# Patient Record
Sex: Male | Born: 1945 | Race: Black or African American | Hispanic: No | Smoking: Current some day smoker
Health system: Southern US, Community
[De-identification: ages and names within clinical notes are randomized; demographics above are authoritative.]

## PROBLEM LIST (undated history)

## (undated) DIAGNOSIS — D649 Anemia, unspecified: Secondary | ICD-10-CM

## (undated) DIAGNOSIS — IMO0002 Reserved for concepts with insufficient information to code with codable children: Secondary | ICD-10-CM

## (undated) DIAGNOSIS — Z87891 Personal history of nicotine dependence: Secondary | ICD-10-CM

## (undated) DIAGNOSIS — I10 Essential (primary) hypertension: Secondary | ICD-10-CM

## (undated) DIAGNOSIS — C3410 Malignant neoplasm of upper lobe, unspecified bronchus or lung: Secondary | ICD-10-CM

## (undated) DIAGNOSIS — J850 Gangrene and necrosis of lung: Secondary | ICD-10-CM

## (undated) DIAGNOSIS — K635 Polyp of colon: Secondary | ICD-10-CM

## (undated) DIAGNOSIS — G5603 Carpal tunnel syndrome, bilateral upper limbs: Secondary | ICD-10-CM

## (undated) DIAGNOSIS — F1011 Alcohol abuse, in remission: Secondary | ICD-10-CM

## (undated) DIAGNOSIS — R112 Nausea with vomiting, unspecified: Secondary | ICD-10-CM

## (undated) DIAGNOSIS — B449 Aspergillosis, unspecified: Secondary | ICD-10-CM

## (undated) DIAGNOSIS — J449 Chronic obstructive pulmonary disease, unspecified: Secondary | ICD-10-CM

## (undated) DIAGNOSIS — Z9889 Other specified postprocedural states: Secondary | ICD-10-CM

## (undated) DIAGNOSIS — K859 Acute pancreatitis without necrosis or infection, unspecified: Secondary | ICD-10-CM

## (undated) DIAGNOSIS — C61 Malignant neoplasm of prostate: Secondary | ICD-10-CM

## (undated) DIAGNOSIS — K219 Gastro-esophageal reflux disease without esophagitis: Secondary | ICD-10-CM

## (undated) DIAGNOSIS — I739 Peripheral vascular disease, unspecified: Secondary | ICD-10-CM

## (undated) HISTORY — DX: Chronic obstructive pulmonary disease, unspecified: J44.9

## (undated) HISTORY — DX: Peripheral vascular disease, unspecified: I73.9

## (undated) HISTORY — PX: LUNG SURGERY: SHX703

## (undated) HISTORY — PX: FOOT SURGERY: SHX648

## (undated) HISTORY — PX: OTHER SURGICAL HISTORY: SHX169

## (undated) HISTORY — PX: ANKLE ARTHROPLASTY: SUR68

## (undated) HISTORY — DX: Personal history of nicotine dependence: Z87.891

## (undated) HISTORY — PX: ANTERIOR CERVICAL DECOMP/DISCECTOMY FUSION: SHX1161

## (undated) HISTORY — DX: Polyp of colon: K63.5

## (undated) HISTORY — DX: Alcohol abuse, in remission: F10.11

## (undated) HISTORY — DX: Acute pancreatitis without necrosis or infection, unspecified: K85.90

## (undated) HISTORY — PX: VASCULAR SURGERY: SHX849

## (undated) HISTORY — PX: CARDIAC CATHETERIZATION: SHX172

## (undated) HISTORY — DX: Reserved for concepts with insufficient information to code with codable children: IMO0002

## (undated) HISTORY — DX: Essential (primary) hypertension: I10

---

## 1997-05-13 ENCOUNTER — Inpatient Hospital Stay (HOSPITAL_COMMUNITY): Admission: RE | Admit: 1997-05-13 | Discharge: 1997-05-14 | Payer: Self-pay | Admitting: Neurosurgery

## 1997-05-31 ENCOUNTER — Encounter: Admission: RE | Admit: 1997-05-31 | Discharge: 1997-05-31 | Payer: Self-pay | Admitting: Hematology and Oncology

## 1997-11-26 ENCOUNTER — Inpatient Hospital Stay (HOSPITAL_COMMUNITY): Admission: EM | Admit: 1997-11-26 | Discharge: 1997-11-28 | Payer: Self-pay | Admitting: Emergency Medicine

## 1997-11-26 ENCOUNTER — Encounter: Payer: Self-pay | Admitting: Surgery

## 1997-11-26 ENCOUNTER — Encounter: Payer: Self-pay | Admitting: *Deleted

## 1997-11-30 ENCOUNTER — Encounter: Payer: Self-pay | Admitting: Surgery

## 1997-11-30 ENCOUNTER — Ambulatory Visit (HOSPITAL_COMMUNITY): Admission: RE | Admit: 1997-11-30 | Discharge: 1997-11-30 | Payer: Self-pay | Admitting: Surgery

## 1998-03-31 ENCOUNTER — Encounter: Admission: RE | Admit: 1998-03-31 | Discharge: 1998-05-01 | Payer: Self-pay | Admitting: Neurosurgery

## 1998-06-03 ENCOUNTER — Encounter: Payer: Self-pay | Admitting: Neurosurgery

## 1998-06-03 ENCOUNTER — Ambulatory Visit (HOSPITAL_COMMUNITY): Admission: RE | Admit: 1998-06-03 | Discharge: 1998-06-03 | Payer: Self-pay | Admitting: Neurosurgery

## 1998-09-04 ENCOUNTER — Encounter: Payer: Self-pay | Admitting: Surgery

## 1998-09-04 ENCOUNTER — Ambulatory Visit (HOSPITAL_COMMUNITY): Admission: RE | Admit: 1998-09-04 | Discharge: 1998-09-04 | Payer: Self-pay | Admitting: Surgery

## 1998-09-11 ENCOUNTER — Encounter: Payer: Self-pay | Admitting: Surgery

## 1998-09-11 ENCOUNTER — Ambulatory Visit (HOSPITAL_COMMUNITY): Admission: RE | Admit: 1998-09-11 | Discharge: 1998-09-11 | Payer: Self-pay | Admitting: Surgery

## 1998-11-15 ENCOUNTER — Encounter: Admission: RE | Admit: 1998-11-15 | Discharge: 1999-02-13 | Payer: Self-pay | Admitting: Neurosurgery

## 1999-07-31 ENCOUNTER — Encounter: Admission: RE | Admit: 1999-07-31 | Discharge: 1999-07-31 | Payer: Self-pay | Admitting: Neurosurgery

## 1999-07-31 ENCOUNTER — Encounter: Payer: Self-pay | Admitting: Neurosurgery

## 1999-10-19 ENCOUNTER — Encounter: Admission: RE | Admit: 1999-10-19 | Discharge: 1999-10-19 | Payer: Self-pay | Admitting: Gastroenterology

## 1999-10-19 ENCOUNTER — Encounter: Payer: Self-pay | Admitting: Gastroenterology

## 1999-11-27 ENCOUNTER — Encounter: Payer: Self-pay | Admitting: Cardiology

## 1999-11-27 ENCOUNTER — Encounter: Admission: RE | Admit: 1999-11-27 | Discharge: 1999-11-27 | Payer: Self-pay | Admitting: Cardiology

## 2000-02-19 ENCOUNTER — Encounter: Admission: RE | Admit: 2000-02-19 | Discharge: 2000-02-19 | Payer: Self-pay | Admitting: Neurosurgery

## 2000-02-19 ENCOUNTER — Encounter: Payer: Self-pay | Admitting: Neurosurgery

## 2000-02-28 ENCOUNTER — Ambulatory Visit (HOSPITAL_COMMUNITY): Admission: RE | Admit: 2000-02-28 | Discharge: 2000-02-28 | Payer: Self-pay | Admitting: Neurosurgery

## 2000-02-28 ENCOUNTER — Encounter: Payer: Self-pay | Admitting: Neurosurgery

## 2000-11-28 ENCOUNTER — Encounter: Admission: RE | Admit: 2000-11-28 | Discharge: 2000-11-28 | Payer: Self-pay | Admitting: Cardiology

## 2000-11-28 ENCOUNTER — Encounter: Payer: Self-pay | Admitting: Cardiology

## 2000-12-29 ENCOUNTER — Emergency Department (HOSPITAL_COMMUNITY): Admission: EM | Admit: 2000-12-29 | Discharge: 2000-12-29 | Payer: Self-pay | Admitting: Emergency Medicine

## 2001-02-23 ENCOUNTER — Ambulatory Visit (HOSPITAL_COMMUNITY): Admission: RE | Admit: 2001-02-23 | Discharge: 2001-02-23 | Payer: Self-pay | Admitting: Cardiology

## 2001-02-23 ENCOUNTER — Encounter: Payer: Self-pay | Admitting: Cardiology

## 2001-12-08 ENCOUNTER — Encounter (HOSPITAL_BASED_OUTPATIENT_CLINIC_OR_DEPARTMENT_OTHER): Payer: Self-pay | Admitting: General Surgery

## 2001-12-10 ENCOUNTER — Encounter (INDEPENDENT_AMBULATORY_CARE_PROVIDER_SITE_OTHER): Payer: Self-pay | Admitting: Specialist

## 2001-12-10 ENCOUNTER — Ambulatory Visit (HOSPITAL_COMMUNITY): Admission: RE | Admit: 2001-12-10 | Discharge: 2001-12-10 | Payer: Self-pay | Admitting: General Surgery

## 2001-12-20 HISTORY — PX: EPIGASTRIC HERNIA REPAIR: SUR1177

## 2002-03-07 ENCOUNTER — Encounter: Payer: Self-pay | Admitting: Neurosurgery

## 2002-03-07 ENCOUNTER — Ambulatory Visit (HOSPITAL_COMMUNITY): Admission: RE | Admit: 2002-03-07 | Discharge: 2002-03-07 | Payer: Self-pay | Admitting: Neurosurgery

## 2002-03-23 ENCOUNTER — Encounter: Payer: Self-pay | Admitting: Neurosurgery

## 2002-03-23 ENCOUNTER — Encounter: Admission: RE | Admit: 2002-03-23 | Discharge: 2002-03-23 | Payer: Self-pay | Admitting: Neurosurgery

## 2002-04-06 ENCOUNTER — Encounter: Payer: Self-pay | Admitting: Neurosurgery

## 2002-04-06 ENCOUNTER — Encounter: Admission: RE | Admit: 2002-04-06 | Discharge: 2002-04-06 | Payer: Self-pay | Admitting: Neurosurgery

## 2002-04-23 ENCOUNTER — Encounter: Payer: Self-pay | Admitting: Cardiovascular Disease

## 2002-04-23 ENCOUNTER — Ambulatory Visit (HOSPITAL_COMMUNITY): Admission: RE | Admit: 2002-04-23 | Discharge: 2002-04-23 | Payer: Self-pay | Admitting: Cardiovascular Disease

## 2002-06-09 ENCOUNTER — Encounter: Payer: Self-pay | Admitting: Emergency Medicine

## 2002-06-09 ENCOUNTER — Emergency Department (HOSPITAL_COMMUNITY): Admission: EM | Admit: 2002-06-09 | Discharge: 2002-06-10 | Payer: Self-pay | Admitting: Emergency Medicine

## 2002-11-29 ENCOUNTER — Emergency Department (HOSPITAL_COMMUNITY): Admission: EM | Admit: 2002-11-29 | Discharge: 2002-11-29 | Payer: Self-pay | Admitting: Emergency Medicine

## 2002-12-20 ENCOUNTER — Encounter: Admission: RE | Admit: 2002-12-20 | Discharge: 2002-12-20 | Payer: Self-pay | Admitting: Gastroenterology

## 2002-12-20 ENCOUNTER — Encounter: Admission: RE | Admit: 2002-12-20 | Discharge: 2002-12-20 | Payer: Self-pay | Admitting: Oncology

## 2002-12-24 ENCOUNTER — Encounter: Admission: RE | Admit: 2002-12-24 | Discharge: 2002-12-24 | Payer: Self-pay | Admitting: Oncology

## 2002-12-24 ENCOUNTER — Encounter: Admission: RE | Admit: 2002-12-24 | Discharge: 2002-12-24 | Payer: Self-pay | Admitting: Gastroenterology

## 2003-04-26 ENCOUNTER — Ambulatory Visit (HOSPITAL_COMMUNITY): Admission: RE | Admit: 2003-04-26 | Discharge: 2003-04-26 | Payer: Self-pay | Admitting: Cardiology

## 2003-05-10 ENCOUNTER — Encounter: Admission: RE | Admit: 2003-05-10 | Discharge: 2003-05-31 | Payer: Self-pay | Admitting: Cardiology

## 2003-05-19 ENCOUNTER — Emergency Department (HOSPITAL_COMMUNITY): Admission: EM | Admit: 2003-05-19 | Discharge: 2003-05-19 | Payer: Self-pay | Admitting: Emergency Medicine

## 2003-06-24 ENCOUNTER — Ambulatory Visit (HOSPITAL_COMMUNITY): Admission: RE | Admit: 2003-06-24 | Discharge: 2003-06-25 | Payer: Self-pay | Admitting: Surgery

## 2003-09-09 ENCOUNTER — Emergency Department (HOSPITAL_COMMUNITY): Admission: EM | Admit: 2003-09-09 | Discharge: 2003-09-10 | Payer: Self-pay | Admitting: Emergency Medicine

## 2003-12-19 ENCOUNTER — Encounter: Admission: RE | Admit: 2003-12-19 | Discharge: 2003-12-19 | Payer: Self-pay | Admitting: Surgery

## 2004-02-23 ENCOUNTER — Encounter: Admission: RE | Admit: 2004-02-23 | Discharge: 2004-02-23 | Payer: Self-pay | Admitting: Cardiology

## 2004-04-17 ENCOUNTER — Encounter: Admission: RE | Admit: 2004-04-17 | Discharge: 2004-04-17 | Payer: Self-pay | Admitting: Cardiology

## 2004-10-12 ENCOUNTER — Encounter: Admission: RE | Admit: 2004-10-12 | Discharge: 2004-10-12 | Payer: Self-pay | Admitting: Cardiology

## 2005-01-28 DIAGNOSIS — C3410 Malignant neoplasm of upper lobe, unspecified bronchus or lung: Secondary | ICD-10-CM

## 2005-01-28 HISTORY — DX: Malignant neoplasm of upper lobe, unspecified bronchus or lung: C34.10

## 2005-02-21 ENCOUNTER — Ambulatory Visit (HOSPITAL_BASED_OUTPATIENT_CLINIC_OR_DEPARTMENT_OTHER): Admission: RE | Admit: 2005-02-21 | Discharge: 2005-02-21 | Payer: Self-pay | Admitting: Cardiology

## 2005-02-24 ENCOUNTER — Ambulatory Visit: Payer: Self-pay | Admitting: Internal Medicine

## 2006-03-02 ENCOUNTER — Inpatient Hospital Stay (HOSPITAL_COMMUNITY): Admission: EM | Admit: 2006-03-02 | Discharge: 2006-03-04 | Payer: Self-pay | Admitting: Emergency Medicine

## 2006-04-30 ENCOUNTER — Encounter: Admission: RE | Admit: 2006-04-30 | Discharge: 2006-05-22 | Payer: Self-pay | Admitting: Orthopedic Surgery

## 2006-05-06 ENCOUNTER — Ambulatory Visit (HOSPITAL_BASED_OUTPATIENT_CLINIC_OR_DEPARTMENT_OTHER): Admission: RE | Admit: 2006-05-06 | Discharge: 2006-05-06 | Payer: Self-pay | Admitting: Orthopedic Surgery

## 2006-05-23 ENCOUNTER — Encounter: Admission: RE | Admit: 2006-05-23 | Discharge: 2006-07-16 | Payer: Self-pay | Admitting: Orthopedic Surgery

## 2006-07-31 ENCOUNTER — Encounter: Admission: RE | Admit: 2006-07-31 | Discharge: 2006-07-31 | Payer: Self-pay | Admitting: Cardiology

## 2006-08-20 ENCOUNTER — Ambulatory Visit: Payer: Self-pay | Admitting: Internal Medicine

## 2006-08-21 ENCOUNTER — Encounter: Admission: RE | Admit: 2006-08-21 | Discharge: 2006-08-21 | Payer: Self-pay | Admitting: Cardiology

## 2006-08-26 LAB — CBC WITH DIFFERENTIAL/PLATELET
Basophils Absolute: 0 10*3/uL (ref 0.0–0.1)
Eosinophils Absolute: 0.3 10*3/uL (ref 0.0–0.5)
HGB: 13.7 g/dL (ref 13.0–17.1)
LYMPH%: 42 % (ref 14.0–48.0)
MCH: 31.6 pg (ref 28.0–33.4)
MCV: 90.4 fL (ref 81.6–98.0)
MONO%: 11.7 % (ref 0.0–13.0)
NEUT#: 1.4 10*3/uL — ABNORMAL LOW (ref 1.5–6.5)
Platelets: 168 10*3/uL (ref 145–400)
RBC: 4.33 10*6/uL (ref 4.20–5.71)

## 2006-08-26 LAB — COMPREHENSIVE METABOLIC PANEL
Alkaline Phosphatase: 131 U/L — ABNORMAL HIGH (ref 39–117)
BUN: 13 mg/dL (ref 6–23)
Glucose, Bld: 69 mg/dL — ABNORMAL LOW (ref 70–99)
Total Bilirubin: 0.5 mg/dL (ref 0.3–1.2)

## 2006-08-26 LAB — LACTATE DEHYDROGENASE: LDH: 159 U/L (ref 94–250)

## 2006-08-29 ENCOUNTER — Ambulatory Visit (HOSPITAL_COMMUNITY): Admission: RE | Admit: 2006-08-29 | Discharge: 2006-08-29 | Payer: Self-pay | Admitting: Internal Medicine

## 2006-09-17 LAB — CBC WITH DIFFERENTIAL/PLATELET
Basophils Absolute: 0.1 10*3/uL (ref 0.0–0.1)
HCT: 34.7 % — ABNORMAL LOW (ref 38.7–49.9)
HGB: 12.4 g/dL — ABNORMAL LOW (ref 13.0–17.1)
MCH: 33.1 pg (ref 28.0–33.4)
MONO#: 1 10*3/uL — ABNORMAL HIGH (ref 0.1–0.9)
NEUT%: 37.9 % — ABNORMAL LOW (ref 40.0–75.0)
lymph#: 1.8 10*3/uL (ref 0.9–3.3)

## 2006-09-19 ENCOUNTER — Ambulatory Visit (HOSPITAL_COMMUNITY): Admission: RE | Admit: 2006-09-19 | Discharge: 2006-09-19 | Payer: Self-pay | Admitting: Internal Medicine

## 2006-10-07 ENCOUNTER — Ambulatory Visit: Payer: Self-pay | Admitting: Thoracic Surgery

## 2006-10-21 ENCOUNTER — Ambulatory Visit: Payer: Self-pay | Admitting: Internal Medicine

## 2006-10-24 ENCOUNTER — Encounter: Payer: Self-pay | Admitting: Thoracic Surgery

## 2006-10-24 ENCOUNTER — Ambulatory Visit: Payer: Self-pay | Admitting: Thoracic Surgery

## 2006-10-24 ENCOUNTER — Inpatient Hospital Stay (HOSPITAL_COMMUNITY): Admission: RE | Admit: 2006-10-24 | Discharge: 2006-10-29 | Payer: Self-pay | Admitting: Thoracic Surgery

## 2006-11-05 ENCOUNTER — Encounter: Admission: RE | Admit: 2006-11-05 | Discharge: 2006-11-05 | Payer: Self-pay | Admitting: Thoracic Surgery

## 2006-11-05 ENCOUNTER — Ambulatory Visit: Payer: Self-pay | Admitting: Thoracic Surgery

## 2006-11-25 LAB — COMPREHENSIVE METABOLIC PANEL
Albumin: 4.1 g/dL (ref 3.5–5.2)
Alkaline Phosphatase: 118 U/L — ABNORMAL HIGH (ref 39–117)
BUN: 16 mg/dL (ref 6–23)
Glucose, Bld: 93 mg/dL (ref 70–99)
Total Bilirubin: 0.4 mg/dL (ref 0.3–1.2)

## 2006-11-25 LAB — CBC WITH DIFFERENTIAL/PLATELET
Basophils Absolute: 0 10*3/uL (ref 0.0–0.1)
Eosinophils Absolute: 0.6 10*3/uL — ABNORMAL HIGH (ref 0.0–0.5)
HGB: 11.7 g/dL — ABNORMAL LOW (ref 13.0–17.1)
LYMPH%: 32.7 % (ref 14.0–48.0)
MCV: 88.6 fL (ref 81.6–98.0)
MONO%: 10.4 % (ref 0.0–13.0)
NEUT#: 2.6 10*3/uL (ref 1.5–6.5)
Platelets: 191 10*3/uL (ref 145–400)

## 2006-11-26 ENCOUNTER — Encounter: Admission: RE | Admit: 2006-11-26 | Discharge: 2006-11-26 | Payer: Self-pay | Admitting: Thoracic Surgery

## 2006-11-26 ENCOUNTER — Ambulatory Visit: Payer: Self-pay | Admitting: Thoracic Surgery

## 2007-01-20 ENCOUNTER — Ambulatory Visit: Payer: Self-pay | Admitting: Thoracic Surgery

## 2007-01-20 ENCOUNTER — Encounter: Admission: RE | Admit: 2007-01-20 | Discharge: 2007-01-20 | Payer: Self-pay | Admitting: Thoracic Surgery

## 2007-04-21 ENCOUNTER — Ambulatory Visit: Payer: Self-pay | Admitting: Thoracic Surgery

## 2007-04-21 ENCOUNTER — Encounter: Admission: RE | Admit: 2007-04-21 | Discharge: 2007-04-21 | Payer: Self-pay | Admitting: Thoracic Surgery

## 2007-07-22 ENCOUNTER — Encounter: Admission: RE | Admit: 2007-07-22 | Discharge: 2007-07-22 | Payer: Self-pay | Admitting: Thoracic Surgery

## 2007-10-07 ENCOUNTER — Ambulatory Visit: Payer: Self-pay | Admitting: Thoracic Surgery

## 2007-10-07 ENCOUNTER — Encounter: Admission: RE | Admit: 2007-10-07 | Discharge: 2007-10-07 | Payer: Self-pay | Admitting: Thoracic Surgery

## 2007-12-11 ENCOUNTER — Encounter: Admission: RE | Admit: 2007-12-11 | Discharge: 2007-12-11 | Payer: Self-pay | Admitting: Cardiology

## 2008-04-20 ENCOUNTER — Ambulatory Visit: Payer: Self-pay | Admitting: Thoracic Surgery

## 2008-04-20 ENCOUNTER — Encounter: Admission: RE | Admit: 2008-04-20 | Discharge: 2008-04-20 | Payer: Self-pay | Admitting: Thoracic Surgery

## 2008-11-16 ENCOUNTER — Encounter: Admission: RE | Admit: 2008-11-16 | Discharge: 2008-11-16 | Payer: Self-pay | Admitting: Thoracic Surgery

## 2008-11-16 ENCOUNTER — Ambulatory Visit: Payer: Self-pay | Admitting: Thoracic Surgery

## 2009-04-19 ENCOUNTER — Encounter: Admission: RE | Admit: 2009-04-19 | Discharge: 2009-04-19 | Payer: Self-pay | Admitting: Cardiology

## 2009-05-02 ENCOUNTER — Encounter (INDEPENDENT_AMBULATORY_CARE_PROVIDER_SITE_OTHER): Payer: Self-pay | Admitting: Cardiology

## 2009-05-02 ENCOUNTER — Ambulatory Visit: Payer: Self-pay | Admitting: Vascular Surgery

## 2009-05-02 ENCOUNTER — Ambulatory Visit (HOSPITAL_COMMUNITY): Admission: RE | Admit: 2009-05-02 | Discharge: 2009-05-02 | Payer: Self-pay | Admitting: Cardiology

## 2009-05-16 ENCOUNTER — Ambulatory Visit: Payer: Self-pay | Admitting: Thoracic Surgery

## 2009-05-16 ENCOUNTER — Encounter: Admission: RE | Admit: 2009-05-16 | Discharge: 2009-05-16 | Payer: Self-pay | Admitting: Thoracic Surgery

## 2009-11-17 ENCOUNTER — Encounter: Admission: RE | Admit: 2009-11-17 | Discharge: 2009-11-17 | Payer: Self-pay | Admitting: Thoracic Surgery

## 2009-11-17 ENCOUNTER — Ambulatory Visit: Payer: Self-pay | Admitting: Thoracic Surgery

## 2009-12-15 ENCOUNTER — Ambulatory Visit: Payer: Self-pay | Admitting: Vascular Surgery

## 2010-01-04 ENCOUNTER — Ambulatory Visit (HOSPITAL_COMMUNITY)
Admission: RE | Admit: 2010-01-04 | Discharge: 2010-01-04 | Payer: Self-pay | Source: Home / Self Care | Attending: Vascular Surgery | Admitting: Vascular Surgery

## 2010-01-12 ENCOUNTER — Ambulatory Visit: Payer: Self-pay | Admitting: Vascular Surgery

## 2010-02-17 ENCOUNTER — Encounter: Payer: Self-pay | Admitting: Oncology

## 2010-02-19 ENCOUNTER — Encounter: Payer: Self-pay | Admitting: Thoracic Surgery

## 2010-03-02 ENCOUNTER — Ambulatory Visit: Admit: 2010-03-02 | Payer: Self-pay | Admitting: Vascular Surgery

## 2010-03-02 ENCOUNTER — Ambulatory Visit: Payer: Self-pay | Admitting: Vascular Surgery

## 2010-04-03 ENCOUNTER — Emergency Department (HOSPITAL_COMMUNITY): Payer: Medicare Other

## 2010-04-03 ENCOUNTER — Emergency Department (HOSPITAL_COMMUNITY)
Admission: EM | Admit: 2010-04-03 | Discharge: 2010-04-03 | Discharge status: Against Medical Advice | Payer: Medicare Other | Attending: Emergency Medicine | Admitting: Emergency Medicine

## 2010-04-03 DIAGNOSIS — R079 Chest pain, unspecified: Secondary | ICD-10-CM | POA: Insufficient documentation

## 2010-04-03 LAB — CBC
HCT: 36.9 % — ABNORMAL LOW (ref 39.0–52.0)
Hemoglobin: 12.9 g/dL — ABNORMAL LOW (ref 13.0–17.0)
MCH: 31.2 pg (ref 26.0–34.0)
MCHC: 35 g/dL (ref 30.0–36.0)
MCV: 89.3 fL (ref 78.0–100.0)
Platelets: 82 K/uL — ABNORMAL LOW (ref 150–400)
RBC: 4.13 MIL/uL — ABNORMAL LOW (ref 4.22–5.81)
RDW: 15.2 % (ref 11.5–15.5)
WBC: 4.9 K/uL (ref 4.0–10.5)

## 2010-04-03 LAB — POCT CARDIAC MARKERS
CKMB, poc: <1 ng/mL — ABNORMAL LOW (ref 1.0–8.0)
Myoglobin, poc: 191 ng/mL (ref 12–200)
Troponin i, poc: <0.05 ng/mL (ref 0.00–0.09)

## 2010-04-03 LAB — DIFFERENTIAL
Basophils Absolute: 0 10*3/uL (ref 0.0–0.1)
Basophils Relative: 1 % (ref 0–1)
Eosinophils Absolute: 0.5 K/uL (ref 0.0–0.7)
Eosinophils Relative: 10 % — ABNORMAL HIGH (ref 0–5)
Lymphocytes Relative: 27 % (ref 12–46)
Lymphs Abs: 1.3 K/uL (ref 0.7–4.0)
Monocytes Absolute: 0.6 10*3/uL (ref 0.1–1.0)
Monocytes Relative: 13 % — ABNORMAL HIGH (ref 3–12)
Neutro Abs: 2.4 K/uL (ref 1.7–7.7)
Neutrophils Relative %: 49 % (ref 43–77)

## 2010-04-03 LAB — BASIC METABOLIC PANEL
CO2: 32 mEq/L (ref 19–32)
Calcium: 9.7 mg/dL (ref 8.4–10.5)
Chloride: 94 mEq/L — ABNORMAL LOW (ref 96–112)
Creatinine, Ser: 1.56 mg/dL — ABNORMAL HIGH (ref 0.4–1.5)
GFR calc Af Amer: 55 mL/min — ABNORMAL LOW (ref >60)
Glucose, Bld: 122 mg/dL — ABNORMAL HIGH (ref 70–99)
Sodium: 137 mEq/L (ref 135–145)

## 2010-04-03 LAB — BASIC METABOLIC PANEL WITH GFR
BUN: 12 mg/dL (ref 6–23)
GFR calc non Af Amer: 45 mL/min — ABNORMAL LOW (ref >60)
Potassium: 3.7 meq/L (ref 3.5–5.1)

## 2010-04-09 LAB — POCT I-STAT, CHEM 8
Calcium, Ion: 1.19 mmol/L (ref 1.12–1.32)
Chloride: 105 mEq/L (ref 96–112)
Creatinine, Ser: 1.8 mg/dL — ABNORMAL HIGH (ref 0.4–1.5)
Glucose, Bld: 98 mg/dL (ref 70–99)
HCT: 36 % — ABNORMAL LOW (ref 39.0–52.0)
Potassium: 4.4 mEq/L (ref 3.5–5.1)

## 2010-04-13 ENCOUNTER — Ambulatory Visit (INDEPENDENT_AMBULATORY_CARE_PROVIDER_SITE_OTHER): Payer: Medicare Other | Admitting: Vascular Surgery

## 2010-04-13 DIAGNOSIS — I70219 Atherosclerosis of native arteries of extremities with intermittent claudication, unspecified extremity: Secondary | ICD-10-CM

## 2010-04-16 ENCOUNTER — Other Ambulatory Visit: Payer: Self-pay | Admitting: Thoracic Surgery

## 2010-04-16 DIAGNOSIS — C3490 Malignant neoplasm of unspecified part of unspecified bronchus or lung: Secondary | ICD-10-CM

## 2010-04-16 NOTE — Assessment & Plan Note (Signed)
OFFICE VISIT  [name] [name], [name] [name] DOB:  1945-04-07                                       04/13/2010 ZOXWR#:[id number]  This gentleman is an established patient.  HISTORY OF PRESENT ILLNESS:  A 65 year old gentleman that I have previously taken to the angio suite and studied his right leg.  He unfortunately has vessels in his below the knee segment that are too small to be used for a successful bypass.  He has occluded right superficial femoral artery and he was going to consider the possibility of a possible right common femoral artery endarterectomy and extended profundoplasty to try to improve the flow to his collaterals. Unfortunately he is not a candidate for direct revascularization given the small target vessel.  At this point he feels his symptomatology is severe enough that he is not able to complete his activities of daily living.  He has very limited distance that he can walk before he gets claudication in his right greater than his left leg.  His past medical history, past surgical history, social history, family history, medications, allergies and review of systems are unchanged from previous.  PHYSICAL EXAMINATION:  Vital signs:  His blood pressure is 168/91, heart rate of 69, respirations were 12, satting 99%. General:  Well-developed, well-nourished, no apparent distress. HEENT:  Pupils were equal, round, reactive to light.  Extraocular movements were intact. Neck:  Supple without any nuchal rigidity.  No JVD.  Lungs:  Clear to auscultation bilaterally.  No rales, rhonchi or wheezing. Cardiac:  Regular rate and rhythm.  Normal S1-S2.  No murmurs, rubs, thrills or gallops. Vascular:  Easily palpable upper extremity pulses.  Carotids were palpable without any bruits.  Abdominal aorta could not appreciate a midline mass that was pulsatile.  His femorals were palpable, left greater than right but still palpable.  No popliteal or pedal  pulses were palpable on either side. Abdomen:  Soft abdomen, nontender, nondistended.  No guarding, no rebound, no hepatosplenomegaly. Musculoskeletal:  He has 5/5 strength at all extremities.  There is no frank gangrene or ulcers on either side.  No frank cyanosis. Neuro:  Cranial nerves II-XII were intact.  Sensation was intact grossly.  Motor strength was as listed above.  MEDICAL DECISION MAKING:  This is a 65 year old gentleman with bilateral peripheral arterial disease, intermittent claudication.  He is now at the point where he has lifestyle limiting claudication.  Actually he has had lifestyle limiting claudication for quite awhile now.  Unfortunately he had an elevated creatinine previously which prevented me from studying both legs.  On reviewing his iliac system I can tell that there are persistent disease throughout the iliac system and that in fact what this gentleman needs is an iliofemoral endarterectomy with possible stenting of his iliac system.  This is done as a hybrid procedure.  He will also need an extended profundoplasty as there appears to be a lesion extending down into the profunda.  As this is the main conduit feeding his leg this needs to be completed.  In regards to his left leg it needs to also be imaged and there is a good possibility there is extensive amount of disease also in his left leg mirroring the right side.  In fact, he may actually benefit from an aortobifemoral bypass to improve the inflow to both sides but upon brief discussion of this the  patient absolutely refuses any consideration of aortobifemoral.  He is at this point mainly symptomatic on his right leg though he does have occasional pain in his left leg so he would like to proceed with a more limited procedure at this point.  He has a followup appointment with Dr. Shana Chute on March 22 so we are going to try to arrange for a right iliofemoral endarterectomy, profundoplasty and possible  iliac artery stenting on April 18, pending Dr. Magda Kiel input.    Fransisco Hertz, MD Electronically Signed  BLC/MEDQ  D:  04/13/2010  T:  04/16/2010  Job:  [id number]

## 2010-05-10 ENCOUNTER — Other Ambulatory Visit (HOSPITAL_COMMUNITY): Payer: Medicare Other

## 2010-05-14 ENCOUNTER — Encounter (HOSPITAL_COMMUNITY)
Admission: RE | Admit: 2010-05-14 | Discharge: 2010-05-14 | Disposition: A | Discharge status: Home / Self Care | Payer: Medicare Other | Source: Ambulatory Visit | Attending: Vascular Surgery | Admitting: Vascular Surgery

## 2010-05-14 ENCOUNTER — Other Ambulatory Visit: Payer: Self-pay | Admitting: Vascular Surgery

## 2010-05-14 ENCOUNTER — Ambulatory Visit (HOSPITAL_COMMUNITY)
Admission: RE | Admit: 2010-05-14 | Discharge: 2010-05-14 | Disposition: A | Discharge status: Home / Self Care | Payer: Medicare Other | Source: Ambulatory Visit | Attending: Vascular Surgery | Admitting: Vascular Surgery

## 2010-05-14 DIAGNOSIS — J4489 Other specified chronic obstructive pulmonary disease: Secondary | ICD-10-CM | POA: Insufficient documentation

## 2010-05-14 DIAGNOSIS — J449 Chronic obstructive pulmonary disease, unspecified: Secondary | ICD-10-CM | POA: Insufficient documentation

## 2010-05-14 DIAGNOSIS — Z01811 Encounter for preprocedural respiratory examination: Secondary | ICD-10-CM | POA: Insufficient documentation

## 2010-05-14 DIAGNOSIS — I739 Peripheral vascular disease, unspecified: Secondary | ICD-10-CM

## 2010-05-14 DIAGNOSIS — R0602 Shortness of breath: Secondary | ICD-10-CM | POA: Insufficient documentation

## 2010-05-14 DIAGNOSIS — Z01810 Encounter for preprocedural cardiovascular examination: Secondary | ICD-10-CM | POA: Insufficient documentation

## 2010-05-14 DIAGNOSIS — Z01812 Encounter for preprocedural laboratory examination: Secondary | ICD-10-CM | POA: Insufficient documentation

## 2010-05-14 LAB — SURGICAL PCR SCREEN: Staphylococcus aureus: NEGATIVE

## 2010-05-14 LAB — URINALYSIS, ROUTINE W REFLEX MICROSCOPIC
Nitrite: NEGATIVE
Specific Gravity, Urine: 1.011 (ref 1.005–1.030)
Urobilinogen, UA: 0.2 mg/dL (ref 0.0–1.0)
pH: 6 (ref 5.0–8.0)

## 2010-05-14 LAB — COMPREHENSIVE METABOLIC PANEL
ALT: 19 U/L (ref 0–53)
AST: 52 U/L — ABNORMAL HIGH (ref 0–37)
Albumin: 3.9 g/dL (ref 3.5–5.2)
Alkaline Phosphatase: 83 U/L (ref 39–117)
Calcium: 9.5 mg/dL (ref 8.4–10.5)
GFR calc Af Amer: 56 mL/min — ABNORMAL LOW (ref >60)
Potassium: 4.1 mEq/L (ref 3.5–5.1)
Sodium: 137 mEq/L (ref 135–145)
Total Protein: 7.2 g/dL (ref 6.0–8.3)

## 2010-05-14 LAB — CBC
HCT: 33 % — ABNORMAL LOW (ref 39.0–52.0)
Hemoglobin: 11.8 g/dL — ABNORMAL LOW (ref 13.0–17.0)
MCH: 31.9 pg (ref 26.0–34.0)
MCHC: 35.8 g/dL (ref 30.0–36.0)
RDW: 15.3 % (ref 11.5–15.5)

## 2010-05-16 ENCOUNTER — Inpatient Hospital Stay (HOSPITAL_COMMUNITY): Payer: Medicare Other

## 2010-05-16 ENCOUNTER — Inpatient Hospital Stay (HOSPITAL_COMMUNITY)
Admission: RE | Admit: 2010-05-16 | Discharge: 2010-05-21 | DRG: 237 | Disposition: A | Discharge status: Home / Self Care | Payer: Medicare Other | Source: Ambulatory Visit | Attending: Vascular Surgery | Admitting: Vascular Surgery

## 2010-05-16 ENCOUNTER — Other Ambulatory Visit: Payer: Self-pay | Admitting: Vascular Surgery

## 2010-05-16 DIAGNOSIS — Y849 Medical procedure, unspecified as the cause of abnormal reaction of the patient, or of later complication, without mention of misadventure at the time of the procedure: Secondary | ICD-10-CM | POA: Diagnosis present

## 2010-05-16 DIAGNOSIS — H409 Unspecified glaucoma: Secondary | ICD-10-CM | POA: Diagnosis present

## 2010-05-16 DIAGNOSIS — Z23 Encounter for immunization: Secondary | ICD-10-CM

## 2010-05-16 DIAGNOSIS — J189 Pneumonia, unspecified organism: Secondary | ICD-10-CM | POA: Diagnosis not present

## 2010-05-16 DIAGNOSIS — I70219 Atherosclerosis of native arteries of extremities with intermittent claudication, unspecified extremity: Principal | ICD-10-CM | POA: Diagnosis present

## 2010-05-16 DIAGNOSIS — T82898A Other specified complication of vascular prosthetic devices, implants and grafts, initial encounter: Secondary | ICD-10-CM | POA: Diagnosis present

## 2010-05-16 DIAGNOSIS — Y9210 Unspecified residential institution as the place of occurrence of the external cause: Secondary | ICD-10-CM | POA: Diagnosis not present

## 2010-05-16 DIAGNOSIS — IMO0002 Reserved for concepts with insufficient information to code with codable children: Secondary | ICD-10-CM | POA: Diagnosis not present

## 2010-05-16 DIAGNOSIS — I10 Essential (primary) hypertension: Secondary | ICD-10-CM | POA: Diagnosis present

## 2010-05-16 DIAGNOSIS — Z79899 Other long term (current) drug therapy: Secondary | ICD-10-CM

## 2010-05-16 DIAGNOSIS — I2510 Atherosclerotic heart disease of native coronary artery without angina pectoris: Secondary | ICD-10-CM | POA: Diagnosis present

## 2010-05-16 DIAGNOSIS — D649 Anemia, unspecified: Secondary | ICD-10-CM | POA: Diagnosis not present

## 2010-05-16 DIAGNOSIS — Z7982 Long term (current) use of aspirin: Secondary | ICD-10-CM

## 2010-05-17 DIAGNOSIS — Z48812 Encounter for surgical aftercare following surgery on the circulatory system: Secondary | ICD-10-CM

## 2010-05-17 DIAGNOSIS — I739 Peripheral vascular disease, unspecified: Secondary | ICD-10-CM

## 2010-05-17 LAB — CBC
HCT: 21.6 % — ABNORMAL LOW (ref 39.0–52.0)
Hemoglobin: 7.5 g/dL — ABNORMAL LOW (ref 13.0–17.0)
MCH: 31.1 pg (ref 26.0–34.0)
MCV: 89.6 fL (ref 78.0–100.0)
RBC: 2.41 MIL/uL — ABNORMAL LOW (ref 4.22–5.81)

## 2010-05-17 LAB — BASIC METABOLIC PANEL
CO2: 26 mEq/L (ref 19–32)
Chloride: 109 mEq/L (ref 96–112)
Creatinine, Ser: 1.83 mg/dL — ABNORMAL HIGH (ref 0.4–1.5)
GFR calc Af Amer: 46 mL/min — ABNORMAL LOW (ref >60)
Glucose, Bld: 97 mg/dL (ref 70–99)

## 2010-05-18 ENCOUNTER — Inpatient Hospital Stay (HOSPITAL_COMMUNITY): Payer: Medicare Other

## 2010-05-18 LAB — CROSSMATCH
Antibody Screen: NEGATIVE
Unit division: 0

## 2010-05-18 LAB — URINALYSIS, ROUTINE W REFLEX MICROSCOPIC
Glucose, UA: NEGATIVE mg/dL
Specific Gravity, Urine: 1.007 (ref 1.005–1.030)
pH: 7 (ref 5.0–8.0)

## 2010-05-18 LAB — CBC
Platelets: 101 10*3/uL — ABNORMAL LOW (ref 150–400)
RBC: 3.13 MIL/uL — ABNORMAL LOW (ref 4.22–5.81)
WBC: 6.4 10*3/uL (ref 4.0–10.5)

## 2010-05-18 LAB — BASIC METABOLIC PANEL
Chloride: 110 mEq/L (ref 96–112)
GFR calc Af Amer: 54 mL/min — ABNORMAL LOW (ref >60)
Potassium: 3.6 mEq/L (ref 3.5–5.1)

## 2010-05-20 LAB — EXPECTORATED SPUTUM ASSESSMENT W GRAM STAIN, RFLX TO RESP C

## 2010-05-21 NOTE — Op Note (Signed)
NAME:  [name] [name], [name] [name]               ACCOUNT NO.:  [id number]  MEDICAL RECORD NO.:  [id number]           PATIENT TYPE:  I  LOCATION:  3310                         FACILITY:  [facility]  PHYSICIAN:  Fransisco Hertz, MD       DATE OF BIRTH:  1945/12/12  DATE OF PROCEDURE:  05/16/2010 DATE OF DISCHARGE:                              OPERATIVE REPORT   PROCEDURE: 1. Right iliofemoral endarterectomy with bovine patch angioplasty. 2. Right profundoplasty. 3. Right external iliac artery and common iliac artery angioplasty. 4. Right leg angiogram.  PREOPERATIVE DIAGNOSIS:  Severe lifestyle-limiting claudication.  POSTOPERATIVE DIAGNOSIS:  Severe lifestyle-limiting claudication.  SURGEON:  Fransisco Hertz, MD.  ASSISTANTS: 1. Pecola Leisure, PA-C 2. Newton Pigg, PA-C.  ANESTHESIA:  General.  FINDINGS: 1. 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 2. Greatly improved iliac and femoral flow on completion angiogram     with less than 30% in-stent restenosis. 3. There was a widely patent below-the-knee popliteal artery     reconstituted off of the profunda branches, which unfortunately is     only about 2-2.5 mm in diameter with intact trifurcation. 4. There is patent right peroneal and posterior tibial flow to the     foot on completion of the angiogram.  SPECIMEN:  Iliofemoral plaque, which was sent to Pathology.  ESTIMATED BLOOD LOSS:  600 mL.  FLUIDS:  2500 mL of crystalloid and 500 mL of Hespan.  INDICATIONS:  This is a 65 year old gentleman that had lifestyle limiting claudication.  It was severe enough that even short distances resulted in severe pain in bilateral calves, right greater than left.  I previously performed an angiogram on him and unfortunatelyhis distal  target vessels were so small that I think that any bypass would be unlikely  to be successful in the long-term.  The only thing I felt that may be  beneficial  to him was a possible extensive iliofemoral endarterectomy as he did evidence of disease extending into the profunda artery, which is his only conduit for feeding his lower leg.  I had suggested that I would possibly consider imaging his left leg to consider whether he needs in fact an aortobifemoral to improve inflow bilaterally, but he did not want this extensive of a procedure, so he agreed to proceed forward with a right iliofemoral endarterectomy with profundoplasty in a angioplasty on his iliac system on the right side.  He is aware of the risks of this procedure, included bleeding, infection, possible MI, possible stroke, possible death due to these two previous risks, possible development of renal failure secondary to the dye load, possible nerve damage and possible risk of embolization, possible risk of dissection.   He is aware of these risks and agreed to proceed forward with such.  DESCRIPTION OF OPERATION:  After full informed written consent was obtained from the patient, he was brought back to the operating room and placed supine upon the operating table.  Prior to induction, he had received IV antibiotics.  He was then prepped and draped in the standard  fashion for a right leg bypass procedure.  I turned my attention first to his right groin where I made a longitudinal incision from the inguinal ligament down to where I thought his femoral bifurcation was.  Using blunt  dissection and electrocautery, I dissected out the common femoral artery up to the external iliac artery underneath the inguinal ligament.  Note, there was extensive amount of scar tissue throughout, and his bifurcation was in fact much lower than I anticipated.  He has a very long common femoral artery.  I extended the incision, so I could dissect the femoral down to the profunda.  There was extensive amount of calcification throughout these vessels including the profunda arteries.  I  dissected out three branches of the profunda and then controlled them with vessel loops and also dissected out the SFA, which was controlled with a vessel loop.  Multiple circumflex side branches were dissected out and controlled with vessel loops.  There are also some large prominent collaterals that were dissected out and controlled with ties.  We carried the dissection proximally underneath the inguinal ligament and found a softer area in the distal external iliac artery.  I felt that at this point it could be clamped at this location.  The crossing vein that was present here was ligated to facilitate this process.  I gave the patient 5000 units of heparin intravenously, which was a therapeutic anticoagulation dose.  At this point, I waited 3 minutes and then we made an incision in the common femoral artery with an 11 blade.  The arteriotomy was  extended proximally and distally with Potts scissor.  We extended the proximal extent of this arteriotomy up to the external iliac artery and then distally was extended down to the femoral bifurcation.  There was a calcified plaque that was in then the majority of this patient's lumen. Using a Cytogeneticist, I dissected out the central portion of plaque and dissected it first proximally and then transected the midportion of this plaque to help the mobilization of this plaque.  We were able to dissect this plaque up to the point where the iliac artery softened.  I was able to extract most of this calcified plaque without any difficulties then carried the dissection down into the SFA and into the profunda artery.  I was able to extract a plaque out of the trifurcation of the profunda artery that removed most of this calcified lesion.  At this point, we spent the next 30 minutes dissecting out intimal flaps.  Note, there was extensive amount of debris that was densely adherent to the wall.  After removing this plaque, the wall was quite  thinned out, and I had concerns with extraction of some of these more densely adherent pieces of plaque that have damaged already attenuated wall.  So at this point, I re-interrogated from the external iliac all the way down to the femoral bifurcation, extracted some intimal flap from the superficial femoral artery.  There was a small amount of backbleeding that was quite limited from the SFA.  I also dissected down into the first order profunda branches.  I extended the arteriotomy down into this first order, opened it up completely, and extracted as much intimal flap was I felt I could safely extract.  There was still some densely adherent plaque present in one out of the three profunda branches.  Due to the small size, less than 2 mm of these branches and a very attenuated wall, I did not  feel it was safe to dissect further down into this artery.  We extracted as intimal flaps and also atherosclerotic plaque as I deemed safe,  I allowed all branches to back bleed and there was vigorous back bleeding from  all branches at this point of the case.  At this point, I irrigated out the  entire common femoral artery and distal external iliac artery one final time,  and there was no more loose flaps.  The entirety of this arteriotomy was then  patched with a bovine pericardial patch.  Due to the extreme length of this arteriotomy,  I had to cut the patch in half and sewed together two pieces.  I first sewed  the top half from the external iliac artery down to about mid common femoral  artery and then transected the distal end of this patch to facilitate joining  it with the distal end.  I then took the distal bovine pericardial patch and sewed it from the bifurcation of the first order profunda branches, sewed it along the SFA orifice and also the common femoral artery, and brought this patch angioplasty all the way onto the common femoral artery.  This was then sewn together to the previously  placed more proximal patch.  Prior to completing this anastomosis, I allowed all arteries to back bleed, and then I completed the patch angioplasty in the usual fashion and  released all clamps.  There was some bleeding from an injury to the external iliac artery that had to be repaired with a 6-0 Prolene, and I also found one area of injury near the bifurcation of the profunda branches that was repaired with a 7-0 Prolene.  Thrombin and Gelfoam were applied throughout this wound.  At this point, then I cannulated the midportion of this common femoral artery with a micropuncture needle and passed the microwire up into it.  I then performed some limited hand injections, which demonstrated the presence of the common iliac and external iliac stents in this patient, and it also demonstrated there was greater than 75% in-stent restenosis of the distal common iliac artery and also the majority of the external iliac artery.  At this point, I placed a J-wire through the micropuncture sheath and advanced it into the aorta.  The micropuncture sheath was then exchanged for a 7-French sheath.  I then obtained a 6 mm x 40 mm angioplasty balloon and used this to treat the in-stent restenosis along the entire length from the mid common iliac artery down into the patch portion of the artery.  Based on the size of the inflated balloon in the stent, the stent is in fact an 8-mm stent.  I completed a hand injection, which demonstrated still a greater the 30% in-stent restenosis, but greatly improved lumen.  I obtained a 7 mm x 40 mm angioplasty balloon and  then used it to angioplasty the same locations as previous from mid common  iliac artery all the way down into the patched artery.  The balloon was then  deflated and removed.  Completion hand injections demonstrated a less than  30% in-stent restenosis and improvement of the distal external iliac artery  stenosis.  I then completed completion right leg  angiograms.  This demonstrated widely patent profunda flow with also reconstitution of a below-the-knee popliteal via profunda branches and intact trifurcation. There was also patent right peroneal and posterior tibial blood flow on the completion angiogram, though there is not any in-line flow as expected given this  patient occluded SFA.  Also of note, the patient's below-the-knee popliteal artery is only about 2-2.5 mm in diameter.  At this point, then I placed an end-hole catheter over the J-wire and reconstituted the tip and removed the wire with the J-wire.  At this point, I repaired the hole caused by the 7-French sheath with an interrupted stitch of 6-0 Prolene.  There was no bleeding from this needle puncture and sheath site.  At this point, the patient was given a total of 30 mg of protamine, as this patient was given a total of 9000 units of heparin intravenously to maintain anticoagulation throughout this case.   I also placed thrombin and Gelfoam throughout this wound, and then some venous bleeders were controlled with titanium clips and also electrocautery. Eventually, we achieved adequate hemostasis except for some vasa vasorum bleeding, which I did not feel could be treated with electrocautery, so I left one single piece of thrombin and Gelfoam down over this area of vasa vasorum bleeding.  There was, however, no active arterial bleeding in this wound.  I also inspected the femoral nerve which was identified  adjacent to the profunda branches.  It was noted to be intact with no  obvious evidence of injury.  The wound was then reapproximated with a  double layer of 2- Vicryl in the deep layer then a double layer of 3-0 Vicryl  in the subcutaneous tissue, and then the skin was closed with running subcuticular 4-0 Monocryl, and then the skin closure reinforced with Dermabond.  COMPLICATIONS:  None.  CONDITION:  Stable.     Fransisco Hertz, MD     BLC/MEDQ  D:   05/16/2010  T:  05/17/2010  Job:  [id number]  Electronically Signed by Leonides Sake MD on 05/21/2010 10:33:59 AM

## 2010-05-22 ENCOUNTER — Ambulatory Visit: Payer: Medicare Other | Admitting: Thoracic Surgery

## 2010-05-22 ENCOUNTER — Other Ambulatory Visit: Payer: Medicare Other

## 2010-05-22 NOTE — Discharge Summary (Signed)
NAME:  [name] [name], [name] [name] NO.:  [id number]  MEDICAL RECORD NO.:  [id number]           PATIENT TYPE:  I  LOCATION:  2004                         FACILITY:  [facility]  PHYSICIAN:  Fransisco Hertz, MD       DATE OF BIRTH:  1946/01/26  DATE OF ADMISSION:  05/16/2010 DATE OF DISCHARGE:  05/21/2010                              DISCHARGE SUMMARY   ADMISSION DIAGNOSIS:  Right lower extremity claudication.  PAST MEDICAL HISTORY/DISCHARGE DIAGNOSES: 1. Right lower extremity life-limiting claudication, status post right     iliofemoral endarterectomy with bovine patch angioplasty, right     profundoplasty, right external iliac artery and common iliac artery     angioplasty, and right leg angiogram. 2. Glaucoma. 3. Peripheral arterial disease. 4. History of recurrent pancreatitis. 5. Postoperative anemia, status post transfusion, stable. 6. Postoperative pneumonia, currently on antibiotics.  BRIEF HISTORY OF PRESENT ILLNESS:  The patient is a 65 year old African American male who has been followed by Dr. Imogene Burn for life-limiting claudication. Right side is greater than left.  Dr. Imogene Burn had an arteriogram obtained and unfortunately his distal target was too small to handle a successful bypass.  Dr. Imogene Burn felt that the only thing that may be beneficial to the patient was extensive iliofemoral endarterectomy as he had evidence of disease extending into profunda which was his only conduit for feeding his lower leg.  The patient agreed to proceed.  HOSPITAL COURSE:  The patient was admitted and taken to the OR on May 16, 2010 for a right iliofemoral endarterectomy with bovine patch angioplasty, right profundoplasty, right external iliac and common iliac artery angioplasty, and right leg angiogram.  The patient tolerated the procedure well and was hemodynamically stable immediately postoperatively.  He was transferred from the OR to the Postanesthesia Care Unit in stable condition.   The patient was extubated without complication and woke up from anesthesia neurologically intact.  The patient's postoperative course was complicated by  anemia for which he was subsequently transfused with 2 units of packed RBCs.  He was also started on iron supplementation.  He tolerated this well and his hemoglobin/hematocrit responded appropriately.  The patient's Foley was discontinued and he was able to void without difficulty.  A physical therapy consult was obtained, however, the patient was ambulating well and there were no PT needs identified.  ABIs were performed on May 17, 2010 and this revealed 0.48 on the right and 0.60 on the left.  The ABIs were not surprising as the patient fundamentally does not have any in-line flow with his distal targets being very small.  The remainder of the patient's postoperative course progressed as expected with the exception of fevers that were noted on postoperative day #3.  Chest x-ray was obtained and this revealed possible early pneumonia.  The patient was started on IV Cipro.  This was subsequently changed to p.o. Levaquin for discharge home.  The patient's temperatures steadily declined.  On May 21, 2010, the patient was anxious to go home. VITAL SIGNS:  He was afebrile with stable vital signs. CARDIAC:  Regular rate and rhythm. LUNGS:  Clear to auscultation.  EXTREMITIES:  Right groin incision revealed minimal lateral erythema, minimal edema, and no drainage.  Bilateral lower extremities are warm.  The patient was afebrile, ambulating well, and without complaint.  He was felt stable for discharge home at that time on p.o. antibiotics.  LABORATORY DATA:  CBC on May 18, 2010, white count 6.4, hematocrit 9.8, hematocrit 27.7, and platelets 101.  BMP on May 18, 2010, sodium 140, potassium 3.6, BUN 13, and creatinine 1.58.  DISCHARGE INSTRUCTIONS:  The patient received specific written discharge instructions regarding diet,  activity, and wound care.  He will follow with Dr. Imogene Burn in 2 weeks.  The office will contact the patient with date and time of that appointment.  DISCHARGE INSTRUCTIONS: 1. Levaquin 750 mg 1 p.o. daily x10 days. 2. Aspirin 325 mg day. 3. Colace 0.6 mg b.i.d. 4. Cyclobenzaprine 10 mg t.i.d. p.r.n. 5. Ferrous sulfate 325 mg t.i.d. 6. Hydrocodone/acetaminophen 7.5/750 mg 1 q.6 p.r.n. pain. 7. Nitroglycerin sublingual 0.4 mg q.5 minutes p.r.n. up to the 3     doses. 8. ProAir 2 puffs inhaled q.i.d. p.r.n. 9. Travatan solution 0.004% 1 drop both eyes every night. 10.Tribenzor 20/5/12.5 mg 1 p.o. daily.     Pecola Leisure, PA   ______________________________ Fransisco Hertz, MD    AY/MEDQ  D:  05/21/2010  T:  05/21/2010  Job:  [id number]  Electronically Signed by Pecola Leisure PA on 05/22/2010 09:20:34 AM Electronically Signed by Leonides Sake MD on 05/22/2010 10:50:26 AM

## 2010-05-25 LAB — CULTURE, BLOOD (ROUTINE X 2)
Culture  Setup Time: 201204210440
Culture  Setup Time: 201204210440
Culture: NO GROWTH

## 2010-06-01 ENCOUNTER — Encounter (INDEPENDENT_AMBULATORY_CARE_PROVIDER_SITE_OTHER): Payer: Medicare Other

## 2010-06-01 ENCOUNTER — Ambulatory Visit (INDEPENDENT_AMBULATORY_CARE_PROVIDER_SITE_OTHER): Payer: Medicare Other | Admitting: Vascular Surgery

## 2010-06-01 DIAGNOSIS — I739 Peripheral vascular disease, unspecified: Secondary | ICD-10-CM

## 2010-06-01 DIAGNOSIS — I70219 Atherosclerosis of native arteries of extremities with intermittent claudication, unspecified extremity: Secondary | ICD-10-CM

## 2010-06-01 DIAGNOSIS — Z48812 Encounter for surgical aftercare following surgery on the circulatory system: Secondary | ICD-10-CM

## 2010-06-04 NOTE — Assessment & Plan Note (Signed)
OFFICE VISIT  [name] [name], [name] [name] DOB:  1945-02-23                                       06/01/2010 YNWGN#:[id number]  This is a postoperative followup.  HISTORY OF PRESENT ILLNESS:  This is a 65 year old gentleman with multiple comorbidities, including medical noncompliance, that presents for a follow-up from a right iliofemoral endarterectomy with bovine patch angioplasty, right profundoplasty, right external iliac artery and common iliac artery angioplasty.  He presents now about 2-3 weeks after his procedure.  He notes that he is able to ambulate with greatly decreased pain in the right side; however, his left side, he still has a significant symptomatology on this side.  At his incision he notes no drainage, still has a mild amount of numbness on his inner thigh.  PHYSICAL EXAMINATION:  He had a blood pressure of 127/83, heart rate of 67, respirations were 12, temperature was 98.0.  On targeted exam on his right groin, the incision is healing well, appropriately firm with no drainage.  Right foot is room temperature and not warm.  Both feet do not have palpable pulses.  On noninvasive vascular imaging, he had bilateral ABIs completed.  He had a 0.53, slightly up from previous 0.51.  On the left side, he is at 0.62, up slightly from 0.62.  All pedals are monophasic.  MEDICAL DECISION MAKING:  This is a 65 year old gentleman who is now status post a right iliofemoral endarterectomy, bovine patch angioplasty, profundoplasty, and an angioplasty of the external iliac artery and common iliac artery.  Based on the patient's previous angiograms, I am not surprised that ABIs are unchanged, as he has significant tibial disease.  I did not feel he had a distal target that was large enough to successfully complete a bypass.  Subsequently, to improve his inflow, I did the angioplasties on his iliac artery and also did the extensive endarterectomy to improve the  blood flow to his profunda, which was heavily calcified.  As I explained to him, there was an extensive amount of calcified disease within his arterial system.  So I once again emphasized to him the importance of medical management to help arrest the progression of atherosclerotic disease.  He is going to continue with his aspirin.  We are going to let him heal up for the next 3 months.  He has obtained some improved symptomatology just with increased inflow, and then in 3 months will repeat the study.  From that point, he is going to make a decision in regards whether or not he wants to make attempt at working on the left side.  I have emphasized to him that fundamentally he does not have in-line flow, and he needs to take care of his feet, as any injuries or infections to feet likely will result in amputation.  He is aware of this and will follow up with [place] in 3 months.    Fransisco Hertz, MD Electronically Signed  BLC/MEDQ  D:  06/01/2010  T:  06/04/2010  Job:  [id number]

## 2010-06-06 ENCOUNTER — Ambulatory Visit
Admission: RE | Admit: 2010-06-06 | Discharge: 2010-06-06 | Disposition: A | Discharge status: Home / Self Care | Payer: Medicare Other | Source: Ambulatory Visit | Attending: Thoracic Surgery | Admitting: Thoracic Surgery

## 2010-06-06 ENCOUNTER — Ambulatory Visit (INDEPENDENT_AMBULATORY_CARE_PROVIDER_SITE_OTHER): Payer: Medicare Other | Admitting: Thoracic Surgery

## 2010-06-06 DIAGNOSIS — C3490 Malignant neoplasm of unspecified part of unspecified bronchus or lung: Secondary | ICD-10-CM

## 2010-06-07 NOTE — Assessment & Plan Note (Signed)
OFFICE VISIT  [name] [name], [name] [name] DOB:  [birth year]                                        Jun 06, 2010 CHART #:  [id number]  The patient presents today.  He is now over 3-1/2 years since his surgery and his CT scan showed no evidence of recurrence.  His legs were stable.  He has been treated by Dr. Edilia Bo for that.  His blood pressure is 141/90, pulse 70, respirations 16.  I will see him back in 6 months with a CT scan and will probably refer him back to his medical doctor at that time.  Ines Bloomer, M.D. Electronically Signed  DPB/MEDQ  D:  06/06/2010  T:  06/07/2010  Job:  [id number]
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
## 2010-06-12 NOTE — Assessment & Plan Note (Signed)
OFFICE VISIT   Marianna, DUCRE  DOB:  [birth year]                                       01/12/2010  WUJWJ#:[id number]   This is an established patient.   HISTORY OF PRESENT ILLNESS:  This is a 65 year old gentleman who  recently underwent aortogram of his right leg.  Initially I planned to  image both legs but he had an increase of his creatinine to abnormal  level 1.8.  He was not aware of any chronic renal insufficiency prior to  this iSTAT lab.  I used CO2 and a limited amount of dye to image his  right leg as this was the greater complaint in this leg than his left  leg.  Based on the findings of that angiogram he has severe tibial  disease.  He also has an occluded superficial femoral artery with  significant common femoral artery disease.  Also he has narrowing in his  iliac system.  He has one dominant runoff vessel in his right calf,  however, this was measured on digital calipers to be less than 2 mm and  therefore not suitable for a bypass operation.  This gentleman is  continuing to smoke though he reduced his amount of cigarettes per day.  At this point he has lifestyle limiting claudication but no frank rest  pain to date and does not have any wounds on his feet.   Past medical history, past surgical history, social history, family  history, medications, allergies and review of systems are completely  unchanged from his previous visit.   PHYSICAL EXAMINATION:  He had a temperature 98.2 today, blood pressure  177/96, heart rate of 65, respirations 12.  On focused exam bilateral  lower extremities demonstrate no gangrene or ulceration.  There are no  palpable pulses bilaterally.  There are palpable femoral pulses  bilaterally without any hematoma at cannulation site.   MEDICAL DECISION MAKING:  This is a 65 year old gentleman with bilateral  lower extremity peripheral arterial disease.  Looking at his right leg  there is no distal  revascularization option.  I explained to him that I  could possibly do a right iliofemoral endarterectomy to clean out the  distal external iliac artery and common femoral artery and then likely a  profundoplasty to improve the blood flow to his profunda artery but the  superficial femoral artery is occluded and there is no chance of  reestablishing flow in this artery.  Additionally, I have explained to  him that he has no revascularization option below the knee so he needs  to take extra care of both feet to avoid any injury.  At this point he  has no evidence of any gangrene.  I have concerns whether or not he  would be able to heal any injuries or gangrene to either feet.  My  suspicion though I have not imaged the left leg due to his elevated  creatinine is that his left leg has similar disease.  At this point I  recommend that he maximize his medical management including smoking  sensation.  I asked him to follow up with his primary doctor, Dr.  Shana Chute, for consideration of pharmacologic and counseling to completely  stop smoking.  He at this point still does not want the Pletal that I  offered him.  I would try to see how well  he responds to medical  management before proceeding with any type of surgical intervention  because as I explained to him while I could improve his inflow to his  right groin with the endarterectomy and possible iliac stenting,  ultimately the blood flow from the level of the knee down will not be  significantly improved by the endarterectomy.  So I am not certain that  any surgery will ultimately be able to relieve his symptoms completely  though the surgery may improve the severity of his claudication.  So at  this point he is going to try to maximize his medical management and  then he is going to consider possibly the surgical option that I offered  him.  He is going to follow up with his primary doctor, Dr. Shana Chute, at  the beginning of next month and  then he will follow up with me in 1  month to further discuss what he has decided to do.   Once again, thank you for giving [place] the opportunity to participate in  this gentleman's care.     Fransisco Hertz, MD  Electronically Signed   BLC/MEDQ  D:  01/12/2010  T:  01/15/2010  Job:  [id number]   cc:   Osvaldo Shipper. Spruill, M.D.

## 2010-06-12 NOTE — Discharge Summary (Signed)
NAME:  [name] [name], [name] [name] NO.:  [id number]   MEDICAL RECORD NO.:  [id number]          PATIENT TYPE:  INP   LOCATION:  2024                         FACILITY:  [facility]   PHYSICIAN:  Ines Bloomer, M.D. DATE OF BIRTH:  [birth year]   DATE OF ADMISSION:  10/24/2006  DATE OF DISCHARGE:  10/29/2006                               DISCHARGE SUMMARY   FINAL DIAGNOSES:  1. Chronic obstructive pulmonary disease.  2. Bullous emphysema.  3. Right upper lobe non-small-cell lung cancer, adenocarcinoma      moderately differentiated, T1 N0 MX.   SECONDARY DIAGNOSES:  1. Hypertension.  2. Peripheral vascular disease.  3. History of chronic pancreatitis.  4. History of back problems.  5. Status post right ankle fracture.   HOSPITAL OPERATIONS/PROCEDURES:  Right video-assisted thoracoscopic  surgery with thoracotomy, right upper lobectomy with lymph node  dissection.   HISTORY AND PHYSICAL/HOSPITAL COURSE:  The patient is a 65 year old  African American male with a long history of tobacco abuse as well as  alcohol abuse.  He quit smoking when he was found to have leukopenia and  a low neutrophil count.  There may be a question of right upper lobe  lesions.  He underwent PET scan which showed positive SUV and multiple  bullae in the right upper lobe, few bullae in the left upper lobe.  His  pulmonary function tests showed an FVC of 4.32 and FEV 1.36.  He was  seen by Dr. Shana Chute from a cardiac standpoint and found to be stable.  The patient is being evaluated by Dr. Edwyna Shell.  Dr. Edwyna Shell discussed this  patient undergoing right upper lobectomy.  He discussed risks and  benefits.  The patient acknowledged understanding.  The surgery was  scheduled for October 24, 2006.  For details of patient's past medical  history and physical examination, please see the history and physical.   The patient was taken to the operating room on October 24, 2006, where  he underwent right  video-assisted thoracoscopic surgery with  thoracotomy, right upper lobectomy and lymph node dissection.  Pathology  reports from the surgery came back positive, showing adenocarcinoma  moderately differentiated.  The patient tolerated this procedure well  and was transferred to the intensive care unit in stable condition.  Postoperatively the patient extubated.  He was found to be alert and  oriented x4 on examination.  The patient was also noted to be  hemodynamically stable postoperatively.  From a pulmonary standpoint,  postoperatively chest x-ray was obtained daily.  Patient had no air  leaks day 1 and no pneumothorax on x-ray.  Suction was decreased.  By  postoperative day 2 posterior chest tubes with feed.  By postoperative  day 4 chest x-ray remained stable with no pneumothorax.  No air leaks  noted.  No drainage from chest tubes.  The remaining chest tube was  discontinued.  The following chest x-ray showed to be stable with no  pneumothorax.  The patient continued to improve during his postoperative  course.  He was able to be weaned from oxygen, saturating greater than  90% on  room air.  The patient remained in normal sinus rhythm from a  cardiac standpoint.  His incisions were __________.  The patient did  develop a low grade temperature of 100.2 postoperative day 2.  This  persisted and he was started on IV Fortaz prophylactically.  The patient  had no signs of any infection.  The patient remained afebrile during the  next postoperative period.  IV Elita Quick was discontinued prior to  discharge.  The patient did have slight acute blood loss anemia  postoperatively, hemoglobin dropping to 8.3 and hematocrit 24.3  postoperative day 1.  He was given 1 unit of packed red blood cells.  H&H were continued to be monitored and remained stable during the  remainder of his postoperative course.  Prior to discharge they were 8.5  and 25.4.  The patient was asymptomatic.  Postoperatively the  patient  was out of bed, ambulating well without difficulty.  He was tolerating  diet well and no nausea and vomiting noted.   The patient was felt to be stable for discharge home on October 29, 2006.  Laboratory on October 29, 2006, showed a white count of 7.1, hemoglobin  of 8.5, hematocrit 25.4 with a count of 87.  Sodium of 138, potassium of  4.3, chloride of 103, bicarbonate of 29, BUN of 10, creatinine was 1.24,  glucose 94.   FOLLOWUP:  Followup appointment will be arranged with Dr. Edwyna Shell within  3 weeks.  The office will contact the patient with this information.  The patient will need to obtain PA and lateral chest x-ray 30 minutes  prior to this appointment.   ACTIVITY:  Patient instructed no driving until released to do so and no  lifting over 10 pounds.  He is to ambulate 3-4 times per day as  tolerated and to continue his breathing exercises.   DIET:  Patient's diet to be low-fat, low-salt.   DISCHARGE MEDICATIONS:  1. Prevacid 30 mg daily.  2. Lyrica 50 mg b.i.d.  3. Pletal 100 mg b.i.d.  4. Cyclobenzaprine 10 mg t.i.d. p.r.n.  5. Caduet 10/10 mg daily.  6. Tekturna HCT 300/12.5 mg daily.  7. Travatan 0.004% one drop each eye at bedtime.  8. Albuterol 90 mcg MDI 2 puffs q.i.d.  9. Tylox 1 to 2 tabs q.4-6 hours p.r.n.      Theda Belfast, [place]      Ines Bloomer, M.D.  Electronically Signed    KMD/MEDQ  D:  11/18/2006  T:  11/19/2006  Job:  [id number]   cc:   Ines Bloomer, M.D.

## 2010-06-12 NOTE — Assessment & Plan Note (Signed)
OFFICE VISIT   [name] [name], [name] [name]  DOB:  Apr 10, 1945                                        April 21, 2007  CHART #:  [id number]   HISTORY OF PRESENT ILLNESS:  [name] [name] is a 65 year old black male who  is now approximately 6 months status post right upper lobectomy for non-  small carcinoma.  He returns to the office on today's date following CT  scan of the chest.  Overall, he feels as though he is continuing to make  progress although does have pain frequently in his incision region.  Additionally, he reports approximately that 3 weeks ago he had an  episode of significant cough and sputum production with shortness of  breath .  He did not seek medical attention at that time and has made  some improvement.  Of note, he does continue to smoke cigarettes  although he does report that he is smoking much less than in the past.   CT SCAN:  CT scan was reviewed with Dr. Edwyna Shell.  It shows no evidence of  recurrent disease.   PHYSICAL EXAM:  VITAL SIGNS:  Blood pressure is 168/99, pulse 80,  respirations 18, oxygen saturation is 99%.  GENERAL:  This is a well-developed black male in no acute distress.  PULMONARY:  Clear breath sounds without rhonchi, crackles, or wheeze.  CARDIAC:  Regular, rate, and rhythm.  Normal S1, S2 without murmurs,  gallops, or rubs.  Incision is well healed without evidence of  infection.  EXTREMITIES:  No edema.   ASSESSMENT:  [name] [name] continues to show improvement following his  resection for right upper lobe adenocarcinoma, moderately differentiated  with bronchoalveolar features.  I reinforced the need for him to  discontinue smoking.  He understands the need to do this and is  attempting to do so.  He also understands he needs close followup with  his primary care physician for his other  risk factors including hypertension.  From our regard, we will see him  in 3 months with a chest x-ray at that time for further surgical  followup.   [name] [name], P.A.-C.   [name] [name]  D:  04/21/2007  T:  04/21/2007  Job:  [id number]   cc:   Ines Bloomer, M.D.  Charlaine Dalton. Sherene Sires, MD, FCCP  Osvaldo Shipper. Spruill, M.D.

## 2010-06-12 NOTE — Letter (Signed)
November 05, 2006   Lajuana Matte, MD  [phone] [address]  [place], [place] [address]   Re:  RENNER, SEBALD                 DOB:  [birth year]   Dear Arbutus Ped:   I saw Mr. Vowell back for follow up today and removed his chest tube  sutures. His chest x-ray looks good. He is doing remarkably well  overall. He is still having some mild to moderate pain. I gave him a  refill for Tylox, number of 60. I also arranged for him to get an  appointment to see you. His blood pressure was 143/81, pulse 61,  respirations 18, saturations were 100. I will see him back again in  three weeks with a chest x-ray. His pathology reports stage 1A lung  cancer.   Ines Bloomer, M.D.  Electronically Signed   DPB/MEDQ  D:  11/05/2006  T:  11/06/2006  Job:  [id number]
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
## 2010-06-12 NOTE — Assessment & Plan Note (Signed)
OFFICE VISIT   Scala, Beldon  DOB:  March 06, 1945                                        November 26, 2006  CHART #:  [id number]   EXAMINATION:  His blood pressure was 142/83, pulse rate 60, respirations  18, saturations were 99%.  Lungs were clear to auscultation and  percussion.   Chest x-ray showed normal postoperative changes.   We gave him a refill for Lorcet 7.5/500.  I told him to gradually  increase his activities.  He saw Dr. Arbutus Ped and is just recommended for  long term followup.  I will plan to see him back again in 2 months with  a chest x-ray.   Ines Bloomer, M.D.  Electronically Signed   DPB/MEDQ  D:  11/26/2006  T:  11/26/2006  Job:  [id number]

## 2010-06-12 NOTE — Assessment & Plan Note (Signed)
OFFICE VISIT   [name], [name]  DOB:  11-05-1945                                        October 07, 2007  CHART #:  [id number]   The patient came today.  His blood pressure was 200/100, pulse 67,  respirations 18, and sats were 97%.  We told him to see Dr. Shana Chute  regarding his elevated blood pressure.  Chest x-ray showed no evidence  of recurrence.  We will plan to see him again in 6 months with another  CT scan.  We will follow up his stage IA non-small-cell lung cancer.  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   Ines Bloomer, M.D.  Electronically Signed   DPB/MEDQ  D:  10/07/2007  T:  10/07/2007  Job:  [id number]

## 2010-06-12 NOTE — Letter (Signed)
October 07, 2007   [address]  [place]  [address]   Re:  [name] [name], [name] [name]                 DOB:  1945/07/22   To Whom It May Concern:   The patient underwent a resection of his right upper lobe for lung  cancer .  Since then, he has been followed by [place] with no evidence of  recurrence of his cancer, and we will continue to follow him for the  next 4 years.  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  If he will sign  any release, we will be happy to give you any further information  regarding his condition.   Ines Bloomer, M.D.  Electronically Signed   DPB/MEDQ  D:  10/07/2007  T:  10/07/2007  Job:  [id number]

## 2010-06-12 NOTE — Assessment & Plan Note (Signed)
OFFICE VISIT   [name] [name], [name]  DOB:  December 24, 1945                                        November 16, 2008  CHART #:  [id number]   The patient returns for his 2-year followup after having a right upper  lobectomy for bronchoalveolar cancer.  His chest x-ray shows no evidence  of recurrence.  His lungs are clear to auscultation and percussion.  Heart regular sinus rhythm.  His blood pressure is 167/85, pulse 63,  respirations 18, sats were 98%.  Plan to see him back again in 6 months  with a CT scan.   Ines Bloomer, M.D.  Electronically Signed   DPB/MEDQ  D:  11/16/2008  T:  11/16/2008  Job:  [id number]

## 2010-06-12 NOTE — Assessment & Plan Note (Signed)
OFFICE VISIT   [name] [name], [name] [name]  DOB:  [birth year]                                        May 16, 2009  CHART #:  [id number]   HISTORY:  The patient is a 65 year old black male who is status post  right upper lobectomy done on October 24, 2006, for a stage IA non-  small cell carcinoma.  He is seen on today's date in routine followup  with a CT scan of the chest.  Currently, he reports some chronic  shortness of breath which is mild.  He does have some occasional chest  discomfort which also again is mild but overall, he feels as though he  is very stable.   DIAGNOSTIC TESTS:  CT scan of the chest was obtained on today's date.  It reveals no new findings of metastatic disease.  There are no new  significant findings.   PHYSICAL EXAMINATION:  Vital Signs:  Blood pressure is 144/91, pulse 73,  respirations 18, oxygen saturation is 100%.  General Appearance:  A well-  developed black male in no acute distress.  Pulmonary:  Clear lungs  bilaterally.  Cardiac:  Regular rate and rhythm.  Normal S1 and S2.   ASSESSMENT:  The patient continues to do well.  His CT scan reveals no  new findings.  We will see him again in 6 months for a continued  surveillance at that time with a PA and lateral chest x-ray.   Rowe Clack, P.A.-C.   Sherryll Burger  D:  05/16/2009  T:  05/17/2009  Job:  [id number]

## 2010-06-12 NOTE — Assessment & Plan Note (Signed)
OFFICE VISIT   ESPIRIDION, SUPINSKI  DOB:  Mar 27, 1945                                        January 20, 2007  CHART #:  [id number]   Mr [name] [name] came back.  His blood pressure is 150/88, pulse 59,  respiration 19, saturations 98%.  Chest x -ray shows normal  postoperative changes.  Incisions well healed.  Complains of some  numbness around his incision.  Overall, he is doing well.  We plan to  see him back again in 3 months with a CT scan.   Ines Bloomer, M.D.  Electronically Signed   DPB/MEDQ  D:  01/20/2007  T:  01/20/2007  Job:  [id number]

## 2010-06-12 NOTE — Assessment & Plan Note (Signed)
OFFICE VISIT   [name], [name]  DOB:  1945-04-03                                        April 20, 2008  CHART #:  [id number]   The patient came for followup today.  He is doing well.  His CT scan  showed no evidence of recurrence, now over 18 months since the surgery.  His blood pressure was 142/86, pulse 67, respirations 18, and sats were  99%.  I will see him back again in 6 months with a chest x-ray.   Ines Bloomer, M.D.  Electronically Signed   DPB/MEDQ  D:  04/20/2008  T:  04/21/2008  Job:  [id number]

## 2010-06-12 NOTE — Op Note (Signed)
NAME:  [name] [name], [name] [name] NO.:  [id number]   MEDICAL RECORD NO.:  [id number]          PATIENT TYPE:  INP   LOCATION:  2307                         FACILITY:  [facility]   PHYSICIAN:  Ines Bloomer, M.D. DATE OF BIRTH:  1945-04-07   DATE OF PROCEDURE:  10/24/2006  DATE OF DISCHARGE:                               OPERATIVE REPORT   PREOPERATIVE DIAGNOSIS:  1. Chronic obstructive pulmonary disease.  2. Bullous emphysema.  3. Right upper lobe non-small cell lung cancer.   POSTOPERATIVE DIAGNOSIS:  1. Chronic obstructive pulmonary disease.  2. Bullous emphysema.  3. Right upper lobe non-small cell lung cancer.   OPERATION:  1. Right video-assisted thoracic surgery.  2. Right thoracotomy.  3. Right upper lobectomy.   SURGEON:  Ines Bloomer, M.D.   ANESTHESIA:  General anesthesia   DESCRIPTION OF PROCEDURE:  After percutaneous insertion of all  monitoring lines, the patient underwent general anesthesia, was prepped  and draped in the usual sterile manner.  He was turned to the right  lateral thoracotomy position, and a dual-lumen tube was inserted, two  trocar sites were inserted, and a 30-degree scope was inserted. There  was a lot of bullous emphysema in the superior portion of the lung and  this was associated with severe emphysema.   A posterior lateral thoracotomy incision was made over the fifth  intercostal space.  The latissimus was divided, the trachea was  reflected anteriorly, and serratus anterior was reflected anteriorly.  The fifth intercostal space was entered, and a portion of the sixth rib  was taken subperiosteally at the angle.  Two __________  were placed at  right angles.  The adhesions of the upper lobe were taken down with  electrocautery, there were a marked amount of adhesions, and then  dissection was started anteriorly dissecting out the apical posterior  branch to the upper lobe, stable it, and divided with the auto suture 30  gray  Roticulator.  Then the two branches of the superior pulmonary vein  were dissected out, stapled.  The large one was stapled and divided with  the auto suture 30 Roticulator and the other one doubly ligated with  silk, clipped, and divided.  An anterior branch to the right upper lobe  was then, also, was stapled divided with the auto suture 30 gray  Roticulator.   Attention was then turned to the fissure, and the pulmonary artery was  identified in the fissure, and superior portion of the major fissure was  divided with the auto suture green stapler.  This exposed the posterior  branch  that was coming off the superior segmental artery.  This was  divided with 2-0 silk, clipped, and divided.  Then another small branch  was doubly clipped and divided.  The bronchus was then stapled with a TA-  30 and divided distally.  Several 10-R and 11-R nodes were dissected  free, and then the minor fissure was divided with the auto sutures 30,  and 60-stapler.  The right upper lobe was removed.  The inferior  pulmonary ligament was taken down.  All bleeders  were electrocoagulated.  CoSeal was applied to the staple line.  Two 28-chest tubes were brought  into the trocar sites and tied in place with #0 silk.  The chest was  closed with 5 pericostal drilling through the sixth rib and passing  around the superior portion of the fifth rib, #1 Vicryl in the muscle  layer, 2-0 Vicryl in the subcutaneous tissue, and Ethicon skin clips.  The patient was returned to the recovery room in stable condition.      Ines Bloomer, M.D.  Electronically Signed     DPB/MEDQ  D:  10/24/2006  T:  10/24/2006  Job:  [id number]   cc:   Charlaine Dalton. Sherene Sires, MD, FCCP

## 2010-06-12 NOTE — Assessment & Plan Note (Signed)
OFFICE VISIT   CORNELL, BOURBON  DOB:  06-18-1945                                       12/15/2009  [id number]#:[id number]   This is a new patient consultation requested by Dr. Donia Guiles.   HISTORY OF PRESENT ILLNESS:  This is a 65 year old male that previously  has had bilateral common iliac and external iliac angioplasty and  stenting.  The last time he was seen in this office appears to be in  October of 2006 for which he had bilateral ABIs completed.  Right side  was 0.58 and left side 0.60.  Since then the patient notes he continues  to have bilateral lower extremity claudication but now even less than 1  block of walking he gets claudication.  He denies any frank rest pain,  denies any ulcers or gangrenous changes.  He does state that this  influences his lifestyle as he has to walk greater than 3 blocks to get  to the bus station and is limited in his transportation otherwise.  He  does note that he continues to be a heavy smoker and despite multiple  attempts to stop smoking he has been successful.  When we attempted to  discuss his efforts with smoking cessation he became hostile so I  stopped the conversation at that point.   He did not remember the medications he was currently on.  He thinks he  is on possibly Crestor and aspirin but he does not remember exactly.  His allergies included ibuprofen and shellfish.   PAST MEDICAL HISTORY:  Included glaucoma, peripheral arterial disease,  history of recurrent pancreatitis.   PAST SURGICAL HISTORY:  Included the previously noted bilateral common  iliac and external iliac angioplasty and stenting.  He had some type of  anterior cervical fusion and epigastric hernia repair and eye surgery  that he is not exactly certain what was done.   FAMILY HISTORY:  Significant for diabetes and hypertension in multiple  family members.   SOCIAL HISTORY:  He smokes greater than 1 pack a day and smoked with a  greater than 60 pack year history.  He also has a previous history of  heavy alcohol use but he claims he at this point has stopped drinking.  Denies any illicit drug use.   REVIEW OF SYSTEMS:  He noted headaches, change in his eyesight due to  glaucoma, pain in his legs with walking, pain in his legs with lying  flat, home oxygen, productive cough, bronchitis, asthma, wheezing,  arthritis, joint pain, muscle pain, chest pain, chest tightness,  shortness of breath lying flat and shortness of breath with exertion,  reflux, diarrhea and frequent urination and depression.  Otherwise the  rest of his 12 point review of systems was as noted.   PHYSICAL EXAMINATION:  Vital signs:  Blood pressure 164/62, heart rate  56, respirations were 12.  General:  A well-developed, somewhat cachectic, no apparent distress.  Head:  Normocephalic, atraumatic.  Some temporalis wasting.  ENT:  The nares had no erythema.  There was no drainage.  The hearing  was grossly intact.  His oropharynx was without any erythema or exudate.  Neck:  Supple with no nuchal rigidity, no JVD or any type of cervical  lymphadenopathy.  Eyes:  He had extraocular movements intact.  Pupils were equal, round  and reactive to light.  Pulmonary:  Symmetric expansion, good air movement.  Lungs were clear to  auscultation bilaterally with no rales, rhonchi or wheezing.  Cardiac:  Regular rate and rhythm.  Normal S1-S2.  No murmurs, rubs,  thrills or gallops.  Vascular:  He had palpable radial and brachial arteries, palpable  carotids with no bruits.  I was able to palpate his aorta which was a  normal size.  His femorals were palpable.  I did not appreciate any  popliteal or pedal pulses.  GI:  Soft abdomen, nontender, nondistended.  No guarding, no rebound, no  hepatosplenomegaly, no obvious masses.  Musculoskeletal:  He had 5/5 strength in all extremities.  His lower  extremities demonstrate no ischemic changes.  There are no  ulcerations  or gangrene and there is no hair on bilateral lower extremities,  findings that are consistent with chronic peripheral arterial disease.  Upper extremities were completely within normal.  Neurological:  Cranial nerves II-XII were intact.  His sensation was  intact in all extremities.  Motor exam was as noted above.  Psychiatric:  His judgment appeared to be intact.  His mood and affect  is somewhat hostile with depressed affect and mood.  Skin:  There were no obvious rashes on the body otherwise the  extremities were as listed above.  Lymphatic:  He had no cervical, axillary or inguinal lymphadenopathy.   He had bilateral lower extremity ABIs which I reviewed.  On the right  side there was an ABI of 0.51, on the left side 0.61.  There were  monophasic waveforms in bilateral lower extremities.   MEDICAL DECISION MAKING:  This is a 65 year old gentleman with known  peripheral arterial disease.  In comparing the ABIs looking back the  last measurement we had in clinic 0.58 on the right side, currently  0.51; and on the left side it was previously 0.60 and currently 0.61.  So in terms of objective evidence he has no significant change in terms  of his peripheral arterial disease.  However, from a symptomatic  viewpoint he has gone from previously nonlifestyle limiting claudication  to now lifestyle limiting claudication.  We discussed the non-  interventional management of this including maximal medical management  including use of aspirin, possible use of a walking plan, and Pletal.  He did not feel at this point that any of these interventions would be  adequate.  I spent almost 10 minutes trying to counsel him in terms of  smoking sensation and he became quite hostile during this process of  having this discussion but of all these interventions I feel that  smoking cessation probably would be the most beneficial in this  gentleman.  At this point he is resistant to possible  counseling or any  pharmacologic intervention.  Given the lifestyle altering nature of his  complaints I think it is not unreasonable to proceed forward with an  aortogram, bilateral leg runoff and possible intervention.  It is not  clear to me if he has some degree of restenosis of the iliacs which is  quite common or he has progression of distal disease.  The aortogram  would allow [place] to interrogate this and possibly intervene.  He is aware  of the risks of this including possible embolization, possible access  complications, possible need for surgical intervention post intervention  and the possibility that no intervention is possible.  He is aware of  this and has agreed to proceed forward with such.  He will need to  get  prophylaxis the day of, for his shellfish allergy.   I appreciate being given the opportunity to participate in the care of  this patient.     Leonides Sake, MD  Electronically Signed   BC/MEDQ  D:  12/15/2009  T:  12/18/2009  Job:  [id number]   cc:   Osvaldo Shipper. Spruill, M.D.

## 2010-06-15 NOTE — Assessment & Plan Note (Signed)
OFFICE VISIT   [name] [name], [name] [name]  DOB:  1945-12-05                                        November 27, 2009  CHART #:  [id number]   The patient came today for followup.  His chest x-ray is stable.  No  evidence of recurrence of his cancer, now over 3 years since his  surgery.  We will see him again in 6 months with a CT scan.  His blood  pressure was 170/90, pulse 61, respirations 18, and sats were 93%.  His  lungs are clear to auscultation and percussion.   Ines Bloomer, M.D.  Electronically Signed   DPB/MEDQ  D:  11/27/2009  T:  11/28/2009  Job:  [id number]

## 2010-06-15 NOTE — Op Note (Signed)
   NAME:  [name] [name], [name] [name]                           ACCOUNT NO.:  [id number]   MEDICAL RECORD NO.:  [id number]                   PATIENT TYPE:  OIB   LOCATION:  NA                                   FACILITY:  [facility]   PHYSICIAN:  Leonie Man, M.D.                DATE OF BIRTH:  06/11/1945   DATE OF PROCEDURE:  12/10/2001  DATE OF DISCHARGE:                                 OPERATIVE REPORT   PREOPERATIVE DIAGNOSIS:  Incarcerated epigastric hernia.   POSTOPERATIVE DIAGNOSIS:  Incarcerated epigastric hernia.   OPERATION PERFORMED:  Repair of incarcerated epigastric hernia.   SURGEON:  Leonie Man, M.D.   ASSISTANT:  Nurse.   ANESTHESIA:  General.   INDICATIONS FOR PROCEDURE:  The patient is a 65 year old man with an  enlarging and somewhat painful epigastric hernia, who desires repair.  He  comes to the operating room after the risks and benefits of surgery have  been fully discussed and consent obtained.   DESCRIPTION OF PROCEDURE:  Following the induction of satisfactory general  anesthesia, the patient was positioned supinely.  The abdomen was prepped  and draped to be included in the sterile operative field.  We used Hibiclens  prep as the patient gives a history of allergy to Betadine and to shellfish.  A vertical incision was made over the midline hernia and deepened through  the skin and subcutaneous tissues with dissection carried down to the  capsule of the hernia, the hernia dissected free on all sides and carried  down to the fascia and the incarcerated segment which of made up of  falciform ligament was dissected free, amputated between clamps and secured  with ties of 2-0 silk.  The defect showed no evidence of tension, was closed  with a running suture of 2-0 Novofil.  The subcutaneous tissues were then  closed after sponge, instrument and sharp counts were verified with a  running 3-0 Vicryl and the skin was closed with a running 4-0 Monocryl.  The  wound was  then reinforced with Steri-Strips.  Sterile dressings were  applied.  Anesthetic was reversed and the patient removed from the operating  room to the recovery room in stable condition, having tolerated the  procedure well.                                                 Leonie Man, M.D.    PB/MEDQ  D:  12/10/2001  T:  12/10/2001  Job:  [id number]

## 2010-06-15 NOTE — Procedures (Signed)
NAME:  [name] [name], [name] [name]               ACCOUNT NO.:  [id number]   MEDICAL RECORD NO.:  [id number]          PATIENT TYPE:  OUT   LOCATION:  SLEEP CENTER                 FACILITY:  [facility]   PHYSICIAN:  Clinton D. Maple Hudson, M.D. DATE OF BIRTH:  April 21, 1945   DATE OF STUDY:                              NOCTURNAL POLYSOMNOGRAM   REFERRING PHYSICIAN:  Dr. Donia Guiles.   DATE OF STUDY:  February 21, 2005.   INDICATION FOR STUDY:  Hypersomnia with sleep apnea.   EPWORTH SLEEPINESS SCORE:  2/24.   BMI:  23.   WEIGHT:  135 pounds.   Medication list was reviewed.   SLEEP ARCHITECTURE:  Total sleep time 262 minutes with sleep efficiency 66%.  Stage I was 5%, stage II 79%, stages III and IV were absent, REM 16% of  total sleep time. Sleep latency 54 minutes, REM latency 62 minutes, awake  after sleep onset 80 minutes, arousal index 8. The technician notes that the  patient did not take any of his routine medications on this day he does  that sometimes.  The patient indicated he did not want to be here and felt  all of this was a waste of his time.  He did not bring his glasses to read  his patient information and announced that he was not going to sleep but  would watch TV all night.   RESPIRATORY DATA:  Apnea/hypopnea index (AHI, RDI) 1.8 obstructive events  per hour which is within normal limits (0/5 per hour). This included 1  central apnea, 3 obstructive apneas and 4 hypopneas. All events were  reported while supine. REM AHI 4.3 per hour. He did not qualify for C-PAP  titration because of insufficient events.   OXYGEN DATA:  Mild to moderate snoring with oxygen desaturation to a nadir  of 92%. Mean oxygen saturation through the study was 95% on room air.   CARDIAC DATA:  Normal sinus rhythm.   MOVEMENT/PARASOMNIA:  Occasional leg jerk with little effect on sleep.   IMPRESSION/RECOMMENDATIONS:  1.  Note comments above. The patient did not come feeling that the study      would be  useful for him.  2.  Occasional sleep disordered breathing events, AHI 1.8 per hour with      moderate snoring and oxygen      desaturation to a nadir of 92%. Events were primarily associated with      sleep on his back and might be prevented by encouraging sleep on sides.      Clinton D. Maple Hudson, M.D.  Diplomate, Biomedical engineer of Sleep Medicine  Electronically Signed     CDY/MEDQ  D:  02/24/2005 13:10:19  T:  02/25/2005 02:53:24  Job:  [id number]
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
## 2010-06-15 NOTE — Op Note (Signed)
NAME:  [name] [name], [name] [name]               ACCOUNT NO.:  [id number]   MEDICAL RECORD NO.:  [id number]          PATIENT TYPE:  AMB   LOCATION:  DSC                          FACILITY:  [facility]   PHYSICIAN:  Monterrio A. Thurston Hole, M.D. DATE OF BIRTH:  10/06/1945   DATE OF PROCEDURE:  05/06/2006  DATE OF DISCHARGE:                               OPERATIVE REPORT   PREOPERATIVE DIAGNOSIS:  Right ankle wound dehiscence status post open  reduction and internal fixation ankle fracture with exposure of retained  hardware.   POSTOPERATIVE DIAGNOSIS:  Right ankle wound dehiscence status post open  reduction and internal fixation ankle fracture with exposure of retained  hardware.   PROCEDURE:  1. Right ankle wound irrigation and debridement.  2. Removal of retained hardware, right medial malleolus.   SURGEON:  Salvatore Marvel, M.D.   ASSISTANT:  Gifford Shave, P.A.   ANESTHESIA:  General.   OPERATIVE TIME:  30 minutes.   COMPLICATIONS:  None.   INDICATIONS FOR PROCEDURE:  [name] is 65 year old gentleman who had  undergone ORIF of his right ankle fracture 2 months ago.  He has had  skin breakdown over the medial malleolus and has skin breakdown over the  medial malleolus and now has an exposed screw from the medial malleolus  fracture.  The fracture has healed satisfactorily and he is now to  undergo wound debridement, primary closure after hardware removal.   DESCRIPTION:  [name] is brought in the operating room on May 06, 2006, placed on the operating room in the supine position.  After  adequate level of general anesthesia was obtained, his right ankle was  prepped with Hibiclens and draped using sterile technique.  He received  vancomycin 1 gram IV preoperatively for prophylaxis.  The wound  dehiscent area was then incised longitudinally and the wound edges were  excised approximately 1 cm proximal and distal to the wound dehiscence.  The two medial malleolar screws were removed  with a screwdriver without  complications.  The surrounding granulation tissue was thoroughly  debrided and then thoroughly irrigated.  After this was done, there was  found to be healthy bleeding tissues around the skin edges and, thus,  after the skin edges were resected back to healthy appearing areas, and  the hardware was removed and the granulation tissue was debrided and  removed and the wound was closed with multiple 3-0 mattress retention  sutures.  After this was done, the wound was injected with 0.25%  Marcaine.  Sterile dressings were applied.  The patient was awakened and  taken to the recovery room in stable condition.   FOLLOWUP CARE:  Mr. Downs will be followed as an outpatient on Vicodin  and Keflex.  He is to come back in the office in a week for a wound  check and followup.      Roshad A. Thurston Hole, M.D.  Electronically Signed     RAW/MEDQ  D:  05/06/2006  T:  05/06/2006  Job:  [id number]
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
## 2010-09-05 NOTE — Progress Notes (Signed)
[facility]  Established Intermittent Claudication  History of Present Illness  [name] [name] is a 65 y.o. male who presents with chief complaint: B leg intermittent claudication.  The patient previously underwent:  PROCEDURE (05/16/10):   1. Right iliofemoral endarterectomy with bovine patch angioplasty.   2. Right profundoplasty.   3. Right external iliac artery and common iliac artery angioplasty.   4. Right leg angiogram.   The patient's symptoms have not progressed.  Initially the patient's symptoms improved post-operatively.  The patient's symptoms are: intermittent claudication The patient's treatment regimen currently included: maximal medical management.  The patient continues to smoke.  Past Medical History, Past Surgical History, Social History, Family History, Medications, Allergies, and Review of Systems are unchanged from previous evaluation on 06/01/10.  Physical Examination  Filed Vitals:   09/07/10 1032  BP: 122/82  Pulse: 64    General: A&O x 3, WDWN, cachectic  Pulmonary: Sym exp, good air movt, CTAB, no rales, rhonchi, & wheezing  Cardiac: RRR, Nl S1, S2, no Murmurs, rubs or gallops  Vascular: Vessel Right Left  Radial Palpable Palpable  Ulnary Palpable Palpable  Brachial Palpable Palpable  Carotid Palpable, without bruit Palpable, without bruit  Aorta Non-palpable N/A  Femoral Palpable Palpable  Popliteal Non-palpable Non-palpable  PT Non-palpable Non-palpable  DP Non-palpable Non-palpable   Musculoskeletal: M/S 5/5 throughout , Extremities without ischemic changes , RLE incision well healed  Neurologic: Pain and light touch intact in extremities , Motor exam as listed above  Non-Invasive Vascular Imaging ABI (Date: 09/07/10)  RLE: 0.59 (0.53)  LLE: 0.70 (0.62)  Aortoiliac Duplex (Date: 09/07/10)  Ao: 45-47 c/s  R iliac: CIA 43-72 c/s, EIA 153-307 c/s  Medical Decision Making  [name] [name] is a 65 y.o. male  who presents with: B  leg intermittent claudication without evidence of critical limb ischemia.  Based on the patient's vascular studies and examination, I have offered the patient: medical management.    He had no sx response to inflow improvement so there is no reason to offer the same on the left side.  Previously, I did not think he had an acceptable distal target for bypass.  As his sx are stable and he continues to smoke, I don't see any advantage to intervening at this time.  If the EIA disease continues to escalate, intervention with a stent maybe necessary.  He will follow up in 3 months with repeat studies.  I discussed in depth with the patient the nature of atherosclerosis, and emphasized the importance of maximal medical management including strict control of blood pressure, blood glucose, and lipid levels, obtaining regular exercise, and cessation of smoking.  The patient is aware that without maximal medical management the underlying atherosclerotic disease process will progress, limiting the benefit of any interventions.  Thank you for allowing [place] to participate in this patient's care.  Leonides Sake, MD [facility] Office: [phone] Pager: [phone]

## 2010-09-06 ENCOUNTER — Encounter: Payer: Self-pay | Admitting: Vascular Surgery

## 2010-09-07 ENCOUNTER — Ambulatory Visit (INDEPENDENT_AMBULATORY_CARE_PROVIDER_SITE_OTHER): Payer: Medicare Other | Admitting: Vascular Surgery

## 2010-09-07 ENCOUNTER — Encounter (INDEPENDENT_AMBULATORY_CARE_PROVIDER_SITE_OTHER): Payer: Medicare Other

## 2010-09-07 ENCOUNTER — Encounter: Payer: Self-pay | Admitting: Vascular Surgery

## 2010-09-07 VITALS — BP 122/82 | HR 64 | Ht 67.0 in | Wt 130.0 lb

## 2010-09-07 DIAGNOSIS — I771 Stricture of artery: Secondary | ICD-10-CM

## 2010-09-07 DIAGNOSIS — Z48812 Encounter for surgical aftercare following surgery on the circulatory system: Secondary | ICD-10-CM

## 2010-09-07 DIAGNOSIS — I70209 Unspecified atherosclerosis of native arteries of extremities, unspecified extremity: Secondary | ICD-10-CM

## 2010-09-07 DIAGNOSIS — I70219 Atherosclerosis of native arteries of extremities with intermittent claudication, unspecified extremity: Secondary | ICD-10-CM

## 2010-09-07 DIAGNOSIS — I739 Peripheral vascular disease, unspecified: Secondary | ICD-10-CM

## 2010-09-21 NOTE — Procedures (Unsigned)
AORTA-ILIAC DUPLEX EVALUATION  INDICATION:  Follow up right ileofemoral angioplasty.  HISTORY: Diabetes:  No Cardiac:  No Hypertension:  Yes Smoking:  Yes Previous Surgery:  Right iliofemoral, common iliac and external iliac artery angioplasties.              SINGLE LEVEL ARTERIAL EXAM                             RIGHT                  LEFT Brachial:                  120                    123 Anterior tibial:           71                     86 Posterior tibial:          Not detected           78 Peroneal:                  73 Ankle/brachial index:      0.59                   0.70 Previous ABI/date: (06/01/2010)                     0.53 0.62  AORTA-ILIAC DUPLEX EXAM Aorta - Proximal     45 Cm/s Aorta - Mid          Not visualized Aorta - Distal       47 cm/s  RIGHT                                   LEFT 43 cm/s           CIA-PROXIMAL 72 cm/s           CIA-DISTAL Not visualized    HYPOGASTRIC 307 cm/s          EIA-PROXIMAL 190 cm/s          EIA-MID 153 cm/s          EIA-DISTAL  IMPRESSION: 1. Patent right common iliac and external iliac artery angioplasty     sites with velocities as described above. 2. The bilateral ankle-brachial indices and Doppler waveforms suggest     moderately decreased perfusion at the bilateral lower extremities.     Stable bilateral ankle-brachial indices noted when compared to the     previous exam.       ___________________________________________ Fransisco Hertz, MD  CH/MEDQ  D:  09/07/2010  T:  09/07/2010  Job:  [id number]

## 2010-11-08 LAB — CBC
HCT: 24 — ABNORMAL LOW
HCT: 27 — ABNORMAL LOW
HCT: 38.4 — ABNORMAL LOW
MCHC: 33.7
MCV: 93.9
MCV: 93.9
MCV: 94.9
MCV: 95.6
Platelets: 124 — ABNORMAL LOW
Platelets: 131 — ABNORMAL LOW
Platelets: 187
Platelets: 202
RBC: 2.55 — ABNORMAL LOW
RDW: 13.4
RDW: 14
WBC: 11.7 — ABNORMAL HIGH
WBC: 4.6
WBC: 7.1
WBC: 8.3

## 2010-11-08 LAB — TYPE AND SCREEN
ABO/RH(D): O POS
Antibody Screen: NEGATIVE

## 2010-11-08 LAB — BASIC METABOLIC PANEL
BUN: 10
BUN: 12
CO2: 29
CO2: 26
Calcium: 8.8
Calcium: 8.8
Chloride: 102
Chloride: 104
Creatinine, Ser: 1.24
Creatinine, Ser: 1.32
GFR calc Af Amer: >60
GFR calc Af Amer: >60
GFR calc non Af Amer: 60 — ABNORMAL LOW
GFR calc non Af Amer: 56 — ABNORMAL LOW
GFR calc non Af Amer: >60
Glucose, Bld: 94
Glucose, Bld: 113 — ABNORMAL HIGH
Potassium: 3.6
Potassium: 3.7
Sodium: 138

## 2010-11-08 LAB — COMPREHENSIVE METABOLIC PANEL
AST: 28
AST: 67 — ABNORMAL HIGH
Albumin: 2.7 — ABNORMAL LOW
Albumin: 3.6
BUN: 14
Calcium: 9.7
Chloride: 104
Chloride: 108
Creatinine, Ser: 1.01
Creatinine, Ser: 1.18
GFR calc Af Amer: >60
GFR calc Af Amer: >60
Total Bilirubin: 0.8
Total Bilirubin: 0.9
Total Protein: 7.7

## 2010-11-08 LAB — BLOOD GAS, ARTERIAL
Acid-Base Excess: 0.1
Drawn by: 274481
Patient temperature: 98.6
TCO2: 24.3
pCO2 arterial: 32 — ABNORMAL LOW
pH, Arterial: 7.475 — ABNORMAL HIGH

## 2010-11-08 LAB — POCT I-STAT 7, (LYTES, BLD GAS, ICA,H+H)
Acid-base deficit: 6 — ABNORMAL HIGH
Bicarbonate: 27.5 — ABNORMAL HIGH
Calcium, Ion: 1.26
O2 Saturation: 99
Sodium: 138
pO2, Arterial: 253 — ABNORMAL HIGH

## 2010-11-08 LAB — PROTIME-INR: INR: 0.9

## 2010-11-08 LAB — CULTURE, BLOOD (ROUTINE X 2): Culture: NO GROWTH

## 2010-11-08 LAB — URINALYSIS, ROUTINE W REFLEX MICROSCOPIC
Glucose, UA: NEGATIVE
Ketones, ur: NEGATIVE
Protein, ur: NEGATIVE
Urobilinogen, UA: 1

## 2010-11-08 LAB — POCT I-STAT 3, ART BLOOD GAS (G3+)
Bicarbonate: 23.5
O2 Saturation: 91
Patient temperature: 100.3
TCO2: 25
pO2, Arterial: 61 — ABNORMAL LOW

## 2010-11-08 LAB — APTT: aPTT: 35

## 2010-11-13 ENCOUNTER — Other Ambulatory Visit: Payer: Self-pay | Admitting: Thoracic Surgery

## 2010-11-13 DIAGNOSIS — C3490 Malignant neoplasm of unspecified part of unspecified bronchus or lung: Secondary | ICD-10-CM

## 2010-12-05 ENCOUNTER — Ambulatory Visit
Admission: RE | Admit: 2010-12-05 | Discharge: 2010-12-05 | Disposition: A | Discharge status: Home / Self Care | Payer: Medicare Other | Source: Ambulatory Visit | Attending: Thoracic Surgery | Admitting: Thoracic Surgery

## 2010-12-05 ENCOUNTER — Encounter: Payer: Self-pay | Admitting: *Deleted

## 2010-12-05 ENCOUNTER — Encounter: Payer: Self-pay | Admitting: Thoracic Surgery

## 2010-12-05 ENCOUNTER — Ambulatory Visit (INDEPENDENT_AMBULATORY_CARE_PROVIDER_SITE_OTHER): Payer: Medicare Other | Admitting: Thoracic Surgery

## 2010-12-05 VITALS — BP 137/85 | HR 82 | Resp 16 | Ht 67.0 in | Wt 127.0 lb

## 2010-12-05 DIAGNOSIS — J45909 Unspecified asthma, uncomplicated: Secondary | ICD-10-CM | POA: Insufficient documentation

## 2010-12-05 DIAGNOSIS — F1011 Alcohol abuse, in remission: Secondary | ICD-10-CM | POA: Insufficient documentation

## 2010-12-05 DIAGNOSIS — I739 Peripheral vascular disease, unspecified: Secondary | ICD-10-CM | POA: Insufficient documentation

## 2010-12-05 DIAGNOSIS — IMO0002 Reserved for concepts with insufficient information to code with codable children: Secondary | ICD-10-CM | POA: Insufficient documentation

## 2010-12-05 DIAGNOSIS — C3490 Malignant neoplasm of unspecified part of unspecified bronchus or lung: Secondary | ICD-10-CM

## 2010-12-05 DIAGNOSIS — H409 Unspecified glaucoma: Secondary | ICD-10-CM | POA: Insufficient documentation

## 2010-12-05 DIAGNOSIS — C3410 Malignant neoplasm of upper lobe, unspecified bronchus or lung: Secondary | ICD-10-CM

## 2010-12-05 DIAGNOSIS — I10 Essential (primary) hypertension: Secondary | ICD-10-CM | POA: Insufficient documentation

## 2010-12-05 NOTE — Progress Notes (Signed)
HPI the patient returns for followup CT scan. He is now 4 years since he had a right upper lobectomy for stage Ib non-small cell lung cancer. CT scan shows no evidence of recurrence. Since his been over 4 years we will refer him back to his medical Dr. for long-term followup.   Current Outpatient Prescriptions  Medication Sig Dispense Refill  . ALBUTEROL IN Inhale into the lungs. 2 puffs inhaled every 4 hrs as needed       . aspirin 325 MG EC tablet Take 325 mg by mouth daily.        Marland Kitchen HYDROcodone-acetaminophen (VICODIN ES) 7.5-750 MG per tablet       . HYDROcodone-ibuprofen (VICOPROFEN) 7.5-200 MG per tablet Take 1 tablet by mouth every 6 (six) hours.        . nitroGLYCERIN (NITROSTAT) 0.4 MG SL tablet Place 0.4 mg under the tongue every 5 (five) minutes as needed.        Marland Kitchen PROAIR HFA 108 (90 BASE) MCG/ACT inhaler       . TRAVATAN Z 0.004 % SOLN ophthalmic solution       . travoprost, benzalkonium, (TRAVATAN) 0.004 % ophthalmic solution 1 drop at bedtime.        Marland Kitchen aliskiren (TEKTURNA) 300 MG tablet Take 300 mg by mouth daily.        Marland Kitchen amLODipine (NORVASC) 10 MG tablet       . amLODipine-olmesartan (AZOR) 10-40 MG per tablet Take 1 tablet by mouth daily.        Marland Kitchen COLCRYS 0.6 MG tablet       . cyclobenzaprine (FLEXERIL) 10 MG tablet Take 10 mg by mouth 3 (three) times daily as needed.        . ferrous sulfate 325 (65 FE) MG tablet Take 325 mg by mouth 3 (three) times daily after meals.        . hydrochlorothiazide (,MICROZIDE/HYDRODIURIL,) 12.5 MG capsule       . lisinopril (PRINIVIL,ZESTRIL) 40 MG tablet          Review of Systems: Unchanged   Physical Exam  Cardiovascular: Normal rate, regular rhythm and normal heart sounds.   Pulmonary/Chest: Effort normal and breath sounds normal. No respiratory distress.     Diagnostic Tests: CT scan shows no evidence of recurrence   Impression: Status post right upper lobectomy for stage Ibnon-small cell lung cancer   Plan: Followup by  medical Dr.

## 2011-01-29 HISTORY — PX: EYE SURGERY: SHX253

## 2011-03-11 ENCOUNTER — Other Ambulatory Visit (INDEPENDENT_AMBULATORY_CARE_PROVIDER_SITE_OTHER): Payer: 59 | Admitting: *Deleted

## 2011-03-11 ENCOUNTER — Encounter (INDEPENDENT_AMBULATORY_CARE_PROVIDER_SITE_OTHER): Payer: 59 | Admitting: *Deleted

## 2011-03-11 DIAGNOSIS — Z48812 Encounter for surgical aftercare following surgery on the circulatory system: Secondary | ICD-10-CM

## 2011-03-11 DIAGNOSIS — I739 Peripheral vascular disease, unspecified: Secondary | ICD-10-CM

## 2011-03-13 ENCOUNTER — Ambulatory Visit (HOSPITAL_BASED_OUTPATIENT_CLINIC_OR_DEPARTMENT_OTHER)
Admission: RE | Admit: 2011-03-13 | Discharge: 2011-03-13 | Disposition: A | Discharge status: Home / Self Care | Payer: PRIVATE HEALTH INSURANCE | Source: Ambulatory Visit | Attending: Ophthalmology | Admitting: Ophthalmology

## 2011-03-13 ENCOUNTER — Encounter (HOSPITAL_BASED_OUTPATIENT_CLINIC_OR_DEPARTMENT_OTHER): Payer: Self-pay

## 2011-03-13 ENCOUNTER — Other Ambulatory Visit: Payer: Self-pay | Admitting: Ophthalmology

## 2011-03-13 ENCOUNTER — Encounter: Payer: Self-pay | Admitting: Ophthalmology

## 2011-03-13 ENCOUNTER — Encounter (HOSPITAL_BASED_OUTPATIENT_CLINIC_OR_DEPARTMENT_OTHER)
Admission: RE | Disposition: A | Discharge status: Home / Self Care | Payer: Self-pay | Source: Ambulatory Visit | Attending: Ophthalmology

## 2011-03-13 DIAGNOSIS — H0019 Chalazion unspecified eye, unspecified eyelid: Secondary | ICD-10-CM | POA: Insufficient documentation

## 2011-03-13 HISTORY — PX: CHALAZION EXCISION: SHX213

## 2011-03-13 SURGERY — MINOR EXCISION OF CHALAZION
Anesthesia: LOCAL | Site: Eye | Laterality: Right | Wound class: Clean Contaminated

## 2011-03-13 MED ORDER — BACITRACIN-POLYMYXIN B 500-10000 UNIT/GM OP OINT
TOPICAL_OINTMENT | OPHTHALMIC | Status: DC | PRN
  Administered 2011-03-13: 1 via OPHTHALMIC

## 2011-03-13 MED ORDER — LIDOCAINE HCL 1 % IJ SOLN
INTRAMUSCULAR | Status: DC | PRN
  Administered 2011-03-13: 2 mL

## 2011-03-13 SURGICAL SUPPLY — 18 items
APPLICATOR COTTON TIP 6IN STRL (MISCELLANEOUS) ×2 IMPLANT
BLADE SURG 11 STRL SS (BLADE) ×2 IMPLANT
CAUTERY EYE LOW TEMP 1300F FIN (OPHTHALMIC RELATED) IMPLANT
CLOTH BEACON ORANGE TIMEOUT ST (SAFETY) ×2 IMPLANT
GAUZE SPONGE 4X4 12PLY STRL LF (GAUZE/BANDAGES/DRESSINGS) ×8 IMPLANT
GLOVE BIO SURGEON STRL SZ 6.5 (GLOVE) ×2 IMPLANT
GLOVE ECLIPSE 7.0 STRL STRAW (GLOVE) ×2 IMPLANT
MARKER SKIN DUAL TIP RULER LAB (MISCELLANEOUS) ×2 IMPLANT
NDL SAFETY ECLIPSE 18X1.5 (NEEDLE) ×1 IMPLANT
NEEDLE HYPO 18GX1.5 SHARP (NEEDLE) ×1
NEEDLE HYPO 25X5/8 SAFETYGLIDE (NEEDLE) ×2 IMPLANT
PAD ALCOHOL SWAB (MISCELLANEOUS) ×2 IMPLANT
PAD EYE OVAL STERILE LF (GAUZE/BANDAGES/DRESSINGS) ×8 IMPLANT
SUT SILK 6 0 P 1 (SUTURE) IMPLANT
SWABSTICK POVIDONE IODINE SNGL (MISCELLANEOUS) IMPLANT
SYR CONTROL 10ML LL (SYRINGE) ×2 IMPLANT
TOWEL OR 17X24 6PK STRL BLUE (TOWEL DISPOSABLE) ×2 IMPLANT
UNDERPAD 30X30 INCONTINENT (UNDERPADS AND DIAPERS) ×2 IMPLANT

## 2011-03-13 NOTE — Brief Op Note (Signed)
03/13/2011  7:38 AM  PATIENT:  Retia Passe  66 y.o. male  PRE-OPERATIVE DIAGNOSIS:  chalazion rigt eye lower lid  POST-OPERATIVE DIAGNOSIS:  * No post-op diagnosis entered *  PROCEDURE:  Procedure(s) (LRB): MINOR EXCISION OF CHALAZION (Right)  SURGEON:  Surgeon(s) and Role:    Vita Erm., MD - Primary  PHYSICIAN ASSISTANT:   ASSISTANTS: none   ANESTHESIA:   local  EBL:     BLOOD ADMINISTERED:none  DRAINS: none   LOCAL MEDICATIONS USED:  LIDOCAINE   SPECIMEN:  No Specimen  DISPOSITION OF SPECIMEN:  N/A  COUNTS:  YES  TOURNIQUET:  * No tourniquets in log *  DICTATION: .Note written in EPIC  PLAN OF CARE: Discharge to home after PACU  PATIENT DISPOSITION:  Short Stay   Delay start of Pharmacological VTE agent (>24hrs) due to surgical blood loss or risk of bleeding: not applicable

## 2011-03-13 NOTE — Brief Op Note (Signed)
03/13/2011  12:38 PM  PATIENT:  [name] [name]  66 y.o. male  PRE-OPERATIVE DIAGNOSIS:  chalazion rigt eye upper lid  POST-OPERATIVE DIAGNOSIS:  *same  PROCEDURE:  Procedure(s) (LRB): MINOR EXCISION OF CHALAZION (Right)  SURGEON:  Surgeon(s) and Role:    Vita Erm., MD - Primary  PHYSICIAN ASSISTANT:   ASSISTANTS: none   ANESTHESIA:   local  EBL:   none  BLOOD ADMINISTERED:none  DRAINS: none   LOCAL MEDICATIONS USED:  LIDOCAINE 4cc  SPECIMEN:  No Specimen  DISPOSITION OF SPECIMEN:  N/A  COUNTS:  YES  TOURNIQUET:  * No tourniquets in log *  DICTATION: .Note written in EPIC  PLAN OF CARE: Discharge to home after PACU  PATIENT DISPOSITION:  Short Stay   Delay start of Pharmacological VTE agent (>24hrs) due to surgical blood loss or risk of bleeding: not applicable

## 2011-03-13 NOTE — H&P (Signed)
  66 yo  Male with chalazion upper lid right eye.  Lesion pruritic and seems to be increasing in size.  Admitted for chalazion excision.

## 2011-03-13 NOTE — Op Note (Signed)
[facility]                                 Minor Surgery Room Physician's Record                                  [ x ] Minor Procedure    [  ]Yag Laser           03/13/2011   Brief History/ Finding   66 yo male for excision mass upper lid right eye. Diagnosis;  Chalazion upper lid right eye  Local Anesthetic : Local  Procedure:  Chalazion Excision upper lid right eye    Specimen Removed: (Type & Number)   Disposition:  [ ]  Pathology, Routine                        [ x ] Other     [  ] Disposed of  Condition of Patient Post Procedure: [x  ]  Stable  [  ] Other     Discharge Instructions:  [ x ]  Diet , regular   [  ] Other          Activity: [ x ] No restrictions  [  ] Other          Medication: tylenol          Follow-up: 1 week          Other: none  Time:         No name on file.

## 2011-03-13 NOTE — Brief Op Note (Signed)
03/13/2011  7:27 AM  PATIENT:  [name] [name]  66 y.o. male  PRE-OPERATIVE DIAGNOSIS:  chalazion rigt eye upper  lid  POST-OPERATIVE DIAGNOSIS:   Same *  PROCEDURE:  Procedure(s) (LRB): MINOR EXCISION OF CHALAZION (Right)  SURGEON:  Surgeon(s) and Role:    Vita Erm., MD - Primary  PHYSICIAN ASSISTANT:   ASSISTANTS: none   ANESTHESIA:   local  EBL:     BLOOD ADMINISTERED:none  DRAINS: none   LOCAL MEDICATIONS USED:  LIDOCAINE   SPECIMEN:  No Specimen  DISPOSITION OF SPECIMEN:  N/A  COUNTS:  YES and NO Yes  TOURNIQUET:  * No tourniquets in log *  DICTATION: .Note written in EPIC  PLAN OF CARE: Discharge to home after PACU  PATIENT DISPOSITION:  Short Stay   Delay start of Pharmacological VTE agent (>24hrs) due to surgical blood loss or risk of bleeding: not applicable

## 2011-03-13 NOTE — H&P (Signed)
  66 yo male has chalazion upper lid right eye.  Lesion pruritic and seems to be increasing  In size.  Admitted for chalazion excision.

## 2011-03-13 NOTE — H&P (Signed)
  Remove patch tomorrow.  Then resume drops to left eye 4 x day.  Call office for appointment 1 week. Remove patch tomorrow.  Then resume drops to left eye 4 x day.  Call office for appointment 1 week. Remove patch tomorrow.  Then resume drops to left eye 4 x day.  Call office for appointment 1 week. Remove patch tomorrow.  Then resume drops to left eye 4 x day.  Call office for appointment 1 week. @10RELATIVEDAYS @ Routine excision of Chalazion @10RELATIVEDAYS @ Routine excision of Chalazion

## 2011-03-14 ENCOUNTER — Encounter (HOSPITAL_BASED_OUTPATIENT_CLINIC_OR_DEPARTMENT_OTHER): Payer: Self-pay | Admitting: Ophthalmology

## 2011-03-18 ENCOUNTER — Encounter: Payer: Self-pay | Admitting: Vascular Surgery

## 2011-03-18 NOTE — Procedures (Unsigned)
AORTA-ILIAC DUPLEX EVALUATION  INDICATION:  Follow up right iliofemoral angioplasty.  HISTORY: Diabetes:  No Cardiac:  No Hypertension:  Yes Smoking:  Yes Previous Surgery:  Right iliofemoral, common iliac and external iliac artery angioplasties              SINGLE LEVEL ARTERIAL EXAM                             RIGHT                  LEFT Brachial:                  131                    127 Anterior tibial:           80                     101 Posterior tibial:          83                     89 Peroneal: Ankle/brachial index:      0.63                   0.77 Previous ABI/date:         09/07/2010, 0.59       09/07/2010, 0.70  AORTA-ILIAC DUPLEX EXAM Aorta - Proximal     47 cm/s Aorta - Mid          45 cm/s Aorta - Distal       38 cm/s  RIGHT                                   LEFT 32 cm/s           CIA-PROXIMAL          37 cm/s 42 cm/s           CIA-DISTAL            67 cm/s not visualized    HYPOGASTRIC           not visualized 409 cm/s          EIA-PROXIMAL          90 cm/s 207 cm/s          EIA-MID               73 cm/s 85 cm/s           EIA-DISTAL            161 cm/s  IMPRESSION: 1. Patent right common iliac and external iliac artery angioplasty     sites with velocities as described above. 2. The bilateral ankle-brachial indices and Doppler waveforms suggest     moderately decreased perfusion at the bilateral lower extremities. 3. Stable bilateral ankle-brachial indices noted when compared to the     previous exam.  ___________________________________________ Fransisco Hertz, MD  EM/MEDQ  D:  03/12/2011  T:  03/12/2011  Job:  [id number]

## 2011-09-12 ENCOUNTER — Encounter: Payer: Self-pay | Admitting: Neurosurgery

## 2011-09-13 ENCOUNTER — Ambulatory Visit: Payer: 59 | Admitting: Neurosurgery

## 2011-09-13 ENCOUNTER — Ambulatory Visit (INDEPENDENT_AMBULATORY_CARE_PROVIDER_SITE_OTHER): Payer: 59 | Admitting: *Deleted

## 2011-09-13 ENCOUNTER — Encounter (INDEPENDENT_AMBULATORY_CARE_PROVIDER_SITE_OTHER): Payer: 59 | Admitting: *Deleted

## 2011-09-13 DIAGNOSIS — Z48812 Encounter for surgical aftercare following surgery on the circulatory system: Secondary | ICD-10-CM

## 2011-09-13 DIAGNOSIS — I739 Peripheral vascular disease, unspecified: Secondary | ICD-10-CM

## 2011-09-17 ENCOUNTER — Encounter: Payer: Self-pay | Admitting: Neurosurgery

## 2011-09-18 ENCOUNTER — Ambulatory Visit (INDEPENDENT_AMBULATORY_CARE_PROVIDER_SITE_OTHER): Payer: 59 | Admitting: Neurosurgery

## 2011-09-18 ENCOUNTER — Encounter: Payer: Self-pay | Admitting: Neurosurgery

## 2011-09-18 VITALS — BP 136/74 | HR 75 | Resp 18 | Ht 67.0 in | Wt 125.0 lb

## 2011-09-18 DIAGNOSIS — I779 Disorder of arteries and arterioles, unspecified: Secondary | ICD-10-CM

## 2011-09-18 DIAGNOSIS — I7409 Other arterial embolism and thrombosis of abdominal aorta: Secondary | ICD-10-CM | POA: Insufficient documentation

## 2011-09-18 DIAGNOSIS — Z48812 Encounter for surgical aftercare following surgery on the circulatory system: Secondary | ICD-10-CM

## 2011-09-18 NOTE — Addendum Note (Signed)
Addended by: Sharee Pimple on: 09/18/2011 11:32 AM   Modules accepted: Orders

## 2011-09-18 NOTE — Progress Notes (Signed)
[facility] Office Note  CC: [facility] surveillance Referring Physician: Imogene Burn  History of Present Illness: 66 year old male patient of Dr. Imogene Burn seen for followup of bilateral iliac stenting and subsequent angioplasty last April 2012 by Dr. Imogene Burn. The patient reports "constant" lower extremity pain bilaterally that is equal, the patient also states he cannot sleep due to rest pain in the lower extremities. The patient denies any new medical diagnoses or recent surgery, however there is operative note in Epic from Dr. Mitzi Davenport regarding an orbital procedure.  Past Medical History  Diagnosis Date  . Peripheral vascular disease     with claudication  . Pancreatitis     chronic  . Hypertension   . Glaucoma   . COPD (chronic obstructive pulmonary disease)   . Ulcer     peptic ulcer disease  . H/O alcohol abuse   . History of tobacco abuse     ROS: [x]  Positive   [ ]  Denies    General: [ ]  Weight loss, [ ]  Fever, [ ]  chills Neurologic: [ ]  Dizziness, [ ]  Blackouts, [ ]  Seizure [ ]  Stroke, [ ]  "Mini stroke", [ ]  Slurred speech, [ ]  Temporary blindness; [ ]  weakness in arms or legs, [ ]  Hoarseness Cardiac: [ ]  Chest pain/pressure, [ ]  Shortness of breath at rest [ ]  Shortness of breath with exertion, [ ]  Atrial fibrillation or irregular heartbeat Vascular: [ ]  Pain in legs with walking, [ ]  Pain in legs at rest, [ ]  Pain in legs at night,  [ ]  Non-healing ulcer, [ ]  Blood clot in vein/DVT,   Pulmonary: [ ]  Home oxygen, [ ]  Productive cough, [ ]  Coughing up blood, [ ]  Asthma,  [ ]  Wheezing Musculoskeletal:  [ ]  Arthritis, [ ]  Low back pain, [ ]  Joint pain Hematologic: [ ]  Easy Bruising, [ ]  Anemia; [ ]  Hepatitis Gastrointestinal: [ ]  Blood in stool, [ ]  Gastroesophageal Reflux/heartburn, [ ]  Trouble swallowing Urinary: [ ]  chronic Kidney disease, [ ]  on HD - [ ]  MWF or [ ]  TTHS, [ ]  Burning with urination, [ ]  Difficulty urinating Skin: [ ]  Rashes, [ ]   Wounds Psychological: [ ]  Anxiety, [ ]  Depression   Social History History  Substance Use Topics  . Smoking status: Current Everyday Smoker -- 1.0 packs/day for 60 years    Types: Cigarettes  . Smokeless tobacco: Not on file  . Alcohol Use: No     previous history of heavy alcohol use    Family History History reviewed. No pertinent family history.  Allergies  Allergen Reactions  . Ibuprofen   . Omnipaque (Iohexol)      Code: HIVES, Desc: FACIAL HIVES S/P IV CONTRAST.Marland KitchenMarland KitchenOK W/ 25MG  BENADRYL//A.C., Onset Date: 16109604   . Shellfish Allergy     Current Outpatient Prescriptions  Medication Sig Dispense Refill  . ALBUTEROL IN Inhale into the lungs. 2 puffs inhaled every 4 hrs as needed       . aliskiren (TEKTURNA) 300 MG tablet Take 300 mg by mouth daily.        Marland Kitchen amLODipine (NORVASC) 10 MG tablet       . amLODipine-olmesartan (AZOR) 10-40 MG per tablet Take 1 tablet by mouth daily.        Marland Kitchen aspirin 325 MG EC tablet Take 325 mg by mouth daily.        Marland Kitchen COLCRYS 0.6 MG tablet       . cyclobenzaprine (FLEXERIL) 10 MG tablet Take  10 mg by mouth 3 (three) times daily as needed.        . ferrous sulfate 325 (65 FE) MG tablet Take 325 mg by mouth 3 (three) times daily after meals.        . hydrochlorothiazide (,MICROZIDE/HYDRODIURIL,) 12.5 MG capsule       . HYDROcodone-acetaminophen (VICODIN ES) 7.5-750 MG per tablet       . HYDROcodone-ibuprofen (VICOPROFEN) 7.5-200 MG per tablet Take 1 tablet by mouth every 6 (six) hours.        Marland Kitchen lisinopril (PRINIVIL,ZESTRIL) 40 MG tablet       . nitroGLYCERIN (NITROSTAT) 0.4 MG SL tablet Place 0.4 mg under the tongue every 5 (five) minutes as needed.        Marland Kitchen PROAIR HFA 108 (90 BASE) MCG/ACT inhaler         Physical Examination  Filed Vitals:   09/18/11 0910  BP: 136/74  Pulse: 75  Resp: 18    Body mass index is 19.58 kg/(m^2).  General:  WDWN in NAD Gait: Normal HEENT: WNL Eyes: Pupils equal Pulmonary: normal non-labored breathing  , without Rales, rhonchi,  wheezing Cardiac: RRR, without  Murmurs, rubs or gallops; No carotid bruits Abdomen: soft, NT, no masses Skin: no rashes, ulcers noted Vascular Exam/Pulses: Lower extremity pulses are not palpable bilaterally  Extremities without ischemic changes, no Gangrene , no cellulitis; no open wounds;  Musculoskeletal: no muscle wasting or atrophy  Neurologic: A&O X 3; Appropriate Affect ; SENSATION: normal; MOTOR FUNCTION:  moving all extremities equally. Speech is fluent/normal  Non-Invasive Vascular Imaging: ABIs today are 0.63 and monophasic on the right, 0.80 and monophasic on the left, vascular lab duplex shows elevations within the iliac stents of 385 on the left, 482 on the right.  ASSESSMENT/PLAN: Dr. Edilia Bo spoke with the patient as well as I regarding possible intervention, the patient declined any kind of intervention and asked for pain medicine and something for sleep. Dr. Edilia Bo explained to the patient that a dye study would be needed to determine the extent of the patient's vascular disease and to determine what may be offered for treatment. The patient also told me that he knew where to get pain medicine if we were not going to help him. I explained to the patient that we did not feel that pain medication was the answer and I did not believe that any physician this practice would prescribe pain medicine when intervention can help. Again the patient declined all offered options and left. I did recommend the patient return in 3 months at the minimum and have repeat ABIs and iliac stent duplex,. again the patient declined leaving the exam room without further comment.

## 2011-10-10 ENCOUNTER — Other Ambulatory Visit: Payer: Self-pay | Admitting: Otolaryngology

## 2011-11-27 ENCOUNTER — Encounter: Payer: Self-pay | Admitting: Internal Medicine

## 2011-12-09 ENCOUNTER — Encounter: Payer: Self-pay | Admitting: Internal Medicine

## 2011-12-10 ENCOUNTER — Ambulatory Visit (INDEPENDENT_AMBULATORY_CARE_PROVIDER_SITE_OTHER): Payer: 59 | Admitting: Internal Medicine

## 2011-12-10 ENCOUNTER — Other Ambulatory Visit (INDEPENDENT_AMBULATORY_CARE_PROVIDER_SITE_OTHER): Payer: 59

## 2011-12-10 ENCOUNTER — Encounter: Payer: Self-pay | Admitting: Internal Medicine

## 2011-12-10 VITALS — BP 134/80 | HR 76 | Ht 67.0 in | Wt 130.6 lb

## 2011-12-10 DIAGNOSIS — Z1211 Encounter for screening for malignant neoplasm of colon: Secondary | ICD-10-CM

## 2011-12-10 DIAGNOSIS — R1011 Right upper quadrant pain: Secondary | ICD-10-CM

## 2011-12-10 DIAGNOSIS — D649 Anemia, unspecified: Secondary | ICD-10-CM

## 2011-12-10 LAB — CBC
HCT: 39.7 % (ref 39.0–52.0)
Hemoglobin: 13.1 g/dL (ref 13.0–17.0)
RBC: 4.38 Mil/uL (ref 4.22–5.81)
WBC: 5.3 10*3/uL (ref 4.5–10.5)

## 2011-12-10 LAB — IBC PANEL: Saturation Ratios: 13.3 % — ABNORMAL LOW (ref 20.0–50.0)

## 2011-12-10 LAB — FERRITIN: Ferritin: 275.8 ng/mL (ref 22.0–322.0)

## 2011-12-10 MED ORDER — PEG-KCL-NACL-NASULF-NA ASC-C 100 G PO SOLR: 1.0000 | Freq: Once | ORAL | Status: DC

## 2011-12-10 NOTE — Progress Notes (Signed)
Patient ID: [name] [name], male   DOB: Jan 07, 1946, 66 y.o.   MRN: [id number]  SUBJECTIVE: HPI Mr. 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 Shana Chute for evaluation of anemia and colon cancer screening. The patient is uncertain why he is here today, but thinks it is to discuss "having the colon looked at".  The patient reports that his bowel habits have been regular and he is having one bowel movement per day. He doesn't know if his stools containing blood and blames this on his bad vision. He does not think he is having black or tarry stool. He denies diarrhea or constipation. Denies abdominal pain today. He does report occasional right upper quadrant pain which he reports as a "grabbing or cramping" pain. Occasionally this does wake him from sleep.  He does not necessarily associate it with eating or bowel movement Denies heartburn, dysphagia or odynophagia. No nausea or vomiting. Reports good appetite. He reports a stable weight but overall notes his weight fluctuated between 120 and 135. 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 He reports he used to drink heavily and now drinks very rarely. He does recall an upper endoscopy, performed in the late 80s he feels by Dr. Corinda Gubler for evaluation of "bad heartburn".  He reports no ongoing issues with heartburn at present.  Review of Systems  As per history of present illness, otherwise negative   Past Medical History  Diagnosis Date  . Peripheral vascular disease     with claudication  . Pancreatitis     chronic  . Hypertension   . Glaucoma   . COPD (chronic obstructive pulmonary disease)   . Ulcer     peptic ulcer disease  . H/O alcohol abuse   . History of tobacco abuse      Current Outpatient Prescriptions  Medication Sig Dispense Refill  . amLODipine (NORVASC) 10 MG tablet       . aspirin 325 MG EC tablet Take 325 mg by mouth daily.        . cyclobenzaprine (FLEXERIL) 10 MG tablet Take 10 mg by mouth 3 (three) times daily as needed.        . hydrochlorothiazide (,MICROZIDE/HYDRODIURIL,) 12.5 MG capsule       . HYDROcodone-acetaminophen (VICODIN ES) 7.5-750 MG per tablet       . nitroGLYCERIN (NITROSTAT) 0.4 MG SL tablet Place 0.4 mg under the tongue every 5 (five) minutes as needed.        Marland Kitchen PROAIR HFA 108 (90 BASE) MCG/ACT inhaler Inhale 2 puffs into the lungs every 4 (four) hours as needed.       . peg 3350 powder (MOVIPREP) 100 G SOLR Take 1 kit (100 g total) by mouth once.  1 kit  0    Allergies  Allergen Reactions  . Ibuprofen   . Omnipaque (Iohexol)      Code: HIVES, Desc: FACIAL HIVES S/P IV CONTRAST.Marland KitchenMarland KitchenOK W/ 25MG  BENADRYL//A.C., Onset Date: 40981191   . Shellfish Allergy     Family History  Problem Relation Age of Onset  . Family history unknown: Yes    History  Substance Use Topics  . Smoking status: Current Every Day Smoker -- 1.0 packs/day for 60 years    Types: Cigarettes  . Smokeless tobacco: Never  Used  . Alcohol Use: Yes     Comment: previous history of heavy alcohol use    OBJECTIVE: BP 134/80  Pulse 76  Ht 5\' 7"  (1.702 m)  Wt 130 lb 9.6 oz (59.24 kg)  BMI 20.45 kg/m2 Constitutional: Well-developed and well-nourished. No distress. HEENT: Normocephalic and atraumatic. Oropharynx is clear and moist. No oropharyngeal exudate. Conjunctivae are normal. No scleral icterus. Neck: Neck supple. Trachea midline. Cardiovascular: Normal rate, regular rhythm and intact distal pulses. No M/R/G Pulmonary/chest: Effort normal and breath sounds normal. No wheezing, rales or rhonchi. Abdominal: Soft, nontender, nondistended. Bowel sounds active throughout. There are no masses palpable. No hepatosplenomegaly. Extremities: no clubbing,  cyanosis, or edema Lymphadenopathy: No cervical adenopathy noted. Neurological: Alert and oriented to person place and time. Skin: Skin is warm and dry. No rashes noted. Psychiatric: Normal mood and affect. Behavior is normal.  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 Shana Chute for evaluation of anemia and colon cancer screening  1.  CRC screening/anemia -- the patient's never had colorectal cancer screening based on age colonoscopy is appropriate. His anemia is mild and normocytic. I will repeat CBC today along with iron studies to determine if iron deficiency is an issue. If so, he will likely need an upper endoscopy at the same time as his colonoscopy. We discussed colonoscopy today at length including the risks and benefits and he is agreeable to proceed.  2.  Intermittent right upper quadrant pain -- unclear etiology, will order right upper quadrant an abdominal ultrasound to evaluate liver and rule out gallstones. He did have a comprehensive metabolic panel recently which was normal and this is reassuring.

## 2011-12-10 NOTE — Patient Instructions (Addendum)
You have been given a separate informational sheet regarding your tobacco use, the importance of quitting and local resources to help you quit.  You have been scheduled for a colonoscopy with propofol. Please follow written instructions given to you at your visit today.  Please pick up your prep kit at the pharmacy within the next 1-3 days. If you use inhalers (even only as needed) or a CPAP machine, please bring them with you on the day of your procedure.  Your physician has requested that you go to the basement for the following lab work before leaving today:IBC, Ferritin, CBC  You have been scheduled for an abdominal Ultrasound at [facility] on 12/12/2011 @ 9am  Please arrive at 8:45 and check in at Radiology.  Nothing to eat or drink after midnight prior to your Ultrasound.  If you need to reschedule this appointment please call [phone]

## 2011-12-12 ENCOUNTER — Other Ambulatory Visit: Payer: Self-pay | Admitting: Gastroenterology

## 2011-12-12 ENCOUNTER — Ambulatory Visit (HOSPITAL_COMMUNITY)
Admission: RE | Admit: 2011-12-12 | Discharge: 2011-12-12 | Disposition: A | Discharge status: Home / Self Care | Payer: PRIVATE HEALTH INSURANCE | Source: Ambulatory Visit | Attending: Internal Medicine | Admitting: Internal Medicine

## 2011-12-12 DIAGNOSIS — R1011 Right upper quadrant pain: Secondary | ICD-10-CM | POA: Insufficient documentation

## 2011-12-12 DIAGNOSIS — D649 Anemia, unspecified: Secondary | ICD-10-CM | POA: Insufficient documentation

## 2011-12-17 ENCOUNTER — Telehealth: Payer: Self-pay | Admitting: Internal Medicine

## 2011-12-17 ENCOUNTER — Other Ambulatory Visit: Payer: Self-pay | Admitting: Gastroenterology

## 2011-12-17 NOTE — Telephone Encounter (Signed)
Mailed updated instructions to pt.

## 2011-12-19 ENCOUNTER — Encounter: Payer: 59 | Admitting: Internal Medicine

## 2011-12-19 ENCOUNTER — Encounter: Payer: Self-pay | Admitting: Vascular Surgery

## 2011-12-20 ENCOUNTER — Encounter (INDEPENDENT_AMBULATORY_CARE_PROVIDER_SITE_OTHER): Payer: 59 | Admitting: *Deleted

## 2011-12-20 ENCOUNTER — Other Ambulatory Visit (INDEPENDENT_AMBULATORY_CARE_PROVIDER_SITE_OTHER): Payer: 59 | Admitting: *Deleted

## 2011-12-20 ENCOUNTER — Ambulatory Visit: Payer: 59 | Admitting: Vascular Surgery

## 2011-12-20 ENCOUNTER — Ambulatory Visit (INDEPENDENT_AMBULATORY_CARE_PROVIDER_SITE_OTHER): Payer: 59 | Admitting: Vascular Surgery

## 2011-12-20 ENCOUNTER — Other Ambulatory Visit: Payer: 59

## 2011-12-20 ENCOUNTER — Encounter: Payer: Self-pay | Admitting: Vascular Surgery

## 2011-12-20 VITALS — BP 132/84 | HR 55 | Resp 16 | Ht 67.0 in | Wt 128.4 lb

## 2011-12-20 DIAGNOSIS — Z48812 Encounter for surgical aftercare following surgery on the circulatory system: Secondary | ICD-10-CM

## 2011-12-20 DIAGNOSIS — I739 Peripheral vascular disease, unspecified: Secondary | ICD-10-CM

## 2011-12-20 DIAGNOSIS — I7409 Other arterial embolism and thrombosis of abdominal aorta: Secondary | ICD-10-CM
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
## 2011-12-20 NOTE — Addendum Note (Signed)
Addended by: Sharee Pimple on: 12/20/2011 02:15 PM   Modules accepted: Orders

## 2011-12-29 DIAGNOSIS — K635 Polyp of colon: Secondary | ICD-10-CM

## 2011-12-29 HISTORY — DX: Polyp of colon: K63.5

## 2012-01-03 ENCOUNTER — Ambulatory Visit: Payer: 59 | Admitting: Vascular Surgery

## 2012-01-06 ENCOUNTER — Telehealth: Payer: Self-pay | Admitting: Internal Medicine

## 2012-01-06 NOTE — Telephone Encounter (Signed)
Spoke to pt. Went over instructions for his procedure. He stated understanding

## 2012-01-07 ENCOUNTER — Encounter: Payer: Self-pay | Admitting: Internal Medicine

## 2012-01-07 ENCOUNTER — Ambulatory Visit (AMBULATORY_SURGERY_CENTER): Payer: 59 | Admitting: Internal Medicine

## 2012-01-07 VITALS — BP 128/73 | HR 55 | Temp 96.8°F | Resp 18 | Ht 67.0 in | Wt 130.0 lb

## 2012-01-07 DIAGNOSIS — D649 Anemia, unspecified: Secondary | ICD-10-CM

## 2012-01-07 DIAGNOSIS — D126 Benign neoplasm of colon, unspecified: Secondary | ICD-10-CM

## 2012-01-07 DIAGNOSIS — Z1211 Encounter for screening for malignant neoplasm of colon: Secondary | ICD-10-CM

## 2012-01-07 MED ORDER — SODIUM CHLORIDE 0.9 % IV SOLN: 500.0000 mL | INTRAVENOUS | Status: DC

## 2012-01-07 NOTE — Op Note (Signed)
[facility] [address] [place], [address]   COLONOSCOPY PROCEDURE REPORT  PATIENT: [name], [name]  MR#: [id number] BIRTHDATE: 02-Jul-1945 , 66  yrs. old GENDER: Male ENDOSCOPIST: Beverley Fiedler, MD REFERRED [id number], Kevin Fenton PROCEDURE DATE:  01/07/2012 PROCEDURE:   Colonoscopy with cold biopsy polypectomy and Colonoscopy with snare polypectomy ASA CLASS:   Class III INDICATIONS:average risk screening and Anemia, non-specific. MEDICATIONS: MAC sedation, administered by CRNA and propofol (Diprivan) 200mg  IV  DESCRIPTION OF PROCEDURE:   After the risks benefits and alternatives of the procedure were thoroughly explained, informed consent was obtained.  A digital rectal exam revealed no rectal mass.   The LB CF-[id number]  endoscope was introduced through the anus and advanced to the   . No adverse events experienced. The quality of the prep was good, using MoviPrep  The instrument was then slowly withdrawn as the colon was fully examined.   COLON FINDINGS: Two sessile polyps measuring 2-4 mm in size were found at the cecum.  Polypectomy was performed with cold forceps (1) and using cold snare (1).  All resections were complete and all polyp tissue was completely retrieved.   Three sessile polyps measuring 2-4 mm in size were found in the distal sigmoid colon and rectum.  Polypectomy was performed with cold forceps.  All resections were complete and all polyp tissue was completely retrieved.   There was mild scattered diverticulosis noted in the ascending colon, transverse colon, descending colon, and sigmoid colon.   Retroflexed views revealed internal hemorrhoids. The time to cecum=4 minutes 09 seconds.  Withdrawal time=13 minutes 27 seconds minutes 0 seconds.  The scope was withdrawn and the procedure completed. COMPLICATIONS: There were no complications.  ENDOSCOPIC IMPRESSION: 1.   Two sessile polyps measuring 2-4 mm in size were found at the cecum;  Polypectomy was performed with cold forceps and using cold snare 2.   Three sessile polyps measuring 2-4 mm in size were found in the distal sigmoid colon and rectum; Polypectomy was performed with cold forceps 3.   There was mild diverticulosis noted in the ascending colon, transverse colon, descending colon, and sigmoid colon 4.   Small internal hemorrhoids  RECOMMENDATIONS: 1.  Await pathology results 2.  Hold aspirin, aspirin products, and anti-inflammatory medication for 1 week. 3.  High fiber diet 4.  Timing of repeat colonoscopy will be determined by pathology findings. 5.  You will receive a letter within 1-2 weeks with the results of your biopsy as well as final recommendations.  Please call my office if you have not received a letter after 3 weeks.   eSigned:  Beverley Fiedler, MD 01/07/2012 11:10 AM   cc: Donia Guiles, MD and The Patient   PATIENT NAME:  [name], [name] MR#: [id number]

## 2012-01-07 NOTE — Progress Notes (Signed)
Propofol given over incremental dosages 

## 2012-01-07 NOTE — Patient Instructions (Addendum)
YOU HAD AN ENDOSCOPIC PROCEDURE TODAY AT THE [facility]: Refer to the procedure report that was given to you for any specific questions about what was found during the examination.  If the procedure report does not answer your questions, please call your gastroenterologist to clarify.  If you requested that your care partner not be given the details of your procedure findings, then the procedure report has been included in a sealed envelope for you to review at your convenience later.  YOU SHOULD EXPECT: Some feelings of bloating in the abdomen. Passage of more gas than usual.  Walking can help get rid of the air that was put into your GI tract during the procedure and reduce the bloating. If you had a lower endoscopy (such as a colonoscopy or flexible sigmoidoscopy) you may notice spotting of blood in your stool or on the toilet paper. If you underwent a bowel prep for your procedure, then you may not have a normal bowel movement for a few days.  DIET: Your first meal following the procedure should be a light meal and then it is ok to progress to your normal diet.  A half-sandwich or bowl of soup is an example of a good first meal.  Heavy or fried foods are harder to digest and may make you feel nauseous or bloated.  Likewise meals heavy in dairy and vegetables can cause extra gas to form and this can also increase the bloating.  Drink plenty of fluids but you should avoid alcoholic beverages for 24 hours.  ACTIVITY: Your care partner should take you home directly after the procedure.  You should plan to take it easy, moving slowly for the rest of the day.  You can resume normal activity the day after the procedure however you should NOT DRIVE or use heavy machinery for 24 hours (because of the sedation medicines used during the test).    SYMPTOMS TO REPORT IMMEDIATELY: A gastroenterologist can be reached at any hour.  During normal business hours, 8:30 AM to 5:00 PM Monday through Friday,  call [phone].  After hours and on weekends, please call the GI answering service at [phone] who will take a message and have the physician on call contact you.   Following lower endoscopy (colonoscopy or flexible sigmoidoscopy):  Excessive amounts of blood in the stool  Significant tenderness or worsening of abdominal pains  Swelling of the abdomen that is new, acute  Fever of 100F or higher  Following upper endoscopy (EGD)  Vomiting of blood or coffee ground material  New chest pain or pain under the shoulder blades  Painful or persistently difficult swallowing  New shortness of breath  Fever of 100F or higher  Black, tarry-looking stools  FOLLOW UP: If any biopsies were taken you will be contacted by phone or by letter within the next 1-3 weeks.  Call your gastroenterologist if you have not heard about the biopsies in 3 weeks.  Our staff will call the home number listed on your records the next business day following your procedure to check on you and address any questions or concerns that you may have at that time regarding the information given to you following your procedure. This is a courtesy call and so if there is no answer at the home number and we have not heard from you through the emergency physician on call, we will assume that you have returned to your regular daily activities without incident.  SIGNATURES/CONFIDENTIALITY: You and/or your care   partner have signed paperwork which will be entered into your electronic medical record.  These signatures attest to the fact that that the information above on your After Visit Summary has been reviewed and is understood.  Full responsibility of the confidentiality of this discharge information lies with you and/or your care-partner.  

## 2012-01-07 NOTE — Progress Notes (Signed)
Patient did not have preoperative order for IV antibiotic SSI prophylaxis. (G8918)  Patient did not experience any of the following events: a burn prior to discharge; a fall within the facility; wrong site/side/patient/procedure/implant event; or a hospital transfer or hospital admission upon discharge from the facility. (G8907)  

## 2012-01-07 NOTE — Progress Notes (Signed)
Called to room to assist during endoscopic procedure.  Patient ID and intended procedure confirmed with present staff. Received instructions for my participation in the procedure from the performing physician.  

## 2012-01-08 ENCOUNTER — Telehealth: Payer: Self-pay

## 2012-01-08 NOTE — Telephone Encounter (Signed)
  Follow up Call-  Call back number 01/07/2012  Post procedure Call Back phone  # [phone]  Permission to leave phone message Yes     Patient questions:  Do you have a fever, pain , or abdominal swelling? no Pain Score  0 *  Have you tolerated food without any problems? yes  Have you been able to return to your normal activities? yes  Do you have any questions about your discharge instructions: Diet   no Medications  no Follow up visit  no  Do you have questions or concerns about your Care? no  Actions: * If pain score is 4 or above: No action needed, pain <4.

## 2012-01-13 ENCOUNTER — Encounter: Payer: Self-pay | Admitting: Internal Medicine

## 2012-01-15 ENCOUNTER — Telehealth: Payer: Self-pay | Admitting: Internal Medicine

## 2012-01-15 NOTE — Telephone Encounter (Signed)
Discussed path letter with pt and that his polyps were not cancerous, but could have developed into cancer if not removed. Therefore, he needs to have a COLON in 5 years and his recall is in. Pt stated understanding.

## 2012-02-07 ENCOUNTER — Other Ambulatory Visit: Payer: 59

## 2012-02-07 ENCOUNTER — Ambulatory Visit: Payer: 59 | Admitting: Vascular Surgery

## 2012-03-19 ENCOUNTER — Encounter: Payer: Self-pay | Admitting: Vascular Surgery

## 2012-03-20 ENCOUNTER — Encounter (INDEPENDENT_AMBULATORY_CARE_PROVIDER_SITE_OTHER): Payer: 59 | Admitting: *Deleted

## 2012-03-20 ENCOUNTER — Ambulatory Visit (INDEPENDENT_AMBULATORY_CARE_PROVIDER_SITE_OTHER): Payer: 59 | Admitting: Vascular Surgery

## 2012-03-20 ENCOUNTER — Encounter: Payer: Self-pay | Admitting: Vascular Surgery

## 2012-03-20 ENCOUNTER — Other Ambulatory Visit: Payer: 59 | Admitting: *Deleted

## 2012-03-20 VITALS — BP 123/73 | HR 55 | Ht 67.0 in | Wt 129.0 lb

## 2012-03-20 DIAGNOSIS — I70219 Atherosclerosis of native arteries of extremities with intermittent claudication, unspecified extremity: Secondary | ICD-10-CM | POA: Insufficient documentation

## 2012-03-20 DIAGNOSIS — Z48812 Encounter for surgical aftercare following surgery on the circulatory system: Secondary | ICD-10-CM

## 2012-03-20 DIAGNOSIS — I739 Peripheral vascular disease, unspecified: Secondary | ICD-10-CM

## 2012-03-20 NOTE — Progress Notes (Signed)
[facility] [place]  Established Intermittent Claudication  History of Present Illness  [name] [name] is a 67 y.o. (1945/03/18) male who presents with chief complaint: short distance claudication in L leg.  The patient's symptoms have not progressed.  The patient's symptoms are: cramping with short distances, L>R.  The patient's treatment regimen currently included: maximal medical management.  The patient continues to smoke.  Past Medical History, Past Surgical History, Social History, Family History, Medications, Allergies, and Review of Systems are unchanged from previous evaluation on 12/20/11.  Physical Examination  Filed Vitals:   03/20/12 1042  BP: 123/73  Pulse: 55  Height: 5\' 7"  (1.702 m)  Weight: 129 lb (58.514 kg)  SpO2: 100%   Body mass index is 20.2 kg/(m^2).  General: A&O x 3, WDWN ,thin   Pulmonary: Sym exp, good air movt, CTAB, no rales, rhonchi, & wheezing   Cardiac: RRR, Nl S1, S2, no Murmurs, rubs or gallops   Vascular:  Vessel  Right  Left   Radial  Palpable  Palpable   Ulnar  Palpable  Palpable   Brachial  Palpable  Palpable   Carotid  Palpable, without bruit  Palpable, without bruit   Aorta  Not palpable  N/A   Femoral  Palpable  Palpable   Popliteal  Not palpable  Not palpable   PT  Not Palpable  Not Palpable   DP  Not Palpable  Not Palpable    Musculoskeletal: M/S 5/5 throughout , Extremities without ischemic changes   Neurologic: Pain and light touch intact in extremities , Motor exam as listed above   Non-Invasive Vascular Imaging  ABI (Date: 03/20/12)  RLE: 0.56, PT and DP: monophasic  LLE: 0.68, PT and DP: monophasic  Aortoiliac Duplex (Date: 03/20/12)  Ao: 22 c/s c/s  R iliac: 32-328 c/s (prev PSV 367 c/s) L iliac: 34-344 c/s (prev PSV 334 c/s)  Medical Decision Making  [name] [name] is a 67 y.o. male who presents with: B leg intermittent claudication without evidence of critical limb ischemia.  Based on the  patient's vascular studies and examination, I have offered the patient: aortogram and bilateral leg runoff with possible intervention  I discussed with the patient the nature of angiographic procedures, especially the limited patencies of any endovascular intervention. The patient is aware of that the risks of an angiographic procedure include but are not limited to: bleeding, infection, access site complications, renal failure, embolization, rupture of vessel, dissection, possible need for emergent surgical intervention, possible need for surgical procedures to treat the patient's pathology, and stroke and death.  I discussed in depth with the patient the nature of atherosclerosis, and emphasized the importance of maximal medical management including strict control of blood pressure, blood glucose, and lipid levels, antiplatelet agents, obtaining regular exercise, and cessation of smoking. The patient is aware that without maximal medical management the underlying atherosclerotic disease process will progress, limiting the benefit of any interventions.  The patient again is hesitant to proceed.  I reiterated the importance of keeping his iliac stent patent in order to avoid the need for an aortobifemoral bypass. I again reiterated the importance of smoking cessation.  I am reluctant to offer any further surgical intervention until he discontinues smoking.  He is not interested in pharmacologic assistance due concern with the cost. We will repeat his ABI and Aortoiliac study in 3 months to see if his PAD has progressed. Thank you for allowing [place] to participate in this patient's care.  Leonides Sake,  MD [facility] [place] Office: [phone] Pager: [phone]  03/20/2012, 5:46 PM

## 2012-03-23 ENCOUNTER — Other Ambulatory Visit: Payer: Self-pay | Admitting: *Deleted

## 2012-03-23 DIAGNOSIS — I739 Peripheral vascular disease, unspecified: Secondary | ICD-10-CM

## 2012-03-23 DIAGNOSIS — Z48812 Encounter for surgical aftercare following surgery on the circulatory system: Secondary | ICD-10-CM

## 2012-06-04 ENCOUNTER — Encounter: Payer: Self-pay | Admitting: Vascular Surgery

## 2012-06-05 ENCOUNTER — Encounter: Payer: Self-pay | Admitting: Vascular Surgery

## 2012-06-05 ENCOUNTER — Encounter (INDEPENDENT_AMBULATORY_CARE_PROVIDER_SITE_OTHER): Payer: 59 | Admitting: *Deleted

## 2012-06-05 ENCOUNTER — Other Ambulatory Visit (INDEPENDENT_AMBULATORY_CARE_PROVIDER_SITE_OTHER): Payer: 59 | Admitting: *Deleted

## 2012-06-05 ENCOUNTER — Ambulatory Visit (INDEPENDENT_AMBULATORY_CARE_PROVIDER_SITE_OTHER): Payer: 59 | Admitting: Vascular Surgery

## 2012-06-05 VITALS — BP 143/83 | HR 83 | Resp 16 | Ht 67.0 in | Wt 126.0 lb

## 2012-06-05 DIAGNOSIS — I739 Peripheral vascular disease, unspecified: Secondary | ICD-10-CM

## 2012-06-05 DIAGNOSIS — Z48812 Encounter for surgical aftercare following surgery on the circulatory system: Secondary | ICD-10-CM

## 2012-06-05 DIAGNOSIS — I7409 Other arterial embolism and thrombosis of abdominal aorta: Secondary | ICD-10-CM

## 2012-06-05 DIAGNOSIS — M79609 Pain in unspecified limb: Secondary | ICD-10-CM | POA: Insufficient documentation
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
## 2012-07-15 ENCOUNTER — Other Ambulatory Visit: Payer: Self-pay

## 2012-07-24 ENCOUNTER — Encounter (HOSPITAL_COMMUNITY): Payer: Self-pay

## 2012-08-03 ENCOUNTER — Encounter (HOSPITAL_COMMUNITY)
Admission: RE | Disposition: A | Discharge status: Home / Self Care | Payer: Self-pay | Source: Ambulatory Visit | Attending: Vascular Surgery

## 2012-08-03 ENCOUNTER — Ambulatory Visit (HOSPITAL_COMMUNITY)
Admission: RE | Admit: 2012-08-03 | Discharge: 2012-08-03 | Disposition: A | Discharge status: Home / Self Care | Payer: PRIVATE HEALTH INSURANCE | Source: Ambulatory Visit | Attending: Vascular Surgery | Admitting: Vascular Surgery

## 2012-08-03 DIAGNOSIS — I70219 Atherosclerosis of native arteries of extremities with intermittent claudication, unspecified extremity: Secondary | ICD-10-CM

## 2012-08-03 DIAGNOSIS — F1011 Alcohol abuse, in remission: Secondary | ICD-10-CM | POA: Insufficient documentation

## 2012-08-03 DIAGNOSIS — H409 Unspecified glaucoma: Secondary | ICD-10-CM | POA: Insufficient documentation

## 2012-08-03 DIAGNOSIS — T82898A Other specified complication of vascular prosthetic devices, implants and grafts, initial encounter: Secondary | ICD-10-CM | POA: Insufficient documentation

## 2012-08-03 DIAGNOSIS — I10 Essential (primary) hypertension: Secondary | ICD-10-CM | POA: Insufficient documentation

## 2012-08-03 DIAGNOSIS — Z91013 Allergy to seafood: Secondary | ICD-10-CM | POA: Insufficient documentation

## 2012-08-03 DIAGNOSIS — Z886 Allergy status to analgesic agent status: Secondary | ICD-10-CM | POA: Insufficient documentation

## 2012-08-03 DIAGNOSIS — Y831 Surgical operation with implant of artificial internal device as the cause of abnormal reaction of the patient, or of later complication, without mention of misadventure at the time of the procedure: Secondary | ICD-10-CM | POA: Insufficient documentation

## 2012-08-03 DIAGNOSIS — F17200 Nicotine dependence, unspecified, uncomplicated: Secondary | ICD-10-CM | POA: Insufficient documentation

## 2012-08-03 DIAGNOSIS — Z91041 Radiographic dye allergy status: Secondary | ICD-10-CM | POA: Insufficient documentation

## 2012-08-03 DIAGNOSIS — J4489 Other specified chronic obstructive pulmonary disease: Secondary | ICD-10-CM | POA: Insufficient documentation

## 2012-08-03 DIAGNOSIS — Z79899 Other long term (current) drug therapy: Secondary | ICD-10-CM | POA: Insufficient documentation

## 2012-08-03 DIAGNOSIS — Z7982 Long term (current) use of aspirin: Secondary | ICD-10-CM | POA: Insufficient documentation

## 2012-08-03 DIAGNOSIS — J449 Chronic obstructive pulmonary disease, unspecified: Secondary | ICD-10-CM | POA: Insufficient documentation

## 2012-08-03 DIAGNOSIS — I708 Atherosclerosis of other arteries: Secondary | ICD-10-CM | POA: Insufficient documentation

## 2012-08-03 LAB — POCT ACTIVATED CLOTTING TIME
Activated Clotting Time: 273 seconds
Activated Clotting Time: 211 seconds
Activated Clotting Time: 171 seconds

## 2012-08-03 LAB — POCT I-STAT, CHEM 8
BUN: 22 mg/dL (ref 6–23)
Chloride: 106 mEq/L (ref 96–112)
Sodium: 140 mEq/L (ref 135–145)

## 2012-08-03 SURGERY — ANGIOGRAM EXTREMITY BILATERAL

## 2012-08-03 MED ORDER — FENTANYL CITRATE 0.05 MG/ML IJ SOLN
INTRAMUSCULAR | Status: AC
  Filled 2012-08-03: qty 2

## 2012-08-03 MED ORDER — SODIUM CHLORIDE 0.9 % IV SOLN: INTRAVENOUS | Status: DC

## 2012-08-03 MED ORDER — METHYLPREDNISOLONE SODIUM SUCC 125 MG IJ SOLR
125.0000 mg | INTRAMUSCULAR | Status: AC
  Administered 2012-08-03: 125 mg via INTRAVENOUS
  Filled 2012-08-03: qty 2

## 2012-08-03 MED ORDER — MIDAZOLAM HCL 2 MG/2ML IJ SOLN
INTRAMUSCULAR | Status: AC
  Filled 2012-08-03: qty 2

## 2012-08-03 MED ORDER — DIPHENHYDRAMINE HCL 50 MG/ML IJ SOLN
25.0000 mg | INTRAMUSCULAR | Status: AC
  Administered 2012-08-03: 25 mg via INTRAVENOUS
  Filled 2012-08-03: qty 1

## 2012-08-03 MED ORDER — HEPARIN SODIUM (PORCINE) 1000 UNIT/ML IJ SOLN
INTRAMUSCULAR | Status: AC
  Filled 2012-08-03: qty 1

## 2012-08-03 MED ORDER — MORPHINE SULFATE 2 MG/ML IJ SOLN
2.0000 mg | INTRAMUSCULAR | Status: DC | PRN
  Administered 2012-08-03: 2 mg via INTRAVENOUS

## 2012-08-03 MED ORDER — MORPHINE SULFATE 2 MG/ML IJ SOLN
INTRAMUSCULAR | Status: AC
  Filled 2012-08-03: qty 1

## 2012-08-03 MED ORDER — LIDOCAINE HCL (PF) 1 % IJ SOLN
INTRAMUSCULAR | Status: AC
  Filled 2012-08-03: qty 30

## 2012-08-03 MED ORDER — HEPARIN (PORCINE) IN NACL 2-0.9 UNIT/ML-% IJ SOLN
INTRAMUSCULAR | Status: AC
  Filled 2012-08-03: qty 1000

## 2012-08-03 MED ORDER — FAMOTIDINE IN NACL 20-0.9 MG/50ML-% IV SOLN
20.0000 mg | INTRAVENOUS | Status: AC
  Administered 2012-08-03: 20 mg via INTRAVENOUS
  Filled 2012-08-03: qty 50

## 2012-08-03 MED ORDER — OXYCODONE-ACETAMINOPHEN 5-325 MG PO TABS
1.0000 | ORAL_TABLET | ORAL | Status: DC | PRN
  Administered 2012-08-03: 2 via ORAL
  Filled 2012-08-03: qty 2
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
## 2012-08-03 NOTE — Op Note (Addendum)
OPERATIVE NOTE   PROCEDURE: 1.  Right common femoral artery cannulation under ultrasound guidance 2.  Bilateral pelvic angiogram 3.  Second order selection 4.  Left external iliac artery angioplasty (5 mm x 40 mm) 5.  Left leg runoff 6.  Right common iliac artery angioplasty x 2 (5 mm x 40 mm, 6 mm x 20 mm) 7.  Right leg runoff  PRE-OPERATIVE DIAGNOSIS: Left > right lifestyle limiting claudication  POST-OPERATIVE DIAGNOSIS: same as above   SURGEON: Leonides Sake, MD  ANESTHESIA: conscious sedation  ESTIMATED BLOOD LOSS: 30 cc  CONTRAST: 125 cc  FINDING(S):  Aorta: distal patent  Right Left  CIA Patent stent with <50% in-stent stenosis except distally > 50%: <30% residual stenosis after angioplasty Patent stent with <50% in-stent stenosis  EIA Patent stent with <50% in-stent stenosis Patent stent with <50% in-stent stenosis except proximally >50%: <30% residual stenosis after angioplasty  IIA Occluded Occluded  CFA Patent with patent patched segment Patent but smaill  SFA Occluded Occluded  PFA Occluded proximally, reconstitutes from extensive collaterals Patent and hypertrophied  Pop Reconstituted at knee level from collaterals Patent with stenosis distally >90%  Trif Occluded TPT, inline AT takeoff Patent with proximal stenosis from distal popliteal  AT Patent, only runoff to right foot Patent, dominant runoff to right foot  Pero Occluded Faintly fills off TPT  PT Occluded Occluded   SPECIMEN(S):  none  INDICATIONS:   [name] [name] is a 67 y.o. male who presents with left > right lifestyle limiting claudication.  The patient presents for: aortogram, bilateral runoff, and possible intervention.  I discussed with the patient the nature of angiographic procedures, especially the limited patencies of any endovascular intervention.  The patient is aware of that the risks of an angiographic procedure include but are not limited to: bleeding, infection, access site complications,  renal failure, embolization, rupture of vessel, dissection, possible need for emergent surgical intervention, possible need for surgical procedures to treat the patient's pathology, and stroke and death.  The patient is aware of the risks and agrees to proceed.  DESCRIPTION: After full informed consent was obtained from the patient, the patient was brought back to the angiography suite.  The patient was placed supine upon the angiography table and connected to monitoring equipment.  The patient was then given conscious sedation, the amounts of which are documented in the patient's chart.  The patient was prepped and drape in the standard fashion for an angiographic procedure.  At this point, attention was turned to the right groin.  Under ultrasound guidance, the right common femoral artery will be cannulated with a 18 gauge needle.  The bovine patched segment appeared to be widely patent on ultrasound.  The [facility] wire was passed up into the aorta.  The needle was exchanged for a 5-Fr sheath, which was advanced over the wire into the common femoral artery.  The dilator was then removed.  The Omniflush catheter was then loaded over the wire up to just proximal to the aortic bifurcation.  The catheter was connected to the power injector circuit.  After de-airring and de-clotting the circuit, a power injector bilateral pelvic angiogram was completed.  Based on the images, I felt intervention on the in-stent stenosis >50% was going to be necessary.  The [facility] wire was replaced in the catheter, and using the [facility] and Omniflushcatheter, the left common iliac artery was selected.  The wire was advanced into the common femoral artery.  The catheter would not advance over the  stents, so I exchanged it for the endhole catheter.  Using this combination, I was able to advance into the left common femoral artery.  The wire was exchanged for a Rosen wire.  The patient's right femoral sheath was exchanged for a 6-Fr  Destination sheath, which was lodged in the left common iliac artery.  The dilator was removed.   The patient was given 5000 units of Heparin intravenously, which was a therapeutic bolus.  I selected a 5 mm x 40 mm angioplasty balloon and centered it on the >50% stenosis at the junction of the two iliac stents, likely the proximal external iliac artery.  The balloon was inflated to 10 atm for 2 minutes.  The stenosis decreased to <30% stenosis and I elected to leave the remainder alone to avoid embolization of the neointimal hyperplasia.  The sheath was aspirated.  No clots were present and the sheath was reloaded with heparinized saline.  I connected the sheath to the power injector circuit and a left leg automated runoff was completed.  Based on the images, no intervention was possible.  I pulled the sheath and wire back into the right distal external iliac artery.  I readvanced the wire into the aorta.  I selected a 5 mm x 40 mm angioplasty balloon and centered it on the >50% stenosis at the junction of the two iliac stents, likely the distal common iliac artery.  The balloon was inflated to 10 atm for 2 minutes.  The stenosis decreased to <30% stenosis, but there was some irregularity to the lumen, so I felt reangioplasty was indicated.  I exchange the balloon for a 6 mm x 20 mm.  The balloon was centered on the same lesion.  The balloon was inflated to 10 atm for 2 minutes.  The balloon inflated to profile on inflation but on completion there was some evidence of spasm, so I felt further reintervention would not be beneficial.  The luminal irregularity was still present and found to extend outside of the stent.  The sheath was aspirated.  No clots were present and the sheath was reloaded with heparinized saline.  I connected the sheath to the power injector circuit and a right leg automated runoff was completed.  Based on the images, no intervention was possible.  The sheath was aspirated.  No clots were  present and the sheath was reloaded with heparinized saline.    Based on the images, this patient will likely need bilateral femoropopliteal bypasses, which I have been reluctant to do due to continued smoking.  I suspect intervention on the left leg will be necessary to save the left leg but with continued smoking with a prosthetic bypass, no meaningful long-term patency would be expected.  The right leg will also need to be addressed but currently is less symptomatic than the left.  COMPLICATIONS: none  CONDITION: stable  Leonides Sake, MD [facility] [place] Office: [phone] Pager: [phone]  08/03/2012, 3:02 PM

## 2012-08-04 ENCOUNTER — Telehealth: Payer: Self-pay | Admitting: Vascular Surgery

## 2012-08-04 ENCOUNTER — Other Ambulatory Visit: Payer: Self-pay | Admitting: *Deleted

## 2012-08-04 DIAGNOSIS — Z01810 Encounter for preprocedural cardiovascular examination: Secondary | ICD-10-CM

## 2012-08-04 DIAGNOSIS — I739 Peripheral vascular disease, unspecified: Secondary | ICD-10-CM

## 2012-08-04 NOTE — Telephone Encounter (Signed)
Patient became upset on the phone when I tried to give him his follow up appointment information. He would not take the information, insisting that he speak with Dr. Imogene Burn first. Sent staff message to Dr. Imogene Burn to inform him that patient would like to speak with him. - kf

## 2012-08-04 NOTE — Telephone Encounter (Signed)
Message copied by Margaretmary Eddy on Tue Aug 04, 2012  4:23 PM ------      Message from: Phillips Odor      Created: Tue Aug 04, 2012  9:07 AM                   ----- Message -----         From: Fransisco Hertz, MD         Sent: 08/03/2012   3:24 PM           To: Reuel Derby, Melene Plan, RN            Zabian Swayne      [id number]      [birth year]            PROCEDURE:      1.  Right common femoral artery cannulation under ultrasound guidance      2.  Bilateral pelvic angiogram      3.  Second order selection      4.  Left external iliac artery angioplasty (5 mm x 40 mm)      5.  Left leg runoff      6.  Right common iliac artery angioplasty x 2 (5 mm x 40 mm, 6 mm x 20 mm)      7.  Right leg runoff            Follow-up: 2-3 weeks            Orders(s) for follow-up:       1.  Need cardiology preop clearance/risk stratification      2.  BLE GSV mapping       ------

## 2012-08-05 ENCOUNTER — Telehealth: Payer: Self-pay | Admitting: Vascular Surgery

## 2012-08-05 NOTE — Telephone Encounter (Signed)
Received reply from Dr. Imogene Burn. Explained to patient that Dr. Imogene Burn told him during time of procedure that he will need additional work on his legs and that he needed this work up to determine what to do next. Pt took down all appointment information including cardiac appt with Dr. Antoine Poche.

## 2012-08-05 NOTE — Telephone Encounter (Signed)
Message copied by Margaretmary Eddy on Wed Aug 05, 2012 10:08 AM ------      Message from: Phillips Odor      Created: Tue Aug 04, 2012  9:07 AM                   ----- Message -----         From: Fransisco Hertz, MD         Sent: 08/03/2012   3:24 PM           To: Reuel Derby, Melene Plan, RN            Jojuan Champney      [id number]      Jan 28, 1946            PROCEDURE:      1.  Right common femoral artery cannulation under ultrasound guidance      2.  Bilateral pelvic angiogram      3.  Second order selection      4.  Left external iliac artery angioplasty (5 mm x 40 mm)      5.  Left leg runoff      6.  Right common iliac artery angioplasty x 2 (5 mm x 40 mm, 6 mm x 20 mm)      7.  Right leg runoff            Follow-up: 2-3 weeks            Orders(s) for follow-up:       1.  Need cardiology preop clearance/risk stratification      2.  BLE GSV mapping       ------

## 2012-08-06 ENCOUNTER — Other Ambulatory Visit: Payer: Self-pay | Admitting: Cardiology

## 2012-08-06 ENCOUNTER — Ambulatory Visit
Admission: RE | Admit: 2012-08-06 | Discharge: 2012-08-06 | Disposition: A | Discharge status: Home / Self Care | Payer: PRIVATE HEALTH INSURANCE | Source: Ambulatory Visit | Attending: Cardiology | Admitting: Cardiology

## 2012-08-06 DIAGNOSIS — R0600 Dyspnea, unspecified: Secondary | ICD-10-CM

## 2012-08-06 DIAGNOSIS — R634 Abnormal weight loss: Secondary | ICD-10-CM

## 2012-08-13 ENCOUNTER — Encounter: Payer: Self-pay | Admitting: Cardiology

## 2012-08-13 ENCOUNTER — Ambulatory Visit (INDEPENDENT_AMBULATORY_CARE_PROVIDER_SITE_OTHER): Payer: PRIVATE HEALTH INSURANCE | Admitting: Cardiology

## 2012-08-13 VITALS — BP 128/81 | HR 58 | Ht 67.0 in | Wt 123.1 lb

## 2012-08-13 DIAGNOSIS — Z01810 Encounter for preprocedural cardiovascular examination: Secondary | ICD-10-CM

## 2012-08-13 DIAGNOSIS — I70219 Atherosclerosis of native arteries of extremities with intermittent claudication, unspecified extremity: Secondary | ICD-10-CM

## 2012-08-13 DIAGNOSIS — I739 Peripheral vascular disease, unspecified: Secondary | ICD-10-CM

## 2012-08-13 NOTE — Patient Instructions (Addendum)
The current medical regimen is effective;  continue present plan and medications.  Your physician has requested that you have a lexiscan myoview. For further information please visit [email or web address]. Please follow instruction sheet, as given.  Follow up will be based on these results 
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
## 2012-08-18 ENCOUNTER — Ambulatory Visit (HOSPITAL_COMMUNITY): Payer: PRIVATE HEALTH INSURANCE | Attending: Cardiology | Admitting: Radiology

## 2012-08-18 VITALS — BP 122/64 | Ht 67.0 in | Wt 123.0 lb

## 2012-08-18 DIAGNOSIS — F17200 Nicotine dependence, unspecified, uncomplicated: Secondary | ICD-10-CM | POA: Insufficient documentation

## 2012-08-18 DIAGNOSIS — R0602 Shortness of breath: Secondary | ICD-10-CM

## 2012-08-18 DIAGNOSIS — R079 Chest pain, unspecified: Secondary | ICD-10-CM | POA: Insufficient documentation

## 2012-08-18 DIAGNOSIS — R0789 Other chest pain: Secondary | ICD-10-CM

## 2012-08-18 DIAGNOSIS — R0609 Other forms of dyspnea: Secondary | ICD-10-CM | POA: Insufficient documentation

## 2012-08-18 DIAGNOSIS — I10 Essential (primary) hypertension: Secondary | ICD-10-CM | POA: Insufficient documentation

## 2012-08-18 DIAGNOSIS — Z01810 Encounter for preprocedural cardiovascular examination: Secondary | ICD-10-CM

## 2012-08-18 DIAGNOSIS — R0989 Other specified symptoms and signs involving the circulatory and respiratory systems: Secondary | ICD-10-CM | POA: Insufficient documentation

## 2012-08-18 DIAGNOSIS — I70219 Atherosclerosis of native arteries of extremities with intermittent claudication, unspecified extremity: Secondary | ICD-10-CM

## 2012-08-18 DIAGNOSIS — I739 Peripheral vascular disease, unspecified: Secondary | ICD-10-CM | POA: Insufficient documentation

## 2012-08-18 DIAGNOSIS — J438 Other emphysema: Secondary | ICD-10-CM | POA: Insufficient documentation

## 2012-08-18 MED ORDER — TECHNETIUM TC 99M SESTAMIBI GENERIC - CARDIOLITE
33.0000 | Freq: Once | INTRAVENOUS | Status: AC | PRN
  Administered 2012-08-18: 33 via INTRAVENOUS

## 2012-08-18 MED ORDER — AMINOPHYLLINE 25 MG/ML IV SOLN
75.0000 mg | Freq: Once | INTRAVENOUS | Status: AC
  Administered 2012-08-18: 75 mg via INTRAVENOUS

## 2012-08-18 MED ORDER — TECHNETIUM TC 99M SESTAMIBI GENERIC - CARDIOLITE
11.0000 | Freq: Once | INTRAVENOUS | Status: AC | PRN
  Administered 2012-08-18: 11 via INTRAVENOUS

## 2012-08-18 MED ORDER — REGADENOSON 0.4 MG/5ML IV SOLN
0.4000 mg | Freq: Once | INTRAVENOUS | Status: AC
  Administered 2012-08-18: 0.4 mg via INTRAVENOUS

## 2012-08-18 NOTE — Progress Notes (Signed)
[facility] SITE 3 NUCLEAR MED [address] [place], [place] [address] [phone]    Cardiology Nuclear Med Study  [name] [name] is a 67 y.o. male     MRN : [id number]     DOB: 1945/06/12  Procedure Date: 08/18/2012  Nuclear Med Background Indication for Stress Test:  Evaluation for Ischemia and Surgical Clearance:Possible Bilateral Fem/Pop Bypass History:  COPD, Emphysema and 2004 MPS: [facility] Closed Radiology  (-)  ischemia EF: 52% Adenosine Cardiac Risk Factors: Claudication, Hypertension, PVD and Smoker  Symptoms:  Chest Pain, DOE and SOB   Nuclear Pre-Procedure Caffeine/Decaff Intake:  None NPO After: 7:00pm   Lungs:  clear O2 Sat: 98% on room air. IV 0.9% NS with Angio Cath:  20g  IV Site: R Antecubital  IV Started by:  Cathlyn Parsons, RN  Chest Size (in):  40 Cup Size: n/a  Height: 5\' 7"  (1.702 m)  Weight:  123 lb (55.792 kg)  BMI:  Body mass index is 19.26 kg/(m^2). Tech Comments:  This patient was given Aminophylline 75 mg IV for symptoms that continued. All symptoms reversed.    Nuclear Med Study 1 or 2 day study: 1 day  Stress Test Type:  Eugenie Birks  Reading MD: Olga Millers, MD  Order Authorizing Provider:  Melany Guernsey  Resting Radionuclide: Technetium 3m Sestamibi  Resting Radionuclide Dose: 11.0 mCi   Stress Radionuclide:  Technetium 61m Sestamibi  Stress Radionuclide Dose: 33.0 mCi           Stress Protocol Rest HR: 48 Stress HR: 80  Rest BP: 122/64 Stress BP: 126/80  Exercise Time (min): n/a METS: n/a   Predicted Max HR: 156 bpm % Max HR: 51.28 bpm Rate Pressure Product: 21308   Dose of Adenosine (mg):  n/a Dose of Lexiscan: 0.4 mg  Dose of Atropine (mg): n/a Dose of Dobutamine: n/a mcg/kg/min (at max HR)  Stress Test Technologist: Milana Na, EMT-P  Nuclear Technologist:  Domenic Polite, CNMT     Rest Procedure:  Myocardial perfusion imaging was performed at rest 45 minutes following the intravenous administration of  Technetium 15m Sestamibi. Rest ECG: Marked sinus bradycardia, IVCD.  Stress Procedure:  The patient received IV Lexiscan 0.4 mg over 15-seconds.  Technetium 45m Sestamibi injected at 30-seconds. Quantitative spect images were obtained after a 45 minute delay. Stress ECG: No significant ST segment change suggestive of ischemia.  QPS Raw Data Images:  Acquisition technically good; normal left ventricular size. Stress Images:  There is decreased uptake in the apex. Rest Images:  There is decreased uptake in the apex. Subtraction (SDS):  No evidence of ischemia. Transient Ischemic Dilatation (Normal <1.22):  NA Lung/Heart Ratio (Normal <0.45):  0.35  Quantitative Gated Spect Images QGS EDV:  100 ml QGS ESV:  44 ml  Impression Exercise Capacity:  Lexiscan with no exercise. BP Response:  Normal blood pressure response. Clinical Symptoms:  There is dyspnea and chest pain. ECG Impression:  No significant ST segment change suggestive of ischemia. Comparison with Prior Nuclear Study: No images to compare  Overall Impression:  Low risk stress nuclear study with a small, mild intensity, fixed apical defect consistent with thinning; no ischemia.  LV Ejection Fraction: 56%.  LV Wall Motion:  NL LV Function; NL Wall Motion  Olga Millers

## 2012-08-20 ENCOUNTER — Encounter: Payer: Self-pay | Admitting: Vascular Surgery

## 2012-08-21 ENCOUNTER — Encounter: Payer: Self-pay | Admitting: Vascular Surgery

## 2012-08-21 ENCOUNTER — Encounter (INDEPENDENT_AMBULATORY_CARE_PROVIDER_SITE_OTHER): Payer: PRIVATE HEALTH INSURANCE | Admitting: *Deleted

## 2012-08-21 ENCOUNTER — Ambulatory Visit (INDEPENDENT_AMBULATORY_CARE_PROVIDER_SITE_OTHER): Payer: PRIVATE HEALTH INSURANCE | Admitting: Vascular Surgery

## 2012-08-21 ENCOUNTER — Other Ambulatory Visit: Payer: Self-pay

## 2012-08-21 VITALS — BP 150/76 | HR 51 | Resp 16 | Ht 67.0 in | Wt 125.0 lb

## 2012-08-21 DIAGNOSIS — Z01810 Encounter for preprocedural cardiovascular examination: Secondary | ICD-10-CM

## 2012-08-21 DIAGNOSIS — I739 Peripheral vascular disease, unspecified: Secondary | ICD-10-CM | POA: Insufficient documentation

## 2012-08-21 DIAGNOSIS — I70219 Atherosclerosis of native arteries of extremities with intermittent claudication, unspecified extremity: Secondary | ICD-10-CM
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
## 2012-08-26 ENCOUNTER — Ambulatory Visit: Payer: PRIVATE HEALTH INSURANCE | Admitting: Cardiovascular Disease

## 2012-08-27 ENCOUNTER — Encounter (HOSPITAL_COMMUNITY): Payer: Self-pay | Admitting: Pharmacy Technician

## 2012-08-27 ENCOUNTER — Encounter: Payer: Self-pay | Admitting: Cardiovascular Disease

## 2012-09-01 NOTE — Pre-Procedure Instructions (Signed)
Nikolaj Geraghty  09/01/2012   Your procedure is scheduled on:  Wednesday, August 13th   Report to [facility] at 6:30 AM.             (Come in through Entrance "A", register at desk)  Call this number if you have problems the morning of surgery: [phone]   Remember:   Do not eat food or drink liquids after midnight Tuesday.   Take these medicines the morning of surgery with A SIP OF WATER: Xalatan eye drops, Hydrocodone, Norvasc   Do not wear jewelry  Do not wear lotions, powders, or colognes. You may NOT wear deodorant.             Men may shave face and neck.   Do not bring valuables to the hospital.  [facility] is not responsible for any belongings or valuables.  Contacts, dentures or bridgework may not be worn into surgery.   Leave suitcase in the car. After surgery it may be brought to your room.  For patients admitted to the hospital, checkout time is 11:00 AM the day of discharge.   Name and phone number of your driver:    Special Instructions: Shower using CHG 2 nights before surgery and the night before surgery.  If you shower the day of surgery use CHG.  Use special wash - you have one bottle of CHG for all showers.  You should use approximately 1/3 of the bottle for each shower.   Please read over the following fact sheets that you were given: Pain Booklet, Coughing and Deep Breathing, Blood Transfusion Information, MRSA Information and Surgical Site Infection Prevention

## 2012-09-02 ENCOUNTER — Encounter (HOSPITAL_COMMUNITY)
Admission: RE | Admit: 2012-09-02 | Discharge: 2012-09-02 | Disposition: A | Discharge status: Home / Self Care | Payer: PRIVATE HEALTH INSURANCE | Source: Ambulatory Visit | Attending: Vascular Surgery | Admitting: Vascular Surgery

## 2012-09-02 ENCOUNTER — Encounter (HOSPITAL_COMMUNITY): Payer: Self-pay

## 2012-09-02 DIAGNOSIS — Z01812 Encounter for preprocedural laboratory examination: Secondary | ICD-10-CM | POA: Insufficient documentation

## 2012-09-02 DIAGNOSIS — Z01818 Encounter for other preprocedural examination: Secondary | ICD-10-CM | POA: Insufficient documentation

## 2012-09-02 HISTORY — DX: Gastro-esophageal reflux disease without esophagitis: K21.9

## 2012-09-02 HISTORY — DX: Anemia, unspecified: D64.9

## 2012-09-02 HISTORY — DX: Other specified postprocedural states: Z98.890

## 2012-09-02 HISTORY — DX: Nausea with vomiting, unspecified: R11.2

## 2012-09-02 HISTORY — DX: Carpal tunnel syndrome, bilateral upper limbs: G56.03

## 2012-09-02 LAB — URINE MICROSCOPIC-ADD ON

## 2012-09-02 LAB — URINALYSIS, ROUTINE W REFLEX MICROSCOPIC
Glucose, UA: NEGATIVE mg/dL
Leukocytes, UA: NEGATIVE
Protein, ur: 100 mg/dL — AB
pH: 6 (ref 5.0–8.0)

## 2012-09-02 LAB — COMPREHENSIVE METABOLIC PANEL
ALT: 8 U/L (ref 0–53)
AST: 17 U/L (ref 0–37)
Calcium: 10.6 mg/dL — ABNORMAL HIGH (ref 8.4–10.5)
GFR calc Af Amer: 69 mL/min — ABNORMAL LOW (ref >90)
Sodium: 139 mEq/L (ref 135–145)
Total Protein: 8.4 g/dL — ABNORMAL HIGH (ref 6.0–8.3)

## 2012-09-02 LAB — CBC
MCH: 31.1 pg (ref 26.0–34.0)
MCHC: 36.3 g/dL — ABNORMAL HIGH (ref 30.0–36.0)
Platelets: 170 10*3/uL (ref 150–400)

## 2012-09-02 LAB — SURGICAL PCR SCREEN: MRSA, PCR: NEGATIVE

## 2012-09-02 NOTE — Progress Notes (Addendum)
Pt refuses to wear blue band for 1 week....i have informed patient that he will need blood sample drawn DOS.

## 2012-09-08 ENCOUNTER — Encounter (HOSPITAL_COMMUNITY): Payer: Self-pay | Admitting: Critical Care Medicine

## 2012-09-08 MED ORDER — DEXTROSE 5 % IV SOLN
1.5000 g | INTRAVENOUS | Status: AC
  Administered 2012-09-09: 1.5 g via INTRAVENOUS
  Filled 2012-09-08: qty 1.5

## 2012-09-09 ENCOUNTER — Encounter (HOSPITAL_COMMUNITY): Payer: Self-pay | Admitting: Critical Care Medicine

## 2012-09-09 ENCOUNTER — Inpatient Hospital Stay (HOSPITAL_COMMUNITY): Payer: PRIVATE HEALTH INSURANCE | Admitting: Critical Care Medicine

## 2012-09-09 ENCOUNTER — Encounter (HOSPITAL_COMMUNITY): Payer: Self-pay

## 2012-09-09 ENCOUNTER — Inpatient Hospital Stay (HOSPITAL_COMMUNITY)
Admission: RE | Admit: 2012-09-09 | Discharge: 2012-09-14 | DRG: 238 | Disposition: A | Discharge status: Home / Self Care | Payer: PRIVATE HEALTH INSURANCE | Source: Ambulatory Visit | Attending: Vascular Surgery | Admitting: Vascular Surgery

## 2012-09-09 ENCOUNTER — Encounter (HOSPITAL_COMMUNITY)
Admission: RE | Disposition: A | Discharge status: Home / Self Care | Payer: Self-pay | Source: Ambulatory Visit | Attending: Vascular Surgery

## 2012-09-09 DIAGNOSIS — Z79899 Other long term (current) drug therapy: Secondary | ICD-10-CM

## 2012-09-09 DIAGNOSIS — I739 Peripheral vascular disease, unspecified: Secondary | ICD-10-CM

## 2012-09-09 DIAGNOSIS — I10 Essential (primary) hypertension: Secondary | ICD-10-CM | POA: Diagnosis present

## 2012-09-09 DIAGNOSIS — H409 Unspecified glaucoma: Secondary | ICD-10-CM | POA: Diagnosis present

## 2012-09-09 DIAGNOSIS — Z7982 Long term (current) use of aspirin: Secondary | ICD-10-CM

## 2012-09-09 DIAGNOSIS — IMO0002 Reserved for concepts with insufficient information to code with codable children: Secondary | ICD-10-CM

## 2012-09-09 DIAGNOSIS — J4489 Other specified chronic obstructive pulmonary disease: Secondary | ICD-10-CM | POA: Diagnosis present

## 2012-09-09 DIAGNOSIS — J449 Chronic obstructive pulmonary disease, unspecified: Secondary | ICD-10-CM | POA: Diagnosis present

## 2012-09-09 DIAGNOSIS — D649 Anemia, unspecified: Secondary | ICD-10-CM | POA: Diagnosis present

## 2012-09-09 DIAGNOSIS — K219 Gastro-esophageal reflux disease without esophagitis: Secondary | ICD-10-CM | POA: Diagnosis present

## 2012-09-09 DIAGNOSIS — F17200 Nicotine dependence, unspecified, uncomplicated: Secondary | ICD-10-CM | POA: Diagnosis present

## 2012-09-09 DIAGNOSIS — Z981 Arthrodesis status: Secondary | ICD-10-CM

## 2012-09-09 DIAGNOSIS — K279 Peptic ulcer, site unspecified, unspecified as acute or chronic, without hemorrhage or perforation: Secondary | ICD-10-CM | POA: Diagnosis present

## 2012-09-09 DIAGNOSIS — I70219 Atherosclerosis of native arteries of extremities with intermittent claudication, unspecified extremity: Principal | ICD-10-CM | POA: Diagnosis present

## 2012-09-09 DIAGNOSIS — I743 Embolism and thrombosis of arteries of the lower extremities: Secondary | ICD-10-CM

## 2012-09-09 HISTORY — PX: FEMORAL-POPLITEAL BYPASS GRAFT: SHX937

## 2012-09-09 HISTORY — PX: PATCH ANGIOPLASTY: SHX6230

## 2012-09-09 HISTORY — PX: ENDARTERECTOMY FEMORAL: SHX5804

## 2012-09-09 LAB — TYPE AND SCREEN

## 2012-09-09 LAB — CBC
MCH: 29.6 pg (ref 26.0–34.0)
MCHC: 34.8 g/dL (ref 30.0–36.0)
MCV: 85.1 fL (ref 78.0–100.0)
Platelets: 152 10*3/uL (ref 150–400)
RBC: 3.82 MIL/uL — ABNORMAL LOW (ref 4.22–5.81)

## 2012-09-09 SURGERY — BYPASS GRAFT FEMORAL-POPLITEAL ARTERY
Anesthesia: General | Laterality: Left

## 2012-09-09 MED ORDER — NEOSTIGMINE METHYLSULFATE 1 MG/ML IJ SOLN
INTRAMUSCULAR | Status: DC | PRN
  Administered 2012-09-09: 1 mg via INTRAVENOUS

## 2012-09-09 MED ORDER — ARTIFICIAL TEARS OP OINT
TOPICAL_OINTMENT | OPHTHALMIC | Status: DC | PRN
  Administered 2012-09-09: 1 via OPHTHALMIC

## 2012-09-09 MED ORDER — HYDROMORPHONE HCL PF 1 MG/ML IJ SOLN
INTRAMUSCULAR | Status: AC
  Filled 2012-09-09: qty 1

## 2012-09-09 MED ORDER — DIPHENHYDRAMINE HCL 50 MG/ML IJ SOLN
INTRAMUSCULAR | Status: AC
  Filled 2012-09-09: qty 1

## 2012-09-09 MED ORDER — DIPHENHYDRAMINE HCL 50 MG/ML IJ SOLN
25.0000 mg | Freq: Four times a day (QID) | INTRAMUSCULAR | Status: DC | PRN
  Administered 2012-09-09 – 2012-09-13 (×2): 25 mg via INTRAVENOUS
  Filled 2012-09-09 (×2): qty 1

## 2012-09-09 MED ORDER — AMLODIPINE BESYLATE 10 MG PO TABS
10.0000 mg | ORAL_TABLET | Freq: Every day | ORAL | Status: DC
  Administered 2012-09-10 – 2012-09-13 (×4): 10 mg via ORAL
  Filled 2012-09-09 (×5): qty 1

## 2012-09-09 MED ORDER — ONDANSETRON HCL 4 MG/2ML IJ SOLN
INTRAMUSCULAR | Status: DC | PRN
  Administered 2012-09-09: 4 mg via INTRAVENOUS

## 2012-09-09 MED ORDER — FENTANYL CITRATE 0.05 MG/ML IJ SOLN
INTRAMUSCULAR | Status: DC | PRN
  Administered 2012-09-09: 50 ug via INTRAVENOUS
  Administered 2012-09-09: 25 ug via INTRAVENOUS
  Administered 2012-09-09: 50 ug via INTRAVENOUS
  Administered 2012-09-09: 25 ug via INTRAVENOUS
  Administered 2012-09-09: 100 ug via INTRAVENOUS

## 2012-09-09 MED ORDER — PHENOL 1.4 % MT LIQD: 1.0000 | OROMUCOSAL | Status: DC | PRN

## 2012-09-09 MED ORDER — ALBUTEROL SULFATE HFA 108 (90 BASE) MCG/ACT IN AERS
2.0000 | INHALATION_SPRAY | RESPIRATORY_TRACT | Status: DC | PRN
  Filled 2012-09-09: qty 6.7

## 2012-09-09 MED ORDER — MIDAZOLAM HCL 5 MG/5ML IJ SOLN
INTRAMUSCULAR | Status: DC | PRN
  Administered 2012-09-09: 2 mg via INTRAVENOUS

## 2012-09-09 MED ORDER — POTASSIUM CHLORIDE IN NACL 20-0.9 MEQ/L-% IV SOLN
INTRAVENOUS | Status: DC
  Administered 2012-09-09: 100 mL/h via INTRAVENOUS
  Filled 2012-09-09 (×7): qty 1000

## 2012-09-09 MED ORDER — AMITRIPTYLINE HCL 50 MG PO TABS
50.0000 mg | ORAL_TABLET | Freq: Every day | ORAL | Status: DC
  Administered 2012-09-09 – 2012-09-13 (×5): 50 mg via ORAL
  Filled 2012-09-09 (×6): qty 1

## 2012-09-09 MED ORDER — LATANOPROST 0.005 % OP SOLN
1.0000 [drp] | Freq: Every day | OPHTHALMIC | Status: DC
  Administered 2012-09-09 – 2012-09-13 (×5): 1 [drp] via OPHTHALMIC
  Filled 2012-09-09: qty 2.5

## 2012-09-09 MED ORDER — NICOTINE POLACRILEX 2 MG MT GUM
4.0000 mg | CHEWING_GUM | Freq: Four times a day (QID) | OROMUCOSAL | Status: DC | PRN
  Filled 2012-09-09: qty 2

## 2012-09-09 MED ORDER — LIDOCAINE HCL (CARDIAC) 20 MG/ML IV SOLN
INTRAVENOUS | Status: DC | PRN
  Administered 2012-09-09: 100 mg via INTRAVENOUS

## 2012-09-09 MED ORDER — LACTATED RINGERS IV SOLN
INTRAVENOUS | Status: DC | PRN
  Administered 2012-09-09 (×3): via INTRAVENOUS

## 2012-09-09 MED ORDER — SODIUM CHLORIDE 0.9 % IV SOLN: 500.0000 mL | Freq: Once | INTRAVENOUS | Status: AC | PRN

## 2012-09-09 MED ORDER — LABETALOL HCL 5 MG/ML IV SOLN
10.0000 mg | INTRAVENOUS | Status: DC | PRN
  Filled 2012-09-09: qty 4

## 2012-09-09 MED ORDER — ONDANSETRON HCL 4 MG/2ML IJ SOLN
4.0000 mg | Freq: Four times a day (QID) | INTRAMUSCULAR | Status: DC | PRN
  Administered 2012-09-10: 4 mg via INTRAVENOUS
  Filled 2012-09-09: qty 2

## 2012-09-09 MED ORDER — NITROGLYCERIN 0.4 MG SL SUBL: 0.4000 mg | SUBLINGUAL_TABLET | SUBLINGUAL | Status: DC | PRN

## 2012-09-09 MED ORDER — ACETAMINOPHEN 650 MG RE SUPP: 325.0000 mg | RECTAL | Status: DC | PRN

## 2012-09-09 MED ORDER — HYDROCODONE-ACETAMINOPHEN 7.5-325 MG PO TABS
1.0000 | ORAL_TABLET | ORAL | Status: DC | PRN
  Administered 2012-09-10: 1 via ORAL
  Filled 2012-09-09: qty 1

## 2012-09-09 MED ORDER — DEXAMETHASONE SODIUM PHOSPHATE 4 MG/ML IJ SOLN
INTRAMUSCULAR | Status: DC | PRN
  Administered 2012-09-09: 4 mg via INTRAVENOUS

## 2012-09-09 MED ORDER — HYDRALAZINE HCL 20 MG/ML IJ SOLN: 10.0000 mg | INTRAMUSCULAR | Status: DC | PRN

## 2012-09-09 MED ORDER — SODIUM CHLORIDE 0.9 % IV SOLN: INTRAVENOUS | Status: DC

## 2012-09-09 MED ORDER — THROMBIN 20000 UNITS EX SOLR
CUTANEOUS | Status: DC | PRN
  Administered 2012-09-09 (×2): via TOPICAL

## 2012-09-09 MED ORDER — HYDROXYZINE HCL 25 MG PO TABS
25.0000 mg | ORAL_TABLET | Freq: Once | ORAL | Status: AC
  Administered 2012-09-09: 25 mg via ORAL
  Filled 2012-09-09: qty 1

## 2012-09-09 MED ORDER — DIPHENHYDRAMINE HCL 50 MG/ML IJ SOLN
12.5000 mg | Freq: Once | INTRAMUSCULAR | Status: AC
  Administered 2012-09-09 (×2): 12.5 mg via INTRAVENOUS

## 2012-09-09 MED ORDER — SODIUM CHLORIDE 0.9 % IR SOLN
Status: DC | PRN
  Administered 2012-09-09: 09:00:00

## 2012-09-09 MED ORDER — DEXTROSE 5 % IV SOLN
20.0000 mg | INTRAVENOUS | Status: DC | PRN
  Administered 2012-09-09: 13:00:00 via INTRAVENOUS
  Administered 2012-09-09: 25 ug/min via INTRAVENOUS

## 2012-09-09 MED ORDER — COLCHICINE 0.6 MG PO TABS
0.6000 mg | ORAL_TABLET | Freq: Every day | ORAL | Status: DC
  Administered 2012-09-10 – 2012-09-13 (×4): 0.6 mg via ORAL
  Filled 2012-09-09 (×5): qty 1

## 2012-09-09 MED ORDER — PROPOFOL 10 MG/ML IV BOLUS
INTRAVENOUS | Status: DC | PRN
  Administered 2012-09-09: 140 mg via INTRAVENOUS

## 2012-09-09 MED ORDER — CLOPIDOGREL BISULFATE 300 MG PO TABS
300.0000 mg | ORAL_TABLET | Freq: Once | ORAL | Status: AC
  Administered 2012-09-09: 300 mg via ORAL
  Filled 2012-09-09: qty 1

## 2012-09-09 MED ORDER — METOPROLOL TARTRATE 1 MG/ML IV SOLN: 2.0000 mg | INTRAVENOUS | Status: DC | PRN

## 2012-09-09 MED ORDER — DEXTROSE 5 % IV SOLN
1.5000 g | Freq: Two times a day (BID) | INTRAVENOUS | Status: AC
  Administered 2012-09-09 – 2012-09-10 (×2): 1.5 g via INTRAVENOUS
  Filled 2012-09-09 (×2): qty 1.5

## 2012-09-09 MED ORDER — 0.9 % SODIUM CHLORIDE (POUR BTL) OPTIME
TOPICAL | Status: DC | PRN
  Administered 2012-09-09: 2000 mL

## 2012-09-09 MED ORDER — HYDROCHLOROTHIAZIDE 12.5 MG PO CAPS
12.5000 mg | ORAL_CAPSULE | Freq: Every day | ORAL | Status: DC
  Administered 2012-09-10 – 2012-09-13 (×4): 12.5 mg via ORAL
  Filled 2012-09-09 (×5): qty 1

## 2012-09-09 MED ORDER — GUAIFENESIN-DM 100-10 MG/5ML PO SYRP: 15.0000 mL | ORAL_SOLUTION | ORAL | Status: DC | PRN

## 2012-09-09 MED ORDER — DOCUSATE SODIUM 100 MG PO CAPS
100.0000 mg | ORAL_CAPSULE | Freq: Every day | ORAL | Status: DC
  Administered 2012-09-11 – 2012-09-13 (×2): 100 mg via ORAL
  Filled 2012-09-09 (×5): qty 1

## 2012-09-09 MED ORDER — ASPIRIN EC 81 MG PO TBEC
81.0000 mg | DELAYED_RELEASE_TABLET | Freq: Every day | ORAL | Status: DC
  Administered 2012-09-11 – 2012-09-13 (×2): 81 mg via ORAL
  Filled 2012-09-09 (×5): qty 1

## 2012-09-09 MED ORDER — ROCURONIUM BROMIDE 100 MG/10ML IV SOLN
INTRAVENOUS | Status: DC | PRN
  Administered 2012-09-09: 50 mg via INTRAVENOUS

## 2012-09-09 MED ORDER — PANTOPRAZOLE SODIUM 40 MG PO TBEC: 40.0000 mg | DELAYED_RELEASE_TABLET | Freq: Every day | ORAL | Status: DC

## 2012-09-09 MED ORDER — GLYCOPYRROLATE 0.2 MG/ML IJ SOLN
INTRAMUSCULAR | Status: DC | PRN
  Administered 2012-09-09 (×2): 0.1 mg via INTRAVENOUS
  Administered 2012-09-09: 0.2 mg via INTRAVENOUS

## 2012-09-09 MED ORDER — OXYCODONE HCL 5 MG PO TABS: 5.0000 mg | ORAL_TABLET | Freq: Once | ORAL | Status: DC | PRN

## 2012-09-09 MED ORDER — POTASSIUM CHLORIDE CRYS ER 20 MEQ PO TBCR: 20.0000 meq | EXTENDED_RELEASE_TABLET | Freq: Every day | ORAL | Status: DC | PRN

## 2012-09-09 MED ORDER — CLOPIDOGREL BISULFATE 75 MG PO TABS
75.0000 mg | ORAL_TABLET | Freq: Every day | ORAL | Status: DC
  Administered 2012-09-10 – 2012-09-14 (×5): 75 mg via ORAL
  Filled 2012-09-09 (×7): qty 1

## 2012-09-09 MED ORDER — THROMBIN 20000 UNITS EX SOLR
CUTANEOUS | Status: AC
  Filled 2012-09-09: qty 40000

## 2012-09-09 MED ORDER — HYDROMORPHONE HCL PF 1 MG/ML IJ SOLN
0.5000 mg | INTRAMUSCULAR | Status: DC | PRN
  Administered 2012-09-09 (×2): 0.5 mg via INTRAVENOUS
  Administered 2012-09-10: 1 mg via INTRAVENOUS
  Filled 2012-09-09 (×4): qty 1

## 2012-09-09 MED ORDER — HEPARIN (PORCINE) IN NACL 100-0.45 UNIT/ML-% IJ SOLN
500.0000 [IU]/h | INTRAMUSCULAR | Status: DC
  Administered 2012-09-09: 500 [IU]/h via INTRAVENOUS
  Filled 2012-09-09: qty 250

## 2012-09-09 MED ORDER — OXYCODONE HCL 5 MG/5ML PO SOLN: 5.0000 mg | Freq: Once | ORAL | Status: DC | PRN

## 2012-09-09 MED ORDER — PROTAMINE SULFATE 10 MG/ML IV SOLN
INTRAVENOUS | Status: DC | PRN
  Administered 2012-09-09 (×3): 10 mg via INTRAVENOUS

## 2012-09-09 MED ORDER — HYDROMORPHONE HCL PF 1 MG/ML IJ SOLN
0.2500 mg | INTRAMUSCULAR | Status: DC | PRN
  Administered 2012-09-09 (×4): 0.5 mg via INTRAVENOUS

## 2012-09-09 MED ORDER — PHENYLEPHRINE HCL 10 MG/ML IJ SOLN
INTRAMUSCULAR | Status: DC | PRN
  Administered 2012-09-09 (×2): 80 ug via INTRAVENOUS
  Administered 2012-09-09: 120 ug via INTRAVENOUS
  Administered 2012-09-09 (×2): 80 ug via INTRAVENOUS

## 2012-09-09 MED ORDER — HEPARIN SODIUM (PORCINE) 1000 UNIT/ML IJ SOLN
INTRAMUSCULAR | Status: DC | PRN
  Administered 2012-09-09: 5000 [IU] via INTRAVENOUS
  Administered 2012-09-09 (×3): 2000 [IU] via INTRAVENOUS

## 2012-09-09 MED ORDER — ACETAMINOPHEN 325 MG PO TABS
325.0000 mg | ORAL_TABLET | ORAL | Status: DC | PRN
  Administered 2012-09-11: 650 mg via ORAL
  Filled 2012-09-09: qty 2

## 2012-09-09 SURGICAL SUPPLY — 69 items
BANDAGE ELASTIC 4 VELCRO ST LF (GAUZE/BANDAGES/DRESSINGS) IMPLANT
BANDAGE ESMARK 6X9 LF (GAUZE/BANDAGES/DRESSINGS) IMPLANT
BNDG ESMARK 6X9 LF (GAUZE/BANDAGES/DRESSINGS)
CANISTER SUCTION 2500CC (MISCELLANEOUS) ×2 IMPLANT
CLIP TI MEDIUM 24 (CLIP) ×2 IMPLANT
CLIP TI WIDE RED SMALL 24 (CLIP) ×2 IMPLANT
CLOTH BEACON ORANGE TIMEOUT ST (SAFETY) ×2 IMPLANT
COVER PROBE W GEL 5X96 (DRAPES) ×2 IMPLANT
COVER SURGICAL LIGHT HANDLE (MISCELLANEOUS) ×2 IMPLANT
DERMABOND ADVANCED (GAUZE/BANDAGES/DRESSINGS) ×2
DERMABOND ADVANCED .7 DNX12 (GAUZE/BANDAGES/DRESSINGS) ×2 IMPLANT
DRAIN CHANNEL 15F RND FF W/TCR (WOUND CARE) IMPLANT
DRAPE C-ARM 42X72 X-RAY (DRAPES) ×2 IMPLANT
DRAPE INCISE 23X17 IOBAN STRL (DRAPES) ×1
DRAPE INCISE IOBAN 23X17 STRL (DRAPES) ×1 IMPLANT
DRAPE WARM FLUID 44X44 (DRAPE) ×2 IMPLANT
DRSG COVADERM 4X10 (GAUZE/BANDAGES/DRESSINGS) IMPLANT
DRSG COVADERM 4X8 (GAUZE/BANDAGES/DRESSINGS) IMPLANT
ELECT REM PT RETURN 9FT ADLT (ELECTROSURGICAL) ×2
ELECTRODE REM PT RTRN 9FT ADLT (ELECTROSURGICAL) ×1 IMPLANT
EVACUATOR SILICONE 100CC (DRAIN) IMPLANT
GAUZE SPONGE 4X4 16PLY XRAY LF (GAUZE/BANDAGES/DRESSINGS) ×2 IMPLANT
GLOVE BIO SURGEON STRL SZ 6 (GLOVE) ×2 IMPLANT
GLOVE BIO SURGEON STRL SZ7 (GLOVE) ×4 IMPLANT
GLOVE BIOGEL PI IND STRL 6.5 (GLOVE) ×2 IMPLANT
GLOVE BIOGEL PI IND STRL 7.0 (GLOVE) ×2 IMPLANT
GLOVE BIOGEL PI IND STRL 7.5 (GLOVE) ×3 IMPLANT
GLOVE BIOGEL PI INDICATOR 6.5 (GLOVE) ×2
GLOVE BIOGEL PI INDICATOR 7.0 (GLOVE) ×2
GLOVE BIOGEL PI INDICATOR 7.5 (GLOVE) ×3
GLOVE ECLIPSE 6.5 STRL STRAW (GLOVE) ×4 IMPLANT
GLOVE SS BIOGEL STRL SZ 6.5 (GLOVE) ×1 IMPLANT
GLOVE SUPERSENSE BIOGEL SZ 6.5 (GLOVE) ×1
GOWN STRL NON-REIN LRG LVL3 (GOWN DISPOSABLE) ×14 IMPLANT
GRAFT PROPATEN W/RING 6X80X60 (Vascular Products) ×2 IMPLANT
INSERT FOGARTY SM (MISCELLANEOUS) IMPLANT
KIT BASIN OR (CUSTOM PROCEDURE TRAY) ×2 IMPLANT
KIT ROOM TURNOVER OR (KITS) ×2 IMPLANT
LOOP VESSEL MINI RED (MISCELLANEOUS) ×4 IMPLANT
MARKER GRAFT CORONARY BYPASS (MISCELLANEOUS) IMPLANT
NS IRRIG 1000ML POUR BTL (IV SOLUTION) ×4 IMPLANT
PACK PERIPHERAL VASCULAR (CUSTOM PROCEDURE TRAY) ×2 IMPLANT
PAD ARMBOARD 7.5X6 YLW CONV (MISCELLANEOUS) ×4 IMPLANT
PATCH VASCULAR VASCU GUARD 1X6 (Vascular Products) ×4 IMPLANT
SET MICROPUNCTURE 5F STIFF (MISCELLANEOUS) IMPLANT
SPONGE INTESTINAL PEANUT (DISPOSABLE) ×2 IMPLANT
SPONGE SURGIFOAM ABS GEL 100 (HEMOSTASIS) ×4 IMPLANT
STAPLER VISISTAT 35W (STAPLE) IMPLANT
STOPCOCK 4 WAY LG BORE MALE ST (IV SETS) IMPLANT
SUT ETHILON 3 0 PS 1 (SUTURE) IMPLANT
SUT GORETEX 5 0 TT13 24 (SUTURE) ×2 IMPLANT
SUT GORETEX 6.0 TT13 (SUTURE) ×4 IMPLANT
SUT MNCRL AB 4-0 PS2 18 (SUTURE) ×4 IMPLANT
SUT PROLENE 5 0 C 1 24 (SUTURE) ×2 IMPLANT
SUT PROLENE 6 0 BV (SUTURE) ×12 IMPLANT
SUT PROLENE 7 0 BV 1 (SUTURE) IMPLANT
SUT SILK 2 0 FS (SUTURE) ×2 IMPLANT
SUT SILK 3 0 (SUTURE)
SUT SILK 3-0 18XBRD TIE 12 (SUTURE) IMPLANT
SUT VIC AB 2-0 CT1 27 (SUTURE) ×1
SUT VIC AB 2-0 CT1 TAPERPNT 27 (SUTURE) ×1 IMPLANT
SUT VIC AB 3-0 SH 27 (SUTURE) ×2
SUT VIC AB 3-0 SH 27X BRD (SUTURE) ×2 IMPLANT
TOWEL OR 17X24 6PK STRL BLUE (TOWEL DISPOSABLE) ×4 IMPLANT
TOWEL OR 17X26 10 PK STRL BLUE (TOWEL DISPOSABLE) ×4 IMPLANT
TRAY FOLEY CATH 16FRSI W/METER (SET/KITS/TRAYS/PACK) ×2 IMPLANT
TUBING EXTENTION W/L.L. (IV SETS) ×2 IMPLANT
UNDERPAD 30X30 INCONTINENT (UNDERPADS AND DIAPERS) ×2 IMPLANT
WATER STERILE IRR 1000ML POUR (IV SOLUTION) ×2 IMPLANT
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
## 2012-09-09 NOTE — Interval H&P Note (Signed)
[facility]  History and Physical Update  The patient was interviewed and re-examined.  The patient's previous History and Physical has been reviewed and is unchanged.  There is no change in the plan of care: Left fem-pop BPG w/ propaten, L pop EA, possible L iliofem EA.  Leonides Sake, MD [facility] [place] Office: [phone] Pager: [phone]  09/09/2012, 8:09 AM

## 2012-09-09 NOTE — Preoperative (Signed)
Beta Blockers   Reason not to administer Beta Blockers:Not Applicable 

## 2012-09-09 NOTE — Anesthesia Preprocedure Evaluation (Addendum)
Anesthesia Evaluation  Patient identified by MRN, date of birth, ID band Patient awake    Reviewed: Allergy & Precautions, H&P , NPO status , Patient's Chart, lab work & pertinent test results  History of Anesthesia Complications (+) PONV  Airway Mallampati: II TM Distance: >3 FB Neck ROM: full    Dental  (+) Dental Advisory Given, Edentulous Upper and Edentulous Lower   Pulmonary COPD COPD inhaler, Current Smoker,  breath sounds clear to auscultation  Pulmonary exam normal       Cardiovascular hypertension, Pt. on medications + Peripheral Vascular Disease Rhythm:regular Rate:Normal     Neuro/Psych negative neurological ROS  negative psych ROS   GI/Hepatic negative GI ROS, Neg liver ROS, GERD-  ,  Endo/Other  negative endocrine ROS  Renal/GU negative Renal ROS  negative genitourinary   Musculoskeletal   Abdominal   Peds  Hematology negative hematology ROS (+)   Anesthesia Other Findings   Reproductive/Obstetrics negative OB ROS                          Anesthesia Physical Anesthesia Plan  ASA: III  Anesthesia Plan: General   Post-op Pain Management:    Induction: Intravenous  Airway Management Planned: Oral ETT  Additional Equipment:   Intra-op Plan:   Post-operative Plan: Extubation in OR  Informed Consent: I have reviewed the patients History and Physical, chart, labs and discussed the procedure including the risks, benefits and alternatives for the proposed anesthesia with the patient or authorized representative who has indicated his/her understanding and acceptance.   Dental advisory given  Plan Discussed with: CRNA  Anesthesia Plan Comments:         Anesthesia Quick Evaluation

## 2012-09-09 NOTE — Anesthesia Procedure Notes (Signed)
Procedure Name: Intubation Date/Time: 09/09/2012 8:32 AM Performed by: Elon Alas Pre-anesthesia Checklist: Patient identified, Timeout performed, Emergency Drugs available, Suction available and Patient being monitored Patient Re-evaluated:Patient Re-evaluated prior to inductionOxygen Delivery Method: Circle system utilized Preoxygenation: Pre-oxygenation with 100% oxygen Intubation Type: IV induction Ventilation: Mask ventilation without difficulty Laryngoscope Size: Mac and 4 Grade View: Grade I Tube type: Oral Tube size: 7.5 mm Number of attempts: 1 Airway Equipment and Method: Stylet and LTA kit utilized Placement Confirmation: positive ETCO2,  ETT inserted through vocal cords under direct vision and breath sounds checked- equal and bilateral Secured at: 23 cm Tube secured with: Tape Dental Injury: Teeth and Oropharynx as per pre-operative assessment

## 2012-09-09 NOTE — Progress Notes (Signed)
Pt informed that hospital not responsible for valuables. He states he has wallet with $3.00 and a credit card, but declines to have it locked up.  Informed that hospital won't be responsible for valuables. He says it's okay with that.

## 2012-09-09 NOTE — Transfer of Care (Signed)
Immediate Anesthesia Transfer of Care Note  Patient: [name] [name]  Procedure(s) Performed: Procedure(s): BYPASS GRAFT COMMON FEMORAL TO ANTERIOR TIBIAL ARTERY Using Gore Propaten Vascular Ringed Graft (Left) ENDARTERECTOMY- BELOW KNEE POPLITEAL TO  ANTERIOR TIBIAL  ARTERY AND ILIOFEMORAL ARTERY (Left) PATCH ANGIOPLASTY - POPLITEAL TO ANTERIOR TIBIAL ARTERY AND ILIOFEMORAL ARTERY (Left)  Patient Location: PACU  Anesthesia Type:General  Level of Consciousness: awake, alert  and oriented  Airway & Oxygen Therapy: Patient Spontanous Breathing and Patient connected to nasal cannula oxygen  Post-op Assessment: Report given to PACU RN, Post -op Vital signs reviewed and stable and Patient moving all extremities X 4  Post vital signs: Reviewed and stable  Complications: No apparent anesthesia complications

## 2012-09-09 NOTE — Anesthesia Postprocedure Evaluation (Signed)
  Anesthesia Post-op Note  Patient: [name] [name]  Procedure(s) Performed: Procedure(s): BYPASS GRAFT COMMON FEMORAL TO ANTERIOR TIBIAL ARTERY Using Gore Propaten Vascular Ringed Graft (Left) ENDARTERECTOMY- BELOW KNEE POPLITEAL TO  ANTERIOR TIBIAL  ARTERY AND ILIOFEMORAL ARTERY (Left) PATCH ANGIOPLASTY - POPLITEAL TO ANTERIOR TIBIAL ARTERY AND ILIOFEMORAL ARTERY (Left)  Patient Location: PACU  Anesthesia Type:General  Level of Consciousness: awake and alert   Airway and Oxygen Therapy: Patient Spontanous Breathing and Patient connected to nasal cannula oxygen  Post-op Pain: mild  Post-op Assessment: Post-op Vital signs reviewed, Patient's Cardiovascular Status Stable, Respiratory Function Stable, Patent Airway and No signs of Nausea or vomiting  Post-op Vital Signs: Reviewed and stable  Complications: No apparent anesthesia complications
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
## 2012-09-10 ENCOUNTER — Encounter (HOSPITAL_COMMUNITY): Payer: Self-pay | Admitting: Family Medicine

## 2012-09-10 LAB — CBC
HCT: 29.5 % — ABNORMAL LOW (ref 39.0–52.0)
Hemoglobin: 10.6 g/dL — ABNORMAL LOW (ref 13.0–17.0)
MCH: 30.4 pg (ref 26.0–34.0)
MCHC: 35.9 g/dL (ref 30.0–36.0)
MCV: 84.5 fL (ref 78.0–100.0)
RBC: 3.49 MIL/uL — ABNORMAL LOW (ref 4.22–5.81)

## 2012-09-10 LAB — BASIC METABOLIC PANEL
BUN: 13 mg/dL (ref 6–23)
CO2: 26 mEq/L (ref 19–32)
Calcium: 8.8 mg/dL (ref 8.4–10.5)
Creatinine, Ser: 1.26 mg/dL (ref 0.50–1.35)
GFR calc non Af Amer: 59 mL/min — ABNORMAL LOW (ref >90)
Glucose, Bld: 76 mg/dL (ref 70–99)
Sodium: 137 mEq/L (ref 135–145)

## 2012-09-10 LAB — HEPARIN LEVEL (UNFRACTIONATED): Heparin Unfractionated: <0.1 IU/mL — ABNORMAL LOW (ref 0.30–0.70)

## 2012-09-10 MED ORDER — FENTANYL CITRATE 0.05 MG/ML IJ SOLN
25.0000 ug | INTRAMUSCULAR | Status: DC | PRN
  Administered 2012-09-10 – 2012-09-13 (×13): 50 ug via INTRAVENOUS
  Filled 2012-09-10 (×13): qty 2

## 2012-09-10 MED ORDER — HYDROCODONE-ACETAMINOPHEN 10-325 MG PO TABS
1.0000 | ORAL_TABLET | ORAL | Status: DC | PRN
  Administered 2012-09-11 – 2012-09-12 (×4): 1 via ORAL
  Filled 2012-09-10 (×5): qty 1

## 2012-09-10 MED ORDER — CAMPHOR-MENTHOL 0.5-0.5 % EX LOTN
TOPICAL_LOTION | CUTANEOUS | Status: DC | PRN
  Administered 2012-09-10: 05:00:00 via TOPICAL
  Filled 2012-09-10: qty 222

## 2012-09-10 MED ORDER — HYDROCODONE-ACETAMINOPHEN 7.5-325 MG PO TABS
1.0000 | ORAL_TABLET | ORAL | Status: DC | PRN
  Administered 2012-09-10: 1 via ORAL
  Filled 2012-09-10: qty 1

## 2012-09-10 MED ORDER — FAMOTIDINE 10 MG PO TABS
10.0000 mg | ORAL_TABLET | Freq: Two times a day (BID) | ORAL | Status: DC
  Administered 2012-09-10 – 2012-09-13 (×7): 10 mg via ORAL
  Filled 2012-09-10 (×10): qty 1

## 2012-09-10 NOTE — Progress Notes (Signed)
PT Cancellation Note  Patient Details Name: [name] [name] MRN: [id number] DOB: [birth year]   Cancelled Treatment:    Reason Eval/Treat Not Completed: Pain limiting ability to participate (Pt premedicated but continued to refuse.) Attempted prior to lunch as well as after lunch.   [name] 09/10/2012, 2:06 PM

## 2012-09-10 NOTE — Progress Notes (Signed)
[facility] [place]  Daily Progress Note  Assessment/Planning: POD #1 s/p L CFA to AT/Pop BPG w/ Propaten, L iliofemoral EA w/ BPA, L below-the-knee popliteal and anterior tibial artery EA w/ BPA   Patent LLE bypass: strong multiphasic signals, with warm left foot, faintly palpable pulses  PUD: will start pepcid, given Plavix use  HTN: well controlled on current regimen  Glaucoma: on outpatient regimen:  COPD: off oxygen at this point  Baseline Anemia: suspect preop H/H is hemoconcentrated given limited blood loss intraoperative, patient currently asx with current H/H  Ok to trsfr to floor  Subjective  - 1 Day Post-Op  C/o pruritus  Objective Filed Vitals:   09/09/12 1812 09/09/12 1920 09/10/12 0010 09/10/12 0345  BP: 143/69 138/69 110/61 124/71  Pulse: 57 55 56 73  Temp: 98.1 F (36.7 C) 98.1 F (36.7 C) 99 F (37.2 C) 99.4 F (37.4 C)  TempSrc: Oral Oral Oral Oral  Resp: 15 19 16 20   SpO2: 99% 99% 95% 93%    Intake/Output Summary (Last 24 hours) at 09/10/12 0738 Last data filed at 09/10/12 0600  Gross per 24 hour  Intake 4752.5 ml  Output   3050 ml  Net 1702.5 ml    PULM  CTAB CV  RRR GI  soft, NTND VASC  L groin and calf inc c/d/i, warm foot with multiphasic DP > PT signal, faintly palpable pedal pulses  Laboratory CBC    Component Value Date/Time   WBC 8.4 09/10/2012 0200   WBC 5.6 11/25/2006 1007   HGB 10.6* 09/10/2012 0200   HGB 11.7* 11/25/2006 1007   HCT 29.5* 09/10/2012 0200   HCT 34.1* 11/25/2006 1007   PLT 146* 09/10/2012 0200   PLT 191 11/25/2006 1007    BMET    Component Value Date/Time   NA 137 09/10/2012 0200   K 4.5 09/10/2012 0200   CL 100 09/10/2012 0200   CO2 26 09/10/2012 0200   GLUCOSE 76 09/10/2012 0200   BUN 13 09/10/2012 0200   CREATININE 1.26 09/10/2012 0200   CALCIUM 8.8 09/10/2012 0200   GFRNONAA 59* 09/10/2012 0200   GFRAA 68* 09/10/2012 0200    Leonides Sake, MD [facility]  [place] Office: [phone] Pager: [phone]  09/10/2012, 7:38 AM

## 2012-09-10 NOTE — Progress Notes (Signed)
OT Cancellation Note  Patient Details Name: [name] [name] MRN: [id number] DOB: [birth year]   Cancelled Treatment:    Reason Eval/Treat Not Completed: Pain limiting ability to participate;Fatigue/lethargy limiting ability to participate. Pt declining session this AM, stating he is too tired to participate in session. Will re-attempt later today as time allows.  09/10/2012 Cipriano Mile OTR/L Pager [phone] Office [phone]

## 2012-09-10 NOTE — Progress Notes (Signed)
Pharmacy: Re: itching with multiple pain meds  History of itching "head to toe" with Morphine. Dilaudid ordered post-op 8/13, received multiple doses, but also used Benadryl x 2.  Changed to Fentanyl IV today. Has had 1 dose so far. Currently not itching. Could continue this for IV pain med option.  Uses Hydrocodone-Acetaminophen 7.5-325 mg at home without problems.  He thinks his strength was increased, but most recent refill at [facility] was for that strength, q6 hrs prn, in April. [place], and can't [place] Rx name.  Current order is for 1 tablet q4h prn.  He also reports sometimes using it more frequently at home, and has taken 2 tablets at times.  Could consider increasing Hydrocodone 7.5-acetaminophen 325 mg to 1-2 tablets q4 hr prn, or increasing to 10-325 mg tablets.  To stay under 4 grams APAP/24 hrs, would not increase frequency.  Nicolette Bang, RPh Pager: [phone] 09/10/2012 12:31 PM

## 2012-09-10 NOTE — Progress Notes (Signed)
Pt report called to 2W RN, VSS, meds current to time. Pain med given at 0900, next RN aware of time. Will continue to monitor.

## 2012-09-10 NOTE — Progress Notes (Signed)
Received to unit 2000. Assessed per flow sheet. No changes noted. Patient c/o incisional pain 9/10 on pain scale. Patient requested pain medicine but it is not due. Patient called family to tell them to bring pain medicine from home. Explained to patient that he can not received outside medication. Patient insist to get his own medicine

## 2012-09-10 NOTE — Progress Notes (Signed)
Utilization review completed.  

## 2012-09-11 LAB — CBC
HCT: 33.8 % — ABNORMAL LOW (ref 39.0–52.0)
MCHC: 34 g/dL (ref 30.0–36.0)
MCV: 85.4 fL (ref 78.0–100.0)
RDW: 13.6 % (ref 11.5–15.5)

## 2012-09-11 NOTE — Progress Notes (Signed)
[facility] [place]  Daily Progress Note  Assessment/Planning: POD #2 s/p L CFA to AT/Pop BPG w/ Propaten, L iliofemoral EA w/ BPA, L below-the-knee popliteal and anterior tibial artery EA w/ BPA   Unclear if pt truly in pain as he complains but is calmly sitting in bed eating breakfast  Will trial some Fentanyl to see if breakthrough pain can be managed while minimizing the pruritus  PT/OT c/s:  as usual, pt somewhat uncooperative and refused PT/OT yesterday  ABI pending  H/H stable: do not suspect acute intraoperative blood loss as source of drop, more likely low baseline anemia with hemoconcentration on preop sample  Home likely early next week   Subjective  - 2 Days Post-Op  C/o pain  Objective Filed Vitals:   09/10/12 1017 09/10/12 1330 09/10/12 2044 09/11/12 0438  BP: 134/71 125/69 137/67 141/76  Pulse: 60 65 66 76  Temp: 98.3 F (36.8 C) 98.1 F (36.7 C) 100.8 F (38.2 C) 100.3 F (37.9 C)  TempSrc: Oral Oral Oral Oral  Resp: 18 18 19 17   Height:      Weight:      SpO2: 100% 99% 96% 100%    Intake/Output Summary (Last 24 hours) at 09/11/12 0735 Last data filed at 09/10/12 1927  Gross per 24 hour  Intake    490 ml  Output   1300 ml  Net   -810 ml    PULM  CTAB CV  RRR GI  soft, NTND VASC  L groin and calf incision c/d/i, warm foot, faintly palpable DP  Laboratory CBC    Component Value Date/Time   WBC 11.6* 09/11/2012 0417   WBC 5.6 11/25/2006 1007   HGB 11.5* 09/11/2012 0417   HGB 11.7* 11/25/2006 1007   HCT 33.8* 09/11/2012 0417   HCT 34.1* 11/25/2006 1007   PLT 146* 09/11/2012 0417   PLT 191 11/25/2006 1007    BMET    Component Value Date/Time   NA 137 09/10/2012 0200   K 4.5 09/10/2012 0200   CL 100 09/10/2012 0200   CO2 26 09/10/2012 0200   GLUCOSE 76 09/10/2012 0200   BUN 13 09/10/2012 0200   CREATININE 1.26 09/10/2012 0200   CALCIUM 8.8 09/10/2012 0200   GFRNONAA 59* 09/10/2012 0200   GFRAA 68* 09/10/2012 0200     Leonides Sake, MD [facility] [place] Office: [phone] Pager: [phone]  09/11/2012, 7:35 AM

## 2012-09-11 NOTE — Progress Notes (Signed)
OT Cancellation Note  Patient Details Name: Eustace Hur MRN: [id number] DOB: 06/10/1945   Cancelled Treatment:    Reason Eval/Treat Not Completed: Patient declined, no reason specified Refused to participate after PT. OT signing off.  [facility] Joevon Holliman, OTR/L  [phone] 09/11/2012 09/11/2012, 5:20 PM

## 2012-09-11 NOTE — Progress Notes (Signed)
MD notified of patient's temperature was elevated the whole day and currently trending high, latest at 101.2 taken orally. Informed MD that patient has been ambulatory the whole day.  MD advised to offer incentive spirometry, given to patient, instructed on use.  Ordered CBC check tom morning.  Will monitor temp at intervals.  Will evaluate patient.

## 2012-09-11 NOTE — Progress Notes (Signed)
Patient ambulated 550 ft independently around unit x2 using walker w/out difficulty. Returned to room w/out incidence. Call bell near.Mamie Levers

## 2012-09-11 NOTE — Evaluation (Signed)
Physical Therapy Evaluation Patient Details Name: [name] [name] MRN: [id number] DOB: [birth year] Today's Date: 09/11/2012 Time: 4098-1191 PT Time Calculation (min): 9 min  PT Assessment / Plan / Recommendation History of Present Illness  LLE BPG  Clinical Impression  Pt agitated by therapy arrival but agreeable to evaluation with limited questions. Pt able to perform all functional mobility that he would agree to participate in: hall ambulation, stairs, bed mobility and reaching to floor to take sock off and on. No noted antalgic gait and pt states current function is his baseline. Pt would not readily answer all questions like what happened for him to fall previously. Pt with basic mobility at baseline and pt resistant and agitated by the mention of any further assessment, therefore will sign off. Should pt status change please reorder. Recommend daily hall ambulation. Pt stated "I don't like peoples bugging me"    PT Assessment  Patent does not need any further PT services    Follow Up Recommendations  No PT follow up    Does the patient have the potential to tolerate intense rehabilitation      Barriers to Discharge        Equipment Recommendations  None recommended by PT    Recommendations for Other Services     Frequency      Precautions / Restrictions Precautions Precautions: None   Pertinent Vitals/Pain No pain     Mobility  Bed Mobility Bed Mobility: Supine to Sit;Sit to Supine Supine to Sit: 6: Modified independent (Device/Increase time);HOB flat Sit to Supine: 6: Modified independent (Device/Increase time);HOB flat Transfers Transfers: Sit to Stand;Stand to Sit Sit to Stand: 7: Independent;From bed Stand to Sit: 7: Independent;To bed Ambulation/Gait Ambulation/Gait Assistance: 7: Independent Ambulation Distance (Feet): 350 Feet Assistive device: None Gait Pattern: Within Functional Limits Gait velocity: decreased Stairs: Yes Stairs Assistance: 6: Modified  independent (Device/Increase time) Stair Management Technique: One rail Right Number of Stairs: 11    Exercises     PT Diagnosis:    PT Problem List:   PT Treatment Interventions:       PT Goals(Current goals can be found in the care plan section) Acute Rehab PT Goals PT Goal Formulation: No goals set, d/c therapy  Visit Information  Last PT Received On: 09/11/12 Assistance Needed: +1 History of Present Illness: LLE BPG       Prior Functioning  Home Living Family/patient expects to be discharged to:: Private residence Living Arrangements: Alone Type of Home: House Home Access: Level entry Home Layout: Two level;Bed/bath upstairs Alternate Level Stairs-Number of Steps: 14 Alternate Level Stairs-Rails: Right Home Equipment: None Prior Function Level of Independence: Independent Communication Communication: No difficulties    Cognition  Cognition Arousal/Alertness: Awake/alert Behavior During Therapy: Agitated Overall Cognitive Status: Within Functional Limits for tasks assessed    Extremity/Trunk Assessment Upper Extremity Assessment Upper Extremity Assessment: Overall WFL for tasks assessed Lower Extremity Assessment Lower Extremity Assessment: Overall WFL for tasks assessed Cervical / Trunk Assessment Cervical / Trunk Assessment: Normal   Balance    End of Session PT - End of Session Activity Tolerance: Patient tolerated treatment well Patient left: in bed;with call bell/phone within reach;with family/visitor present  GP     Toney Sang Beth 09/11/2012, 12:08 PM  Delaney Meigs, PT [phone]

## 2012-09-11 NOTE — Care Management Note (Signed)
    Page 1 of 1   09/14/2012     4:42:16 PM   CARE MANAGEMENT NOTE 09/14/2012  Patient:  [name] [name],[name] [name]   Account Number:  [id number]  Date Initiated:  09/11/2012  Documentation initiated by:  Bevan Disney  Subjective/Objective Assessment:   PT S/P FEM POP ON 09/09/12.  PTA, PT INDEPENDENT, HAS SIGNIFICANT OTHER.     Action/Plan:   WILL FOLLOW FOR HOME NEEDS AS PT PROGRESSES.   Anticipated DC Date:  09/13/2012   Anticipated DC Plan:  HOME W HOME HEALTH SERVICES      DC Planning Services  CM consult      Choice offered to / List presented to:             Status of service:  Completed, signed off Medicare Important Message given?   (If response is "NO", the following Medicare IM given date fields will be blank) Date Medicare IM given:   Date Additional Medicare IM given:    Discharge Disposition:  HOME/SELF CARE  Per UR Regulation:  Reviewed for med. necessity/level of care/duration of stay  If discussed at Long Length of Stay Meetings, dates discussed:    Comments:  09/14/12 [name] Gronewold,RN,BSN [phone] PT DENIED ANY HOME NEEDS PRIOR TO DC HOME, PER BEDSIDE RN.

## 2012-09-12 DIAGNOSIS — Z48812 Encounter for surgical aftercare following surgery on the circulatory system: Secondary | ICD-10-CM

## 2012-09-12 LAB — CBC WITH DIFFERENTIAL/PLATELET
Basophils Relative: 0 % (ref 0–1)
Eosinophils Absolute: 0.3 10*3/uL (ref 0.0–0.7)
MCH: 30.2 pg (ref 26.0–34.0)
MCHC: 36.2 g/dL — ABNORMAL HIGH (ref 30.0–36.0)
Neutrophils Relative %: 66 % (ref 43–77)
Platelets: 151 10*3/uL (ref 150–400)

## 2012-09-12 MED ORDER — HYDROCODONE-ACETAMINOPHEN 7.5-325 MG PO TABS
1.0000 | ORAL_TABLET | ORAL | Status: DC | PRN
  Administered 2012-09-12 – 2012-09-14 (×9): 2 via ORAL
  Filled 2012-09-12 (×9): qty 2

## 2012-09-12 NOTE — Progress Notes (Signed)
VASCULAR LAB PRELIMINARY  ARTERIAL  ABI completed:    RIGHT    LEFT    PRESSURE WAVEFORM  PRESSURE WAVEFORM  BRACHIAL  triphasic BRACHIAL 127 triphasic  DP   DP    AT 54 Severely dampened monophasic AT 86 monophasic  PT 37 Severely dampened monophasic PT 79 monophasic  PER   PER    GREAT TOE  NA GREAT TOE  NA    RIGHT LEFT  ABI 0.43 0.68     [name] [name], RVT 09/12/2012, 10:16 AM

## 2012-09-12 NOTE — Progress Notes (Signed)
Informed by RN, [name] [name] that pt stated he had his own pain medication with him and was going to take that since it was not time to take his pain medication that is scheduled here. Spoke with patient about this and informed him that we have to take his home pain medication to the main pharmacy so that he would not take it and interfere with the medications he is receiving here. Education pt on the dangers of him taking his medication on top of ones we have given him. Pt first stated he did not have the medication with him, then pt stated he had taken all the pills and the bottle was empty. This RN informed the pt that it was hard to believe him with this mix up of stories when he had told RN [name] [name] he was going to take his own medication. Pt upset and would not allow RN to take medication or look for it. Notified PA and charge nurse of this. PA spoke with patient about this issue and will increase the dosage of his pain medication here. Will continue to monitor.

## 2012-09-12 NOTE — Progress Notes (Signed)
Patient asked for pain medication at 0815. His oxycodone was prescribed PRN q4 hours. His last administration was at approx. 1914. I informed the patient that it would be another hour before I could give him oxycodone for pain. Patient stated, "Just forget it, I'll take what I got from home." I asked patient what did he mean, and he told me that he brought in his pain medication and was going to take that since it would be an hour before I could give him any. I informed the patient that he could not take his own medication from home, because it would interfere with our administration of pain medicine. I also educated the patient of the potential risk associated with taking his medications from home, plus what he was prescribed to take here. Notified [place], RN, [place] ([place]), and Selena Batten (Press photographer). When confronted by the MD the patient recanted his statement about having his medication from home, [name] [name], [name] [name]

## 2012-09-12 NOTE — Progress Notes (Addendum)
[facility] [place]  Progress Note Bypass Surgery  Date of Surgery: 09/09/2012  Procedure(s): BYPASS GRAFT COMMON FEMORAL TO ANTERIOR TIBIAL ARTERY Using Gore Propaten Vascular Ringed Graft ENDARTERECTOMY- BELOW KNEE POPLITEAL TO  ANTERIOR TIBIAL  ARTERY AND ILIOFEMORAL ARTERY PATCH ANGIOPLASTY - POPLITEAL TO ANTERIOR TIBIAL ARTERY AND ILIOFEMORAL ARTERY Surgeon: Surgeon(s): Fransisco Hertz, MD  3 Days Post-Op  History of Present Illness  [name] [name] is a 67 y.o. male who is  3 Days Post-Op. Still having pain issues. He states he takes 1-2 vicodin 7.5/325 as needed. Still taking fentanyl IV as needed. Otherwise doing well. Has ischemic disease and pain in both limbs  Significant Diagnostic Studies: CBC Lab Results  Component Value Date   WBC 13.5* 09/12/2012   HGB 11.1* 09/12/2012   HCT 30.7* 09/12/2012   MCV 83.4 09/12/2012   PLT 151 09/12/2012    BMET    Component Value Date/Time   NA 137 09/10/2012 0200   K 4.5 09/10/2012 0200   CL 100 09/10/2012 0200   CO2 26 09/10/2012 0200   GLUCOSE 76 09/10/2012 0200   BUN 13 09/10/2012 0200   CREATININE 1.26 09/10/2012 0200   CALCIUM 8.8 09/10/2012 0200   GFRNONAA 59* 09/10/2012 0200   GFRAA 68* 09/10/2012 0200    COAG Lab Results  Component Value Date   INR 0.94 09/02/2012   INR 0.95 05/14/2010   INR 0.9 10/22/2006   No results found for this basename: PTT    Physical Examination  BP Readings from Last 3 Encounters:  09/12/12 110/58  09/12/12 110/58  09/02/12 129/81   Temp Readings from Last 3 Encounters:  09/12/12 101 F (38.3 C) Oral  09/12/12 101 F (38.3 C) Oral  09/02/12 98.1 F (36.7 C)    SpO2 Readings from Last 3 Encounters:  09/12/12 97%  09/12/12 97%  09/02/12 95%   Pulse Readings from Last 3 Encounters:  09/12/12 89  09/12/12 89  09/02/12 74    Pt is A&O x 3 left lower extremity: Incision/s is/are clean,dry.intact, and  healing without hematoma, erythema or drainage Limb is warm;  with good color  Assessment/Plan: Pt. Doing well Post-op pain is not well controlled Changed meds to his stated home dose Wounds are healing well PT/OT for ambulation Continue wound care as ordered  [name] [name]  09/12/2012 9:31 AM   I have examined the patient, reviewed and agree with above.  [name], MD 09/12/2012 10:36 AM

## 2012-09-13 NOTE — Progress Notes (Signed)
Pt ambulated multiple times around unit this morning independently.

## 2012-09-13 NOTE — Progress Notes (Signed)
Subjective: Interval History: none.. pain control is better today. The fever resolved.  Objective: Vital signs in last 24 hours: Temp:  [98.2 F (36.8 C)-99.4 F (37.4 C)] 98.9 F (37.2 C) (08/17 0533) Pulse Rate:  [75-85] 75 (08/17 0533) Resp:  [16-18] 18 (08/17 0533) BP: (94-122)/(59-83) 116/59 mmHg (08/17 0533) SpO2:  [97 %-99 %] 97 % (08/17 0533)  Intake/Output from previous day: 08/16 0701 - 08/17 0700 In: 600 [P.O.:600] Out: 700 [Urine:700] Intake/Output this shift:    Physical exam: Left groin and below-knee incisions are healing nicely. Foot well-perfused.  Lab Results:  Recent Labs  09/11/12 0417 09/12/12 0500  WBC 11.6* 13.5*  HGB 11.5* 11.1*  HCT 33.8* 30.7*  PLT 146* 151   BMET No results found for this basename: NA, K, CL, CO2, GLUCOSE, BUN, CREATININE, CALCIUM,  in the last 72 hours  Studies/Results: No results found. Anti-infectives: Anti-infectives   Start     Dose/Rate Route Frequency Ordered Stop   09/09/12 2000  cefUROXime (ZINACEF) 1.5 g in dextrose 5 % 50 mL IVPB     1.5 g 100 mL/hr over 30 Minutes Intravenous Every 12 hours 09/09/12 1816 09/10/12 0930   09/08/12 1450  cefUROXime (ZINACEF) 1.5 g in dextrose 5 % 50 mL IVPB     1.5 g 100 mL/hr over 30 Minutes Intravenous 30 min pre-op 09/08/12 1450 09/09/12 0835      Assessment/Plan: s/p Procedure(s): BYPASS GRAFT COMMON FEMORAL TO ANTERIOR TIBIAL ARTERY Using Gore Propaten Vascular Ringed Graft (Left) ENDARTERECTOMY- BELOW KNEE POPLITEAL TO  ANTERIOR TIBIAL  ARTERY AND ILIOFEMORAL ARTERY (Left) PATCH ANGIOPLASTY - POPLITEAL TO ANTERIOR TIBIAL ARTERY AND ILIOFEMORAL ARTERY (Left) Pain control better. Continue to mobilize.   LOS: 4 days   EARLY, TODD 09/13/2012, 9:21 AM

## 2012-09-14 ENCOUNTER — Telehealth: Payer: Self-pay | Admitting: Vascular Surgery

## 2012-09-14 ENCOUNTER — Telehealth: Payer: Self-pay | Admitting: Internal Medicine

## 2012-09-14 MED ORDER — ASPIRIN 81 MG PO TBEC: 81.0000 mg | DELAYED_RELEASE_TABLET | Freq: Every day | ORAL | Status: DC

## 2012-09-14 MED ORDER — CLOPIDOGREL BISULFATE 75 MG PO TABS: 75.0000 mg | ORAL_TABLET | Freq: Every day | ORAL | Status: DC

## 2012-09-14 MED ORDER — HYDROCODONE-ACETAMINOPHEN 7.5-325 MG PO TABS: 1.0000 | ORAL_TABLET | ORAL | Status: DC | PRN

## 2012-09-14 NOTE — Discharge Summary (Addendum)
[facility] Discharge Summary   Patient ID:  [name] [name] MRN: [id number] DOB/AGE: 65-May-1947 65 y.o.  Admit date: 09/09/2012 Discharge date: 09/14/2012 Date of Surgery: 09/09/2012 Surgeon: Surgeon(s): Fransisco Hertz, MD  Admission Diagnosis: Peripheral Vascular Disease  Discharge Diagnoses:  Peripheral Vascular Disease  Secondary Diagnoses: Past Medical History  Diagnosis Date  . Peripheral vascular disease     with claudication  . Pancreatitis     chronic  . Hypertension   . Glaucoma   . COPD (chronic obstructive pulmonary disease)   . Ulcer     peptic ulcer disease  . H/O alcohol abuse   . History of tobacco abuse   . PONV (postoperative nausea and vomiting)   . Anemia   . GERD (gastroesophageal reflux disease)     "NOT TO BAD NOW"  . Carpal tunnel syndrome, bilateral     Procedure(s): BYPASS GRAFT COMMON FEMORAL TO ANTERIOR TIBIAL ARTERY Using Gore Propaten Vascular Ringed Graft ENDARTERECTOMY- BELOW KNEE POPLITEAL TO  ANTERIOR TIBIAL  ARTERY AND ILIOFEMORAL ARTERY PATCH ANGIOPLASTY - POPLITEAL TO ANTERIOR TIBIAL ARTERY AND ILIOFEMORAL ARTERY  Discharged Condition: good  HPI:  [name] [name] is a 67 y.o. male who presents for postoperative follow-up from procedure on Date: 08/03/12: PTA R CIA and L EIA . The patient notes no change in lower extremity symptoms. The patient is able to complete their activities of daily living. The patient's current symptoms are: B intermittent claudication L>R. The pt denies any rest pain or ulcers or gangrene. He has completed his nuclear stress testing which was read as low risk. Unfortunately, patient has a high grade stenosis in L distal popliteal artery which will likely occlude in the future resulting in acute ischemia of L leg. Based on his angiographic findings, this patient needs: L fem and pop EA with BPA, L fem-pop w/ propaten (as vein conduit inadequate). The risk, benefits, and alternative for bypass  operations were discussed with the patient. The patient is aware the risks include but are not limited to: bleeding, infection, myocardial infarction, stroke, limb loss, nerve damage, need for additional procedures in the future, wound complications, and inability to complete the bypass. The patient is aware of these risks and agreed to proceed on 13 AUG 14.   Hospital Course:  [name] is a 67 y.o. male is S/P  Left iliofemoral endarterectomy with bovine patch angioplasty  2. Left common femoral artery to anterior tibial artery and below-the-knee popliteal artery bypass with Propaten  3. Left below-the-knee popliteal artery endarterectomy with bovine patch angioplasty  4. Left proximal anterior tibial artery endarterectomy with bovine patch angioplasty   ABI's post -op  Right    0.43        Left    0.68 Extubated: POD # 0 Physical exam: wounds on left leg healing without hematoma or drainage Left foot warm with good sensation and motion Post-op wounds healing well Pt. Ambulating, voiding and taking PO diet without difficulty. Pt pain controlled with PO pain meds. Labs as below Complications:none  Consults:     Significant Diagnostic Studies: CBC Lab Results  Component Value Date   WBC 13.5* 09/12/2012   HGB 11.1* 09/12/2012   HCT 30.7* 09/12/2012   MCV 83.4 09/12/2012   PLT 151 09/12/2012    BMET    Component Value Date/Time   NA 137 09/10/2012 0200   K 4.5 09/10/2012 0200   CL 100 09/10/2012 0200   CO2 26 09/10/2012 0200   GLUCOSE 76 09/10/2012  0200   BUN 13 09/10/2012 0200   CREATININE 1.26 09/10/2012 0200   CALCIUM 8.8 09/10/2012 0200   GFRNONAA 59* 09/10/2012 0200   GFRAA 68* 09/10/2012 0200   COAG Lab Results  Component Value Date   INR 0.94 09/02/2012   INR 0.95 05/14/2010   INR 0.9 10/22/2006     Disposition:  Discharge to :Home Discharge Orders   Future Orders Complete By Expires   Call MD for:  redness, tenderness, or signs of infection (pain, swelling,  bleeding, redness, odor or green/yellow discharge around incision site)  As directed    Call MD for:  severe or increased pain, loss or decreased feeling  in affected limb(s)  As directed    Call MD for:  temperature >100.5  As directed    Driving Restrictions  As directed    Comments:     No driving for 2 weeks   Increase activity slowly  As directed    Comments:     Walk with assistance use walker or cane as needed   May shower   As directed    may wash over wound with mild soap and water  As directed    No dressing needed  As directed    Resume previous diet  As directed        Medication List         amitriptyline 50 MG tablet  Commonly known as:  ELAVIL  Take 1 tablet by mouth daily.     amLODipine 10 MG tablet  Commonly known as:  NORVASC  Take 10 mg by mouth daily.     aspirin 81 MG EC tablet  Take 1 tablet (81 mg total) by mouth daily.     clopidogrel 75 MG tablet  Commonly known as:  PLAVIX  Take 1 tablet (75 mg total) by mouth daily with breakfast.     colchicine 0.6 MG tablet  Take 0.6 mg by mouth daily.     ferrous sulfate 325 (65 FE) MG tablet  Take 325 mg by mouth daily with breakfast.     hydrochlorothiazide 12.5 MG capsule  Commonly known as:  MICROZIDE  Take 12.5 mg by mouth daily.     HYDROcodone-acetaminophen 7.5-325 MG per tablet  Commonly known as:  NORCO  Take 1-2 tablets by mouth every 4 (four) hours as needed.     latanoprost 0.005 % ophthalmic solution  Commonly known as:  XALATAN  Place 1 drop into both eyes daily.     meloxicam 15 MG tablet  Commonly known as:  MOBIC  Take 15 mg by mouth daily.     nicotine polacrilex 4 MG gum  Commonly known as:  NICORETTE  Take 4 mg by mouth 4 (four) times daily as needed for smoking cessation.     nitroGLYCERIN 0.4 MG SL tablet  Commonly known as:  NITROSTAT  Place 0.4 mg under the tongue every 5 (five) minutes as needed for chest pain.     PROAIR HFA 108 (90 BASE) MCG/ACT inhaler  Generic  drug:  albuterol  Inhale 2 puffs into the lungs every 4 (four) hours as needed for wheezing or shortness of breath.       Verbal and written Discharge instructions given to the patient. Wound care per Discharge AVS   Signed: Marlowe Shores 09/14/2012, 7:48 AM  Addendum  I have independently interviewed and examined the patient, and I agree with the physician assistant's discharge summary.  This patient underwent left iliofemoral endarterectomy  with bovine patch angioplasty, endarterectomy of left below-the-knee popliteal artery and anterior tibial artery with bovine patch angioplasty and common femoral artery to below-the-knee popliteal and anterior tibial artery bypass with Propaten.  The patient's post-operative course was remarkable only for difficulty with pain medication titration.  By POD#5, the patient is ambulating without significant pain.  His left foot is warm with multiphasic signals, which is significantly better than his preoperative presentation.  He will follow up in two weeks for wound check in the office.  Leonides Sake, MD [facility] [place] Office: [phone] Pager: [phone]  09/14/2012, 9:34 AM   - For VQI Registry use --- Instructions: Press F2 to tab through selections.  Delete question if not applicable.   Post-op:  Wound infection: No  Graft infection: No  Transfusion: No   New Arrhythmia: Yes Ipsilateral amputation: [x]  no, [ ]  Minor, [ ]  BKA, [ ]  AKA Discharge patency: [x]  Primary, [ ]  Primary assisted, [ ]  Secondary, [ ]  Occluded Patency judged by: [x]  Doppler only, [ ]  Palpable graft pulse, [ ]  Palpable distal pulse, [ ]  ABI inc. > 0.15, [ ]  Duplex Discharge ABI: R 0.43, L 0.68 Discharge TBI: R not done, L not done D/C Ambulatory Status: Ambulatory  Complications: MI: [x]  No, [ ]  Troponin only, [ ]  EKG or Clinical CHF: No Resp failure: [x ] none, [ ]  Pneumonia, [ ]  Ventilator Chg in renal function: [x ] none, [ ]   Inc. Cr > 0.5, [ ]  Temp. Dialysis, [ ]  Permanent dialysis Stroke: [x ] None, [ ]  Minor, [ ]  Major Return to OR: No  Reason for return to OR: [ ]  Bleeding, [ ]  Infection, [ ]  Thrombosis, [ ]  Revision  Discharge medications: Statin use:  No Non-Compliant ASA use:  No  for medical reason : Plavix use Plavix use:  Yes Beta blocker use: No  for medical reason : on Norvasc Coumadin use: No  for medical reason : not indicated

## 2012-09-14 NOTE — Telephone Encounter (Signed)
Message copied by Jena Gauss on Mon Sep 14, 2012  1:05 PM ------      Message from: Marlowe Shores      Created: Mon Sep 14, 2012  7:47 AM       3-4 week f/u bypass Imogene Burn ------

## 2012-09-14 NOTE — Progress Notes (Signed)
Discharge instructions provided. IV d/c'd with catheter intact. Left groin and leg incisions intact. No home health needs at this time per PA. Patient refused transport via wheelchair to lobby. Patient ambulated independently for d/c home.Mamie Levers

## 2012-09-14 NOTE — Progress Notes (Addendum)
[facility] [place]  Daily Progress Note  Assessment/Planning: POD #5 s/p POD #1 s/p L CFA to AT/Pop BPG w/ Propaten, L iliofemoral EA w/ BPA, L below-the-knee popliteal and anterior tibial artery EA w/ BPA   Ambulating with greatly decreased pain  Pt would like to be D/C today  H/H stable and hemodynamically stable  Post-op ABI somewhat disappointing but warm feet with strongly dopplerable signals (markedly better than preop).  Suspect significant residual tibial calcification.  Will reinterrogate bypass on follow-up  Ok to D/C home with follow in 2 weeks  Subjective  - 5 Days Post-Op  Pain decreased  Objective Filed Vitals:   09/13/12 1435 09/13/12 1947 09/13/12 2033 09/14/12 0645  BP: 101/60  135/79 135/78  Pulse: 68  69 65  Temp: 97.9 F (36.6 C) 98.7 F (37.1 C)  98.7 F (37.1 C)  TempSrc: Oral   Oral  Resp: 18  18 18   Height:      Weight:      SpO2: 99%  100% 100%    Intake/Output Summary (Last 24 hours) at 09/14/12 0743 Last data filed at 09/14/12 0600  Gross per 24 hour  Intake   1750 ml  Output   1900 ml  Net   -150 ml    PULM  CTAB CV  RRR GI  soft, NTND VASC  L groin inc c/d/i, L calf inc c/d/i, L calf somewhat swollen, L leg and foot warm throughout   Laboratory CBC    Component Value Date/Time   WBC 13.5* 09/12/2012 0500   WBC 5.6 11/25/2006 1007   HGB 11.1* 09/12/2012 0500   HGB 11.7* 11/25/2006 1007   HCT 30.7* 09/12/2012 0500   HCT 34.1* 11/25/2006 1007   PLT 151 09/12/2012 0500   PLT 191 11/25/2006 1007    BMET    Component Value Date/Time   NA 137 09/10/2012 0200   K 4.5 09/10/2012 0200   CL 100 09/10/2012 0200   CO2 26 09/10/2012 0200   GLUCOSE 76 09/10/2012 0200   BUN 13 09/10/2012 0200   CREATININE 1.26 09/10/2012 0200   CALCIUM 8.8 09/10/2012 0200   GFRNONAA 59* 09/10/2012 0200   GFRAA 68* 09/10/2012 0200    Leonides Sake, MD [facility] [place] Office: [phone] Pager:  [phone]  09/14/2012, 7:43 AM

## 2012-09-14 NOTE — Telephone Encounter (Signed)
Received 132 pages from [employer]. Spruill, MD Cardiology Internal Medicine, sent to Dr. Rhea Belton. 09/14/12/ss

## 2012-09-15 ENCOUNTER — Telehealth: Payer: Self-pay

## 2012-09-15 NOTE — Telephone Encounter (Signed)
Pt. Called to question about amount of swelling in left foot and lower leg.  States when he was discharged yesterday, he didn't go straight home, and may have done too much.  Reports that his sock on (L) foot was damp after he removed it last night.  States his foot is very swollen, and that it was tight before he left the hospital.   Encouraged to shower with an antibacterial soap, and to keep the incisional areas and left lower extremity clean/ dry.  Encouraged to elevate his left leg above level of heart, to decrease the amount of swelling in the tissues.  Pt. denies any redness/ drainage from incisional areas of left leg.  Denies any fever/chills.  Denies any increased pain in left leg/calf.  Pt. encouraged to elevate, monitor for changes in left leg, and call office if symptoms don't improve or worsen.  Verb. Understanding.

## 2012-10-05 ENCOUNTER — Encounter: Payer: Self-pay | Admitting: Internal Medicine

## 2012-10-05 ENCOUNTER — Telehealth: Payer: Self-pay | Admitting: *Deleted

## 2012-10-05 NOTE — Telephone Encounter (Signed)
error 

## 2012-10-05 NOTE — Telephone Encounter (Deleted)
Pt c/o abd pain x 1 week; he has a hx of pancreatitis. Pt was just released from the hospital after vascular surgery.

## 2012-10-05 NOTE — Telephone Encounter (Signed)
Pt c/o abd pain x 1 week; hx of pancreatitis and he was just released from the hospital after vascular surgery. He was given an appt on 10/07/12.

## 2012-10-07 ENCOUNTER — Ambulatory Visit: Payer: PRIVATE HEALTH INSURANCE | Admitting: Internal Medicine

## 2012-10-15 ENCOUNTER — Encounter: Payer: Self-pay | Admitting: Vascular Surgery

## 2012-10-16 ENCOUNTER — Ambulatory Visit (INDEPENDENT_AMBULATORY_CARE_PROVIDER_SITE_OTHER): Payer: Medicare Other | Admitting: Vascular Surgery

## 2012-10-16 ENCOUNTER — Encounter: Payer: Self-pay | Admitting: Vascular Surgery

## 2012-10-16 ENCOUNTER — Encounter: Payer: Self-pay | Admitting: Internal Medicine

## 2012-10-16 VITALS — BP 139/89 | HR 68 | Temp 97.9°F | Ht 67.0 in | Wt 127.0 lb

## 2012-10-16 DIAGNOSIS — I739 Peripheral vascular disease, unspecified: Secondary | ICD-10-CM

## 2012-10-16 DIAGNOSIS — I70219 Atherosclerosis of native arteries of extremities with intermittent claudication, unspecified extremity: Secondary | ICD-10-CM

## 2012-10-16 DIAGNOSIS — Z48812 Encounter for surgical aftercare following surgery on the circulatory system: Secondary | ICD-10-CM

## 2012-10-16 NOTE — Progress Notes (Signed)
[facility] [place]  Postoperative Visit  History of Present Illness  [name] [name] is a 67 y.o. year old male who presents for postoperative follow-up for:  PROCEDURE:  1. Left iliofemoral endarterectomy with bovine patch angioplasty  2. Left common femoral artery to anterior tibial artery and below-the-knee popliteal artery bypass with Propaten  3. Left below-the-knee popliteal artery endarterectomy with bovine patch angioplasty  4. Left proximal anterior tibial artery endarterectomy with bovine patch angioplasty (Date: 09/09/12).  The patient's wounds are healed.  The patient notes some persistent L foot pain.  The patient is able to complete their activities of daily living.  The patient's current symptoms are: L foot pain and swelling.  The patient continues to smoke but at a less extent per the patient..  For VQI Use Only  PRE-ADM LIVING: Home  AMB STATUS: Ambulatory  Physical Examination  Filed Vitals:   10/16/12 0836  BP: 139/89  Pulse: 68  Temp: 97.9 F (36.6 C)   LLE: Incisions are healed, L DP faintly palpable, L  Calf circumferene > R but no palpable edema, warm left foot  Medical Decision Making  [name] [name] is a 67 y.o. year old male who presents s/p endarterectomy and patch angioplasty of L CFA, BK pop and AT, L CFA to Pop/AT bypass with Propaten.  The patient's bypass incisions are healing appropriately with some improvement in pre-operative symptoms. I discussed in depth with the patient the nature of atherosclerosis, and emphasized the importance of maximal medical management including strict control of blood pressure, blood glucose, and lipid levels, obtaining regular exercise, and cessation of smoking.  The patient is aware that without maximal medical management the underlying atherosclerotic disease process will progress, limiting the benefit of any interventions. The patient's surveillance will included ABI and bypass duplex studies  which will be completed in: 3 months, at which time the patient will be re-evaluated.   I emphasized the importance of routine surveillance of the patient's bypass, as the vascular surgery literature emphasize the improved patency possible with assisted primary patency procedures versus secondary patency procedures. The patient agrees to participate in their maximal medical care and routine surveillance. The patient is currently not on a statin for medical reasons per patient. The patient is currently on an anti-platelet: ASA and plavix.  Thank you for allowing [place] to participate in this patient's care.   Leonides Sake, MD [facility] [place] Office: [phone] Pager: [phone]  10/16/2012, 9:29 AM

## 2012-10-16 NOTE — Addendum Note (Signed)
Addended by: Adria Dill L on: 10/16/2012 10:37 AM   Modules accepted: Orders

## 2012-10-20 ENCOUNTER — Encounter: Payer: Self-pay | Admitting: Internal Medicine

## 2012-10-20 ENCOUNTER — Other Ambulatory Visit (INDEPENDENT_AMBULATORY_CARE_PROVIDER_SITE_OTHER): Payer: Medicare Other

## 2012-10-20 ENCOUNTER — Ambulatory Visit (INDEPENDENT_AMBULATORY_CARE_PROVIDER_SITE_OTHER): Payer: Medicare Other | Admitting: Internal Medicine

## 2012-10-20 VITALS — BP 140/70 | HR 72 | Ht 66.14 in | Wt 127.0 lb

## 2012-10-20 DIAGNOSIS — K861 Other chronic pancreatitis: Secondary | ICD-10-CM

## 2012-10-20 DIAGNOSIS — J449 Chronic obstructive pulmonary disease, unspecified: Secondary | ICD-10-CM

## 2012-10-20 DIAGNOSIS — Z85118 Personal history of other malignant neoplasm of bronchus and lung: Secondary | ICD-10-CM

## 2012-10-20 DIAGNOSIS — F1021 Alcohol dependence, in remission: Secondary | ICD-10-CM

## 2012-10-20 DIAGNOSIS — Z86010 Personal history of colonic polyps: Secondary | ICD-10-CM

## 2012-10-20 DIAGNOSIS — Z87898 Personal history of other specified conditions: Secondary | ICD-10-CM

## 2012-10-20 DIAGNOSIS — R1012 Left upper quadrant pain: Secondary | ICD-10-CM

## 2012-10-20 LAB — CBC
MCV: 85.6 fl (ref 78.0–100.0)
RBC: 4.25 Mil/uL (ref 4.22–5.81)
WBC: 6.5 10*3/uL (ref 4.5–10.5)

## 2012-10-20 LAB — COMPREHENSIVE METABOLIC PANEL
ALT: 10 U/L (ref 0–53)
Albumin: 4.4 g/dL (ref 3.5–5.2)
Alkaline Phosphatase: 113 U/L (ref 39–117)
Glucose, Bld: 93 mg/dL (ref 70–99)
Potassium: 4.4 mEq/L (ref 3.5–5.1)
Sodium: 137 mEq/L (ref 135–145)
Total Protein: 8.2 g/dL (ref 6.0–8.3)

## 2012-10-20 MED ORDER — HYDROCODONE-ACETAMINOPHEN 7.5-325 MG PO TABS: 1.0000 | ORAL_TABLET | ORAL | Status: DC | PRN

## 2012-10-20 NOTE — Patient Instructions (Addendum)
You have been given a separate informational sheet regarding your tobacco use, the importance of quitting and local resources to help you quit.   You have been referred to Pulmonary located in the [facility] @ [address] [place] [address] you have an appointment on 10/28/2012 @ 11:15; Please arrive 15 minutes prior to you appointment.  You have been referred to [facility] located in [place] @ [address] [place], [place] Your appointment is in 12/14/2012 ! 1:00pm [phone]   Your physician has requested that you go to the basement for the following lab work before leaving today: CBC CMP    You have been scheduled for a CT scan of the abdomen and pelvis at [facility] ([address]---this is in the same building as Architectural technologist).   You are scheduled on 10/26/2012 at 1:00. You should arrive 15 minutes prior to your appointment time for registration. Please follow the written instructions below on the day of your exam:  WARNING: IF YOU ARE ALLERGIC TO IODINE/X-RAY DYE, PLEASE NOTIFY RADIOLOGY IMMEDIATELY AT [phone]! YOU WILL BE GIVEN A 13 HOUR PREMEDICATION PREP.  1) Do not eat or drink anything after 9:00am (4 hours prior to your test)   You may take any medications as prescribed with a small amount of water except for the following: Metformin, Glucophage, Glucovance, Avandamet, Riomet, Fortamet, Actoplus Met, Janumet, Glumetza or Metaglip. The above medications must be held the day of the exam AND 48 hours after the exam.  The purpose of you drinking the oral contrast is to aid in the visualization of your intestinal tract. The contrast solution may cause some diarrhea. Before your exam is started, you will be given a small amount of fluid to drink. Depending on your individual set of symptoms, you may also receive an intravenous injection of x-ray contrast/dye. Plan on being at [facility] for 30 minutes or long, depending on the type of  exam you are having performed.  If you have any questions regarding your exam or if you need to reschedule, you may call the CT department at [phone] between the hours of 8:00 am and 5:00 pm, Monday-Friday.  ________________________________________________________________________

## 2012-10-20 NOTE — Progress Notes (Signed)
Subjective:    Patient ID: [name] [name], male    DOB: 1945-07-07, 66 y.o.   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 He was initially seen at request his PCP to evaluate anemia and for colon cancer screening. He had a colonoscopy performed on 01/07/2012 which revealed 5 sessile polyps less than 4 mm in size, mild diverticulosis and internal hemorrhoids. Pathology results revealed adenomatous and hyperplastic polyps. Repeat colonoscopy was recommended in 5 years. Abdominal ultrasound was also performed after that appointment was normal and negative for gallstones. Today he reports 2-3 weeks ago he developed left upper quadrant abdominal pain which was severe at times. This has improved significantly, though he is not sure why. When he was occurring it tended to be worse with lying down. At that time he was having a decreased appetite and mild nausea. He reports his appetite has returned to normal and he is able to eat full meals. He denies heartburn, dysphagia or odynophagia. No abdominal pain today. He reports bowel movements are normal for him which is once daily to once every other day. He denies blood in his stool or melena. Regarding alcohol use he reports he is occasionally drinking "a beer every now and then".  He states this is "uncommon".  He states he has lost his primary care provider do to changes in his Medicare and Medicaid. He is requesting a new PCP and also requesting refill of his chronic narcotics.  Review of Systems As per history of present illness, otherwise negative  Current Medications, Allergies, Past Medical History, Past Surgical History, Family History and Social History were reviewed in [employer] record.     Objective:   Physical Exam BP 140/70  Pulse 72  Ht 5' 6.14" (1.68 m)  Wt 127 lb (57.607 kg)  BMI 20.41 kg/m2 Constitutional: Well-developed and thin male in no acute distress  HEENT: Normocephalic and atraumatic. Oropharynx is clear and moist. No oropharyngeal exudate. Conjunctivae are normal.  No scleral icterus. Cardiovascular: Normal rate, regular rhythm and intact distal pulses. Pulmonary/chest: Distant breath sounds with no wheezing rales or rhonchi Abdominal: Soft, thin, nontender, nondistended. Bowel sounds active throughout.  Extremities: no clubbing, cyanosis, or edema Neurological: Alert and oriented to person place and time. Skin: Skin is warm and dry. No rashes noted. Psychiatric: Normal mood and affect. Behavior is normal.  COMPLETE ABDOMINAL ULTRASOUND -- Nov 2013   Comparison:  Abdomen and pelvis CT, 03/02/2006   Findings:   Gallbladder:  No gallstones, gallbladder wall thickening, or pericholecystic fluid.   Common bile duct:  Normal in caliber with no stones measuring 4.1 mm.   Liver:  No focal lesion identified.  Within normal limits in parenchymal echogenicity.   IVC:  Appears normal.   Pancreas:  The pancreas was obscured by overlying midline bowel gas.   Spleen:  Normal measuring 5 cm   Right Kidney:  Normal measuring 9.7 cm   Left Kidney:  Normal measuring 10 cm   Abdominal aorta:  No aneurysm identified.   IMPRESSION: Normal abdominal ultrasound.  The pancreas was not visualized due to overlying bowel gas.      Assessment & Plan:  67 year old male with a past medical history of chronic pancreatitis secondary to alcohol abuse, alcohol abuse  now in remission, peripheral vascular disease, hypertension, lung cancer status post lobectomy felt currently in remission, and GERD who is seen in followup  1.  LUQ abd pain, now resolved -- the differential for his left upper quadrant pain includes recurrent acute on chronic  pancreatitis. Also he would be at risk for ulcer disease given his chronic use of meloxicam. It is reassuring the pain is improved on its own. I have recommended CT scan of the abdomen and pelvis to evaluate his pancreas, specifically in light of chronic calcific pancreatitis. I will like to check labs today to include CBC and CMP. If his CT is unremarkable, and the pain returns I would recommend daily PPI, in light of his chronic NSAID use. He will let me know if the pain returns.  2.  History of adenomatous colon polyps -- we reviewed his colonoscopy from last year and he is aware of the recommended 5 year surveillance interval.  3.  Alcohol use -- I have recommended strict abstinence given his history of chronic calcific pancreatitis.  4.  History of COPD and lung cancer -- pulmonary referral  5.  Primary care -- referral to [facility] [place] [facility] as he reports losing his primary care due to insurance reasons.  I will refill his chronic hydrocodone prescription at current dose and #, while he is awaiting a primary care appointment

## 2012-10-21 ENCOUNTER — Telehealth: Payer: Self-pay | Admitting: *Deleted

## 2012-10-21 MED ORDER — PREDNISONE 50 MG PO TABS: ORAL_TABLET | ORAL | Status: DC

## 2012-10-21 NOTE — Telephone Encounter (Signed)
Pt will have a Pancreatic Protocol CT scan and he is allergic to contrast dye. Ordered Prednisone with specific instructions on bottle. Pt stated understanding.

## 2012-10-26 ENCOUNTER — Ambulatory Visit (INDEPENDENT_AMBULATORY_CARE_PROVIDER_SITE_OTHER)
Admission: RE | Admit: 2012-10-26 | Discharge: 2012-10-26 | Disposition: A | Discharge status: Home / Self Care | Payer: Medicare Other | Source: Ambulatory Visit | Attending: Internal Medicine | Admitting: Internal Medicine

## 2012-10-26 ENCOUNTER — Other Ambulatory Visit: Payer: Self-pay | Admitting: Internal Medicine

## 2012-10-26 DIAGNOSIS — K861 Other chronic pancreatitis: Secondary | ICD-10-CM

## 2012-10-26 DIAGNOSIS — Z85118 Personal history of other malignant neoplasm of bronchus and lung: Secondary | ICD-10-CM

## 2012-10-26 DIAGNOSIS — J449 Chronic obstructive pulmonary disease, unspecified: Secondary | ICD-10-CM

## 2012-10-26 MED ORDER — IOHEXOL 350 MG/ML SOLN
100.0000 mL | Freq: Once | INTRAVENOUS | Status: AC | PRN
  Administered 2012-10-26: 100 mL via INTRAVENOUS

## 2012-10-28 ENCOUNTER — Ambulatory Visit (INDEPENDENT_AMBULATORY_CARE_PROVIDER_SITE_OTHER): Payer: Medicare Other | Admitting: Internal Medicine

## 2012-10-28 ENCOUNTER — Encounter: Payer: Self-pay | Admitting: Internal Medicine

## 2012-10-28 VITALS — BP 126/80 | HR 63 | Temp 98.1°F | Ht 67.0 in | Wt 127.4 lb

## 2012-10-28 DIAGNOSIS — Z85118 Personal history of other malignant neoplasm of bronchus and lung: Secondary | ICD-10-CM

## 2012-10-28 DIAGNOSIS — J449 Chronic obstructive pulmonary disease, unspecified: Secondary | ICD-10-CM

## 2012-10-28 DIAGNOSIS — F17200 Nicotine dependence, unspecified, uncomplicated: Secondary | ICD-10-CM

## 2012-10-28 NOTE — Progress Notes (Signed)
  Subjective:    Patient ID: [name] [name], male    DOB: 01-Mar-1945   MRN: [id number]  HPI   38 yobm active smoker sp vats RULobectromy for [name] [name] and no adjuvant therapy with onset of doe about the same time of his surgery with increased need for albuterol starting after surgery referred 10/28/2012  by [name] [name] (whom  he saw for chronic pancreatitis)  for copd eval  10/28/2012 1st [name] [name] office visit/ [name] [name] still smoking cc doe x 5 years slow progressive variably across the room and on a good day walk flat like at the mall but no hills/ hurry even on best days, assoc with some nasal congestion but no purulent secretions     No obvious pattern in day to day or daytime variabilty or assoc chronic cough or cp or chest tightness, subjective wheeze overt sinus or hb symptoms. No unusual exp hx or h/o childhood pna/ asthma or knowledge of premature birth.  Sleeping ok without nocturnal  or early am exacerbation  of respiratory  c/o's or need for noct saba. Also denies any obvious fluctuation of symptoms with weather or environmental changes or other aggravating or alleviating factors except as outlined above   Current Medications, Allergies, Complete Past Medical History, Past Surgical History, Family History, and Social History were reviewed in [name] [name].            Review of Systems  Constitutional: Negative for fever, chills, activity change, appetite change and unexpected weight change.  HENT: Positive for congestion, rhinorrhea and postnasal drip. Negative for sore throat, sneezing, trouble swallowing, dental problem and voice change.   Eyes: Negative for visual disturbance.  Respiratory: Positive for shortness of breath. Negative for cough and choking.   Cardiovascular: Positive for leg swelling. Negative for chest pain.  Gastrointestinal: Negative for nausea, vomiting and abdominal pain.  Genitourinary: Negative for difficulty urinating.   Musculoskeletal: Positive for arthralgias.  Skin: Negative for rash.  Psychiatric/Behavioral: Negative for behavioral problems and confusion.       Objective:   Physical Exam  Wt Readings from Last 3 Encounters:  10/28/12 127 lb 6.4 oz (57.788 kg)  10/20/12 127 lb (57.607 kg)  10/16/12 127 lb (57.607 kg)     amb bm nad  With unusual affect, very limited insight into medical care  HEENT mild turbinate edema.  Oropharynx no thrush or excess pnd or cobblestoning.  No JVD or cervical adenopathy. Mild accessory muscle hypertrophy. Trachea midline, nl thryroid. Chest was hyperinflated by percussion with diminished breath sounds and moderate increased exp time without wheeze. Hoover sign positive at mid inspiration. Regular rate and rhythm without murmur gallop or rub or increase P2 or edema.  Abd: no hsm, nl excursion. Ext warm without cyanosis or clubbing.      cxr  08/06/12 Postsurgical changes are noted right hemithorax and right hilum.  No acute infiltrate or pleural effusion. No [name] edema     Assessment & Plan:

## 2012-10-28 NOTE — Patient Instructions (Addendum)
Only use your albuterol (proaire)as a rescue medication to be used if you can't catch your breath by resting or doing a relaxed purse lip breathing pattern.  - The less you use it, the better it will work when you need it. - Ok to use up to every 4 hours if you must but call for immediate appointment if use goes up over your usual need - Don't leave home without it !!  (think of it like your spare tire for your car)     The key is to stop smoking completely before smoking completely stops you!   Please schedule a follow up office visit in 6 weeks, call sooner if needed with pfts

## 2012-10-29 DIAGNOSIS — F17200 Nicotine dependence, unspecified, uncomplicated: Secondary | ICD-10-CM | POA: Insufficient documentation

## 2012-10-29 DIAGNOSIS — C3412 Malignant neoplasm of upper lobe, left bronchus or lung: Secondary | ICD-10-CM | POA: Insufficient documentation

## 2012-10-29 NOTE — Assessment & Plan Note (Signed)
DDX of  difficult airways managment all start with A and  include Adherence, Ace Inhibitors, Acid Reflux, Active Sinus Disease, Alpha 1 Antitripsin deficiency, Anxiety masquerading as Airways dz,  ABPA,  allergy(esp in young), Aspiration (esp in elderly), Adverse effects of DPI,  Active smokers, plus two Bs  = Bronchiectasis and Beta blocker use..and one C= CHF  Adherence is always the initial "prime suspect" and is a multilayered concern that requires a "trust but verify" approach in every patient - starting with knowing how to use medications, especially inhalers, correctly, keeping up with refills and understanding the fundamental difference between maintenance and prns vs those medications only taken for a very short course and then stopped and not refilled.   Active smoking is the greatest concern here, needs to focus on quitting and return for pfts

## 2012-10-29 NOTE — Assessment & Plan Note (Signed)
I reviewed the Flethcher curve with patient that basically indicates  if you quit smoking when your best day FEV1 is still well preserved (which his must be because on best days is only limited on exertion above a flat walk at nl pace)  it is highly unlikely you will progress to severe disease and informed the patient there was no medication on the market that has proven to change the curve or the likelihood of progression.  Therefore stopping smoking and maintaining abstinence is the most important aspect of care, not choice of inhalers or for that matter, doctors.

## 2012-10-29 NOTE — Assessment & Plan Note (Signed)
Sp RULobectomy NSC 2008 s adjuvant rx/ [facility]   No evidence of recurrence to date but is at risk of new primary as still smoking

## 2012-12-09 ENCOUNTER — Ambulatory Visit (INDEPENDENT_AMBULATORY_CARE_PROVIDER_SITE_OTHER): Payer: Medicare Other | Admitting: Internal Medicine

## 2012-12-09 ENCOUNTER — Encounter: Payer: Self-pay | Admitting: Internal Medicine

## 2012-12-09 VITALS — BP 134/84 | HR 71 | Temp 98.3°F | Ht 66.5 in | Wt 126.0 lb

## 2012-12-09 DIAGNOSIS — J45909 Unspecified asthma, uncomplicated: Secondary | ICD-10-CM

## 2012-12-09 DIAGNOSIS — F17200 Nicotine dependence, unspecified, uncomplicated: Secondary | ICD-10-CM

## 2012-12-09 DIAGNOSIS — J449 Chronic obstructive pulmonary disease, unspecified: Secondary | ICD-10-CM

## 2012-12-09 LAB — PULMONARY FUNCTION TEST

## 2012-12-09 NOTE — Patient Instructions (Signed)
Only use your albuterol (proaire) as a rescue medication to be used if you can't catch your breath by resting or doing a relaxed purse lip breathing pattern.  - The less you use it, the better it will work when you need it. - Ok to use up to every 4 hours if you must but call for immediate appointment if use goes up over your usual need - Don't leave home without it !!  (think of it like your spare tire for your car)     The key is to stop smoking completely before smoking completely stops you!   Pulmonary follow up is as needed

## 2012-12-09 NOTE — Assessment & Plan Note (Signed)

## 2012-12-09 NOTE — Progress Notes (Signed)
Subjective:    Patient ID: [name] [name], male    DOB: [birth year]   MRN: [id number]    Brief patient profile:  67 yobm active smoker sp vats RULobectromy for [name] [name] 2008  and no adjuvant therapy with onset of doe about the same time of his surgery with increased need for albuterol starting after surgery referred to pulmonary  10/28/2012  by Dr [name] [name] (whom  he saw for chronic pancreatitis)  for copd eval  10/28/2012 1st [name] [name] Pulmonary office visit/ [name] [name] still smoking cc doe x 5 years slow progressive variably across the room and on a good day walk flat like at the mall but no hills/ hurry even on best days, assoc with some nasal congestion but no purulent secretions rec Only use your albuterol (proaire)as a rescue medication to be used if you can't catch your breath by resting or doing a relaxed purse lip breathing pattern.  - The less you use it, the better it will work when you need it. - Ok to use up to every 4 hours if you must but call for immediate appointment if use goes up over your usual need - Don't leave home without it !!  (think of it like your spare tire for your car)    The key is to stop smoking completely before smoking completely stops you!  Please schedule a follow up office visit in 6 weeks, call sooner if needed with pfts    12/09/2012 f/u ov/[name] [name] re: copd vs asthma  Chief Complaint  Patient presents with  . Followup with PFT    Pt states that his SOB is unchanged- still using proair qid whether he needs it or not.    sometimes sob "just standing up", sometimes not unless goes up hills Not typically having any  noct spells Comes into office s am saba (none x 12 h) with nl pfts but feeling his usual symptoms s it and says would have already used it if we had not asked him to hold off.  No obvious pattern in day to day or daytime variabilty or assoc chronic cough or cp or chest tightness, subjective wheeze overt sinus or hb symptoms. No unusual exp hx or h/o childhood  pna/ asthma or knowledge of premature birth.  Sleeping ok without nocturnal  or early am exacerbation  of respiratory  c/o's or need for noct saba. Also denies any obvious fluctuation of symptoms with weather or environmental changes or other aggravating or alleviating factors except as outlined above   Current Medications, Allergies, Complete Past Medical History, Past Surgical History, Family History, and Social History were reviewed in [name] [name] record.  ROS  The following are not active complaints unless bolded sore throat, dysphagia, dental problems, itching, sneezing,  nasal congestion or excess/ purulent secretions, ear ache,   fever, chills, sweats, unintended wt loss, pleuritic or exertional cp, hemoptysis,  orthopnea pnd or leg swelling, presyncope, palpitations, heartburn, abdominal pain, anorexia, nausea, vomiting, diarrhea  or change in bowel or urinary habits, change in stools or urine, dysuria,hematuria,  rash, arthralgias, visual complaints, headache, numbness weakness or ataxia or problems with walking or coordination,  change in mood/affect or memory.                        Objective:   Physical Exam  Wt Readings from Last 3 Encounters:  12/09/12 126 lb (57.153 kg)  10/28/12 127 lb 6.4 oz (57.788 kg)  10/20/12 127 lb (57.607  kg)        amb bm nad  With unusual affect, very limited insight into medical care  HEENT: nl dentition, turbinates, and orophanx. Nl external ear canals without cough reflex   NECK :  without JVD/Nodes/TM/ nl carotid upstrokes bilaterally   LUNGS: no acc muscle use, clear to A and P bilaterally without cough on insp or exp maneuvers   CV:  RRR  no s3 or murmur or increase in P2, no edema   ABD:  soft and nontender with nl excursion in the supine position. No bruits or organomegaly, bowel sounds nl  MS:  warm without deformities, calf tenderness, cyanosis or clubbing  SKIN: warm and dry without lesions     NEURO:  alert, approp, no deficits        cxr  08/06/12 Postsurgical changes are noted right hemithorax and right hilum.  No acute infiltrate or pleural effusion. No pulmonary edema     Assessment & Plan:

## 2012-12-09 NOTE — Progress Notes (Signed)
PFT done today. 

## 2012-12-09 NOTE — Assessment & Plan Note (Signed)
-   PFT's wnl x dlco 51% 12/09/2012   DDX of  difficult airways managment all start with A and  include Adherence, Ace Inhibitors, Acid Reflux, Active Sinus Disease, Alpha 1 Antitripsin deficiency, Anxiety masquerading as Airways dz,  ABPA,  allergy(esp in young), Aspiration (esp in elderly), Adverse effects of DPI,  Active smokers, plus two Bs  = Bronchiectasis and Beta blocker use..and one C= CHF  Active smoking greatest concern > discussed separately  ? Anxiety > typically a dx of exclusion but absence of symptoms while sleeping or early in am with lack of objective evidence of significant obstruction strongly favors this as a possible dx.   For now ok to just use approp prn saba and f/u here prn   Could contemplate a trial of symbicort or dulera but he shows such poor insight into meds I am reluctant to consider this

## 2012-12-14 ENCOUNTER — Ambulatory Visit (INDEPENDENT_AMBULATORY_CARE_PROVIDER_SITE_OTHER): Payer: Medicare Other | Admitting: Family Medicine

## 2012-12-14 ENCOUNTER — Telehealth: Payer: Self-pay | Admitting: *Deleted

## 2012-12-14 ENCOUNTER — Encounter: Payer: Self-pay | Admitting: Family Medicine

## 2012-12-14 VITALS — BP 132/80 | HR 67 | Temp 98.7°F | Ht 67.0 in | Wt 126.8 lb

## 2012-12-14 DIAGNOSIS — Z125 Encounter for screening for malignant neoplasm of prostate: Secondary | ICD-10-CM

## 2012-12-14 DIAGNOSIS — Z23 Encounter for immunization: Secondary | ICD-10-CM

## 2012-12-14 DIAGNOSIS — I7409 Other arterial embolism and thrombosis of abdominal aorta: Secondary | ICD-10-CM

## 2012-12-14 DIAGNOSIS — J449 Chronic obstructive pulmonary disease, unspecified: Secondary | ICD-10-CM

## 2012-12-14 DIAGNOSIS — G4700 Insomnia, unspecified: Secondary | ICD-10-CM

## 2012-12-14 DIAGNOSIS — Z0000 Encounter for general adult medical examination without abnormal findings: Secondary | ICD-10-CM

## 2012-12-14 DIAGNOSIS — I10 Essential (primary) hypertension: Secondary | ICD-10-CM

## 2012-12-14 DIAGNOSIS — I739 Peripheral vascular disease, unspecified: Secondary | ICD-10-CM

## 2012-12-14 DIAGNOSIS — F17200 Nicotine dependence, unspecified, uncomplicated: Secondary | ICD-10-CM

## 2012-12-14 DIAGNOSIS — R0989 Other specified symptoms and signs involving the circulatory and respiratory systems: Secondary | ICD-10-CM

## 2012-12-14 DIAGNOSIS — R195 Other fecal abnormalities: Secondary | ICD-10-CM

## 2012-12-14 DIAGNOSIS — Z1211 Encounter for screening for malignant neoplasm of colon: Secondary | ICD-10-CM

## 2012-12-14 LAB — HEMOCCULT GUIAC POC 1CARD (OFFICE): Fecal Occult Blood, POC: POSITIVE

## 2012-12-14 LAB — POCT URINALYSIS DIPSTICK
Blood, UA: NEGATIVE
Glucose, UA: NEGATIVE
Ketones, UA: NEGATIVE
Spec Grav, UA: 1.01
Urobilinogen, UA: 0.2

## 2012-12-14 MED ORDER — HYDROCHLOROTHIAZIDE 12.5 MG PO CAPS: 12.5000 mg | ORAL_CAPSULE | Freq: Every day | ORAL | Status: DC

## 2012-12-14 MED ORDER — AMLODIPINE BESYLATE 10 MG PO TABS: 10.0000 mg | ORAL_TABLET | Freq: Every day | ORAL | Status: DC

## 2012-12-14 MED ORDER — AMITRIPTYLINE HCL 50 MG PO TABS
50.0000 mg | ORAL_TABLET | Freq: Every day | ORAL | Status: DC
Start: 2012-12-14 — End: 2014-01-13

## 2012-12-14 NOTE — Assessment & Plan Note (Signed)
Per cards and vascular

## 2012-12-14 NOTE — Progress Notes (Signed)
Subjective:    [name] [name] is a 67 y.o. male who presents for Medicare Annual/Subsequent preventive examination.   Preventive Screening-Counseling & Management  Tobacco History  Smoking status  . Current Some Day Smoker -- 0.10 packs/day for 55 years  . Types: Cigarettes  Smokeless tobacco  . Never Used    Comment:      Problems Prior to Visit 1. none  Current Problems (verified) Patient Active Problem List   Diagnosis Date Noted  . Smoker 10/29/2012  . History of lung cancer 10/29/2012  . Pre-operative cardiovascular examination 08/21/2012  . PVD (peripheral vascular disease) 08/21/2012  . Pain in limb 06/05/2012  . Peripheral vascular disease, unspecified 03/20/2012  . Atherosclerosis of native arteries of the extremities with intermittent claudication 03/20/2012  . 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 Aorto-iliac disease 09/18/2011  . Hypertension 12/05/2010  . Peripheral vascular disease 12/05/2010  . Glaucoma   . Asthma, intrinsic   . Ulcer   . H/O alcohol abuse     Medications Prior to Visit Current Outpatient Prescriptions on File Prior to Visit  Medication Sig Dispense Refill  . aspirin 81 MG EC tablet Take 1 tablet (81 mg total) by mouth daily.  30 tablet  0  . HYDROcodone-acetaminophen (NORCO) 7.5-325 MG per tablet Take 1-2 tablets by mouth every 4 (four) hours as needed.  60 tablet  0  . latanoprost (XALATAN) 0.005 % ophthalmic solution Place 1 drop into both eyes daily.      . nicotine polacrilex (NICORETTE) 4 MG gum Take 4 mg by mouth 4 (four) times daily as needed for smoking cessation.       . nitroGLYCERIN (NITROSTAT) 0.4 MG SL tablet Place 0.4 mg under the tongue every 5 (five) minutes as needed for chest pain.       Marland Kitchen PROAIR HFA 108 (90 BASE) MCG/ACT inhaler Inhale 2 puffs into the lungs every 4 (four) hours as needed for wheezing or shortness of breath.        No current facility-administered medications on  file prior to visit.    Current Medications (verified) Current Outpatient Prescriptions  Medication Sig Dispense Refill  . amitriptyline (ELAVIL) 50 MG tablet Take 1 tablet (50 mg total) by mouth daily.  30 tablet  5  . amLODipine (NORVASC) 10 MG tablet Take 1 tablet (10 mg total) by mouth daily.  30 tablet  5  . aspirin 81 MG EC tablet Take 1 tablet (81 mg total) by mouth daily.  30 tablet  0  . hydrochlorothiazide (MICROZIDE) 12.5 MG capsule Take 1 capsule (12.5 mg total) by mouth daily.  30 capsule  5  . HYDROcodone-acetaminophen (NORCO) 7.5-325 MG per tablet Take 1-2 tablets by mouth every 4 (four) hours as needed.  60 tablet  0  . latanoprost (XALATAN) 0.005 % ophthalmic solution Place 1 drop into both eyes daily.      . nicotine polacrilex (NICORETTE) 4 MG gum Take 4 mg by mouth 4 (four) times daily as needed for smoking cessation.       . nitroGLYCERIN (NITROSTAT) 0.4 MG SL tablet Place 0.4 mg under the tongue every 5 (five) minutes as needed for chest pain.       Marland Kitchen PROAIR HFA 108 (90 BASE) MCG/ACT inhaler Inhale 2 puffs into the lungs every 4 (four) hours as needed for wheezing or shortness of breath.        No current facility-administered medications for this visit.  Allergies (verified) Morphine and related; Hydromorphone hcl; Ibuprofen; Omnipaque; and Shellfish allergy   PAST HISTORY  Family History Family History  Problem Relation Age of Onset  . Alcohol abuse Father   . Cancer Brother     Social History History  Substance Use Topics  . Smoking status: Current Some Day Smoker -- 0.10 packs/day for 55 years    Types: Cigarettes  . Smokeless tobacco: Never Used     Comment:    . Alcohol Use: Yes     Comment: previous history of heavy alcohol use    Are there smokers in your home (other than you)?  No  Risk Factors Current exercise habits: walking  Dietary issues discussed: na   Cardiac risk factors: advanced age (older than 50 for men, 1 for women),  hypertension, male gender and smoking/ tobacco exposure.  Depression Screen (Note: if answer to either of the following is "Yes", a more complete depression screening is indicated)   Q1: Over the past two weeks, have you felt down, depressed or hopeless? No  Q2: Over the past two weeks, have you felt little interest or pleasure in doing things? No  Have you lost interest or pleasure in daily life? No  Do you often feel hopeless? No  Do you cry easily over simple problems? No  Activities of Daily Living In your present state of health, do you have any difficulty performing the following activities?:  Driving? Yes--- his neighbors and kids drive him around Managing money?  No Feeding yourself? No Getting from bed to chair? No Climbing a flight of stairs? No Preparing food and eating?: No Bathing or showering? No Getting dressed: No Getting to the toilet? No Using the toilet:No Moving around from place to place: No In the past year have you fallen or had a near fall?:No   Are you sexually active?  No  Do you have more than one partner?  No  Hearing Difficulties: No Do you often ask people to speak up or repeat themselves? No Do you experience ringing or noises in your ears? No Do you have difficulty understanding soft or whispered voices? No   Do you feel that you have a problem with memory? No  Do you often misplace items? No  Do you feel safe at home?  Yes  Cognitive Testing  Alert? Yes  Normal Appearance?Yes  Oriented to person? Yes  Place? Yes   Time? Yes  Recall of three objects?  Yes  Can perform simple calculations? Yes  Displays appropriate judgment?Yes  Can read the correct time from a watch face?Yes   Advanced Directives have been discussed with the patient? Yes   List the Names of Other Physician/Practitioners you currently use: 1.    Indicate any recent Medical Services you may have received from other than [facility] providers in the past year (date may be  approximate).   There is no immunization history on file for this patient.  Screening Tests Health Maintenance  Topic Date Due  . Tetanus/tdap  11/09/1966  . Zostavax  11/09/2007  . Pneumococcal Polysaccharide Vaccine Age 6 And Over  11/08/2012  . Influenza Vaccine  12/14/2013  . Colonoscopy  01/06/2017    All answers were reviewed with the patient and necessary referrals were made:  Loreen Freud, DO   12/14/2012   History reviewed:  He  has a past medical history of Peripheral vascular disease; Pancreatitis; Hypertension; Glaucoma; COPD (chronic obstructive pulmonary disease); Ulcer; H/O alcohol abuse; History of tobacco  abuse; PONV (postoperative nausea and vomiting); Anemia; GERD (gastroesophageal reflux disease); Carpal tunnel syndrome, bilateral; and Colon polyps (12/2011). He  does not have any pertinent problems on file. He  has past surgical history that includes bilateral common iliac and external iliac angioplasty and stenting; Hernia repair; Anterior cervical decomp/discectomy fusion; Chalazion excision (03/13/2011); Eye surgery (2013); Lung surgery; Ankle arthroplasty; Vascular surgery; Foot surgery; Cardiac catheterization; Femoral-popliteal Bypass Graft (Left, 09/09/2012); Endarterectomy femoral (Left, 09/09/2012); and Patch angioplasty (Left, 09/09/2012). His family history includes Alcohol abuse in his father; Cancer in his brother. He  reports that he has been smoking Cigarettes.  He has a 5.5 pack-year smoking history. He has never used smokeless tobacco. He reports that he drinks alcohol. He reports that he uses illicit drugs (Marijuana). He has a current medication list which includes the following prescription(s): amitriptyline, amlodipine, aspirin, hydrochlorothiazide, hydrocodone-acetaminophen, latanoprost, nicotine polacrilex, nitroglycerin, and proair hfa. Current Outpatient Prescriptions on File Prior to Visit  Medication Sig Dispense Refill  . aspirin 81 MG EC  tablet Take 1 tablet (81 mg total) by mouth daily.  30 tablet  0  . HYDROcodone-acetaminophen (NORCO) 7.5-325 MG per tablet Take 1-2 tablets by mouth every 4 (four) hours as needed.  60 tablet  0  . latanoprost (XALATAN) 0.005 % ophthalmic solution Place 1 drop into both eyes daily.      . nicotine polacrilex (NICORETTE) 4 MG gum Take 4 mg by mouth 4 (four) times daily as needed for smoking cessation.       . nitroGLYCERIN (NITROSTAT) 0.4 MG SL tablet Place 0.4 mg under the tongue every 5 (five) minutes as needed for chest pain.       Marland Kitchen PROAIR HFA 108 (90 BASE) MCG/ACT inhaler Inhale 2 puffs into the lungs every 4 (four) hours as needed for wheezing or shortness of breath.        No current facility-administered medications on file prior to visit.   He is allergic to morphine and related; hydromorphone hcl; ibuprofen; omnipaque; and shellfish allergy.  Review of Systems  Review of Systems  Constitutional: Negative for activity change, appetite change and fatigue.  HENT: Negative for hearing loss, congestion, tinnitus and ear discharge.   Eyes: Negative for visual disturbance (see optho q1y -- vision corrected to 20/20 with glasses).  Respiratory: Negative for cough, chest tightness and shortness of breath.   Cardiovascular: Negative for chest pain, palpitations and leg swelling.  Gastrointestinal: Negative for abdominal pain, diarrhea, constipation and abdominal distention.  Genitourinary: Negative for urgency, frequency, decreased urine volume and difficulty urinating.  Musculoskeletal: Negative for back pain, arthralgias and gait problem.  Skin: Negative for color change, pallor and rash.  Neurological: Negative for dizziness, light-headedness, numbness and headaches.  Hematological: Negative for adenopathy. Does not bruise/bleed easily.  Psychiatric/Behavioral: Negative for suicidal ideas, confusion, sleep disturbance, self-injury, dysphoric mood, decreased concentration and agitation.   Pt is able to read and write and can do all ADLs No risk for falling No abuse/ violence in home    Objective:     Vision by Snellen  Blood pressure 132/80, pulse 67, temperature 98.7 F (37.1 C), temperature source Oral, height 5\' 7"  (1.702 m), weight 126 lb 12.8 oz (57.516 kg), SpO2 98.00%. Body mass index is 19.85 kg/(m^2).  BP 132/80  Pulse 67  Temp(Src) 98.7 F (37.1 C) (Oral)  Ht 5\' 7"  (1.702 m)  Wt 126 lb 12.8 oz (57.516 kg)  BMI 19.85 kg/m2  SpO2 98% General appearance: alert, cooperative, appears stated age  and no distress Head: Normocephalic, without obvious abnormality, atraumatic Eyes: negative findings: lids and lashes normal and pupils equal, round, reactive to light and accomodation Ears: normal TM's and external ear canals both ears Nose: Nares normal. Septum midline. Mucosa normal. No drainage or sinus tenderness. Throat: lips, mucosa, and tongue normal; teeth and gums normal Neck: no adenopathy, no carotid bruit, no JVD, supple, symmetrical, trachea midline and thyroid not enlarged, symmetric, no tenderness/mass/nodules Back: symmetric, no curvature. ROM normal. No CVA tenderness. Lungs: clear to auscultation bilaterally Chest wall: no tenderness Heart: regular rate and rhythm, S1, S2 normal, no murmur, click, rub or gallop Abdomen: soft, non-tender; bowel sounds normal; no masses,  no organomegaly Male genitalia: normal Rectal: heme + brown stool Extremities: extremities normal, atraumatic, no cyanosis or edema Pulses: 2+ and symmetric Skin: Skin color, texture, turgor normal. No rashes or lesions Lymph nodes: Cervical, supraclavicular, and axillary nodes normal. Neurologic: Alert and oriented X 3, normal strength and tone. Normal symmetric reflexes. Normal coordination and gait Psych=== no depression, no anxiety      Assessment:     cpe      Plan:     During the course of the visit the patient was educated and counseled about appropriate  screening and preventive services including:    Pneumococcal vaccine   Influenza vaccine  Prostate cancer screening  Colorectal cancer screening  Diabetes screening  Glaucoma screening  Smoking cessation counseling  Advanced directives: has NO advanced directive  - add't info requested. Referral to SW: pt will call hospital  Diet review for nutrition referral? Yes ____  Not Indicated ____x   Patient Instructions (the written plan) was given to the patient.  Medicare Attestation I have personally reviewed: The patient's medical and social history Their use of alcohol, tobacco or illicit drugs Their current medications and supplements The patient's functional ability including ADLs,fall risks, home safety risks, cognitive, and hearing and visual impairment Diet and physical activities Evidence for depression or mood disorders  The patient's weight, height, BMI, and visual acuity have been recorded in the chart.  I have made referrals, counseling, and provided education to the patient based on review of the above and I have provided the patient with a written personalized care plan for preventive services.     Loreen Freud, DO   12/14/2012

## 2012-12-14 NOTE — Patient Instructions (Signed)
 Preventive Care for Adults, Male A healthy lifestyle and preventive care can promote health and wellness. Preventive health guidelines for men include the following key practices:  A routine yearly physical is a good way to check with your caregiver about your health and preventative screening. It is a chance to share any concerns and updates on your health, and to receive a thorough exam.  Visit your dentist for a routine exam and preventative care every 6 months. Brush your teeth twice a day and floss once a day. Good oral hygiene prevents tooth decay and gum disease.  The frequency of eye exams is based on your age, health, family medical history, use of contact lenses, and other factors. Follow your caregiver's recommendations for frequency of eye exams.  Eat a healthy diet. Foods like vegetables, fruits, whole grains, low-fat dairy products, and lean protein foods contain the nutrients you need without too many calories. Decrease your intake of foods high in solid fats, added sugars, and salt. Eat the right amount of calories for you.Get information about a proper diet from your caregiver, if necessary.  Regular physical exercise is one of the most important things you can do for your health. Most adults should get at least 150 minutes of moderate-intensity exercise (any activity that increases your heart rate and causes you to sweat) each week. In addition, most adults need muscle-strengthening exercises on 2 or more days a week.  Maintain a healthy weight. The body mass index (BMI) is a screening tool to identify possible weight problems. It provides an estimate of body fat based on height and weight. Your caregiver can help determine your BMI, and can help you achieve or maintain a healthy weight.For adults 20 years and older:  A BMI below 18.5 is considered underweight.  A BMI of 18.5 to 24.9 is normal.  A BMI of 25 to 29.9 is considered overweight.  A BMI of 30 and above is  considered obese.  Maintain normal blood lipids and cholesterol levels by exercising and minimizing your intake of saturated fat. Eat a balanced diet with plenty of fruit and vegetables. Blood tests for lipids and cholesterol should begin at age 20 and be repeated every 5 years. If your lipid or cholesterol levels are high, you are over 50, or you are a high risk for heart disease, you may need your cholesterol levels checked more frequently.Ongoing high lipid and cholesterol levels should be treated with medicines if diet and exercise are not effective.  If you smoke, find out from your caregiver how to quit. If you do not use tobacco, do not start.  Lung cancer screening is recommended for adults aged 55 80 years who are at high risk for developing lung cancer because of a history of smoking. Yearly low-dose computed tomography (CT) is recommended for people who have at least a 30-pack-year history of smoking and are a current smoker or have quit within the past 15 years. A pack year of smoking is smoking an average of 1 pack of cigarettes a day for 1 year (for example: 1 pack a day for 30 years or 2 packs a day for 15 years). Yearly screening should continue until the smoker has stopped smoking for at least 15 years. Yearly screening should also be stopped for people who develop a health problem that would prevent them from having lung cancer treatment.  If you choose to drink alcohol, do not exceed 2 drinks per day. One drink is considered to be 12   ounces (355 mL) of beer, 5 ounces (148 mL) of wine, or 1.5 ounces (44 mL) of liquor.  Avoid use of street drugs. Do not share needles with anyone. Ask for help if you need support or instructions about stopping the use of drugs.  High blood pressure causes heart disease and increases the risk of stroke. Your blood pressure should be checked at least every 1 to 2 years. Ongoing high blood pressure should be treated with medicines, if weight loss and  exercise are not effective.  If you are 45 to 65 years old, ask your caregiver if you should take aspirin to prevent heart disease.  Diabetes screening involves taking a blood sample to check your fasting blood sugar level. This should be done once every 3 years, after age 45, if you are within normal weight and without risk factors for diabetes. Testing should be considered at a younger age or be carried out more frequently if you are overweight and have at least 1 risk factor for diabetes.  Colorectal cancer can be detected and often prevented. Most routine colorectal cancer screening begins at the age of 50 and continues through age 75. However, your caregiver may recommend screening at an earlier age if you have risk factors for colon cancer. On a yearly basis, your caregiver may provide home test kits to check for hidden blood in the stool. Use of a small camera at the end of a tube, to directly examine the colon (sigmoidoscopy or colonoscopy), can detect the earliest forms of colorectal cancer. Talk to your caregiver about this at age 50, when routine screening begins. Direct examination of the colon should be repeated every 5 to 10 years through age 75, unless early forms of pre-cancerous polyps or small growths are found.  Hepatitis C blood testing is recommended for all people born from 1945 through 1965 and any individual with known risks for hepatitis C.  Practice safe sex. Use condoms and avoid high-risk sexual practices to reduce the spread of sexually transmitted infections (STIs). STIs include gonorrhea, chlamydia, syphilis, trichomonas, herpes, HPV, and human immunodeficiency virus (HIV). Herpes, HIV, and HPV are viral illnesses that have no cure. They can result in disability, cancer, and death.  A one-time screening for abdominal aortic aneurysm (AAA) and surgical repair of large AAAs by sound wave imaging (ultrasonography) is recommended for ages 65 to 75 years who are current or  former smokers.  Healthy men should no longer receive prostate-specific antigen (PSA) blood tests as part of routine cancer screening. Consult with your caregiver about prostate cancer screening.  Testicular cancer screening is not recommended for adult males who have no symptoms. Screening includes self-exam, caregiver exam, and other screening tests. Consult with your caregiver about any symptoms you have or any concerns you have about testicular cancer.  Use sunscreen. Apply sunscreen liberally and repeatedly throughout the day. You should seek shade when your shadow is shorter than you. Protect yourself by wearing long sleeves, pants, a wide-brimmed hat, and sunglasses year round, whenever you are outdoors.  Once a month, do a whole body skin exam, using a mirror to look at the skin on your back. Notify your caregiver of new moles, moles that have irregular borders, moles that are larger than a pencil eraser, or moles that have changed in shape or color.  Stay current with required immunizations.  Influenza vaccine. All adults should be immunized every year.  Tetanus, diphtheria, and acellular pertussis (Td, Tdap) vaccine. An adult who has not   previously received Tdap or who does not know his vaccine status should receive 1 dose of Tdap. This initial dose should be followed by tetanus and diphtheria toxoids (Td) booster doses every 10 years. Adults with an unknown or incomplete history of completing a 3-dose immunization series with Td-containing vaccines should begin or complete a primary immunization series including a Tdap dose. Adults should receive a Td booster every 10 years.  Varicella vaccine. An adult without evidence of immunity to varicella should receive 2 doses or a second dose if he has previously received 1 dose.  Human papillomavirus (HPV) vaccine. Males aged 13 21 years who have not received the vaccine previously should receive the 3-dose series. Males aged 22 26 years may be  immunized. Immunization is recommended through the age of 26 years for any male who has sex with males and did not get any or all doses earlier. Immunization is recommended for any person with an immunocompromised condition through the age of 26 years if he did not get any or all doses earlier. During the 3-dose series, the second dose should be obtained 4 8 weeks after the first dose. The third dose should be obtained 24 weeks after the first dose and 16 weeks after the second dose.  Zoster vaccine. One dose is recommended for adults aged 60 years or older unless certain conditions are present.  Measles, mumps, and rubella (MMR) vaccine. Adults born before 1957 generally are considered immune to measles and mumps. Adults born in 1957 or later should have 1 or more doses of MMR vaccine unless there is a contraindication to the vaccine or there is laboratory evidence of immunity to each of the three diseases. A routine second dose of MMR vaccine should be obtained at least 28 days after the first dose for students attending postsecondary schools, health care workers, or international travelers. People who received inactivated measles vaccine or an unknown type of measles vaccine during 1963 1967 should receive 2 doses of MMR vaccine. People who received inactivated mumps vaccine or an unknown type of mumps vaccine before 1979 and are at high risk for mumps infection should consider immunization with 2 doses of MMR vaccine. Unvaccinated health care workers born before 1957 who lack laboratory evidence of measles, mumps, or rubella immunity or laboratory confirmation of disease should consider measles and mumps immunization with 2 doses of MMR vaccine or rubella immunization with 1 dose of MMR vaccine.  Pneumococcal 13-valent conjugate (PCV13) vaccine. When indicated, a person who is uncertain of his immunization history and has no record of immunization should receive the PCV13 vaccine. An adult aged 19 years or  older who has certain medical conditions and has not been previously immunized should receive 1 dose of PCV13 vaccine. This PCV13 should be followed with a dose of pneumococcal polysaccharide (PPSV23) vaccine. The PPSV23 vaccine dose should be obtained at least 8 weeks after the dose of PCV13 vaccine. An adult aged 19 years or older who has certain medical conditions and previously received 1 or more doses of PPSV23 vaccine should receive 1 dose of PCV13. The PCV13 vaccine dose should be obtained 1 or more years after the last PPSV23 vaccine dose.  Pneumococcal polysaccharide (PPSV23) vaccine. When PCV13 is also indicated, PCV13 should be obtained first. All adults aged 65 years and older should be immunized. An adult younger than age 65 years who has certain medical conditions should be immunized. Any person who resides in a nursing home or long-term care facility should be   immunized. An adult smoker should be immunized. People with an immunocompromised condition and certain other conditions should receive both PCV13 and PPSV23 vaccines. People with human immunodeficiency virus (HIV) infection should be immunized as soon as possible after diagnosis. Immunization during chemotherapy or radiation therapy should be avoided. Routine use of PPSV23 vaccine is not recommended for American Indians, [place] Natives, or people younger than 65 years unless there are medical conditions that require PPSV23 vaccine. When indicated, people who have unknown immunization and have no record of immunization should receive PPSV23 vaccine. One-time revaccination 5 years after the first dose of PPSV23 is recommended for people aged 19 64 years who have chronic kidney failure, nephrotic syndrome, asplenia, or immunocompromised conditions. People who received 1 2 doses of PPSV23 before age 65 years should receive another dose of PPSV23 vaccine at age 65 years or later if at least 5 years have passed since the previous dose. Doses of  PPSV23 are not needed for people immunized with PPSV23 at or after age 65 years.  Meningococcal vaccine. Adults with asplenia or persistent complement component deficiencies should receive 2 doses of quadrivalent meningococcal conjugate (MenACWY-D) vaccine. The doses should be obtained at least 2 months apart. Microbiologists working with certain meningococcal bacteria, military recruits, people at risk during an outbreak, and people who travel to or live in countries with a high rate of meningitis should be immunized. A first-year college student up through age 21 years who is living in a residence hall should receive a dose if he did not receive a dose on or after his 16th birthday. Adults who have certain high-risk conditions should receive one or more doses of vaccine.  Hepatitis A vaccine. Adults who wish to be protected from this disease, have certain high-risk conditions, work with hepatitis A-infected animals, work in hepatitis A research labs, or travel to or work in countries with a high rate of hepatitis A should be immunized. Adults who were previously unvaccinated and who anticipate close contact with an international adoptee during the first 60 days after arrival in the [place] from a country with a high rate of hepatitis A should be immunized.  Hepatitis B vaccine. Adults who wish to be protected from this disease, have certain high-risk conditions, may be exposed to blood or other infectious body fluids, are household contacts or sex partners of hepatitis B positive people, are clients or workers in certain care facilities, or travel to or work in countries with a high rate of hepatitis B should be immunized.  Haemophilus influenzae type b (Hib) vaccine. A previously unvaccinated person with asplenia or sickle cell disease or having a scheduled splenectomy should receive 1 dose of Hib vaccine. Regardless of previous immunization, a recipient of a hematopoietic stem cell transplant  should receive a 3-dose series 6 12 months after his successful transplant. Hib vaccine is not recommended for adults with HIV infection. Preventive Service / Frequency Ages 19 to 39  Blood pressure check.** / Every 1 to 2 years.  Lipid and cholesterol check.** / Every 5 years beginning at age 20.  Hepatitis C blood test.** / For any individual with known risks for hepatitis C.  Skin self-exam. / Monthly.  Influenza vaccine. / Every year.  Tetanus, diphtheria, and acellular pertussis (Tdap, Td) vaccine.** / Consult your caregiver. 1 dose of Td every 10 years.  Varicella vaccine.** / Consult your caregiver.  HPV vaccine. / 3 doses over 6 months, if 26 and younger.  Measles, mumps, rubella (MMR) vaccine.** /   You need at least 1 dose of MMR if you were born in 1957 or later. You may also need a 2nd dose.  Pneumococcal 13-valent conjugate (PCV13) vaccine.** / Consult your caregiver.  Pneumococcal polysaccharide (PPSV23) vaccine.** / 1 to 2 doses if you smoke cigarettes or if you have certain conditions.  Meningococcal vaccine.** / 1 dose if you are age 19 to 21 years and a first-year college student living in a residence hall, or have one of several medical conditions, you need to get vaccinated against meningococcal disease. You may also need additional booster doses.  Hepatitis A vaccine.** / Consult your caregiver.  Hepatitis B vaccine.** / Consult your caregiver.  Haemophilus influenzae type b (Hib) vaccine.** / Consult your caregiver. Ages 40 to 64  Blood pressure check.** / Every 1 to 2 years.  Lipid and cholesterol check.** / Every 5 years beginning at age 20.  Lung cancer screening. / Every year if you are aged 55 80 years and have a 30-pack-year history of smoking and currently smoke or have quit within the past 15 years. Yearly screening is stopped once you have quit smoking for at least 15 years or develop a health problem that would prevent you from having lung cancer  treatment.  Fecal occult blood test (FOBT) of stool. / Every year beginning at age 50 and continuing until age 75. You may not have to do this test if you get colonoscopy every 10 years.  Flexible sigmoidoscopy** or colonoscopy.** / Every 5 years for a flexible sigmoidoscopy or every 10 years for a colonoscopy beginning at age 50 and continuing until age 75.  Hepatitis C blood test.** / For all people born from 1945 through 1965 and any individual with known risks for hepatitis C.  Skin self-exam. / Monthly.  Influenza vaccine. / Every year.  Tetanus, diphtheria, and acellular pertussis (Tdap/Td) vaccine.** / Consult your caregiver. 1 dose of Td every 10 years.  Varicella vaccine.** / Consult your caregiver.  Zoster vaccine.** / 1 dose for adults aged 60 years or older.  Measles, mumps, rubella (MMR) vaccine.** / You need at least 1 dose of MMR if you were born in 1957 or later. You may also need a 2nd dose.  Pneumococcal 13-valent conjugate (PCV13) vaccine.** / Consult your caregiver.  Pneumococcal polysaccharide (PPSV23) vaccine.** / 1 to 2 doses if you smoke cigarettes or if you have certain conditions.  Meningococcal vaccine.** / Consult your caregiver.  Hepatitis A vaccine.** / Consult your caregiver.  Hepatitis B vaccine.** / Consult your caregiver.  Haemophilus influenzae type b (Hib) vaccine.** / Consult your caregiver. Ages 65 and over  Blood pressure check.** / Every 1 to 2 years.  Lipid and cholesterol check.**/ Every 5 years beginning at age 20.  Lung cancer screening. / Every year if you are aged 55 80 years and have a 30-pack-year history of smoking and currently smoke or have quit within the past 15 years. Yearly screening is stopped once you have quit smoking for at least 15 years or develop a health problem that would prevent you from having lung cancer treatment.  Fecal occult blood test (FOBT) of stool. / Every year beginning at age 50 and continuing until  age 75. You may not have to do this test if you get colonoscopy every 10 years.  Flexible sigmoidoscopy** or colonoscopy.** / Every 5 years for a flexible sigmoidoscopy or every 10 years for a colonoscopy beginning at age 50 and continuing until age 75.  Hepatitis C blood   test.** / For all people born from 1945 through 1965 and any individual with known risks for hepatitis C.  Abdominal aortic aneurysm (AAA) screening.** / A one-time screening for ages 65 to 75 years who are current or former smokers.  Skin self-exam. / Monthly.  Influenza vaccine. / Every year.  Tetanus, diphtheria, and acellular pertussis (Tdap/Td) vaccine.** / 1 dose of Td every 10 years.  Varicella vaccine.** / Consult your caregiver.  Zoster vaccine.** / 1 dose for adults aged 60 years or older.  Pneumococcal 13-valent conjugate (PCV13) vaccine.** / Consult your caregiver.  Pneumococcal polysaccharide (PPSV23) vaccine.** / 1 dose for all adults aged 65 years and older.  Meningococcal vaccine.** / Consult your caregiver.  Hepatitis A vaccine.** / Consult your caregiver.  Hepatitis B vaccine.** / Consult your caregiver.  Haemophilus influenzae type b (Hib) vaccine.** / Consult your caregiver. **Family history and personal history of risk and conditions may change your caregiver's recommendations. Document Released: 03/12/2001 Document Revised: 05/11/2012 Document Reviewed: 06/11/2010 [employer] Patient Information 2014 [employer].  

## 2012-12-14 NOTE — Telephone Encounter (Signed)
Received a note from Dr Ernst Spell CMA about + hemoccult on exam. Called pt and he denies constipation, but he did have small internal hemorrhoids on COLON 01/07/12. He will see Mike Gip, PA on 12/21/12  Last OV with Dr Rhea Belton 10/20/12 for abdominal pain. Last COLON 01/07/12 with adenomatous polyp, diverticulosis, small internal hemorrhoids; pathe showed adenomas and hyperplastic polyp with a Recall in 5 years.

## 2012-12-14 NOTE — Assessment & Plan Note (Signed)
Check labx Con' tmeds

## 2012-12-14 NOTE — Progress Notes (Signed)
Pre visit review using our clinic review tool, if applicable. No additional management support is needed unless otherwise documented below in the visit note. 

## 2012-12-14 NOTE — Telephone Encounter (Signed)
Message copied by Florene Glen on Mon Dec 14, 2012  2:09 PM ------      Message from: Almeta Monas P      Created: Mon Dec 14, 2012  1:56 PM      Regarding: Blood on examination       Good afternoon            I wanted to make you aware that Mr.Foye came in for a new patient appointment today with Dr.Lowne and when we completed his prostate/rectal exam the patient had blood in his stool. Dr.Lowne wanted to know if you would like to contact the patient for follow up ?                  Thanks       Selena Batten P/CMA ------

## 2012-12-14 NOTE — Assessment & Plan Note (Signed)
con't meds Per pulm

## 2012-12-14 NOTE — Assessment & Plan Note (Signed)
D/w with gI  Pt will be given an appointment

## 2012-12-14 NOTE — Assessment & Plan Note (Signed)
Per vascular  

## 2012-12-14 NOTE — Assessment & Plan Note (Signed)
Pt given sample of nicotrol inhaler to help him to quit smoking

## 2012-12-15 LAB — CBC WITH DIFFERENTIAL/PLATELET
Basophils Absolute: 0.1 10*3/uL (ref 0.0–0.1)
Basophils Relative: 0.9 % (ref 0.0–3.0)
Eosinophils Relative: 5.5 % — ABNORMAL HIGH (ref 0.0–5.0)
HCT: 35.3 % — ABNORMAL LOW (ref 39.0–52.0)
Hemoglobin: 12.1 g/dL — ABNORMAL LOW (ref 13.0–17.0)
Lymphs Abs: 1.4 10*3/uL (ref 0.7–4.0)
MCV: 85 fl (ref 78.0–100.0)
Monocytes Absolute: 0.7 10*3/uL (ref 0.1–1.0)
Monocytes Relative: 9.4 % (ref 3.0–12.0)
Neutro Abs: 4.8 10*3/uL (ref 1.4–7.7)
Platelets: 224 10*3/uL (ref 150.0–400.0)
RDW: 15.8 % — ABNORMAL HIGH (ref 11.5–14.6)
WBC: 7.3 10*3/uL (ref 4.5–10.5)

## 2012-12-15 LAB — HEPATIC FUNCTION PANEL
ALT: 8 U/L (ref 0–53)
Albumin: 4.1 g/dL (ref 3.5–5.2)
Alkaline Phosphatase: 156 U/L — ABNORMAL HIGH (ref 39–117)
Total Bilirubin: 0.7 mg/dL (ref 0.3–1.2)

## 2012-12-15 LAB — BASIC METABOLIC PANEL
BUN: 25 mg/dL — ABNORMAL HIGH (ref 6–23)
Chloride: 102 mEq/L (ref 96–112)
Creatinine, Ser: 1.5 mg/dL (ref 0.4–1.5)
GFR: 59.47 mL/min — ABNORMAL LOW (ref >60.00)
Glucose, Bld: 83 mg/dL (ref 70–99)
Potassium: 4.4 mEq/L (ref 3.5–5.1)
Sodium: 137 mEq/L (ref 135–145)

## 2012-12-15 LAB — LIPID PANEL
Cholesterol: 200 mg/dL (ref 0–200)
LDL Cholesterol: 107 mg/dL — ABNORMAL HIGH (ref 0–99)
Triglycerides: 113 mg/dL (ref 0.0–149.0)

## 2012-12-21 ENCOUNTER — Ambulatory Visit (INDEPENDENT_AMBULATORY_CARE_PROVIDER_SITE_OTHER): Payer: Medicare Other | Admitting: Physician Assistant

## 2012-12-21 ENCOUNTER — Encounter: Payer: Self-pay | Admitting: Physician Assistant

## 2012-12-21 VITALS — BP 138/80 | HR 72 | Ht 67.0 in | Wt 130.0 lb

## 2012-12-21 DIAGNOSIS — Z7901 Long term (current) use of anticoagulants: Secondary | ICD-10-CM

## 2012-12-21 DIAGNOSIS — R195 Other fecal abnormalities: Secondary | ICD-10-CM

## 2012-12-21 NOTE — Progress Notes (Addendum)
Subjective:    Patient ID: [name] [name], male    DOB: 09-Feb-1945, 67 y.o.   MRN: [id number]  HPI  [name] [name] is a 67 year old African American male known to Dr. Rhea Belton who had undergone colonoscopy in December of 2013 and was signed to have 5 polyps all less than 5 mm in size, internal hemorrhoids and moderate diverticulosis. Path from the polyps showed adenomatous and hyperplastic polyps. 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 He is on chronic Plavix and aspirin. He is followed by Dr. Imogene Burn. He also has history of hypertension and lung cancer for which he underwent a lobectomy. Patient had a recent physical and was found to be Hemoccult-positive. Most recent labs done 12/14/2012 show hemoglobin of 12.1 hematocrit of 35 MCV of 85. Labs in September 2014 hemoglobin 12.2 hematocrit of 36.4.  Patient.denies any GI symptoms currently, specifically no melena or hematochezia. He says bowel movements have been normal ,he has no complaints of abdominal pain, his appetite has been fine. There is no dysphagia or diet dysphagia or heartburn. Again he is on a baby aspirin and Plavix- no NSAIDs.    Review of Systems  Constitutional: Negative.   HENT: Negative.   Eyes: Negative.   Respiratory: Negative.   Cardiovascular: Negative.   Gastrointestinal: Negative.   Endocrine: Negative.   Genitourinary: Negative.   Musculoskeletal: Positive for arthralgias and back pain.  Skin: Negative.   Allergic/Immunologic: Negative.   Neurological: Negative.   Hematological: Negative.   Psychiatric/Behavioral: Negative.    Outpatient Prescriptions Prior to Visit  Medication Sig Dispense Refill  . amitriptyline (ELAVIL) 50 MG tablet Take 1 tablet (50 mg total) by mouth daily.  30 tablet  5  . amLODipine (NORVASC) 10 MG tablet Take 1 tablet (10 mg total) by mouth daily.  30 tablet  5  . aspirin 81  MG EC tablet Take 1 tablet (81 mg total) by mouth daily.  30 tablet  0  . hydrochlorothiazide (MICROZIDE) 12.5 MG capsule Take 1 capsule (12.5 mg total) by mouth daily.  30 capsule  5  . HYDROcodone-acetaminophen (NORCO) 7.5-325 MG per tablet Take 1-2 tablets by mouth every 4 (four) hours as needed.  60 tablet  0  . latanoprost (XALATAN) 0.005 % ophthalmic solution Place 1 drop into both eyes daily.      . nitroGLYCERIN (NITROSTAT) 0.4 MG SL tablet Place 0.4 mg under the tongue every 5 (five) minutes as needed for chest pain.       Marland Kitchen PROAIR HFA 108 (90 BASE) MCG/ACT inhaler Inhale 2 puffs into the lungs every 4 (four) hours as needed for wheezing or shortness of breath.       . nicotine polacrilex (NICORETTE) 4 MG gum Take 4 mg by mouth 4 (four) times daily as needed for smoking cessation.        No facility-administered medications prior to visit.   Allergies  Allergen Reactions  . Morphine And Related Itching    Head to toe  . Hydromorphone Hcl Itching    Tolerates Hydrocodone-Acetaminophen  . Ibuprofen Nausea And Vomiting  . Omnipaque [Iohexol]      Code: HIVES, Desc: FACIAL HIVES S/P IV CONTRAST.Marland KitchenMarland KitchenOK W/ 25MG  BENADRYL//A.C., Onset Date: [id number]   . Shellfish Allergy Swelling   Patient Active Problem List   Diagnosis Date Noted  . 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 Heme positive stool 12/14/2012  . Smoker 10/29/2012  . History  of lung cancer 10/29/2012  . Pre-operative cardiovascular examination 08/21/2012  . PVD (peripheral vascular disease) 08/21/2012  . Pain in limb 06/05/2012  . Peripheral vascular disease, unspecified 03/20/2012  . Atherosclerosis of native arteries of the extremities with intermittent claudication 03/20/2012  . 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 Aorto-iliac disease 09/18/2011  . Hypertension 12/05/2010  . Peripheral vascular disease 12/05/2010  . Glaucoma   . Asthma, intrinsic   . Ulcer   . H/O alcohol abuse     History  Substance Use Topics  . Smoking status: Current Some Day Smoker -- 0.10 packs/day for 55 years    Types: Cigarettes  . Smokeless tobacco: Never Used     Comment:    . Alcohol Use: Yes     Comment: previous history of heavy alcohol use   family history includes Alcohol abuse in his father; Cancer in his brother.     Obj her he iective:   Physical Exam  well-developed older African American male in no acute distress blood pressure 138/80 pulse 72 height 5 foot 7 weight 130. HEENT; nontraumatic normocephalic EOMI PERRLA sclera anicteric, Supple; no JVD, Cardiovascular ;regular rate and rhythm with S1-S2 no murmur or gallop, Pulmonary; clear bilaterally, Abdomen; soft nontender nondistended bowel sounds are active there is no palpable mass or hepatosplenomegaly, Rectal; exam not done stool was documented Hemoccult positive by PCP. Extremities; no clubbing cyanosis or edema skin warm and dry, Psych; mood and affect normal and appropriate         Assessment & Plan:  #52   67 year old male asymptomatic with Hemoccult positive stool on recent physical. This is in a patient on chronic aspirin and Plavix.  Patient had recent colonoscopy done less than a year ago with finding of adenomatous and hyperplastic polyps . 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 Rhea Belton Will obtain consent from Dr. Imogene Burn for patient to hold Plavix for 5 days prior to the procedure   Addendum: Reviewed and agree with initial management. Beverley Fiedler, MD

## 2012-12-21 NOTE — Patient Instructions (Signed)
You have been scheduled for an endoscopy with propofol. Please follow written instructions given to you at your visit today. If you use inhalers (even only as needed), please bring them with you on the day of your procedure. 

## 2012-12-22 ENCOUNTER — Other Ambulatory Visit: Payer: Self-pay | Admitting: *Deleted

## 2012-12-22 ENCOUNTER — Telehealth: Payer: Self-pay | Admitting: *Deleted

## 2012-12-22 NOTE — Telephone Encounter (Signed)
  12/22/2012   RE: [name] [name] DOB: 20-Oct-1945 MRN: [id number]   Dear Dr. Leonides Sake,    We have scheduled the above patient for an endoscopic procedure. Our records show that he is on anticoagulation therapy.   Please advise as to how long the patient may come off his therapy of Plavix prior to the procedure, which is scheduled for 01-04-2013.  Please fax back/ or route the completed form to [employer] CMA at [phone].   Sincerely,    Amy Esterwood PA-C     Lowry Ram CMA

## 2012-12-23 ENCOUNTER — Telehealth: Payer: Self-pay | Admitting: Family Medicine

## 2012-12-23 ENCOUNTER — Other Ambulatory Visit: Payer: Self-pay

## 2012-12-23 DIAGNOSIS — R972 Elevated prostate specific antigen [PSA]: Secondary | ICD-10-CM

## 2012-12-23 NOTE — Telephone Encounter (Signed)
Patient is requesting rx for hydrocodone. Call [phone] when ready for pick up.

## 2012-12-25 MED ORDER — HYDROCODONE-ACETAMINOPHEN 7.5-325 MG PO TABS: 1.0000 | ORAL_TABLET | ORAL | Status: DC | PRN

## 2012-12-25 NOTE — Telephone Encounter (Signed)
Med filled. Patient notified 

## 2012-12-25 NOTE — Telephone Encounter (Signed)
Last seen 12/14/12 and filled 10/20/12 #60. Please advise     KP

## 2012-12-25 NOTE — Telephone Encounter (Signed)
Refill x1 

## 2012-12-27 NOTE — Telephone Encounter (Signed)
Ok to hold Plavix as needed.

## 2012-12-28 ENCOUNTER — Encounter: Payer: Self-pay | Admitting: Internal Medicine

## 2012-12-29 ENCOUNTER — Other Ambulatory Visit: Payer: Self-pay | Admitting: Family Medicine

## 2012-12-29 ENCOUNTER — Ambulatory Visit (HOSPITAL_COMMUNITY)
Admission: RE | Admit: 2012-12-29 | Discharge: 2012-12-29 | Disposition: A | Discharge status: Home / Self Care | Payer: Medicare Other | Source: Ambulatory Visit | Attending: Vascular Surgery | Admitting: Vascular Surgery

## 2012-12-29 ENCOUNTER — Other Ambulatory Visit (HOSPITAL_COMMUNITY): Payer: Self-pay | Admitting: Family Medicine

## 2012-12-29 DIAGNOSIS — R0989 Other specified symptoms and signs involving the circulatory and respiratory systems: Secondary | ICD-10-CM

## 2012-12-30 NOTE — Telephone Encounter (Signed)
Called on 12-29-2012 and 12-30-2012. I faxed the anticoagulation letter requesting Dr. Imogene Burn addresses the Plavix medication issue.  We need to know if the patient can hold it 5 days prior which would mean he would need to stop it today.  I asked if they could get back to me today.

## 2012-12-30 NOTE — Telephone Encounter (Signed)
I spoke to [name] [name] and Dr. Uvaldo Rising had addressed this in Epic on 12-27-2012 but I had not noticed this. I didn't know this would be in Epic.  He must be part of the [facility].  Dr. Imogene Burn said the patient and hold the Plavix as needed or suggested.  We suggest 5 days prior to the procedure.  I called the patient and LM for him with this information. I asked him to call me to let me know he got this message.

## 2013-01-04 ENCOUNTER — Encounter: Payer: Self-pay | Admitting: Internal Medicine

## 2013-01-04 ENCOUNTER — Ambulatory Visit (AMBULATORY_SURGERY_CENTER): Payer: Medicare Other | Admitting: Internal Medicine

## 2013-01-04 VITALS — BP 153/95 | HR 66 | Temp 97.3°F | Resp 17 | Ht 67.0 in | Wt 130.0 lb

## 2013-01-04 DIAGNOSIS — R195 Other fecal abnormalities: Secondary | ICD-10-CM

## 2013-01-04 DIAGNOSIS — D131 Benign neoplasm of stomach: Secondary | ICD-10-CM

## 2013-01-04 DIAGNOSIS — K2970 Gastritis, unspecified, without bleeding: Secondary | ICD-10-CM

## 2013-01-04 MED ORDER — SODIUM CHLORIDE 0.9 % IV SOLN: 500.0000 mL | INTRAVENOUS | Status: DC

## 2013-01-04 NOTE — Op Note (Signed)
[facility] [address] [place], [address]   ENDOSCOPY PROCEDURE REPORT  PATIENT: Yon, Schiffman  MR#: [id number] BIRTHDATE: 09-04-1945 , 67  yrs. old GENDER: Male ENDOSCOPIST: Beverley Fiedler, MD PROCEDURE DATE:  01/04/2013 PROCEDURE:  EGD w/ biopsy ASA CLASS:     Class III INDICATIONS:  Heme positive stool. MEDICATIONS: MAC sedation, administered by CRNA and propofol (Diprivan) 200mg  IV TOPICAL ANESTHETIC: none  DESCRIPTION OF PROCEDURE: After the risks benefits and alternatives of the procedure were thoroughly explained, informed consent was obtained.  The LB [id number] endoscope was introduced through the mouth and advanced to the second portion of the duodenum. Without limitations.  The instrument was slowly withdrawn as the mucosa was fully examined.     ESOPHAGUS: The mucosa of the esophagus appeared normal.   A 3 cm hiatal hernia was noted.  STOMACH: Mild gastropathy was found in the gastric body and gastric antrum.  Multiple biopsies were performed using cold forceps.  DUODENUM: The duodenal mucosa showed no abnormalities in the bulb and second portion of the duodenum.  Retroflexed views revealed no abnormalities.     The scope was then withdrawn from the patient and the procedure completed.  COMPLICATIONS: There were no complications.  ENDOSCOPIC IMPRESSION: 1.   The mucosa of the esophagus appeared normal 2.   3 cm hiatal hernia 3.   Gastropathy was found in the gastric body and gastric antrum; multiple biopsies 4.   The duodenal mucosa showed no abnormalities in the bulb and second portion of the duodenum  RECOMMENDATIONS: 1.  Await pathology results 2.  Continue current medications 3.  Follow-up of helicobacter pylori status, treat if indicated  eSigned:  Beverley Fiedler, MD 01/04/2013 3:34 PM      CC:The Patient

## 2013-01-04 NOTE — Progress Notes (Signed)
Pt disagreeing with discharge instruction.  Pt did not want to sit in the wheel chair.  Pt called for tranportation to pick him and his fiancee up.  Dorene Grebe went down staris and waited with the pt until transportation comes. Maw

## 2013-01-04 NOTE — Progress Notes (Signed)
Called to room to assist during endoscopic procedure.  Patient ID and intended procedure confirmed with present staff. Received instructions for my participation in the procedure from the performing physician. ewm 

## 2013-01-04 NOTE — Patient Instructions (Signed)

## 2013-01-05 ENCOUNTER — Telehealth: Payer: Self-pay

## 2013-01-05 NOTE — Telephone Encounter (Signed)
Left a message at [phone] for the pt to call if any questions or concerns. maw

## 2013-01-13 ENCOUNTER — Encounter: Payer: Self-pay | Admitting: Internal Medicine

## 2013-01-14 ENCOUNTER — Encounter: Payer: Self-pay | Admitting: Vascular Surgery

## 2013-01-15 ENCOUNTER — Other Ambulatory Visit (HOSPITAL_COMMUNITY): Payer: Self-pay

## 2013-01-15 ENCOUNTER — Encounter (HOSPITAL_COMMUNITY): Payer: Medicaid Other

## 2013-01-15 ENCOUNTER — Encounter (HOSPITAL_COMMUNITY): Payer: PRIVATE HEALTH INSURANCE

## 2013-01-15 ENCOUNTER — Ambulatory Visit: Payer: PRIVATE HEALTH INSURANCE | Admitting: Vascular Surgery

## 2013-01-26 ENCOUNTER — Telehealth: Payer: Self-pay | Admitting: *Deleted

## 2013-01-26 NOTE — Telephone Encounter (Signed)
Patient called and stated that he is having some bilateral swelling his his feet. Advised patient that he needs an appointment with provider.Patient was scheduled for the next available appointment (02/01/2013).

## 2013-01-29 ENCOUNTER — Encounter: Payer: Self-pay | Admitting: Lab

## 2013-02-01 ENCOUNTER — Ambulatory Visit (INDEPENDENT_AMBULATORY_CARE_PROVIDER_SITE_OTHER): Payer: Medicare Other | Admitting: Family Medicine

## 2013-02-01 ENCOUNTER — Encounter: Payer: Self-pay | Admitting: Family Medicine

## 2013-02-01 VITALS — BP 140/82 | HR 71 | Temp 98.7°F | Wt 130.0 lb

## 2013-02-01 DIAGNOSIS — M109 Gout, unspecified: Secondary | ICD-10-CM

## 2013-02-01 DIAGNOSIS — G894 Chronic pain syndrome: Secondary | ICD-10-CM

## 2013-02-01 LAB — BASIC METABOLIC PANEL
BUN: 18 mg/dL (ref 6–23)
CHLORIDE: 99 meq/L (ref 96–112)
CO2: 29 mEq/L (ref 19–32)
CREATININE: 1.6 mg/dL — AB (ref 0.4–1.5)
Calcium: 9.7 mg/dL (ref 8.4–10.5)
GFR: 57.69 mL/min — AB (ref >60.00)
GLUCOSE: 96 mg/dL (ref 70–99)
POTASSIUM: 4.4 meq/L (ref 3.5–5.1)
Sodium: 138 mEq/L (ref 135–145)

## 2013-02-01 LAB — HEPATIC FUNCTION PANEL
ALBUMIN: 4.5 g/dL (ref 3.5–5.2)
ALK PHOS: 113 U/L (ref 39–117)
ALT: 12 U/L (ref 0–53)
AST: 21 U/L (ref 0–37)
Bilirubin, Direct: 0.1 mg/dL (ref 0.0–0.3)
Total Bilirubin: 0.7 mg/dL (ref 0.3–1.2)
Total Protein: 8.3 g/dL (ref 6.0–8.3)

## 2013-02-01 LAB — URIC ACID: Uric Acid, Serum: 9.4 mg/dL — ABNORMAL HIGH (ref 4.0–7.8)

## 2013-02-01 LAB — ALKALINE PHOSPHATASE: Alkaline Phosphatase: 113 U/L (ref 39–117)

## 2013-02-01 MED ORDER — HYDROCODONE-ACETAMINOPHEN 7.5-325 MG PO TABS: 1.0000 | ORAL_TABLET | ORAL | Status: DC | PRN

## 2013-02-01 NOTE — Patient Instructions (Signed)

## 2013-02-01 NOTE — Progress Notes (Signed)
Pre visit review using our clinic review tool, if applicable. No additional management support is needed unless otherwise documented below in the visit note. 

## 2013-02-01 NOTE — Progress Notes (Signed)
  Subjective:    [name] [name] is a 68 y.o. male who presents for evaluation of edema in alternating both toes. The edema has been severe. Onset of symptoms was several weeks ago, and patient reports symptoms have gradually improved since that time. The edema is present intermittently. The patient states he thinks he has a hx of gout but its been so long he stopped the medication.. The swelling has been aggravated by nothing. The swelling has been relieved by nothing. Associated factors include: nothing. Cardiac risk factors: advanced age (older than 9 for men, 1 for women), male gender, sedentary lifestyle and smoking/ tobacco exposure.  The following portions of the patient's history were reviewed and updated as appropriate: allergies, current medications, past family history, past medical history, past social history, past surgical history and problem list.  Review of Systems Pertinent items are noted in HPI.   Objective:    BP 140/82  Pulse 71  Temp(Src) 98.7 F (37.1 C) (Oral)  Wt 130 lb (58.968 kg)  SpO2 98% General appearance: alert, cooperative, appears stated age and no distress Lungs: clear to auscultation bilaterally Heart: S1, S2 normal Extremities: extremities normal, atraumatic, no cyanosis or edema and pain in both feet at 1st metatarsal joints   Cardiographics ECG: not done  Imaging Chest x-ray: not indicated   Assessment:     Edema secondary to probably gout.    Plan:    Recommendations: decrease sodium in the diet, elevate feet above the level of the heart whenever possible and check labs. The patient was also instructed to call IMMEDIATELY (i.e., day or night) if any cardiopulmonary symptoms occur, especially chest pain, shortness of breath, dyspnea on exertion, paroxysmal nocturnal dyspnea, or orthopnea, and these were explained. Follow up in a few days and as needed.

## 2013-02-05 ENCOUNTER — Other Ambulatory Visit: Payer: Self-pay

## 2013-02-05 MED ORDER — ALLOPURINOL 100 MG PO TABS: 100.0000 mg | ORAL_TABLET | Freq: Every day | ORAL | Status: DC

## 2013-02-18 ENCOUNTER — Encounter: Payer: Self-pay | Admitting: Vascular Surgery

## 2013-02-19 ENCOUNTER — Encounter: Payer: Self-pay | Admitting: Vascular Surgery

## 2013-02-19 ENCOUNTER — Ambulatory Visit (HOSPITAL_COMMUNITY)
Admission: RE | Admit: 2013-02-19 | Discharge: 2013-02-19 | Disposition: A | Discharge status: Home / Self Care | Payer: Medicare Other | Source: Ambulatory Visit | Attending: Vascular Surgery | Admitting: Vascular Surgery

## 2013-02-19 ENCOUNTER — Ambulatory Visit (INDEPENDENT_AMBULATORY_CARE_PROVIDER_SITE_OTHER)
Admission: RE | Admit: 2013-02-19 | Discharge: 2013-02-19 | Disposition: A | Discharge status: Home / Self Care | Payer: Medicare Other | Source: Ambulatory Visit | Attending: Vascular Surgery | Admitting: Vascular Surgery

## 2013-02-19 ENCOUNTER — Ambulatory Visit (INDEPENDENT_AMBULATORY_CARE_PROVIDER_SITE_OTHER): Payer: Medicare Other | Admitting: Vascular Surgery

## 2013-02-19 VITALS — BP 154/85 | HR 73 | Ht 67.0 in | Wt 125.0 lb

## 2013-02-19 DIAGNOSIS — I779 Disorder of arteries and arterioles, unspecified: Secondary | ICD-10-CM

## 2013-02-19 DIAGNOSIS — I70219 Atherosclerosis of native arteries of extremities with intermittent claudication, unspecified extremity: Secondary | ICD-10-CM

## 2013-02-19 DIAGNOSIS — I10 Essential (primary) hypertension: Secondary | ICD-10-CM | POA: Insufficient documentation

## 2013-02-19 DIAGNOSIS — Z48812 Encounter for surgical aftercare following surgery on the circulatory system: Secondary | ICD-10-CM

## 2013-02-19 DIAGNOSIS — I739 Peripheral vascular disease, unspecified: Secondary | ICD-10-CM

## 2013-02-19 DIAGNOSIS — I70209 Unspecified atherosclerosis of native arteries of extremities, unspecified extremity: Secondary | ICD-10-CM | POA: Insufficient documentation

## 2013-02-19 DIAGNOSIS — F17200 Nicotine dependence, unspecified, uncomplicated: Secondary | ICD-10-CM | POA: Insufficient documentation

## 2013-02-19 DIAGNOSIS — I7409 Other arterial embolism and thrombosis of abdominal aorta: Secondary | ICD-10-CM

## 2013-02-19 NOTE — Addendum Note (Signed)
Addended by: Peter Minium K on: 02/19/2013 01:00 PM   Modules accepted: Orders

## 2013-02-19 NOTE — Progress Notes (Signed)
Established Previous Bypass  History of Present Illness  [name] [name] is a 68 y.o. (01-Jul-1945) male who presents with chief complaint: burning pain in L calf.  Previous operation(s) complete on 08/30/12 include:  OPERATIVE NOTE  1. Left iliofemoral endarterectomy with bovine patch angioplasty  2. Left common femoral artery to anterior tibial artery and below-the-knee popliteal artery bypass with Propaten  3. Left below-the-knee popliteal artery endarterectomy with bovine patch angioplasty  4. Left proximal anterior tibial artery endarterectomy with bovine patch angioplasty  The patient has also had a R iliofemoral EA with BPA previous.  The patient's symptoms are: left calf burning with associated rash.  He also notes some burning in Left groin incision. He also notes radicular sx in his left leg.  He has a known history of back problems.  The patient's treatment regimen currently included: maximal medical management.  Pt continues to smoke.  The patient did not follow up from his prior procedure.  The patient's PMH, PSH, SH, FamHx, Med, and Allergies are unchanged from 09/09/12.  On ROS today: improved intermittent claudication , no rest pain, radicular sx in Left leg.  Physical Examination  Filed Vitals:   02/19/13 0927  BP: 154/85  Pulse: 73  Height: 5\' 7"  (1.702 m)  Weight: 125 lb (56.7 kg)  SpO2: 100%   Body mass index is 19.57 kg/(m^2).  Physical Examination  Filed Vitals:   02/19/13 0927  BP: 154/85  Pulse: 73  Height: 5\' 7"  (1.702 m)  Weight: 125 lb (56.7 kg)  SpO2: 100%   Body mass index is 19.57 kg/(m^2).  General: A&O x 3, WD, thin  Pulmonary: Sym exp, good air movt, CTAB, no rales, rhonchi, & wheezing  Cardiac: RRR, Nl S1, S2, no Murmurs, rubs or gallops  Vascular: Vessel Right Left  Radial Palpable Palpable  Brachial Palpable Palpable  Carotid Palpable, without bruit Palpable, without bruit  Aorta Not palpable N/A  Femoral Palpable Palpable    Popliteal Not palpable Not palpable  PT Not Palpable Not Palpable  DP Not Palpable Palpable   Gastrointestinal: soft, NTND, -G/R, - HSM, - masses, - CVAT B  Musculoskeletal: M/S 5/5 throughout , Extremities without ischemic changes , bypass incisions healed completely, healing scabs in dermatomal pattern, burning tenderness with palpation at these sites  Neurologic: Pain and light touch intact in extremities , Motor exam as listed above  Non-Invasive Vascular Imaging ABI (Date: 02/19/2013) R: 0.49 (0.60), DP: mono, PT: mono, TBI: 0.24 L: 0.75 (0.65), DP: mono, PT: mono, TBI: 0.55 (0.35)  LLE arterial duplex Duplex (Date: 02/19/2013)  Dampened wave forms in CFA  Patent bypass without any stenoses  Medical Decision Making  [name] [name] is a 68 y.o. male who presents with: radicular sx in L leg, s/p Left CFA to AT/BK pop bypass with Propaten, possible shingles  Based on the patient's vascular studies and examination, I have offered the patient: BLE ABI, bypass duplex, aortoiliac duplex in 3 months.  The left external iliac artery stent will need to be followed closely as it demonstrated some restenosis already.  I discussed in depth with the patient the nature of atherosclerosis, and emphasized the importance of maximal medical management including strict control of blood pressure, blood glucose, and lipid levels, obtaining regular exercise, and cessation of smoking.  The patient is aware that without maximal medical management the underlying atherosclerotic disease process will progress, limiting the benefit of any interventions.  I discussed in depth with the patient the need for long  term surveillance to improve the primary assisted patency of his bypass.  The patient agrees to cooperate with such.  The patient is scheduled for the previous listed surveillance studies in 3 months.  The patient also has some type of rash with neuropathic sx reminiscent of shingles.  I referred him  back to PCP for evaluation.  These rashes are not involved with the surgical incision and unrelated.  Similarly, his radicular sx in his left leg are unrelated to his surgical issues.  Thank you for allowing [place] to participate in this patient's care.  Adele Barthel, MD [facility] [place] [facility] Office: [phone] Pager: [phone]  02/19/2013, 10:01 AM

## 2013-04-28 ENCOUNTER — Telehealth: Payer: Self-pay

## 2013-04-28 ENCOUNTER — Encounter: Payer: Self-pay | Admitting: Family Medicine

## 2013-04-28 ENCOUNTER — Ambulatory Visit (INDEPENDENT_AMBULATORY_CARE_PROVIDER_SITE_OTHER): Payer: Medicare Other | Admitting: Family Medicine

## 2013-04-28 VITALS — BP 130/84 | HR 73 | Temp 97.5°F | Wt 135.0 lb

## 2013-04-28 DIAGNOSIS — C61 Malignant neoplasm of prostate: Secondary | ICD-10-CM | POA: Insufficient documentation

## 2013-04-28 DIAGNOSIS — G894 Chronic pain syndrome: Secondary | ICD-10-CM

## 2013-04-28 DIAGNOSIS — R21 Rash and other nonspecific skin eruption: Secondary | ICD-10-CM

## 2013-04-28 MED ORDER — NONFORMULARY OR COMPOUNDED ITEM: Status: DC

## 2013-04-28 MED ORDER — HYDROCODONE-ACETAMINOPHEN 7.5-325 MG PO TABS: 1.0000 | ORAL_TABLET | ORAL | Status: DC | PRN

## 2013-04-28 MED ORDER — CEPHALEXIN 500 MG PO CAPS: 500.0000 mg | ORAL_CAPSULE | Freq: Two times a day (BID) | ORAL | Status: DC

## 2013-04-28 NOTE — Progress Notes (Signed)
Pre visit review using our clinic review tool, if applicable. No additional management support is needed unless otherwise documented below in the visit note. 

## 2013-04-28 NOTE — Telephone Encounter (Signed)
Called for clarification regarding Triamcinolone and Eucerin cream. Strength required.  Spoke with Dr. Etter Sjogren. Ordered 0.5%, cream.

## 2013-04-28 NOTE — Patient Instructions (Signed)
Take keflex until it is done  Use cream 2x a day Call us if it does not improve in 2 weeks or if it worsens.

## 2013-04-28 NOTE — Progress Notes (Signed)
  Subjective:     [name] [name] is a 68 y.o. male who presents for evaluation of a rash involving the leg. Rash started a few months ago. Lesions are dark, and flat in texture. Rash has not changed over time. Rash is painful and is pruritic. Associated symptoms: none. Patient denies: abdominal pain, arthralgia, congestion, cough, crankiness, decrease in appetite, decrease in energy level, fever, headache, irritability, myalgia, nausea, sore throat and vomiting. Patient has not had contacts with similar rash. Patient has not had new exposures (soaps, lotions, laundry detergents, foods, medications, plants, insects or animals).  The following portions of the patient's history were reviewed and updated as appropriate: allergies, current medications, past family history, past medical history, past social history, past surgical history and problem list.  Review of Systems Pertinent items are noted in HPI.    Objective:    BP 130/84  Pulse 73  Temp(Src) 97.5 F (36.4 C) (Oral)  Wt 135 lb (61.236 kg)  SpO2 98% General:  alert, cooperative, appears stated age and no distress  Skin:  hyperpigmentation noted on L LE  and scales noted on LLE-               EXT-- no calf pain  Assessment:    dermatitis    Plan:    Medications: antibiotics: keflex and topical steroid: triamcinolone with eucerin. verbal and written patient instruction given. Follow up in prn -.

## 2013-05-31 ENCOUNTER — Other Ambulatory Visit: Payer: Self-pay

## 2013-05-31 DIAGNOSIS — I10 Essential (primary) hypertension: Secondary | ICD-10-CM

## 2013-05-31 MED ORDER — AMLODIPINE BESYLATE 10 MG PO TABS: 10.0000 mg | ORAL_TABLET | Freq: Every day | ORAL | Status: DC

## 2013-06-10 ENCOUNTER — Encounter: Payer: Self-pay | Admitting: Vascular Surgery

## 2013-06-11 ENCOUNTER — Ambulatory Visit (INDEPENDENT_AMBULATORY_CARE_PROVIDER_SITE_OTHER)
Admission: RE | Admit: 2013-06-11 | Discharge: 2013-06-11 | Disposition: A | Discharge status: Home / Self Care | Payer: Medicare Other | Source: Ambulatory Visit | Attending: Vascular Surgery | Admitting: Vascular Surgery

## 2013-06-11 ENCOUNTER — Encounter: Payer: Self-pay | Admitting: Vascular Surgery

## 2013-06-11 ENCOUNTER — Ambulatory Visit (HOSPITAL_COMMUNITY)
Admission: RE | Admit: 2013-06-11 | Discharge: 2013-06-11 | Disposition: A | Discharge status: Home / Self Care | Payer: Medicare Other | Source: Ambulatory Visit | Attending: Vascular Surgery | Admitting: Vascular Surgery

## 2013-06-11 ENCOUNTER — Ambulatory Visit (INDEPENDENT_AMBULATORY_CARE_PROVIDER_SITE_OTHER): Payer: Medicare Other | Admitting: Vascular Surgery

## 2013-06-11 VITALS — BP 155/89 | HR 103 | Resp 14 | Ht 67.0 in | Wt 131.0 lb

## 2013-06-11 DIAGNOSIS — I7409 Other arterial embolism and thrombosis of abdominal aorta: Secondary | ICD-10-CM

## 2013-06-11 DIAGNOSIS — I779 Disorder of arteries and arterioles, unspecified: Secondary | ICD-10-CM

## 2013-06-11 DIAGNOSIS — I70219 Atherosclerosis of native arteries of extremities with intermittent claudication, unspecified extremity: Secondary | ICD-10-CM

## 2013-06-11 DIAGNOSIS — I739 Peripheral vascular disease, unspecified: Secondary | ICD-10-CM

## 2013-06-11 NOTE — Progress Notes (Signed)
Established Previous Bypass  History of Present Illness  [name] [name] is a 68 y.o. (10-Mar-1945) male who presents with chief complaint: bilateral leg claudication.  Previous operation(s) complete include:  1.  L iliofem EA w/ BPA, L CFA to AT/BK pop bypass with propaten, L BK and AT EA w/ BPA (09/09/12) 2.  PTA L EIA PTA, R CIA PTA x 2 (08/03/12) 3.  R  iliofem EA w/ BPA, extended profundaplasty, PTA R EIA & CIA (05/16/10) The patient's symptoms have not progressed.  The patient's symptoms are: intermittent claudication.  The patient continues to smoke.  His medical compliance has been limited in the past.  The patient's treatment regimen currently included: maximal medical management.  The patient's PMH, PSH, SH, FamHx, Med, and Allergies are unchanged from 02/19/13.  On ROS today: continue intermittent claudication , no rest pain, no gangrene or ulcers  Physical Examination  Filed Vitals:   06/11/13 1119  BP: 155/89  Pulse: 103  Resp: 14  Height: 5\' 7"  (1.702 m)  Weight: 131 lb (59.421 kg)   Body mass index is 20.51 kg/(m^2).  Physical Examination  Filed Vitals:   06/11/13 1119  BP: 155/89  Pulse: 103  Resp: 14  Height: 5\' 7"  (1.702 m)  Weight: 131 lb (59.421 kg)   Body mass index is 20.51 kg/(m^2).  General: A&O x 3, WD, thin  Pulmonary: Sym exp, good air movt, CTAB, no rales, rhonchi, & wheezing  Cardiac: RRR, Nl S1, S2, no Murmurs, rubs or gallops  Vascular: Vessel Right Left  Radial Palpable Palpable  Brachial Palpable Palpable  Carotid Palpable, without bruit Palpable, without bruit  Aorta Not palpable N/A  Femoral Not Palpable Palpable  Popliteal Not palpable Not palpable  PT Not Palpable Not Palpable  DP Not Palpable Not Palpable   Gastrointestinal: soft, NTND, -G/R, - HSM, - masses, - CVAT B  Musculoskeletal: M/S 5/5 throughout , Extremities without ischemic changes   Neurologic: Pain and light touch intact in extremities , Motor exam as listed  above  Non-Invasive Vascular Imaging ABI (Date: 06/11/2013) R: 0.68 (0.49), DP: mono, PT: mono, TBI: 0.43 L: 0.86 (0.75), DP: mono, PT: mono, TBI: 0.59  Aortoiliac Duplex (Date: 06/11/2013)  Ao: 20 c/s (bi)  R iliac: 37-46 c/s (mono)  L iliac: 45-126 c/s (mono)  Occluded R CFA  LLE bypass duplex (06/11/2013)  Patent bypass  Proximal disease  Medical Decision Making  [name] [name] is a 68 y.o. male who presents with: BLE intermittent claudication , medical non-compliance,  s/p multiple B interventions .  Based on the patient's vascular studies and examination, I have offered the patient: BLE ABI, bypass duplex, aortoiliac duplex q 3 months.  Pt has had non-existent durability from his interventions.  His aortoiliac duplex suggests the right CFA might be occluded.  He does not have significant sx at this point.  I suspect his non-compliance with his medical regimen and continued smoking has contributed to progression of his underlying atherosclerosis.  Again his previously placed iliac stents demonstrate poor patency.  He is not interested in reintervention at this point.  I have discussed relining his iliac stents or proceeding with an aortobifemoral bypass, but neither interests the patient.  Unfortunately, without smoking cessation and better medical management of his underlying atherosclerosis, I doubt any intervention to be of lasting benefit.  I discussed in depth with the patient the nature of atherosclerosis, and emphasized the importance of maximal medical management including strict control of blood pressure, blood  glucose, and lipid levels, obtaining regular exercise, and cessation of smoking.  The patient is aware that without maximal medical management the underlying atherosclerotic disease process will progress, limiting the benefit of any interventions.  Thank you for allowing [place] to participate in this patient's care.  Adele Barthel, MD [facility] [place] Office: [phone] Pager: [phone]  06/11/2013, 1:29 PM

## 2013-06-29 ENCOUNTER — Ambulatory Visit (INDEPENDENT_AMBULATORY_CARE_PROVIDER_SITE_OTHER): Payer: Medicare Other | Admitting: Family Medicine

## 2013-06-29 ENCOUNTER — Encounter: Payer: Self-pay | Admitting: Family Medicine

## 2013-06-29 VITALS — BP 132/80 | HR 79 | Temp 98.1°F | Wt 128.0 lb

## 2013-06-29 DIAGNOSIS — R1011 Right upper quadrant pain: Secondary | ICD-10-CM

## 2013-06-29 DIAGNOSIS — M109 Gout, unspecified: Secondary | ICD-10-CM

## 2013-06-29 DIAGNOSIS — G894 Chronic pain syndrome: Secondary | ICD-10-CM

## 2013-06-29 DIAGNOSIS — I10 Essential (primary) hypertension: Secondary | ICD-10-CM

## 2013-06-29 LAB — CBC WITH DIFFERENTIAL/PLATELET
Basophils Absolute: 0.1 K/uL (ref 0.0–0.1)
Basophils Relative: 0.6 % (ref 0.0–3.0)
Eosinophils Absolute: 0.8 K/uL — ABNORMAL HIGH (ref 0.0–0.7)
Eosinophils Relative: 9 % — ABNORMAL HIGH (ref 0.0–5.0)
HCT: 40.3 % (ref 39.0–52.0)
Hemoglobin: 13.2 g/dL (ref 13.0–17.0)
Lymphocytes Relative: 29.4 % (ref 12.0–46.0)
Lymphs Abs: 2.6 K/uL (ref 0.7–4.0)
MCHC: 32.8 g/dL (ref 30.0–36.0)
MCV: 86.9 fl (ref 78.0–100.0)
Monocytes Absolute: 0.5 K/uL (ref 0.1–1.0)
Monocytes Relative: 5.6 % (ref 3.0–12.0)
Neutro Abs: 4.8 K/uL (ref 1.4–7.7)
Neutrophils Relative %: 55.4 % (ref 43.0–77.0)
Platelets: 225 K/uL (ref 150.0–400.0)
RBC: 4.64 Mil/uL (ref 4.22–5.81)
RDW: 16.5 % — ABNORMAL HIGH (ref 11.5–15.5)
WBC: 8.7 K/uL (ref 4.0–10.5)

## 2013-06-29 LAB — HEPATIC FUNCTION PANEL
ALT: 11 U/L (ref 0–53)
AST: 24 U/L (ref 0–37)
Albumin: 4.6 g/dL (ref 3.5–5.2)
Alkaline Phosphatase: 138 U/L — ABNORMAL HIGH (ref 39–117)
Bilirubin, Direct: 0.1 mg/dL (ref 0.0–0.3)
Total Bilirubin: 0.5 mg/dL (ref 0.2–1.2)
Total Protein: 8.7 g/dL — ABNORMAL HIGH (ref 6.0–8.3)

## 2013-06-29 LAB — BASIC METABOLIC PANEL
BUN: 20 mg/dL (ref 6–23)
CO2: 26 meq/L (ref 19–32)
CREATININE: 1.7 mg/dL — AB (ref 0.4–1.5)
Calcium: 9.7 mg/dL (ref 8.4–10.5)
Chloride: 104 mEq/L (ref 96–112)
GFR: 53.26 mL/min — AB (ref >60.00)
GLUCOSE: 67 mg/dL — AB (ref 70–99)
Potassium: 4.2 mEq/L (ref 3.5–5.1)
Sodium: 142 mEq/L (ref 135–145)

## 2013-06-29 LAB — LIPID PANEL
CHOL/HDL RATIO: 4
Cholesterol: 195 mg/dL (ref 0–200)
HDL: 47.7 mg/dL (ref >39.00)
LDL Cholesterol: 109 mg/dL — ABNORMAL HIGH (ref 0–99)
Triglycerides: 192 mg/dL — ABNORMAL HIGH (ref 0.0–149.0)
VLDL: 38.4 mg/dL (ref 0.0–40.0)

## 2013-06-29 LAB — H. PYLORI ANTIBODY, IGG: H Pylori IgG: NEGATIVE

## 2013-06-29 MED ORDER — HYDROCODONE-ACETAMINOPHEN 7.5-325 MG PO TABS: 1.0000 | ORAL_TABLET | ORAL | Status: DC | PRN

## 2013-06-29 NOTE — Progress Notes (Signed)
  Subjective:     [name] [name] is a 68 y.o. male who presents for evaluation of abdominal pain. Onset was 1 year ago. Symptoms have been gradually worsening. The pain is described as aching, pressure-like and sharp, and is 10/10 in intensity. Pain is located in the RUQ without radiation.  Aggravating factors: movement.  Alleviating factors: pain meds. Associated symptoms: anorexia. The patient denies arthralagias, belching, chills, constipation, diarrhea, dysuria, fever, flatus, frequency, nausea and vomiting.  The patient's history has been marked as reviewed and updated as appropriate.  Review of Systems Pertinent items are noted in HPI.     Objective:    BP 132/80  Pulse 79  Temp(Src) 98.1 F (36.7 C) (Oral)  Wt 128 lb (58.06 kg)  SpO2 99% General appearance: alert, cooperative, appears stated age and no distress Throat: lips, mucosa, and tongue normal; teeth and gums normal Neck: no adenopathy, supple, symmetrical, trachea midline and thyroid not enlarged, symmetric, no tenderness/mass/nodules Lungs: clear to auscultation bilaterally Heart: regular rate and rhythm, S1, S2 normal, no murmur, click, rub or gallop Abdomen: abnormal findings:  mild tenderness in the RUQ                 Soft, no rebound / guarding Extremities: extremities normal, atraumatic, no cyanosis or edema    Assessment:    Abdominal pain, likely secondary to GB? Marland Kitchen    Plan:    The diagnosis was discussed with the patient and evaluation and treatment plans outlined. See orders for lab and imaging studies. Adhere to simple, bland diet. Further follow-up plans will be based on outcome of lab/imaging studies; see orders.

## 2013-06-29 NOTE — Progress Notes (Signed)
Pre visit review using our clinic review tool, if applicable. No additional management support is needed unless otherwise documented below in the visit note. 

## 2013-06-29 NOTE — Patient Instructions (Signed)
Abdominal Pain, Adult °Many things can cause abdominal pain. Usually, abdominal pain is not caused by a disease and will improve without treatment. It can often be observed and treated at home. Your health care provider will do a physical exam and possibly order blood tests and X-rays to help determine the seriousness of your pain. However, in many cases, more time must pass before a clear cause of the pain can be found. Before that point, your health care provider may not know if you need more testing or further treatment. °HOME CARE INSTRUCTIONS  °Monitor your abdominal pain for any changes. The following actions may help to alleviate any discomfort you are experiencing: °· Only take over-the-counter or prescription medicines as directed by your health care provider. °· Do not take laxatives unless directed to do so by your health care provider. °· Try a clear liquid diet (broth, tea, or water) as directed by your health care provider. Slowly move to a bland diet as tolerated. °SEEK MEDICAL CARE IF: °· You have unexplained abdominal pain. °· You have abdominal pain associated with nausea or diarrhea. °· You have pain when you urinate or have a bowel movement. °· You experience abdominal pain that wakes you in the night. °· You have abdominal pain that is worsened or improved by eating food. °· You have abdominal pain that is worsened with eating fatty foods. °SEEK IMMEDIATE MEDICAL CARE IF:  °· Your pain does not go away within 2 hours. °· You have a fever. °· You keep throwing up (vomiting). °· Your pain is felt only in portions of the abdomen, such as the right side or the left lower portion of the abdomen. °· You pass bloody or black tarry stools. °MAKE SURE YOU: °· Understand these instructions.   °· Will watch your condition.   °· Will get help right away if you are not doing well or get worse.   °Document Released: 10/24/2004 Document Revised: 11/04/2012 Document Reviewed: 09/23/2012 °[employer]® Patient  Information ©2014 [employer]. ° °

## 2013-06-30 ENCOUNTER — Telehealth: Payer: Self-pay | Admitting: Family Medicine

## 2013-06-30 LAB — POCT URINALYSIS DIPSTICK
BILIRUBIN UA: NEGATIVE
GLUCOSE UA: NEGATIVE
KETONES UA: NEGATIVE
LEUKOCYTES UA: NEGATIVE
NITRITE UA: NEGATIVE
RBC UA: NEGATIVE
Spec Grav, UA: 1.02
Urobilinogen, UA: 0.2
pH, UA: 5

## 2013-06-30 NOTE — Telephone Encounter (Signed)
Relevant patient education mailed to patient.  

## 2013-07-01 ENCOUNTER — Other Ambulatory Visit: Payer: Self-pay | Admitting: Family Medicine

## 2013-07-01 ENCOUNTER — Ambulatory Visit (HOSPITAL_BASED_OUTPATIENT_CLINIC_OR_DEPARTMENT_OTHER)
Admission: RE | Admit: 2013-07-01 | Discharge: 2013-07-01 | Disposition: A | Discharge status: Home / Self Care | Payer: Medicare Other | Source: Ambulatory Visit | Attending: Family Medicine | Admitting: Family Medicine

## 2013-07-01 DIAGNOSIS — R1011 Right upper quadrant pain: Secondary | ICD-10-CM | POA: Diagnosis present

## 2013-07-01 DIAGNOSIS — F1010 Alcohol abuse, uncomplicated: Secondary | ICD-10-CM | POA: Insufficient documentation

## 2013-07-01 DIAGNOSIS — K810 Acute cholecystitis: Secondary | ICD-10-CM

## 2013-07-01 DIAGNOSIS — K279 Peptic ulcer, site unspecified, unspecified as acute or chronic, without hemorrhage or perforation: Secondary | ICD-10-CM | POA: Insufficient documentation

## 2013-07-01 DIAGNOSIS — K861 Other chronic pancreatitis: Secondary | ICD-10-CM | POA: Insufficient documentation

## 2013-07-06 ENCOUNTER — Telehealth: Payer: Self-pay

## 2013-07-06 NOTE — Telephone Encounter (Signed)
Use albuterol no more than 4 x a day and if it does not work --- go to [facility]

## 2013-07-06 NOTE — Telephone Encounter (Signed)
Received verbal from PCP to advise patient to use albuterol inhaler as needed and to come in to be seen. Patient is scheduled for Wed 07/07/2013.

## 2013-07-06 NOTE — Telephone Encounter (Signed)
Patient called in and states that he has a cough with mucus, wheezing and SOB with excertion. Is not in any immediate distress. States that he will not be able to get a ride to the office until Friday. He does have a ProAir inhaler but has not been using because someone discontinued.   Please advise

## 2013-07-07 ENCOUNTER — Encounter: Payer: Self-pay | Admitting: Family Medicine

## 2013-07-07 ENCOUNTER — Ambulatory Visit (INDEPENDENT_AMBULATORY_CARE_PROVIDER_SITE_OTHER): Payer: Medicare Other | Admitting: Family Medicine

## 2013-07-07 VITALS — BP 120/80 | HR 109 | Temp 98.9°F | Wt 125.0 lb

## 2013-07-07 DIAGNOSIS — R059 Cough, unspecified: Secondary | ICD-10-CM

## 2013-07-07 DIAGNOSIS — R509 Fever, unspecified: Secondary | ICD-10-CM

## 2013-07-07 DIAGNOSIS — J302 Other seasonal allergic rhinitis: Secondary | ICD-10-CM

## 2013-07-07 DIAGNOSIS — J309 Allergic rhinitis, unspecified: Secondary | ICD-10-CM

## 2013-07-07 DIAGNOSIS — N390 Urinary tract infection, site not specified: Secondary | ICD-10-CM

## 2013-07-07 DIAGNOSIS — R05 Cough: Secondary | ICD-10-CM

## 2013-07-07 LAB — POCT INFLUENZA A/B
Influenza A, POC: NEGATIVE
Influenza B, POC: NEGATIVE

## 2013-07-07 MED ORDER — CLARITHROMYCIN ER 500 MG PO TB24: 1000.0000 mg | ORAL_TABLET | Freq: Every day | ORAL | Status: DC

## 2013-07-07 MED ORDER — FLUTICASONE PROPIONATE 50 MCG/ACT NA SUSP: 2.0000 | Freq: Every day | NASAL | Status: AC

## 2013-07-07 MED ORDER — PROMETHAZINE-DM 6.25-15 MG/5ML PO SYRP: 5.0000 mL | ORAL_SOLUTION | Freq: Four times a day (QID) | ORAL | Status: DC | PRN

## 2013-07-07 NOTE — Progress Notes (Signed)
  Subjective:     [name] [name] is a 68 y.o. male here for evaluation of a cough. Onset of symptoms was 5 days ago. Symptoms have been gradually worsening since that time. The cough is productive and is aggravated by reclining position. Associated symptoms include: chills, fever, postnasal drip and sputum production. Patient does not have a history of asthma. Patient does have a history of environmental allergens. Patient has not traveled recently. Patient does have a history of smoking. Patient has not had a  Recent previous chest x-ray. Patient has not had a PPD done.  The following portions of the patient's history were reviewed and updated as appropriate: allergies, current medications, past family history, past medical history, past social history, past surgical history and problem list.  Review of Systems Pertinent items are noted in HPI.    Objective:    Oxygen saturation 98% on room air BP 120/80  Pulse 109  Temp(Src) 98.9 F (37.2 C) (Oral)  Wt 125 lb (56.7 kg)  SpO2 98% General appearance: alert, cooperative, appears stated age and mild distress Ears: normal TM's and external ear canals both ears Nose: moderate congestion, turbinates red, swollen Throat: lips, mucosa, and tongue normal; teeth and gums normal Neck: no adenopathy, supple, symmetrical, trachea midline and thyroid not enlarged, symmetric, no tenderness/mass/nodules Lungs: diminished breath sounds bilaterally and wheezes bilaterally Heart: S1, S2 normal    Assessment:    Acute Bronchitis    Plan:    Antibiotics per medication orders. Antitussives per medication orders. Avoid exposure to tobacco smoke and fumes. Call if shortness of breath worsens, blood in sputum, change in character of cough, development of fever or chills, inability to maintain nutrition and hydration. Avoid exposure to tobacco smoke and fumes. Chest x-ray. f/u 2 weeks

## 2013-07-07 NOTE — Progress Notes (Signed)
Pre visit review using our clinic review tool, if applicable. No additional management support is needed unless otherwise documented below in the visit note. 

## 2013-07-07 NOTE — Patient Instructions (Signed)

## 2013-07-08 ENCOUNTER — Other Ambulatory Visit: Payer: Self-pay | Admitting: Family Medicine

## 2013-07-08 ENCOUNTER — Ambulatory Visit (INDEPENDENT_AMBULATORY_CARE_PROVIDER_SITE_OTHER)
Admission: RE | Admit: 2013-07-08 | Discharge: 2013-07-08 | Disposition: A | Discharge status: Home / Self Care | Payer: Medicare Other | Source: Ambulatory Visit | Attending: Family Medicine | Admitting: Family Medicine

## 2013-07-08 ENCOUNTER — Telehealth: Payer: Self-pay

## 2013-07-08 DIAGNOSIS — R059 Cough, unspecified: Secondary | ICD-10-CM

## 2013-07-08 DIAGNOSIS — R05 Cough: Secondary | ICD-10-CM

## 2013-07-08 DIAGNOSIS — J189 Pneumonia, unspecified organism: Secondary | ICD-10-CM

## 2013-07-08 MED ORDER — PREDNISONE 10 MG PO TABS: ORAL_TABLET | ORAL | Status: DC

## 2013-07-08 NOTE — Telephone Encounter (Signed)
Msg to sent to [place] to schedule follow up.    KP

## 2013-07-08 NOTE — Telephone Encounter (Addendum)
Date & Time Provider Department Encounter # Center 07/12/2013 1:45 PM   Spoke with the patient and made him aware and the patient stated he did not have transportation, he would need to call 4 days in advance before he could get a ride. He would rather wait until he had his gallbladder surgery before he seen the pulmonary doctor. I tried to encourage him to keep apt and he said he did not drive anymore because of his glaucoma. [place] has been made aware and the apt has been cancelled.     KP

## 2013-07-08 NOTE — Telephone Encounter (Signed)
Message copied by Ewing Schlein on Thu Jul 08, 2013  2:35 PM ------      Message from: Rosalita Chessman      Created: Thu Jul 08, 2013 10:00 AM       Pneumonitis and copd----  pred taper f/u 10-14 days ------

## 2013-07-08 NOTE — Telephone Encounter (Signed)
Schedule f/u with Wert for after surgery

## 2013-07-08 NOTE — Telephone Encounter (Signed)
Patient has been made aware, he stated who this affect his appetite, he said he has not been able to eat for the last few days and he is concerned. Patient has a history of lung cancer and I was going to have him scheduled for follow up with Dr.Wert but he is scheduled to have gallbladder surgery.    KP

## 2013-07-12 ENCOUNTER — Ambulatory Visit: Payer: Medicare Other | Admitting: Internal Medicine

## 2013-07-15 ENCOUNTER — Ambulatory Visit (INDEPENDENT_AMBULATORY_CARE_PROVIDER_SITE_OTHER): Payer: PRIVATE HEALTH INSURANCE | Admitting: Surgery

## 2013-07-20 ENCOUNTER — Encounter (INDEPENDENT_AMBULATORY_CARE_PROVIDER_SITE_OTHER): Payer: Self-pay | Admitting: Surgery

## 2013-07-20 ENCOUNTER — Encounter (INDEPENDENT_AMBULATORY_CARE_PROVIDER_SITE_OTHER): Payer: Self-pay

## 2013-07-20 ENCOUNTER — Ambulatory Visit (INDEPENDENT_AMBULATORY_CARE_PROVIDER_SITE_OTHER): Payer: PRIVATE HEALTH INSURANCE | Admitting: Surgery

## 2013-07-20 VITALS — BP 126/76 | HR 80 | Temp 98.0°F | Resp 18 | Ht 70.0 in | Wt 123.0 lb

## 2013-07-20 DIAGNOSIS — Z91199 Patient's noncompliance with other medical treatment and regimen due to unspecified reason: Secondary | ICD-10-CM | POA: Insufficient documentation

## 2013-07-20 DIAGNOSIS — K449 Diaphragmatic hernia without obstruction or gangrene: Secondary | ICD-10-CM | POA: Insufficient documentation

## 2013-07-20 DIAGNOSIS — Z9119 Patient's noncompliance with other medical treatment and regimen: Secondary | ICD-10-CM | POA: Insufficient documentation

## 2013-07-20 DIAGNOSIS — J449 Chronic obstructive pulmonary disease, unspecified: Secondary | ICD-10-CM

## 2013-07-20 DIAGNOSIS — K8010 Calculus of gallbladder with chronic cholecystitis without obstruction: Secondary | ICD-10-CM

## 2013-07-20 DIAGNOSIS — F17200 Nicotine dependence, unspecified, uncomplicated: Secondary | ICD-10-CM
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
## 2013-07-20 NOTE — Patient Instructions (Signed)
Please consider the recommendations that we have given you today:  Consider surgery to laparoscopically remove your gallbladder that is giving you the right upper abdominal pain and nausea.  Reconsider starting back on Plavix so that you do not lose your legs & get them amputated.  Also quit smoking.  See Dr Melvyn Novas with pulmonary to make sure your pneumonia has resolved & your lungs are in the best shape possible for surgery.  See the Handout(s) we have given you.  Please call our office at [phone] if you wish to schedule surgery or if you have further questions / concerns.   Cholecystitis Cholecystitis is an inflammation of your gallbladder. It is usually caused by a buildup of gallstones or sludge (cholelithiasis) in your gallbladder. The gallbladder stores a fluid that helps digest fats (bile). Cholecystitis is serious and needs treatment right away.  CAUSES   Gallstones. Gallstones can block the tube that leads to your gallbladder, causing bile to build up. As bile builds up, the gallbladder becomes inflamed.  Bile duct problems, such as blockage from scarring or kinking.  Tumors. Tumors can stop bile from leaving your gallbladder correctly, causing bile to build up. As bile builds up, the gallbladder becomes inflamed. SYMPTOMS   Nausea.  Vomiting.  Abdominal pain, especially in the upper right area of your abdomen.  Abdominal tenderness or bloating.  Sweating.  Chills.  Fever.  Yellowing of the skin and the whites of the eyes (jaundice). DIAGNOSIS  Your caregiver may order blood tests to look for infection or gallbladder problems. Your caregiver may also order imaging tests, such as an ultrasound or computed tomography (CT) scan. Further tests may include a hepatobiliary iminodiacetic acid (HIDA) scan. This scan allows your caregiver to see your bile move from the liver to the gallbladder and to the small intestine. TREATMENT  A hospital stay is usually necessary to  lessen the inflammation of your gallbladder. You may be required to not eat or drink (fast) for a certain amount of time. You may be given medicine to treat pain or an antibiotic medicine to treat an infection. Surgery may be needed to remove your gallbladder (cholecystectomy) once the inflammation has gone down. Surgery may be needed right away if you develop complications such as death of gallbladder tissue (gangrene) or a tear (perforation) of the gallbladder.  [place] care will depend on your treatment. In general:  If you were given antibiotics, take them as directed. Finish them even if you start to feel better.  Only take over-the-counter or prescription medicines for pain, discomfort, or fever as directed by your caregiver.  Follow a low-fat diet until you see your caregiver again.  Keep all follow-up visits as directed by your caregiver. SEEK IMMEDIATE MEDICAL CARE IF:   Your pain is increasing and not controlled by medicines.  Your pain moves to another part of your abdomen or to your back.  You have a fever.  You have nausea and vomiting. MAKE SURE YOU:  Understand these instructions.  Will watch your condition.  Will get help right away if you are not doing well or get worse. Document Released: 01/14/2005 Document Revised: 04/08/2011 Document Reviewed: 11/30/2010 [facility] Patient Information 2015 [place], [place]. This information is not intended to replace advice given to you by your health care Maryna Yeagle. Make sure you discuss any questions you have with your health care Jalayia Bagheri.   LAPAROSCOPIC SURGERY: POST OP INSTRUCTIONS  1. DIET: Follow a light bland diet the  first 24 hours after arrival home, such as soup, liquids, crackers, etc.  Be sure to include lots of fluids daily.  Avoid fast food or heavy meals as your are more likely to get nauseated.  Eat a low fat the next few days after surgery.   2. Take your usually prescribed home medications  unless otherwise directed. 3. PAIN CONTROL: a. Pain is best controlled by a usual combination of three different methods TOGETHER: i. Ice/Heat ii. Over the counter pain medication iii. Prescription pain medication b. Most patients will experience some swelling and bruising around the incisions.  Ice packs or heating pads (30-60 minutes up to 6 times a day) will help. Use ice for the first few days to help decrease swelling and bruising, then switch to heat to help relax tight/sore spots and speed recovery.  Some people prefer to use ice alone, heat alone, alternating between ice & heat.  Experiment to what works for you.  Swelling and bruising can take several weeks to resolve.   c. It is helpful to take an over-the-counter pain medication regularly for the first few weeks.  Choose one of the following that works best for you: i. Naproxen (Aleve, etc)  Two 220mg  tabs twice a day ii. Ibuprofen (Advil, etc) Three 200mg  tabs four times a day (every meal & bedtime) iii. Acetaminophen (Tylenol, etc) 500-650mg  four times a day (every meal & bedtime) d. A  prescription for pain medication (such as oxycodone, hydrocodone, etc) should be given to you upon discharge.  Take your pain medication as prescribed.  i. If you are having problems/concerns with the prescription medicine (does not control pain, nausea, vomiting, rash, itching, etc), please call us [phone] to see if we need to switch you to a different pain medicine that will work better for you and/or control your side effect better. ii. If you need a refill on your pain medication, please contact your pharmacy.  They will contact our office to request authorization. Prescriptions will not be filled after 5 pm or on week-ends. 4. Avoid getting constipated.  Between the surgery and the pain medications, it is common to experience some constipation.  Increasing fluid intake and taking a fiber supplement (such as Metamucil, Citrucel, FiberCon,  MiraLax, etc) 1-2 times a day regularly will usually help prevent this problem from occurring.  A mild laxative (prune juice, Milk of Magnesia, MiraLax, etc) should be taken according to package directions if there are no bowel movements after 48 hours.   5. Watch out for diarrhea.  If you have many loose bowel movements, simplify your diet to bland foods & liquids for a few days.  Stop any stool softeners and decrease your fiber supplement.  Switching to mild anti-diarrheal medications (Kayopectate, Pepto Bismol) can help.  If this worsens or does not improve, please call us. 6. Wash / shower every day.  You may shower over the dressings as they are waterproof.  Continue to shower over incision(s) after the dressing is off. 7. Remove your waterproof bandages 5 days after surgery.  You may leave the incision open to air.  You may replace a dressing/Band-Aid to cover the incision for comfort if you wish.  8. ACTIVITIES as tolerated:   a. You may resume regular (light) daily activities beginning the next day-such as daily self-care, walking, climbing stairs-gradually increasing activities as tolerated.  If you can walk 30 minutes without difficulty, it is safe to try more intense activity such as jogging, treadmill, bicycling, low-impact  aerobics, swimming, etc. b. Save the most intensive and strenuous activity for last such as sit-ups, heavy lifting, contact sports, etc  Refrain from any heavy lifting or straining until you are off narcotics for pain control.   c. DO NOT PUSH THROUGH PAIN.  Let pain be your guide: If it hurts to do something, don't do it.  Pain is your body warning you to avoid that activity for another week until the pain goes down. d. You may drive when you are no longer taking prescription pain medication, you can comfortably wear a seatbelt, and you can safely maneuver your car and apply brakes. e. Dennis Bast may have sexual intercourse when it is comfortable.  9. FOLLOW UP in our  office a. Please call [facility] at [phone] to set up an appointment to see your surgeon in the office for a follow-up appointment approximately 2-3 weeks after your surgery. b. Make sure that you call for this appointment the day you arrive home to insure a convenient appointment time. 10. IF YOU HAVE DISABILITY OR FAMILY LEAVE FORMS, BRING THEM TO THE OFFICE FOR PROCESSING.  DO NOT GIVE THEM TO YOUR DOCTOR.   WHEN TO CALL us [phone]: 1. Poor pain control 2. Reactions / problems with new medications (rash/itching, nausea, etc)  3. Fever over 101.5 F (38.5 C) 4. Inability to urinate 5. Nausea and/or vomiting 6. Worsening swelling or bruising 7. Continued bleeding from incision. 8. Increased pain, redness, or drainage from the incision   The clinic staff is available to answer your questions during regular business hours (8:30am-5pm).  Please don't hesitate to call and ask to speak to one of our nurses for clinical concerns.   If you have a medical emergency, go to the nearest emergency room or call 911.  A surgeon from [facility] is always on call at the [facility], [place], [place], [place], [place]  [address] ? MAIN: [phone] ? TOLL FREE: [phone] ?  FAX [phone] [email or web address]  STOP SMOKING!  We strongly recommend that you stop smoking.  Smoking increases the risk of surgery including infection in the form of an open wound, pus formation, abscess, hernia at an incision on the abdomen, etc.  You have an increased risk of other MAJOR complications such as stroke, heart attack, forming clots in the leg and/or lungs, and death.    Smoking Cessation Quitting smoking is important to your health and has many advantages. However, it is not always easy to quit since nicotine is a very addictive drug. Often times, people try 3 times or more before being able to quit. This document explains the  best ways for you to prepare to quit smoking. Quitting takes hard work and a lot of effort, but you can do it. ADVANTAGES OF QUITTING SMOKING  You will live longer, feel better, and live better.  Your body will feel the impact of quitting smoking almost immediately.  Within 20 minutes, blood pressure decreases. Your pulse returns to its normal level.  After 8 hours, carbon monoxide levels in the blood return to normal. Your oxygen level increases.  After 24 hours, the chance of having a heart attack starts to decrease. Your breath, hair, and body stop smelling like smoke.  After 48 hours, damaged nerve endings begin to recover. Your sense of taste and smell improve.  After 72 hours, the body is virtually free of nicotine. Your bronchial tubes relax and breathing becomes  easier.  After 2 to 12 weeks, lungs can hold more air. Exercise becomes easier and circulation improves.  The risk of having a heart attack, stroke, cancer, or lung disease is greatly reduced.  After 1 year, the risk of coronary heart disease is cut in half.  After 5 years, the risk of stroke falls to the same as a nonsmoker.  After 10 years, the risk of lung cancer is cut in half and the risk of other cancers decreases significantly.  After 15 years, the risk of coronary heart disease drops, usually to the level of a nonsmoker.  If you are pregnant, quitting smoking will improve your chances of having a healthy baby.  The people you live with, especially any children, will be healthier.  You will have extra money to spend on things other than cigarettes. QUESTIONS TO THINK ABOUT BEFORE ATTEMPTING TO QUIT You may want to talk about your answers with your caregiver.  Why do you want to quit?  If you tried to quit in the past, what helped and what did not?  What will be the most difficult situations for you after you quit? How will you plan to handle them?  Who can help you through the tough times? Your  family? Friends? A caregiver?  What pleasures do you get from smoking? What ways can you still get pleasure if you quit? Here are some questions to ask your caregiver:  How can you help me to be successful at quitting?  What medicine do you think would be best for me and how should I take it?  What should I do if I need more help?  What is smoking withdrawal like? How can I get information on withdrawal? GET READY  Set a quit date.  Change your environment by getting rid of all cigarettes, ashtrays, matches, and lighters in your home, car, or work. Do not let people smoke in your home.  Review your past attempts to quit. Think about what worked and what did not. GET SUPPORT AND ENCOURAGEMENT You have a better chance of being successful if you have help. You can get support in many ways.  Tell your family, friends, and co-workers that you are going to quit and need their support. Ask them not to smoke around you.  Get individual, group, or telephone counseling and support. Programs are available at [employer] and health centers. Call your local health department for information about programs in your area.  Spiritual beliefs and practices may help some smokers quit.  Download a "quit meter" on your computer to keep track of quit statistics, such as how long you have gone without smoking, cigarettes not smoked, and money saved.  Get a self-help book about quitting smoking and staying off of tobacco. [place] yourself from urges to smoke. Talk to someone, go for a walk, or occupy your time with a task.  Change your normal routine. Take a different route to work. Drink tea instead of coffee. Eat breakfast in a different place.  Reduce your stress. Take a hot bath, exercise, or read a book.  Plan something enjoyable to do every day. Reward yourself for not smoking.  Explore interactive web-based programs that specialize in helping you  quit. GET MEDICINE AND USE IT CORRECTLY Medicines can help you stop smoking and decrease the urge to smoke. Combining medicine with the above behavioral methods and support can greatly increase your chances of successfully quitting smoking.  Nicotine replacement  therapy helps deliver nicotine to your body without the negative effects and risks of smoking. Nicotine replacement therapy includes nicotine gum, lozenges, inhalers, nasal sprays, and skin patches. Some may be available over-the-counter and others require a prescription.  Antidepressant medicine helps people abstain from smoking, but how this works is unknown. This medicine is available by prescription.  Nicotinic receptor partial agonist medicine simulates the effect of nicotine in your brain. This medicine is available by prescription. Ask your caregiver for advice about which medicines to use and how to use them based on your health history. Your caregiver will tell you what side effects to look out for if you choose to be on a medicine or therapy. Carefully read the information on the package. Do not use any other product containing nicotine while using a nicotine replacement product.  RELAPSE OR DIFFICULT SITUATIONS Most relapses occur within the first 3 months after quitting. Do not be discouraged if you start smoking again. Remember, most people try several times before finally quitting. You may have symptoms of withdrawal because your body is used to nicotine. You may crave cigarettes, be irritable, feel very hungry, cough often, get headaches, or have difficulty concentrating. The withdrawal symptoms are only temporary. They are strongest when you first quit, but they will go away within 10 14 days. To reduce the chances of relapse, try to:  Avoid drinking alcohol. Drinking lowers your chances of successfully quitting.  Reduce the amount of caffeine you consume. Once you quit smoking, the amount of caffeine in your body increases  and can give you symptoms, such as a rapid heartbeat, sweating, and anxiety.  Avoid smokers because they can make you want to smoke.  Do not let weight gain distract you. Many smokers will gain weight when they quit, usually less than 10 pounds. Eat a healthy diet and stay active. You can always lose the weight gained after you quit.  Find ways to improve your mood other than smoking. FOR MORE INFORMATION  www.smokefree.gov    While it can be one of the most difficult things to do, the [place] community has programs to help you stop.  Consider talking with your primary care physician about options.  Also, Smoking Cessation classes are available through the [facility]:  The smoking cessation program is a proven-effective program from the American Lung Association. The program is available for anyone 78 and older who currently smokes. The program lasts for 7 weeks and is 8 sessions. Each class will be approximately 1 1/2 hours. The program is every Tuesday.  All classes are 12-1:30pm and same location.  Event Location Information:  Location: [place] 2nd Floor Conference Room 2-037; located next to [facility] cross streets: [place] Entrance into the [facility] is adjacent to the [employer] main entrance. The conference room is located on the 2nd floor.  Parking Instructions: Visitor parking is adjacent to [employer] main entrance and the [place]    A smoking cessation program is also offered through the [facility]. Register online at ClickDebate.gl or call [phone] for more information.   Tobacco cessation counseling is available at [facility]. Call [phone] for a free appointment.   Tobacco cessation classes also are available through the [facility] in [place]. For information, call [phone].   The Patient  Education Network features videos on tobacco cessation. Please consult your listings in  the center of this book to find instructions on how to access this resource.   If you want more information, ask your nurse.      Intermittent Claudication Blockage of leg arteries results from poor circulation of blood in the leg arteries. This produces an aching, tired, and sometimes burning pain in the legs that is brought on by exercise and made better by rest. Claudication refers to the limping that happens from leg cramps. It is also referred to as Vaso-occlusive disease of the legs, arterial insufficiency of the legs, recurrent leg pain, recurrent leg cramping and calf pain with exercise.  CAUSES  This condition is due to narrowing or blockage of the arteries (muscular vessels which carry blood away from the heart and around the body). Blockage of arteries can occur anywhere in the body. If they occur in the heart, a person may experience angina (chest pain) or even a heart attack. If they occur in the neck or the brain, a person may have a stroke. Intermittent claudication is when the blockage occurs in the legs, most commonly in the calf or the foot.  Atherosclerosis, or blockage of arteries, can occur for many reasons. Some of these are smoking, diabetes, and high cholesterol. SYMPTOMS  Intermittent claudication may occur in both legs, and it often continues to get worse over time. However, some people complain only of weakness in the legs when walking, or a feeling of "tiredness" in the buttocks. Impotence (not able to have an erection) is an occasional complaint in men. Pain while resting is uncommon.  WHAT TO EXPECT AT [facility] OFFICE: Your medical history will be asked for and a physical examination will be performed. Medical history questions documenting claudication in detail may include:   Time pattern  Do you have leg cramps at night (nocturnal cramps)?  How often does  leg pain with cramping occur?  Is it getting worse?  What is the quality of the pain?  Is the pain sharp?  Is there an aching pain with the cramps?  Aggravating factors  Is it worse after you exercise?  Is it worse after you are standing for a while?  Do you smoke? How much?  Do you drink alcohol? How much?  Are you diabetic? How well is your blood sugar controlled?  Other  What other symptoms are also present?  Has there been impotence (men)?  Is there pain in the back?  Is there a darkening of the skin of the legs, feet or toes?  Is there weakness or paralysis of the legs? The physical examination may include evaluation of the femoral pulse (in the groin) and the other areas where the pulse can be felt in the legs. DIAGNOSIS  Diagnostic tests that may be performed include:  Blood pressure measured in arms and legs for comparison.  Doppler ultrasonography on the legs and the heart.  Duplex Doppler/ultrasound exam of extremity to visualize arterial blood flow.  ECG- to evaluate the activity of your heart.  Aortography- to visualize blockages in your arteries. TREATMENT Surgical treatment may be suggested if claudication interferes with the patient's activities or work, and if the diseased arteries do not seem to be improving after treatment. Be aware that this condition can worsen over time and you should carefully monitor your condition. HOME CARE INSTRUCTIONS  Talk to your caregiver about the cause of your leg cramping and about what to do at home to relieve it.  A healthy diet is important to  lessen the likeliness of atherosclerosis.  A program of daily walking for short periods, and stopping for pain or cramping, may help improve function.  It is important to stop smoking.  Avoid putting hot or cold items on legs.  Avoid tight shoes. SEEK MEDICAL CARE IF: There are many other causes of leg pain such as arthritis or low blood potassium. However, some  causes of leg pain may be life threatening such as a blood clot in the legs. Seek medical attention if you have:  Leg pain that does not go away.  Legs that may be red, hot or swollen.  Ulcers or sores appear on your ankle or foot.  Any chest pain or shortness of breath accompanying leg pain.  Diabetes.  You are pregnant. SEEK IMMEDIATE MEDICAL CARE IF:   Your leg pain becomes severe or will not go away.  Your foot turns blue or a dark color.  Your leg becomes red, hot or swollen or you develop a fever over 102F.  Any chest pain or shortness of breath accompanying leg pain. MAKE SURE YOU:   Understand these instructions.  Will watch your condition.  Will get help right away if you are not doing well or get worse. Document Released: 11/17/2003 Document Revised: 04/08/2011 Document Reviewed: 09/04/2007 [facility] Patient Information 2015 [place], [place]. This information is not intended to replace advice given to you by your health care Yarenis Cerino. Make sure you discuss any questions you have with your health care [place].

## 2013-07-26 ENCOUNTER — Telehealth: Payer: Self-pay | Admitting: Family Medicine

## 2013-07-26 NOTE — Telephone Encounter (Signed)
Please call again later

## 2013-07-26 NOTE — Telephone Encounter (Addendum)
Hx. Asthma, COPD mixed type, and Lung Cancer  C/O: Continues to experience cough, some shortness of breath and chest pain that worsens whenever he takes a deep breath or yawns.  Rates pain 5/6.  States he was recently seen (on 07/07/13) and diagnosed with "pneumonia", was given antibiotics and cough medication, and states that he's feeling somewhat better, but it still hurts whenever he takes a deep breath.  States he has taken all of his antibiotics and cough medication and is currently out.  States he experiences chest tightness occasionally, but not all the time. Denies chest tightness at this time.  Using Pro-Air inhaler as needed with minimal relief.  He denies chest pressure, n/v, abdominal pain, pain radiating down arms, dizziness or lightheadedness, and blood in sputum.    Advice:  An appointment with Dr. Etter Sjogren and Einar Pheasant was offered for today, but patient states that he does not have transportation to bring him to the appointment today.  He has an appointment scheduled with Dr. Melvyn Novas, his Pulmonologist, on Thursday, 07/29/12 @ 10:15am and patient states that he will wait until then.  Pt was instructed to drink plenty of fluids, continue to use Pro Air as needed, pick up more antitussive medication, to call us as needed, and to call EMS if his symptoms worsen before his appointment on Thursday.  Pt stated understanding and agreed.

## 2013-07-26 NOTE — Telephone Encounter (Signed)
Patient Information:  Caller Name: Jefte  Phone: [phone]  Patient: [name], [name]  Gender: Male  DOB: [birth year]  Age: 68 Years  PCP: Rosalita Chessman.  Office Follow Up:  Does the office need to follow up with this patient?: Yes  Instructions For The Office: Calling about chest pain. Was advised by CSR to call 911 but pt refused. RN got disconnected from pt before starting triage and attempted to call back without success. Left message on voicemail to call office back.  RN Note:  Got disconnected from pt prior to triage--> left message on voicemail to call office back.  Symptoms  Reason For Call & Symptoms: Calling about chest pain. Was advised by CSR to call 911 but pt refused. Got disconnected from pt before starting triage and attempted to call back without success. Left message on voicemail to call office back.  Reviewed Health History In EMR: No  Reviewed Medications In EMR: No  Reviewed Allergies In EMR: No  Reviewed Surgeries / Procedures: No  Date of Onset of Symptoms: 07/26/2013  Guideline(s) Used:  No Protocol Available - Sick Adult  Disposition Per Guideline:   Callback by PCP or Subspecialist within 1 Hour  Reason For Disposition Reached:   Nursing judgment  Advice Given:  N/A  RN Overrode Recommendation:  Document Patient  got disconnected from pt prior to triage and not able to get back in touch with him, left message on voicemail to call office back

## 2013-07-26 NOTE — Telephone Encounter (Signed)
Left message on voice mail for the patient to return my call regarding CP

## 2013-07-26 NOTE — Telephone Encounter (Signed)
Caller name: Ricard  Call back number:[phone]   Reason for call:   Pt called in with chest pains; routed to live person at [facility] but was on hold for 3 minutes.  Routed with office RN.

## 2013-07-26 NOTE — Telephone Encounter (Signed)
Spoke with patient who stated that he feels his chest pain  Is related to his pneumonia and not his heart. Patient denies jaw, arm or neck pain. No nausea, vomiting or diaphoresis. Patient declined ER and UC, stated that he called Dr. Melvyn Novas and has appt on Thur. I strongly advised him to seek treatment in ER if pain becomes severe and does not go away.He agrees to do that.

## 2013-07-29 ENCOUNTER — Other Ambulatory Visit (INDEPENDENT_AMBULATORY_CARE_PROVIDER_SITE_OTHER): Payer: Medicare Other

## 2013-07-29 ENCOUNTER — Ambulatory Visit (INDEPENDENT_AMBULATORY_CARE_PROVIDER_SITE_OTHER)
Admission: RE | Admit: 2013-07-29 | Discharge: 2013-07-29 | Disposition: A | Discharge status: Home / Self Care | Payer: Medicare Other | Source: Ambulatory Visit | Attending: Internal Medicine | Admitting: Internal Medicine

## 2013-07-29 ENCOUNTER — Encounter: Payer: Self-pay | Admitting: Internal Medicine

## 2013-07-29 ENCOUNTER — Ambulatory Visit (INDEPENDENT_AMBULATORY_CARE_PROVIDER_SITE_OTHER): Payer: Medicare Other | Admitting: Internal Medicine

## 2013-07-29 VITALS — BP 112/74 | HR 78 | Temp 98.7°F | Ht 66.5 in | Wt 123.0 lb

## 2013-07-29 DIAGNOSIS — R918 Other nonspecific abnormal finding of lung field: Secondary | ICD-10-CM

## 2013-07-29 DIAGNOSIS — J45909 Unspecified asthma, uncomplicated: Secondary | ICD-10-CM

## 2013-07-29 DIAGNOSIS — J4520 Mild intermittent asthma, uncomplicated: Secondary | ICD-10-CM

## 2013-07-29 DIAGNOSIS — F17200 Nicotine dependence, unspecified, uncomplicated: Secondary | ICD-10-CM

## 2013-07-29 LAB — BASIC METABOLIC PANEL
BUN: 22 mg/dL (ref 6–23)
CO2: 28 mEq/L (ref 19–32)
Calcium: 9.7 mg/dL (ref 8.4–10.5)
Chloride: 100 mEq/L (ref 96–112)
Creatinine, Ser: 1.7 mg/dL — ABNORMAL HIGH (ref 0.4–1.5)
GFR: 53.25 mL/min — ABNORMAL LOW (ref >60.00)
GLUCOSE: 99 mg/dL (ref 70–99)
Potassium: 4.3 mEq/L (ref 3.5–5.1)
Sodium: 135 mEq/L (ref 135–145)

## 2013-07-29 LAB — CBC WITH DIFFERENTIAL/PLATELET
BASOS ABS: 0.1 10*3/uL (ref 0.0–0.1)
Basophils Relative: 0.4 % (ref 0.0–3.0)
EOS ABS: 4.4 10*3/uL — AB (ref 0.0–0.7)
Eosinophils Relative: 35.1 % — ABNORMAL HIGH (ref 0.0–5.0)
HEMATOCRIT: 35.9 % — AB (ref 39.0–52.0)
Hemoglobin: 12.4 g/dL — ABNORMAL LOW (ref 13.0–17.0)
LYMPHS ABS: 2 10*3/uL (ref 0.7–4.0)
Lymphocytes Relative: 15.7 % (ref 12.0–46.0)
MCHC: 34.4 g/dL (ref 30.0–36.0)
MCV: 83.7 fl (ref 78.0–100.0)
MONO ABS: 0.8 10*3/uL (ref 0.1–1.0)
MONOS PCT: 6.2 % (ref 3.0–12.0)
Neutro Abs: 5.3 10*3/uL (ref 1.4–7.7)
Neutrophils Relative %: 42.6 % — ABNORMAL LOW (ref 43.0–77.0)
PLATELETS: 338 10*3/uL (ref 150.0–400.0)
RBC: 4.29 Mil/uL (ref 4.22–5.81)
RDW: 16.2 % — AB (ref 11.5–15.5)
WBC: 12.5 10*3/uL — ABNORMAL HIGH (ref 4.0–10.5)

## 2013-07-29 LAB — SEDIMENTATION RATE: Sed Rate: 112 mm/hr — ABNORMAL HIGH (ref 0–22)

## 2013-07-29 NOTE — Progress Notes (Signed)
Subjective:    Patient ID: [name] [name], male    DOB: 20-Dec-1945   MRN: [id number]    Brief patient profile:  67 yobm active smoker sp vats RULobectromy for [facility] 2008  and no adjuvant therapy with onset of doe about the same time of his surgery with increased need for albuterol starting after surgery referred to pulmonary  10/28/2012  by Dr Hilarie Fredrickson (whom  he saw for chronic pancreatitis)  for copd eval   History of Present Illness  10/28/2012 1st [facility] office visit/ [name] [name] still smoking cc doe x 5 years slow progressive variably across the room and on a good day walk flat like at the mall but no hills/ hurry even on best days, assoc with some nasal congestion but no purulent secretions rec Only use your albuterol (proaire)as a rescue medication to be used if you can't catch your breath by resting or doing a relaxed purse lip breathing pattern.  - The less you use it, the better it will work when you need it. - Ok to use up to every 4 hours if you must but call for immediate appointment if use goes up over your usual need - Don't leave home without it !!  (think of it like your spare tire for your car)    The key is to stop smoking completely before smoking completely stops you!  Please schedule a follow up office visit in 6 weeks, call sooner if needed with pfts    12/09/2012 f/u ov/Oniya Mandarino re: copd vs asthma  Chief Complaint  Patient presents with  . Followup with PFT    Pt states that his SOB is unchanged- still using proair qid whether he needs it or not.    sometimes sob "just standing up", sometimes not unless goes up hills Not typically having any  noct spells Comes into office s am saba (none x 12 h) with nl pfts but feeling his usual symptoms s it and says would have already used it if we had not asked him to hold off. rec Only use your albuterol (proaire)  As rescue The key is to stop smoking completely before smoking completely stops you!  Pulmonary follow up is as  needed   07/29/2013 f/u ov/Heather Streeper re: p rx for Biaxin 07/07/13 for ? LUL pna Chief Complaint  Patient presents with  . Follow-up    Pt last seen 12/09/12. He states that he was dxed with pneumonitis approx 1 month ago and is having increased SOB since then. Using rescue inhaler 2-3 times per day.  He states that he is SOB with or w/o any exertion. He has prod cough with moderate amounts of sputum-? color. Pt concerned about unintended wt loss that started about 6 wks ago.   No saba since 12 h prior to OV   Did not bring saba with him  No obvious pattern in day to day or daytime variabilty or assoc chronic cough or cp or chest tightness, subjective wheeze overt sinus or hb symptoms. No unusual exp hx or h/o childhood pna/ asthma or knowledge of premature birth.  Sleeping ok without nocturnal  or early am exacerbation  of respiratory  c/o's or need for noct saba. Also denies any obvious fluctuation of symptoms with weather or environmental changes or other aggravating or alleviating factors except as outlined above   Current Medications, Allergies, Complete Past Medical History, Past Surgical History, Family History, and Social History were reviewed in [employer] record.  ROS  The following are not active complaints unless bolded sore throat, dysphagia, dental problems, itching, sneezing,  nasal congestion or excess/ purulent secretions, ear ache,   fever, chills, sweats, unintended wt loss, pleuritic or exertional cp, hemoptysis,  orthopnea pnd or leg swelling, presyncope, palpitations, heartburn, abdominal pain, anorexia, nausea, vomiting, diarrhea  or change in bowel or urinary habits, change in stools or urine, dysuria,hematuria,  rash, arthralgias, visual complaints, headache, numbness weakness or ataxia or problems with walking or coordination,  change in mood/affect or memory.                        Objective:   Physical Exam  07/29/2013         123  Wt  Readings from Last 3 Encounters:  12/09/12 126 lb (57.153 kg)  10/28/12 127 lb 6.4 oz (57.788 kg)  10/20/12 127 lb (57.607 kg)        amb bm nad  With unusual affect, very limited insight into medical care/ hopeless helpless   HEENT: nl dentition, turbinates, and orophanx. Nl external ear canals without cough reflex   NECK :  without JVD/Nodes/TM/ nl carotid upstrokes bilaterally   LUNGS: no acc muscle use, clear to A and P bilaterally without cough on insp or exp maneuvers   CV:  RRR  no s3 or murmur or increase in P2, no edema   ABD:  soft and nontender with nl excursion in the supine position. No bruits or organomegaly, bowel sounds nl  MS:  warm without deformities, calf tenderness, cyanosis or clubbing  SKIN: warm and dry without lesions         Recent Labs Lab 07/29/13 1108  NA 135  K 4.3  CL 100  CO2 28  BUN 22  CREATININE 1.7*  GLUCOSE 99    Recent Labs Lab 07/29/13 1108  HGB 12.4*  HCT 35.9*  WBC 12.5 Repeated and verified X2.*  PLT 338.0        Lab Results  Component Value Date   ESRSEDRATE 112* 07/29/2013     cxr 07/29/2013  C/w cavitary dz LUL     Assessment & Plan:

## 2013-07-29 NOTE — Assessment & Plan Note (Signed)

## 2013-07-29 NOTE — Assessment & Plan Note (Signed)
-   PFT's wnl x dlco 51% 12/09/2012   Adequate control on present rx, reviewed > no change in rx needed  (prn saba)

## 2013-07-29 NOTE — Patient Instructions (Addendum)
Please remember to go to the lab and x-ray department downstairs for your tests - we will call you with the results when they are available.

## 2013-07-29 NOTE — Progress Notes (Signed)
Quick Note:  Spoke with pt and notified of results per Dr. Melvyn Novas. Pt verbalized understanding. He states "I might not be able to stay home, I have things to do". 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 He states "I will try". I spoke with the [place] Adline Potter) and notified them of the pt's response to me. ______

## 2013-07-29 NOTE — Progress Notes (Signed)
Quick Note:  Spoke with pt and notified of results per Dr. Melvyn Novas. Pt verbalized understanding. He states "I might not be able to stay home, I have things to do". 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 He states "I will try". I spoke with the [place] Adline Potter) and notified them of the pt's response to me.  ______

## 2013-07-29 NOTE — Progress Notes (Signed)
Quick Note:  Notes Recorded by Rosana Berger, CMA on 07/29/2013 at 2:28 PM Per MW- HD already notified and will call the pt  I called him also and LMOMTCB  I called the number listed for his daughter and NA, unable to leave a msg  ______

## 2013-07-29 NOTE — Progress Notes (Signed)
Quick Note:  Per MW- HD already notified and will call the pt  I called him also and LMOMTCB  I called the number listed for his daughter and NA, unable to leave a msg ______

## 2013-07-29 NOTE — Progress Notes (Signed)
Quick Note:  Notes Recorded by Rosana Berger, CMA on 07/29/2013 at 2:28 PM Per MW- HD already notified and will call the pt  I called him also and LMOMTCB  I called the number listed for his daughter and NA, unable to leave a msg ______

## 2013-07-30 NOTE — Assessment & Plan Note (Signed)
-  first noted LUL changes  07/07/13 > rx biaxin > cavitary changes 07/29/2013 > referred to HD - 07/29/2013  [facility]  2 laps @ 185 ft each stopped due to  fatigue no desat  - ESR 112  07/29/2013 with quantiferon Gold >>>  This is M TB until proven o/w  rec Stay home Await HD review and eval/rx

## 2013-08-02 ENCOUNTER — Telehealth: Payer: Self-pay | Admitting: Internal Medicine

## 2013-08-02 ENCOUNTER — Encounter: Payer: Self-pay | Admitting: Internal Medicine

## 2013-08-02 NOTE — Telephone Encounter (Signed)
Called, spoke with pt - Pt states on Thursday he was advised the Health Department would be contacting him.  Pt states, "I have sat around this house all weekend and haven't heard from them.  I'm not sitting around house waiting anymore."   Advised pt I would check on the status of this and turn around time of Health Department to contact him and would call him back today with an update.  Rec to pt continue to follow MW's recs to stay home and await Health Department call.    [place] at [phone], spoke with Raquel Sarna.  Was advised everyone except her is out of the office today. States she is unsure if pt was contacted last week or over the weekend bc of this.  Earley Brooke of above and informed her of pt's response.  Raquel Sarna states the best she can do at this point is to have someone contact the pt tomorrow.  Raquel Sarna states someone will contact him by 9am tomorrow.  If he hasn't received a call by this time, he can call [phone].  Called, spoke with pt.  Informed him of above per Raquel Sarna and advised he should receive a call by 9 am tomorrow.  I have provided him with the phone # to call incase he doesn't receive this phone #.  I, once again, reiterated to pt that he needs to continue to stay home until he is contacted by the HD and they provide further recs.  Pt states "I need my bills paid.  I don't have anyone to do things for me.  I'll put something around my face."  I educated pt that putting something around face is not proper precautions and therefore, highly advise against it.  Highly rec he stay at home to prevent from infecting others if indeed is active TB.  Pt response was, "I'll see."    Will forward to Dr. Melvyn Novas as Juluis Rainier.

## 2013-08-02 NOTE — Telephone Encounter (Signed)
Raquel Sarna w/ the HD calling again.  She stated that pt has not been contacted because they have been waiting on records and have yet to receive anything.  Earley Brooke would go ahead and take care of this >> fax to [phone], with TB written on the cover sheet.  Also asked Raquel Sarna if pt would still be contacted by tomorrow morning at [place] stated that it would still be tomorrow, but the 9am was before she found that they had not received any records.  Raquel Sarna stated that pt should be contacted by lunch-time tomorrow - informed Raquel Sarna that when Crystal spoke with the patient earlier today, he threatened to leave the house even though he was cautioned against it b/c he does not have the proper PPE.  Also, advised Raquel Sarna that pt did not have his Quantiferon done (was not placed future in epic therefore not drawn by the lab) and requested the HD take this over - Raquel Sarna stated they would do a TB skin test.  Called spoke with patient, apologized for any incovenience and advised that everything is being taken care of and he will be contacted tomorrow by lunch-time.  Pt stated that he needs to "leave the house because it's driving me crazy; I'm not a house person."  As Crystal did earlier, encouraged pt to stay within the home as recommended by MW to limit exposure and advised pt that he does not have the proper masks to leave his home.  Pt then stated that he "has bills to pay" and needs to leave to do this.  Asked pt to please wait for the HD to contact him and to call our office if he does not hear from them tomorrow morning. Pt voiced his understanding.  Records faxed to the above number, with a message on the fax cover sheet about pt leaving the home if he doesn't hear anything from the HD tomorrow morning.  Nothing further needed at this time; will sign off.  **NOTE: Magda Paganini reports that she did fax ov notes, labs and cxr to the HD last week when the initial call was made to the HD.

## 2013-08-03 ENCOUNTER — Telehealth: Payer: Self-pay | Admitting: Internal Medicine

## 2013-08-03 NOTE — Telephone Encounter (Signed)
Pt's daughter [name] [name] is on file to speak with regarding pt's care Called the HD and spoke with Adline Potter, the TB nurse on call today.  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  Called spoke with [name] [name] and advised her that the HD should be at pt's home now.  [name] [name] asked if pt does indeed have TB and I advised her that it is possible but the HD will be making the final decision.  She will attempt to call her father while the HD is at his home to ask any additional or specific questions.  Nothing further needed; will sign off.

## 2013-08-05 ENCOUNTER — Telehealth: Payer: Self-pay | Admitting: Family Medicine

## 2013-08-05 NOTE — Telephone Encounter (Signed)
Caller name:Otha Relation to AF:[id number] Call back number: [phone] Pharmacy:  Reason for call: Patient called demanding to speak to Dr Etter Sjogren assistant . Patient would not give me any further details. Please advise.

## 2013-08-06 NOTE — Telephone Encounter (Signed)
Spoke with patient this morning who states that he is very confused because he has Dr Etter Sjogren advising him one direction and pulmonary another. He does not know which one to believe. Advised patient that the specialists that he recently saw has the more current data on his condition. Advised that he should follow the recommendations of Dr Melvyn Novas until the Health Dept test are resulted. Patient verbalizes understanding.   Advised Clinical Manager of current status.

## 2013-08-11 ENCOUNTER — Telehealth: Payer: Self-pay | Admitting: Internal Medicine

## 2013-08-11 MED ORDER — LEVOFLOXACIN 500 MG PO TABS
500.0000 mg | ORAL_TABLET | Freq: Every day | ORAL | Status: DC
Start: 2013-08-11 — End: 2013-08-26

## 2013-08-11 NOTE — Telephone Encounter (Signed)
Called and spoke to pt. Pt stated that we should not be spreading around to everyone that he has TB. Pt stated we were to stupid to have told the health dept that he had TB. I informed the pt we were taking precautions in case it was active TB. Pt stated he was having a productive cough and was unsure of the color, pt is also c/o DOE and left sided CP. Pt was very vague on symptoms. Pt asked to have an appt with Dr. Melvyn Novas next week. I informed the pt that I could get his message over to [facility] and it may not be necessary for pt to come in. Pt was very confrontational. Pt then hung up on me.   Spoke with Magda Paganini and the message was verbally forwarded to [facility]. Per MW--Levaquin 500mg  daily for 7 days.  Called and spoke to pt. Informed pt that MW wants to call in an abx. Verified pharmacy. Informed pt that he should still try and remain indoors for precautions. Pt stated the health dept cleared pt from TB after reading pt's PPD skin test. Pt denied any further questions or concerns at this time.

## 2013-08-26 ENCOUNTER — Ambulatory Visit (INDEPENDENT_AMBULATORY_CARE_PROVIDER_SITE_OTHER): Payer: Medicare Other | Admitting: Family Medicine

## 2013-08-26 ENCOUNTER — Encounter: Payer: Self-pay | Admitting: Family Medicine

## 2013-08-26 VITALS — BP 106/70 | HR 73 | Temp 97.9°F | Wt 123.0 lb

## 2013-08-26 DIAGNOSIS — R05 Cough: Secondary | ICD-10-CM

## 2013-08-26 DIAGNOSIS — R059 Cough, unspecified: Secondary | ICD-10-CM

## 2013-08-26 DIAGNOSIS — G894 Chronic pain syndrome: Secondary | ICD-10-CM

## 2013-08-26 DIAGNOSIS — I10 Essential (primary) hypertension: Secondary | ICD-10-CM

## 2013-08-26 DIAGNOSIS — Z7251 High risk heterosexual behavior: Secondary | ICD-10-CM

## 2013-08-26 LAB — HEPATIC FUNCTION PANEL
ALT: 8 U/L (ref 0–53)
AST: 15 U/L (ref 0–37)
Albumin: 3.4 g/dL — ABNORMAL LOW (ref 3.5–5.2)
Alkaline Phosphatase: 143 U/L — ABNORMAL HIGH (ref 39–117)
BILIRUBIN TOTAL: 0.5 mg/dL (ref 0.2–1.2)
Bilirubin, Direct: 0 mg/dL (ref 0.0–0.3)
Total Protein: 8.6 g/dL — ABNORMAL HIGH (ref 6.0–8.3)

## 2013-08-26 LAB — BASIC METABOLIC PANEL
BUN: 19 mg/dL (ref 6–23)
CALCIUM: 9.4 mg/dL (ref 8.4–10.5)
CHLORIDE: 104 meq/L (ref 96–112)
CO2: 26 mEq/L (ref 19–32)
CREATININE: 1.6 mg/dL — AB (ref 0.4–1.5)
GFR: 58.02 mL/min — ABNORMAL LOW (ref >60.00)
Glucose, Bld: 96 mg/dL (ref 70–99)
Potassium: 3.7 mEq/L (ref 3.5–5.1)
Sodium: 138 mEq/L (ref 135–145)

## 2013-08-26 LAB — LIPID PANEL
Cholesterol: 148 mg/dL (ref 0–200)
HDL: 30.9 mg/dL — ABNORMAL LOW (ref >39.00)
LDL CALC: 91 mg/dL (ref 0–99)
NonHDL: 117.1
TRIGLYCERIDES: 131 mg/dL (ref 0.0–149.0)
Total CHOL/HDL Ratio: 5
VLDL: 26.2 mg/dL (ref 0.0–40.0)

## 2013-08-26 LAB — CBC WITH DIFFERENTIAL/PLATELET
Basophils Absolute: 0.1 10*3/uL (ref 0.0–0.1)
Basophils Relative: 0.6 % (ref 0.0–3.0)
Eosinophils Absolute: 1.7 10*3/uL — ABNORMAL HIGH (ref 0.0–0.7)
Eosinophils Relative: 18.4 % — ABNORMAL HIGH (ref 0.0–5.0)
HCT: 33.7 % — ABNORMAL LOW (ref 39.0–52.0)
Hemoglobin: 11.2 g/dL — ABNORMAL LOW (ref 13.0–17.0)
LYMPHS PCT: 24.4 % (ref 12.0–46.0)
Lymphs Abs: 2.3 10*3/uL (ref 0.7–4.0)
MCHC: 33.2 g/dL (ref 30.0–36.0)
MCV: 82.2 fl (ref 78.0–100.0)
MONO ABS: 0.7 10*3/uL (ref 0.1–1.0)
Monocytes Relative: 7.4 % (ref 3.0–12.0)
Neutro Abs: 4.6 10*3/uL (ref 1.4–7.7)
Neutrophils Relative %: 49.2 % (ref 43.0–77.0)
PLATELETS: 280 10*3/uL (ref 150.0–400.0)
RBC: 4.1 Mil/uL — ABNORMAL LOW (ref 4.22–5.81)
RDW: 16.1 % — ABNORMAL HIGH (ref 11.5–15.5)
WBC: 9.3 10*3/uL (ref 4.0–10.5)

## 2013-08-26 MED ORDER — HYDROCODONE-ACETAMINOPHEN 7.5-325 MG PO TABS: 1.0000 | ORAL_TABLET | ORAL | Status: DC | PRN

## 2013-08-26 MED ORDER — GUAIFENESIN-CODEINE 100-10 MG/5ML PO SYRP: ORAL_SOLUTION | ORAL | Status: DC

## 2013-08-26 NOTE — Patient Instructions (Addendum)
Cough, Adult  A cough is a reflex that helps clear your throat and airways. It can help heal the body or may be a reaction to an irritated airway. A cough may only last 2 or 3 weeks (acute) or may last more than 8 weeks (chronic).  CAUSES Acute cough:  Viral or bacterial infections. Chronic cough:  Infections.  Allergies.  Asthma.  Post-nasal drip.  Smoking.  Heartburn or acid reflux.  Some medicines.  Chronic lung problems (COPD).  Cancer. SYMPTOMS   Cough.  Fever.  Chest pain.  Increased breathing rate.  High-pitched whistling sound when breathing (wheezing).  Colored mucus that you cough up (sputum). TREATMENT   A bacterial cough may be treated with antibiotic medicine.  A viral cough must run its course and will not respond to antibiotics.  Your caregiver may recommend other treatments if you have a chronic cough. HOME CARE INSTRUCTIONS   Only take over-the-counter or prescription medicines for pain, discomfort, or fever as directed by your caregiver. Use cough suppressants only as directed by your caregiver.  Use a cold steam vaporizer or humidifier in your bedroom or home to help loosen secretions.  Sleep in a semi-upright position if your cough is worse at night.  Rest as needed.  Stop smoking if you smoke. SEEK IMMEDIATE MEDICAL CARE IF:   You have pus in your sputum.  Your cough starts to worsen.  You cannot control your cough with suppressants and are losing sleep.  You begin coughing up blood.  You have difficulty breathing.  You develop pain which is getting worse or is uncontrolled with medicine.  You have a fever. MAKE SURE YOU:   Understand these instructions.  Will watch your condition.  Will get help right away if you are not doing well or get worse. Document Released: 07/13/2010 Document Revised: 04/08/2011 Document Reviewed: 07/13/2010 [facility] Patient Information 2015 [place], [place]. This information is not intended  to replace advice given to you by your health care provider. Make sure you discuss any questions you have with your health care provider.  Smoking Cessation, Tips for Success If you are ready to quit smoking, congratulations! You have chosen to help yourself be healthier. Cigarettes bring nicotine, tar, carbon monoxide, and other irritants into your body. Your lungs, heart, and blood vessels will be able to work better without these poisons. There are many different ways to quit smoking. Nicotine gum, nicotine patches, a nicotine inhaler, or nicotine nasal spray can help with physical craving. Hypnosis, support groups, and medicines help break the habit of smoking. WHAT THINGS CAN I DO TO MAKE QUITTING EASIER?  Here are some tips to help you quit for good:  Pick a date when you will quit smoking completely. Tell all of your friends and family about your plan to quit on that date.  Do not try to slowly cut down on the number of cigarettes you are smoking. Pick a quit date and quit smoking completely starting on that day.  Throw away all cigarettes.   Clean and remove all ashtrays from your home, work, and car.  On a card, write down your reasons for quitting. Carry the card with you and read it when you get the urge to smoke.  Cleanse your body of nicotine. Drink enough water and fluids to keep your urine clear or pale yellow. Do this after quitting to flush the nicotine from your body.  Learn to predict your moods. Do not let a bad situation be your excuse  to have a cigarette. Some situations in your life might tempt you into wanting a cigarette.  Never have "just one" cigarette. It leads to wanting another and another. Remind yourself of your decision to quit.  Change habits associated with smoking. If you smoked while driving or when feeling stressed, try other activities to replace smoking. Stand up when drinking your coffee. Brush your teeth after eating. Sit in a different chair when you  read the paper. Avoid alcohol while trying to quit, and try to drink fewer caffeinated beverages. Alcohol and caffeine may urge you to smoke.  Avoid foods and drinks that can trigger a desire to smoke, such as sugary or spicy foods and alcohol.  Ask people who smoke not to smoke around you.  Have something planned to do right after eating or having a cup of coffee. For example, plan to take a walk or exercise.  Try a relaxation exercise to calm you down and decrease your stress. Remember, you may be tense and nervous for the first 2 weeks after you quit, but this will pass.  Find new activities to keep your hands busy. Play with a pen, coin, or rubber band. Doodle or draw things on paper.  Brush your teeth right after eating. This will help cut down on the craving for the taste of tobacco after meals. You can also try mouthwash.   Use oral substitutes in place of cigarettes. Try using lemon drops, carrots, cinnamon sticks, or chewing gum. Keep them handy so they are available when you have the urge to smoke.  When you have the urge to smoke, try deep breathing.  Designate your home as a nonsmoking area.  If you are a heavy smoker, ask your health care provider about a prescription for nicotine chewing gum. It can ease your withdrawal from nicotine.  Reward yourself. Set aside the cigarette money you save and buy yourself something nice.  Look for support from others. Join a support group or smoking cessation program. Ask someone at home or at work to help you with your plan to quit smoking.  Always ask yourself, "Do I need this cigarette or is this just a reflex?" Tell yourself, "Today, I choose not to smoke," or "I do not want to smoke." You are reminding yourself of your decision to quit.  Do not replace cigarette smoking with electronic cigarettes (commonly called e-cigarettes). The safety of e-cigarettes is unknown, and some may contain harmful chemicals.  If you relapse, do not  give up! Plan ahead and think about what you will do the next time you get the urge to smoke. HOW WILL I FEEL WHEN I QUIT SMOKING? You may have symptoms of withdrawal because your body is used to nicotine (the addictive substance in cigarettes). You may crave cigarettes, be irritable, feel very hungry, cough often, get headaches, or have difficulty concentrating. The withdrawal symptoms are only temporary. They are strongest when you first quit but will go away within 10-14 days. When withdrawal symptoms occur, stay in control. Think about your reasons for quitting. Remind yourself that these are signs that your body is healing and getting used to being without cigarettes. Remember that withdrawal symptoms are easier to treat than the major diseases that smoking can cause.  Even after the withdrawal is over, expect periodic urges to smoke. However, these cravings are generally short lived and will go away whether you smoke or not. Do not smoke! WHAT RESOURCES ARE AVAILABLE TO HELP ME QUIT SMOKING? Your  health care provider can direct you to community resources or hospitals for support, which may include:  Group support.  Education.  Hypnosis.  Therapy. Document Released: 10/13/2003 Document Revised: 05/31/2013 Document Reviewed: 07/02/2012 [facility] Patient Information 2015 [place], [place]. This information is not intended to replace advice given to you by your health care provider. Make sure you discuss any questions you have with your health care provider.

## 2013-08-26 NOTE — Progress Notes (Signed)
Pre visit review using our clinic review tool, if applicable. No additional management support is needed unless otherwise documented below in the visit note. 

## 2013-08-26 NOTE — Progress Notes (Signed)
  Subjective:     [name] [name] is a 68 y.o. male here for evaluation of a cough. Onset of symptoms was several months ago. Symptoms have been rapidly improving since that time. The cough is productive and is aggravated by reclining position. Associated symptoms include: sputum production. Patient does not have a history of asthma  + copd.   Patient does not have a history of environmental allergens. Patient has not traveled recently. Patient does have a history of smoking. Patient has had a previous chest x-ray. Patient was tested for TB and test neg with HD.    Pt is also requesting STD testing-- he has more than one partner.   He is also requesting a refill on his pain meds.  No new complaints.    The following portions of the patient's history were reviewed and updated as appropriate: allergies, current medications, past family history, past medical history, past social history, past surgical history and problem list.  Review of Systems Pertinent items are noted in HPI.    Objective:    Oxygen saturation 99% on room air BP 106/70  Pulse 73  Temp(Src) 97.9 F (36.6 C) (Oral)  Wt 123 lb (55.792 kg)  SpO2 99% General appearance: alert, cooperative, appears stated age and no distress Throat: lips, mucosa, and tongue normal; teeth and gums normal Neck: no adenopathy, supple, symmetrical, trachea midline and thyroid not enlarged, symmetric, no tenderness/mass/nodules Lungs: clear to auscultation bilaterally Heart: S1, S2 normal    Assessment:    copd --- hx pneumonia and xray with cavitation    Plan:    Explained lack of efficacy of antibiotics in viral disease. Antitussives per medication orders. Avoid exposure to tobacco smoke and fumes. B-agonist inhaler. Call if shortness of breath worsens, blood in sputum, change in character of cough, development of fever or chills, inability to maintain nutrition and hydration. Avoid exposure to tobacco smoke and fumes. Chest x-ray. Smoking  cessation. f/u pulmonary   1. Cough   - DG Chest 2 View; Future - guaiFENesin-codeine (ROBITUSSIN AC) 100-10 MG/5ML syrup; 1-2 tsp po qhs prn cough  Dispense: 120 mL; Refill: 0  2. High risk sexual behavior   - Hepatitis C antibody - HIV antibody - RPR - HSV 2 antibody, IgG - GC/chlamydia probe amp, urine  3. Essential hypertension   - Basic metabolic panel - CBC with Differential - Hepatic function panel - Lipid panel  4. Chronic pain syndrome    - HYDROcodone-acetaminophen (NORCO) 7.5-325 MG per tablet; Take 1-2 tablets by mouth every 4 (four) hours as needed.  Dispense: 60 tablet; Refill: 0

## 2013-08-27 LAB — HIV ANTIBODY (ROUTINE TESTING W REFLEX): HIV 1&2 Ab, 4th Generation: NONREACTIVE

## 2013-08-27 LAB — GC/CHLAMYDIA PROBE AMP, URINE
Chlamydia, Swab/Urine, PCR: NEGATIVE
GC PROBE AMP, URINE: NEGATIVE

## 2013-08-27 LAB — HSV 2 ANTIBODY, IGG: HSV 2 Glycoprotein G Ab, IgG: 9.47 IV — ABNORMAL HIGH

## 2013-08-27 LAB — HEPATITIS C ANTIBODY: HCV Ab: NEGATIVE

## 2013-08-27 LAB — RPR

## 2013-09-01 ENCOUNTER — Telehealth: Payer: Self-pay

## 2013-09-01 ENCOUNTER — Ambulatory Visit (INDEPENDENT_AMBULATORY_CARE_PROVIDER_SITE_OTHER)
Admission: RE | Admit: 2013-09-01 | Discharge: 2013-09-01 | Disposition: A | Discharge status: Home / Self Care | Payer: Medicare Other | Source: Ambulatory Visit | Attending: Family Medicine | Admitting: Family Medicine

## 2013-09-01 ENCOUNTER — Telehealth: Payer: Self-pay | Admitting: Internal Medicine

## 2013-09-01 DIAGNOSIS — R7402 Elevation of levels of lactic acid dehydrogenase (LDH): Secondary | ICD-10-CM

## 2013-09-01 DIAGNOSIS — R740 Nonspecific elevation of levels of transaminase and lactic acid dehydrogenase [LDH]: Secondary | ICD-10-CM

## 2013-09-01 DIAGNOSIS — R7401 Elevation of levels of liver transaminase levels: Secondary | ICD-10-CM

## 2013-09-01 DIAGNOSIS — R059 Cough, unspecified: Secondary | ICD-10-CM

## 2013-09-01 DIAGNOSIS — R05 Cough: Secondary | ICD-10-CM

## 2013-09-01 DIAGNOSIS — N289 Disorder of kidney and ureter, unspecified: Secondary | ICD-10-CM

## 2013-09-01 DIAGNOSIS — B009 Herpesviral infection, unspecified: Secondary | ICD-10-CM

## 2013-09-01 MED ORDER — LEVOFLOXACIN 500 MG PO TABS: 500.0000 mg | ORAL_TABLET | Freq: Every day | ORAL | Status: DC

## 2013-09-01 MED ORDER — VALACYCLOVIR HCL 1 G PO TABS
1000.0000 mg | ORAL_TABLET | Freq: Every day | ORAL | Status: DC
Start: 2013-09-01 — End: 2014-01-04

## 2013-09-01 NOTE — Telephone Encounter (Signed)
Called Dr. Gustavus Bryant office and made them aware of pt's CXR results and Dr. Nonda Lou recommendation of following up with Pulmonary.  Pt has an upcoming appointment on Monday, 09/06/13 @ 0845 am.  Staff stated that they would also alert Dr. Melvyn Novas to view pt's CXR results and make a determination of what to do next.    Spoke with Dr. Etter Sjogren and made her aware.  She advised to call patient to determine whether or not he's coughing up anything.  If patient is coughing up anything, refill Levaquin 500 mg by mouth daily x 7 days.    Called patient, he stated that he is coughing up mucous, but that it's mostly clear, occasionally green.  Consistency- sometimes thick, mostly loose.  Denies blood tinged or additional color changes.    Dr. Etter Sjogren made aware and Levaquin refilled as ordered.  Pt made aware.   No further needs voiced.

## 2013-09-01 NOTE — Telephone Encounter (Signed)
noted 

## 2013-09-01 NOTE — Telephone Encounter (Signed)
[facility] Radiology called and reported:  CXR results---IMPRESSION:  1. Progressively worsening necrotizing pneumonia with definitive  areas of cavitation in the apex of the left upper lobe and probable  small parapneumonic loculated pleural effusion in the left apex.  2. Atherosclerosis.

## 2013-09-01 NOTE — Telephone Encounter (Signed)
Worsening of pneumonia--- refill levaquin and pt need f/u with pulmonary

## 2013-09-01 NOTE — Telephone Encounter (Signed)
Notes Recorded by Rosalita Chessman, DO on 08/26/2013 at 5:50 PM Kidney function elevated--- needs referral to nephrology Repeat--- hep panal 790.4  Spoke with patient and he voiced understanding, he said he was tired of seeing doctors, I made him aware that we can not force him to go but it is recommended by his PCP. He voiced understanding. Medications have been sent and he has agreed to recheck LFT's.      KP

## 2013-09-01 NOTE — Telephone Encounter (Signed)
Aware, pt set up for f/u in 5 days so nothing to add to rx from my perspective in meantime as this is a chronic issue dating back months

## 2013-09-01 NOTE — Telephone Encounter (Signed)
Dr. Etter Sjogren wanted MW to check on the cxr report that came in today.  MW please advise. thanks

## 2013-09-01 NOTE — Telephone Encounter (Signed)
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

## 2013-09-06 ENCOUNTER — Ambulatory Visit: Payer: Medicare Other | Admitting: Internal Medicine

## 2013-09-13 ENCOUNTER — Encounter: Payer: Self-pay | Admitting: Internal Medicine

## 2013-09-13 ENCOUNTER — Ambulatory Visit (INDEPENDENT_AMBULATORY_CARE_PROVIDER_SITE_OTHER): Payer: Medicare Other | Admitting: Internal Medicine

## 2013-09-13 VITALS — BP 118/82 | HR 77 | Temp 97.7°F | Ht 66.5 in | Wt 123.2 lb

## 2013-09-13 DIAGNOSIS — F17200 Nicotine dependence, unspecified, uncomplicated: Secondary | ICD-10-CM

## 2013-09-13 DIAGNOSIS — R918 Other nonspecific abnormal finding of lung field: Secondary | ICD-10-CM

## 2013-09-13 NOTE — Progress Notes (Signed)
Subjective:    Patient ID: [name] [name], male    DOB: [birth year]   MRN: [id number]    Brief patient profile:  67 yobm active smoker sp vats RULobectromy for [facility] 2008  and no adjuvant therapy with onset of doe about the same time of his surgery with increased need for albuterol starting after surgery referred to pulmonary  10/28/2012  by Dr Hilarie Fredrickson (whom  he saw for chronic pancreatitis)  for copd eval   History of Present Illness  10/28/2012 1st [facility] office visit/ Buell Parcel still smoking cc doe x 5 years slow progressive variably across the room and on a good day walk flat like at the mall but no hills/ hurry even on best days, assoc with some nasal congestion but no purulent secretions rec Only use your albuterol (proaire)as a rescue medication   Please schedule a follow up office visit in 6 weeks, call sooner if needed with pfts    12/09/2012 f/u ov/Hasani Diemer re: copd vs asthma  Chief Complaint  Patient presents with  . Followup with PFT    Pt states that his SOB is unchanged- still using proair qid whether he needs it or not.    sometimes sob "just standing up", sometimes not unless goes up hills Not typically having any  noct spells Comes into office s am saba (none x 12 h) with nl pfts but feeling his usual symptoms s it and says would have already used it if we had not asked him to hold off. rec Only use your albuterol (proaire)  As rescue The key is to stop smoking completely before smoking completely stops you!  Pulmonary follow up is as needed   07/29/2013 f/u ov/Datra Clary re: p rx for Biaxin 07/07/13 for ? LUL pna Chief Complaint  Patient presents with  . Follow-up    Pt last seen 12/09/12. He states that he was dxed with pneumonitis approx 1 month ago and is having increased SOB since then. Using rescue inhaler 2-3 times per day.  He states that he is SOB with or w/o any exertion. He has prod cough with moderate amounts of sputum-? color. Pt concerned about unintended wt loss  that started about 6 wks ago.   No saba since 12 h prior to OV   Did not bring saba with him rec  Refer to HD as prob TB > all sputum studies neg/ neg IPPD > referred back to pulmonary     09/13/2013 f/u ov/Erique Kaser re: cavitary dz LUL/ still smoking  Chief Complaint  Patient presents with  . Follow-up    Cough unchanged and is prod with mostly clear to white mucus, occasionally green to black.  Not limited by breathing from desired activities  "it's my legs"    No better p levaquin, continues to lose wt   No obvious pattern in day to day or daytime variabilty or assoc  cp or chest tightness, subjective wheeze overt sinus or hb symptoms. No unusual exp hx or h/o childhood pna/ asthma or knowledge of premature birth.  Sleeping ok without nocturnal  or early am exacerbation  of respiratory  c/o's or need for noct saba. Also denies any obvious fluctuation of symptoms with weather or environmental changes or other aggravating or alleviating factors except as outlined above   Current Medications, Allergies, Complete Past Medical History, Past Surgical History, Family History, and Social History were reviewed in [employer] record.  ROS  The following are not active complaints unless bolded  sore throat, dysphagia, dental problems, itching, sneezing,  nasal congestion or excess/ purulent secretions, ear ache,   fever, chills, sweats, unintended wt loss, pleuritic or exertional cp, hemoptysis,  orthopnea pnd or leg swelling, presyncope, palpitations, heartburn, abdominal pain, anorexia, nausea, vomiting, diarrhea  or change in bowel or urinary habits, change in stools or urine, dysuria,hematuria,  rash, arthralgias, visual complaints, headache, numbness weakness or ataxia or problems with walking or coordination,  change in mood/affect or memory.               Objective:   Physical Exam  07/29/2013         123 > 09/13/2013  123  Wt Readings from Last 3 Encounters:  12/09/12  126 lb (57.153 kg)  10/28/12 127 lb 6.4 oz (57.788 kg)  10/20/12 127 lb (57.607 kg)        amb bm nad  With unusual affect, very limited insight into medical care/ hopeless helpless   HEENT: nl dentition, turbinates, and orophanx. Nl external ear canals without cough reflex   NECK :  without JVD/Nodes/TM/ nl carotid upstrokes bilaterally   LUNGS: no acc muscle use, clear to A and P bilaterally without cough on insp or exp maneuvers   CV:  RRR  no s3 or murmur or increase in P2, no edema   ABD:  soft and nontender with nl excursion in the supine position. No bruits or organomegaly, bowel sounds nl  MS:  warm without deformities, calf tenderness, cyanosis or clubbing  SKIN: warm and dry without lesions          cxr 08/31/13  Progressively worsening necrotizing pneumonia with definitive  areas of cavitation in the apex of the left upper lobe and probable  small parapneumonic loculated pleural effusion in the left apex     Assessment & Plan:

## 2013-09-13 NOTE — Assessment & Plan Note (Signed)
-  first noted LUL changes  07/07/13 > rx biaxin > cavitary changes 07/29/2013 > referred to HD - 07/29/2013  [facility] RA  2 laps @ 185 ft each stopped due to  fatigue no desat  - ESR 112  07/29/2013 with quantiferon Gold >>> never drawn per lab/ notified 08/02/2013 can't be done on labs already drawn - 07/29/13 referred to HD > w/u for TB neg  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  Discussed in detail all the  indications, usual  risks and alternatives  relative to the benefits with patient who agrees to proceed with bronchoscopy with biopsy.

## 2013-09-13 NOTE — Patient Instructions (Addendum)
Come to outpatient registration at [facility] (behind the ER) at 34 am Thursday 09/16/13  with nothing to eat or drink after midnight Wednesday for Bronchoscopy   The key is to stop smoking completely before smoking completely stops you!

## 2013-09-13 NOTE — Assessment & Plan Note (Signed)

## 2013-09-16 ENCOUNTER — Ambulatory Visit (HOSPITAL_COMMUNITY): Payer: Medicare Other

## 2013-09-16 ENCOUNTER — Telehealth: Payer: Self-pay | Admitting: Internal Medicine

## 2013-09-16 ENCOUNTER — Encounter: Payer: Self-pay | Admitting: Vascular Surgery

## 2013-09-16 ENCOUNTER — Ambulatory Visit (HOSPITAL_COMMUNITY)
Admission: RE | Admit: 2013-09-16 | Discharge: 2013-09-16 | Disposition: A | Discharge status: Home / Self Care | Payer: Medicare Other | Source: Ambulatory Visit | Attending: Internal Medicine | Admitting: Internal Medicine

## 2013-09-16 ENCOUNTER — Encounter (HOSPITAL_COMMUNITY)
Admission: RE | Disposition: A | Discharge status: Home / Self Care | Payer: Medicaid Other | Source: Ambulatory Visit | Attending: Internal Medicine

## 2013-09-16 DIAGNOSIS — J852 Abscess of lung without pneumonia: Secondary | ICD-10-CM | POA: Diagnosis not present

## 2013-09-16 DIAGNOSIS — F17200 Nicotine dependence, unspecified, uncomplicated: Secondary | ICD-10-CM | POA: Insufficient documentation

## 2013-09-16 DIAGNOSIS — R918 Other nonspecific abnormal finding of lung field: Secondary | ICD-10-CM

## 2013-09-16 HISTORY — PX: VIDEO BRONCHOSCOPY: SHX5072

## 2013-09-16 SURGERY — BRONCHOSCOPY, WITH FLUOROSCOPY
Anesthesia: Moderate Sedation | Laterality: Bilateral

## 2013-09-16 MED ORDER — AMOXICILLIN-POT CLAVULANATE 875-125 MG PO TABS: 1.0000 | ORAL_TABLET | Freq: Two times a day (BID) | ORAL | Status: DC

## 2013-09-16 MED ORDER — MIDAZOLAM HCL 10 MG/2ML IJ SOLN
INTRAMUSCULAR | Status: AC
  Filled 2013-09-16: qty 4

## 2013-09-16 MED ORDER — MEPERIDINE HCL 25 MG/ML IJ SOLN
INTRAMUSCULAR | Status: DC | PRN
  Administered 2013-09-16: 50 mg via INTRAVENOUS

## 2013-09-16 MED ORDER — SODIUM CHLORIDE 0.9 % IV SOLN
INTRAVENOUS | Status: DC
Start: 2013-09-16 — End: 2013-09-16
  Administered 2013-09-16: 08:00:00 via INTRAVENOUS

## 2013-09-16 MED ORDER — MIDAZOLAM HCL 10 MG/2ML IJ SOLN
INTRAMUSCULAR | Status: DC | PRN
  Administered 2013-09-16 (×2): 5 mg via INTRAVENOUS

## 2013-09-16 MED ORDER — MEPERIDINE HCL 100 MG/ML IJ SOLN
INTRAMUSCULAR | Status: AC
  Filled 2013-09-16: qty 2

## 2013-09-16 MED ORDER — MIDAZOLAM HCL 5 MG/ML IJ SOLN: 1.0000 mg | Freq: Once | INTRAMUSCULAR | Status: DC

## 2013-09-16 MED ORDER — LIDOCAINE HCL 2 % EX GEL
CUTANEOUS | Status: DC | PRN
  Administered 2013-09-16: 1

## 2013-09-16 MED ORDER — LIDOCAINE HCL 1 % IJ SOLN
INTRAMUSCULAR | Status: DC | PRN
  Administered 2013-09-16: 6 mL via RESPIRATORY_TRACT

## 2013-09-16 MED ORDER — PHENYLEPHRINE HCL 0.25 % NA SOLN
NASAL | Status: DC | PRN
  Administered 2013-09-16: 2 via NASAL

## 2013-09-16 MED ORDER — PHENYLEPHRINE HCL 0.25 % NA SOLN: 1.0000 | Freq: Four times a day (QID) | NASAL | Status: DC | PRN

## 2013-09-16 MED ORDER — MEPERIDINE HCL 100 MG/ML IJ SOLN: 100.0000 mg | Freq: Once | INTRAMUSCULAR | Status: DC

## 2013-09-16 MED ORDER — LIDOCAINE HCL 2 % EX GEL: 1.0000 "application " | Freq: Once | CUTANEOUS | Status: DC

## 2013-09-16 NOTE — H&P (Signed)
Patient ID: [name] [name], male    DOB: [birth year]   MRN: [id number]     Brief patient profile:   67 yobm active smoker sp vats RULobectromy for [facility] 2008  and no adjuvant therapy with onset of doe about the same time of his surgery with increased need for albuterol starting after surgery referred to pulmonary  10/28/2012  by Dr Hilarie Fredrickson (whom  he saw for chronic pancreatitis)  for copd eval     History of Present Illness   10/28/2012 1st  Pulmonary office visit/ [name] [name] still smoking cc doe x 5 years slow progressive variably across the room and on a good day walk flat like at the mall but no hills/ hurry even on best days, assoc with some nasal congestion but no purulent secretions rec Only use your albuterol (proaire)as a rescue medication    Please schedule a follow up office visit in 6 weeks, call sooner if needed with pfts      12/09/2012 f/u ov/[name] [name] re: copd vs asthma   Chief Complaint   Patient presents with   .  Followup with PFT       Pt states that his SOB is unchanged- still using proair qid whether he needs it or not.      sometimes sob "just standing up", sometimes not unless goes up hills Not typically having any  noct spells Comes into office s am saba (none x 12 h) with nl pfts but feeling his usual symptoms s it and says would have already used it if we had not asked him to hold off. rec Only use your albuterol (proaire)  As rescue The key is to stop smoking completely before smoking completely stops you!   Pulmonary follow up is as needed     07/29/2013 f/u ov/[name] [name] re: p rx for Biaxin 07/07/13 for ? LUL pna Chief Complaint   Patient presents with   .  Follow-up       Pt last seen 12/09/12. He states that he was dxed with pneumonitis approx 1 month ago and is having increased SOB since then. Using rescue inhaler 2-3 times per day.  He states that he is SOB with or w/o any exertion. He has prod cough with moderate amounts of sputum-? color. Pt concerned about  unintended wt loss that started about 6 wks ago.     No saba since 12 h prior to OV    Did not bring saba with him rec   Refer to HD as prob TB > all sputum studies neg/ neg IPPD > referred back to pulmonary        09/13/2013 f/u ov/[name] [name] re: cavitary dz LUL/ still smoking   Chief Complaint   Patient presents with   .  Follow-up       Cough unchanged and is prod with mostly clear to white mucus, occasionally green to black.   Not limited by breathing from desired activities  "it's my legs"  No better p levaquin, continues to lose wt    No obvious pattern in day to day or daytime variabilty or assoc  cp or chest tightness, subjective wheeze overt sinus or hb symptoms. No unusual exp hx or h/o childhood pna/ asthma or knowledge of premature birth.   Sleeping ok without nocturnal  or early am exacerbation  of respiratory  c/o's or need for noct saba. Also denies any obvious fluctuation of symptoms with weather or environmental changes or other aggravating or alleviating factors except as outlined  above    Current Medications, Allergies, Complete Past Medical History, Past Surgical History, Family History, and Social History were reviewed in [employer] record.   ROS  The following are not active complaints unless bolded sore throat, dysphagia, dental problems, itching, sneezing,  nasal congestion or excess/ purulent secretions, ear ache,   fever, chills, sweats, unintended wt loss, pleuritic or exertional cp, hemoptysis,  orthopnea pnd or leg swelling, presyncope, palpitations, heartburn, abdominal pain, anorexia, nausea, vomiting, diarrhea  or change in bowel or urinary habits, change in stools or urine, dysuria,hematuria,  rash, arthralgias, visual complaints, headache, numbness weakness or ataxia or problems with walking or coordination,  change in mood/affect or memory.                  Objective:     Physical Exam   07/29/2013         123 > 09/13/2013   123   Wt Readings from Last 3 Encounters:   12/09/12  126 lb (57.153 kg)   10/28/12  127 lb 6.4 oz (57.788 kg)   10/20/12  127 lb (57.607 kg)            amb bm nad  With unusual affect, very limited insight into medical care/ hopeless helpless    HEENT: nl dentition, turbinates, and orophanx. Nl external ear canals without cough reflex     NECK :  without JVD/Nodes/TM/ nl carotid upstrokes bilaterally     LUNGS: no acc muscle use, clear to A and P bilaterally without cough on insp or exp maneuvers     CV:  RRR  no s3 or murmur or increase in P2, no edema    ABD:  soft and nontender with nl excursion in the supine position. No bruits or organomegaly, bowel sounds nl   MS:  warm without deformities, calf tenderness, cyanosis or clubbing   SKIN: warm and dry without lesions             cxr 08/31/13   Progressively worsening necrotizing pneumonia with definitive   areas of cavitation in the apex of the left upper lobe and probable   small parapneumonic loculated pleural effusion in the left apex       Assessment & Plan:               Pulmonary infiltrates -    - first noted LUL changes  07/07/13 > rx biaxin > cavitary changes 07/29/2013  - 07/29/2013  Walked RA  2 laps @ 185 ft each stopped due to  fatigue no desat   - ESR 112  07/29/2013 with quantiferon Gold >>> never drawn per lab/ notified 08/02/2013 can't be done on labs already drawn - 07/29/13 referred to HD > w/u for TB neg   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   Discussed in detail all the  indications, usual  risks and alternatives  relative to the benefits with patient who agrees to proceed with bronchoscopy with biopsy.               Smoker     > 3 min discussion I reviewed the Flethcher curve with patient that basically indicates  if you quit smoking when your best day FEV1 is still well preserved (as is  the case here)  it is highly unlikely you will progress to severe disease and informed the patient there was  no medication on the market that has proven to change the curve or the likelihood of progression.  Therefore stopping smoking and maintaining abstinence is the most important aspect of care, not choice of inhalers or for that matter, doctors.        09/16/2013 day of FOB no change in hx or exam   Christinia Gully, MD Pulmonary and [place] [phone] After 5:30 PM or weekends, call [phone]

## 2013-09-16 NOTE — Discharge Instructions (Signed)
Flexible Bronchoscopy, Care After These instructions give you information on caring for yourself after your procedure. Your doctor may also give you more specific instructions. Call your doctor if you have any problems or questions after your procedure. HOME CARE  Do not eat or drink anything for 2 hours after your procedure. If you try to eat or drink before the medicine wears off, food or drink could go into your lungs. You could also burn yourself.  After 2 hours have passed and when you can cough and gag normally, you may eat soft food and drink liquids slowly.  The day after the test, you may eat your normal diet.  You may do your normal activities.  Keep all doctor visits. GET HELP RIGHT AWAY IF:  You get more and more short of breath.  You get light-headed.  You feel like you are going to pass out (faint).  You have chest pain.  You have new problems that worry you.  You cough up more than a little blood.  You cough up more blood than before. MAKE SURE YOU:  Understand these instructions.  Will watch your condition.  Will get help right away if you are not doing well or get worse. Document Released: 11/11/2008 Document Revised: 01/19/2013 Document Reviewed: 09/18/2012 [facility] Patient Information 2015 [place], [place]. This information is not intended to replace advice given to you by your health care provider. Make sure you discuss any questions you have with your health care provider.  Do not eat or drink until after 1020 today 09/16/2013

## 2013-09-16 NOTE — Op Note (Addendum)
Bronchoscopy Procedure Note  Date of Operation: 09/16/2013   Pre-op Diagnosis: necrotizing process LUL  Post-op Diagnosis: same  Surgeon: Christinia Gully  Anesthesia: Monitored Local Anesthesia with Sedation  Operation: Video Flexible fiberoptic bronchoscopy, diagnostic   Findings: copious mucopurulent secretions  Specimen: BAL LUL/ tbbx LUL  Estimated Blood Loss: none  Complications: none  Indications and History: See updated H and P same date. The risks, benefits, complications, treatment options and expected outcomes were discussed with the patient.  The possibilities of reaction to medication, pulmonary aspiration, perforation of a viscus, bleeding, failure to diagnose a condition and creating a complication requiring transfusion or operation were discussed with the patient who freely agreed to the procedure verbally but due to IV in R arm did not sign the consent form - a time out was performed during which pt stated you're going to biopsy my Left lower lobe to which i corrrected him that is was his Left upper lobe and he nodded agreement.   Description of Procedure: The patient was re-examined in the bronchoscopy suite and the site of surgery properly noted/marked.  The patient was identified  and the procedure verified as Flexible Fiberoptic Bronchoscopy.  A Time Out was held and the above information confirmed.   After the induction of topical nasopharyngeal anesthesia, the patient was positioned  and the bronchoscope was passed through the R naris. The vocal cords were visualized and  1% buffered lidocaine 5 ml was topically placed onto the cords. The cords were nl. The scope was then passed into the trachea.  1% buffered lidocaine given topically. Airways inspected bilaterally to the subsegmental level with the following findings: 1) Trachea/ carina and all airways nl to subsegmental level except for A) copious mucopurulent secretions LUL apical segment B) surgically absent RUL      Interventions: LUL BAL> brown fluid LUL Tbbx x 4 with excellent tissue      The Patient was taken to the Endoscopy Recovery area in satisfactory condition.  Attestation: I performed the procedure.  Christinia Gully, MD Pulmonary and [place] [phone] After 5:30 PM or weekends, call [phone]

## 2013-09-16 NOTE — Telephone Encounter (Signed)
Pt has been febrile, has felt run-down and weak since FOB this am. Denies CP or dyspnea. He underwent BAL and TBBxs by Dr Melvyn Novas for LUL rounded lesion of unclear etiology. Will order abx to cover for possible bronchitis vs PNA post-procedure. Have asked him to call our office 8/21 to report on his sx, to go to the ED if he worsens in any way.   augmentin 875mg  bid x 7 days

## 2013-09-16 NOTE — Progress Notes (Signed)
Video bronchoscopy performed Intervention bronchial washings Intervention bronchial biopsy Pt tolerated well  Kathie Dike

## 2013-09-16 NOTE — Telephone Encounter (Signed)
lmomtcb x1 

## 2013-09-17 ENCOUNTER — Ambulatory Visit (HOSPITAL_COMMUNITY)
Admission: RE | Admit: 2013-09-17 | Discharge: 2013-09-17 | Disposition: A | Discharge status: Home / Self Care | Payer: Medicare Other | Source: Ambulatory Visit | Attending: Vascular Surgery | Admitting: Vascular Surgery

## 2013-09-17 ENCOUNTER — Ambulatory Visit (INDEPENDENT_AMBULATORY_CARE_PROVIDER_SITE_OTHER)
Admission: RE | Admit: 2013-09-17 | Discharge: 2013-09-17 | Disposition: A | Discharge status: Home / Self Care | Payer: Medicare Other | Source: Ambulatory Visit | Attending: Vascular Surgery | Admitting: Vascular Surgery

## 2013-09-17 ENCOUNTER — Other Ambulatory Visit: Payer: Self-pay | Admitting: Internal Medicine

## 2013-09-17 ENCOUNTER — Encounter (HOSPITAL_COMMUNITY): Payer: Self-pay | Admitting: Internal Medicine

## 2013-09-17 ENCOUNTER — Ambulatory Visit (INDEPENDENT_AMBULATORY_CARE_PROVIDER_SITE_OTHER): Payer: Medicare Other | Admitting: Vascular Surgery

## 2013-09-17 VITALS — BP 135/85 | HR 87 | Resp 16 | Ht 66.5 in | Wt 120.0 lb

## 2013-09-17 DIAGNOSIS — I739 Peripheral vascular disease, unspecified: Secondary | ICD-10-CM

## 2013-09-17 DIAGNOSIS — I7409 Other arterial embolism and thrombosis of abdominal aorta: Secondary | ICD-10-CM

## 2013-09-17 DIAGNOSIS — I779 Disorder of arteries and arterioles, unspecified: Secondary | ICD-10-CM

## 2013-09-17 DIAGNOSIS — I70219 Atherosclerosis of native arteries of extremities with intermittent claudication, unspecified extremity: Secondary | ICD-10-CM

## 2013-09-17 DIAGNOSIS — R918 Other nonspecific abnormal finding of lung field: Secondary | ICD-10-CM

## 2013-09-17 NOTE — Progress Notes (Signed)
    Established Previous Bypass  History of Present Illness  [name] [name] is a 68 y.o. (08/22/1945) male who presents with chief complaint: left leg pain.  Previous operation(s) include:  1. L iliofem EA w/ BPA, L CFA to AT/BK pop bypass with propaten, L BK and AT EA w/ BPA (09/09/12)  2. PTA L EIA PTA, R CIA PTA x 2 (08/03/12)  3. R iliofem EA w/ BPA, extended profundaplasty, PTA R EIA & CIA (05/16/10)   The patient's symptoms have not progressed. The patient's symptoms are: L leg intermittent claudication. His R leg has been asx recently.  The patient continues to smoke. The patient freely admits poor medical compliance.  The patient's treatment regimen currently included: maximal medical management.  The patient's PMH, PSH, SH, FamHx, Med, and Allergies are unchanged from 06/11/13.  On ROS today: L leg intermittent claudication , no rest pain  Physical Examination  Filed Vitals:   09/17/13 1111  BP: 135/85  Pulse: 87  Resp: 16  Height: 5' 6.5" (1.689 m)  Weight: 120 lb (54.432 kg)  SpO2: 100%   Body mass index is 19.08 kg/(m^2).  Physical Examination  Filed Vitals:   09/17/13 1111  BP: 135/85  Pulse: 87  Resp: 16  Height: 5' 6.5" (1.689 m)  Weight: 120 lb (54.432 kg)  SpO2: 100%   Body mass index is 19.08 kg/(m^2).  Pt refused exam as he needed to be somewhere else  Non-Invasive Vascular Imaging ABI (Date: 09/17/2013) R: 0.48 (0.68), DP: mono, PT: mono, TBI: 0.18 L: 0.84 (0.86), DP: mono, PT: mono, TBI: 0.48  Aortoiliac Duplex (Date: 09/17/2013)  Ao: 110 c/s  R iliac: 411 c/s  L iliac: 347 c/s  Patent iliac stents  Occluded R CFA  LLE bypass duplex (09/17/2013)  Triphasic inflow  Brisk mono to biphasic throughout graft  Medical Decision Making  [name] [name] is a 68 y.o. male who presents with: s/p multiple revascularization listed above, Asx Severe to critical PAD in RLE, patent L fem-BK pop/AT bypass with propaten   Pt's poor compliance has  resulted in disease progression resulting in occlusion in his R CFA.  The patient denies any sx so is not interested in any interventions on that side.  He demonstrates continued graft patency with sx, suggesting his sx are not due to his arterial disease.  I emphasized again the importance of medication compliance and smoking cessation.    Based on the patient's vascular studies and examination, I have offered the patient: BLE ABI, bypass duplex, aortoiliac duplex q6 month  I discussed in depth with the patient the nature of atherosclerosis, and emphasized the importance of maximal medical management including strict control of blood pressure, blood glucose, and lipid levels, obtaining regular exercise, and cessation of smoking.  The patient is aware that without maximal medical management the underlying atherosclerotic disease process will progress, limiting the benefit of any interventions.  The patient is scheduled for the previous listed surveillance studies in 6 months.  Pt is not allowing any interventions so more frequent surveillance is pointless anyway.  Thank you for allowing [place] to participate in this patient's care.  Adele Barthel, MD [facility] Office: [phone] Pager: [phone]  09/17/2013, 11:41 AM

## 2013-09-18 LAB — CULTURE, RESPIRATORY W GRAM STAIN

## 2013-09-18 LAB — CULTURE, RESPIRATORY: SPECIAL REQUESTS: NORMAL

## 2013-09-27 ENCOUNTER — Telehealth: Payer: Self-pay | Admitting: Internal Medicine

## 2013-09-27 MED ORDER — AMOXICILLIN-POT CLAVULANATE 875-125 MG PO TABS
1.0000 | ORAL_TABLET | Freq: Two times a day (BID) | ORAL | Status: DC
Start: 2013-09-27 — End: 2013-10-03

## 2013-09-27 NOTE — Telephone Encounter (Signed)
Pt is scheduled to see Dr Baxter Flattery with ID 09/30/13 at 9am  Pt aware that Rx sent to [facility] rec's per Dr Melvyn Novas Pt to call if any further questions.  Nothing further needed.

## 2013-09-27 NOTE — Telephone Encounter (Signed)
Pt c/o increased cough with clear mucus, wheezing and SOB x 1 week -- worsening Pt states that his fever has been hovering around 100-101 Pt states that he has been using all inhalers as directed -- pt feels that inhalers make it worse. Not using OTC for symptoms.   Allergies  Allergen Reactions  . Morphine And Related Itching    Head to toe  . Chantix [Varenicline] Other (See Comments)    Dreams, itching, suicidal ideation  . Hydromorphone Hcl Itching    Tolerates Hydrocodone-Acetaminophen  . Ibuprofen Nausea And Vomiting  . Omnipaque [Iohexol]      Code: HIVES, Desc: FACIAL HIVES S/P IV CONTRAST.Marland KitchenMarland KitchenOK W/ 25MG  BENADRYL//A.C., Onset Date: [id number]   . Shellfish Allergy Swelling   Please advise Dr Melvyn Novas. Thanks.

## 2013-09-27 NOTE — Telephone Encounter (Signed)
See what happened to ID referral and see if we can expedite it for this week - in meantime ok to call in Augmentin 875 mg take one pill twice daily  X 10 days - take at breakfast and supper with large glass of water.  It would help reduce the usual side effects (diarrhea and yeast infections) if you ate cultured yogurt at lunch.

## 2013-09-29 ENCOUNTER — Ambulatory Visit: Payer: Medicare Other | Admitting: Infectious Disease

## 2013-09-30 ENCOUNTER — Ambulatory Visit: Payer: Medicare Other | Admitting: Internal Medicine

## 2013-10-03 ENCOUNTER — Inpatient Hospital Stay (HOSPITAL_COMMUNITY)
Admission: EM | Admit: 2013-10-03 | Discharge: 2013-10-12 | DRG: 871 | Disposition: A | Discharge status: Skilled Nursing Facility | Payer: Medicare Other | Attending: Internal Medicine | Admitting: Internal Medicine

## 2013-10-03 ENCOUNTER — Emergency Department (HOSPITAL_COMMUNITY): Payer: Medicare Other

## 2013-10-03 ENCOUNTER — Inpatient Hospital Stay (HOSPITAL_COMMUNITY): Payer: Medicare Other

## 2013-10-03 ENCOUNTER — Encounter (HOSPITAL_COMMUNITY): Payer: Self-pay | Admitting: Emergency Medicine

## 2013-10-03 DIAGNOSIS — J189 Pneumonia, unspecified organism: Secondary | ICD-10-CM | POA: Diagnosis present

## 2013-10-03 DIAGNOSIS — F17200 Nicotine dependence, unspecified, uncomplicated: Secondary | ICD-10-CM | POA: Diagnosis present

## 2013-10-03 DIAGNOSIS — D631 Anemia in chronic kidney disease: Secondary | ICD-10-CM | POA: Diagnosis present

## 2013-10-03 DIAGNOSIS — B49 Unspecified mycosis: Secondary | ICD-10-CM

## 2013-10-03 DIAGNOSIS — D72829 Elevated white blood cell count, unspecified: Secondary | ICD-10-CM | POA: Diagnosis not present

## 2013-10-03 DIAGNOSIS — IMO0002 Reserved for concepts with insufficient information to code with codable children: Secondary | ICD-10-CM | POA: Diagnosis not present

## 2013-10-03 DIAGNOSIS — R509 Fever, unspecified: Secondary | ICD-10-CM

## 2013-10-03 DIAGNOSIS — J852 Abscess of lung without pneumonia: Secondary | ICD-10-CM | POA: Diagnosis present

## 2013-10-03 DIAGNOSIS — I129 Hypertensive chronic kidney disease with stage 1 through stage 4 chronic kidney disease, or unspecified chronic kidney disease: Secondary | ICD-10-CM | POA: Diagnosis present

## 2013-10-03 DIAGNOSIS — T40605A Adverse effect of unspecified narcotics, initial encounter: Secondary | ICD-10-CM | POA: Diagnosis present

## 2013-10-03 DIAGNOSIS — B449 Aspergillosis, unspecified: Secondary | ICD-10-CM | POA: Diagnosis present

## 2013-10-03 DIAGNOSIS — D599 Acquired hemolytic anemia, unspecified: Secondary | ICD-10-CM

## 2013-10-03 DIAGNOSIS — J850 Gangrene and necrosis of lung: Secondary | ICD-10-CM

## 2013-10-03 DIAGNOSIS — D509 Iron deficiency anemia, unspecified: Secondary | ICD-10-CM | POA: Diagnosis present

## 2013-10-03 DIAGNOSIS — Z8711 Personal history of peptic ulcer disease: Secondary | ICD-10-CM

## 2013-10-03 DIAGNOSIS — E43 Unspecified severe protein-calorie malnutrition: Secondary | ICD-10-CM | POA: Diagnosis present

## 2013-10-03 DIAGNOSIS — Z79899 Other long term (current) drug therapy: Secondary | ICD-10-CM | POA: Diagnosis not present

## 2013-10-03 DIAGNOSIS — H409 Unspecified glaucoma: Secondary | ICD-10-CM | POA: Diagnosis present

## 2013-10-03 DIAGNOSIS — N183 Chronic kidney disease, stage 3 unspecified: Secondary | ICD-10-CM | POA: Diagnosis present

## 2013-10-03 DIAGNOSIS — I10 Essential (primary) hypertension: Secondary | ICD-10-CM | POA: Diagnosis present

## 2013-10-03 DIAGNOSIS — A419 Sepsis, unspecified organism: Principal | ICD-10-CM | POA: Diagnosis present

## 2013-10-03 DIAGNOSIS — R634 Abnormal weight loss: Secondary | ICD-10-CM | POA: Diagnosis present

## 2013-10-03 DIAGNOSIS — N039 Chronic nephritic syndrome with unspecified morphologic changes: Secondary | ICD-10-CM | POA: Diagnosis present

## 2013-10-03 DIAGNOSIS — D649 Anemia, unspecified: Secondary | ICD-10-CM | POA: Diagnosis not present

## 2013-10-03 DIAGNOSIS — Z8546 Personal history of malignant neoplasm of prostate: Secondary | ICD-10-CM | POA: Diagnosis not present

## 2013-10-03 DIAGNOSIS — L299 Pruritus, unspecified: Secondary | ICD-10-CM | POA: Diagnosis present

## 2013-10-03 DIAGNOSIS — Y95 Nosocomial condition: Secondary | ICD-10-CM

## 2013-10-03 DIAGNOSIS — I739 Peripheral vascular disease, unspecified: Secondary | ICD-10-CM | POA: Diagnosis present

## 2013-10-03 DIAGNOSIS — F1210 Cannabis abuse, uncomplicated: Secondary | ICD-10-CM | POA: Diagnosis present

## 2013-10-03 DIAGNOSIS — J438 Other emphysema: Secondary | ICD-10-CM | POA: Diagnosis present

## 2013-10-03 DIAGNOSIS — J17 Pneumonia in diseases classified elsewhere: Secondary | ICD-10-CM

## 2013-10-03 DIAGNOSIS — D638 Anemia in other chronic diseases classified elsewhere: Secondary | ICD-10-CM

## 2013-10-03 DIAGNOSIS — Z8709 Personal history of other diseases of the respiratory system: Secondary | ICD-10-CM

## 2013-10-03 DIAGNOSIS — Z85118 Personal history of other malignant neoplasm of bronchus and lung: Secondary | ICD-10-CM

## 2013-10-03 DIAGNOSIS — J168 Pneumonia due to other specified infectious organisms: Secondary | ICD-10-CM

## 2013-10-03 HISTORY — DX: Malignant neoplasm of prostate: C61

## 2013-10-03 HISTORY — DX: Malignant neoplasm of upper lobe, unspecified bronchus or lung: C34.10

## 2013-10-03 LAB — COMPREHENSIVE METABOLIC PANEL
ALBUMIN: 2.8 g/dL — AB (ref 3.5–5.2)
ALT: 7 U/L (ref 0–53)
AST: 11 U/L (ref 0–37)
Alkaline Phosphatase: 96 U/L (ref 39–117)
Anion gap: 17 — ABNORMAL HIGH (ref 5–15)
BILIRUBIN TOTAL: 0.5 mg/dL (ref 0.3–1.2)
BUN: 21 mg/dL (ref 6–23)
CHLORIDE: 92 meq/L — AB (ref 96–112)
CO2: 22 mEq/L (ref 19–32)
Calcium: 9.1 mg/dL (ref 8.4–10.5)
Creatinine, Ser: 1.52 mg/dL — ABNORMAL HIGH (ref 0.50–1.35)
GFR calc Af Amer: 54 mL/min — ABNORMAL LOW (ref >90)
GFR calc non Af Amer: 46 mL/min — ABNORMAL LOW (ref >90)
Glucose, Bld: 126 mg/dL — ABNORMAL HIGH (ref 70–99)
Potassium: 4.1 mEq/L (ref 3.7–5.3)
Sodium: 131 mEq/L — ABNORMAL LOW (ref 137–147)
Total Protein: 8.7 g/dL — ABNORMAL HIGH (ref 6.0–8.3)

## 2013-10-03 LAB — CBC WITH DIFFERENTIAL/PLATELET
Basophils Absolute: 0 10*3/uL (ref 0.0–0.1)
Basophils Relative: 0 % (ref 0–1)
Eosinophils Absolute: 0.9 10*3/uL — ABNORMAL HIGH (ref 0.0–0.7)
Eosinophils Relative: 6 % — ABNORMAL HIGH (ref 0–5)
HCT: 26.6 % — ABNORMAL LOW (ref 39.0–52.0)
Hemoglobin: 9.3 g/dL — ABNORMAL LOW (ref 13.0–17.0)
LYMPHS PCT: 11 % — AB (ref 12–46)
Lymphs Abs: 1.6 10*3/uL (ref 0.7–4.0)
MCH: 25.9 pg — ABNORMAL LOW (ref 26.0–34.0)
MCHC: 35 g/dL (ref 30.0–36.0)
MCV: 74.1 fL — ABNORMAL LOW (ref 78.0–100.0)
MONOS PCT: 10 % (ref 3–12)
Monocytes Absolute: 1.4 10*3/uL — ABNORMAL HIGH (ref 0.1–1.0)
NEUTROS PCT: 73 % (ref 43–77)
Neutro Abs: 10.3 10*3/uL — ABNORMAL HIGH (ref 1.7–7.7)
PLATELETS: 465 10*3/uL — AB (ref 150–400)
RBC: 3.59 MIL/uL — ABNORMAL LOW (ref 4.22–5.81)
RDW: 15 % (ref 11.5–15.5)
WBC: 14.2 10*3/uL — AB (ref 4.0–10.5)

## 2013-10-03 LAB — I-STAT CG4 LACTIC ACID, ED: Lactic Acid, Venous: 2.48 mmol/L — ABNORMAL HIGH (ref 0.5–2.2)

## 2013-10-03 MED ORDER — DEXTROSE 5 % IV SOLN
1.0000 g | Freq: Once | INTRAVENOUS | Status: AC
  Administered 2013-10-03: 1 g via INTRAVENOUS
  Filled 2013-10-03: qty 1

## 2013-10-03 MED ORDER — SODIUM CHLORIDE 0.9 % IV BOLUS (SEPSIS)
1000.0000 mL | Freq: Once | INTRAVENOUS | Status: AC
  Administered 2013-10-03: 1000 mL via INTRAVENOUS

## 2013-10-03 MED ORDER — VORICONAZOLE 200 MG IV SOLR
6.0000 mg/kg | Freq: Two times a day (BID) | INTRAVENOUS | Status: AC
  Administered 2013-10-04 (×2): 330 mg via INTRAVENOUS
  Filled 2013-10-03 (×2): qty 330

## 2013-10-03 MED ORDER — VANCOMYCIN HCL IN DEXTROSE 750-5 MG/150ML-% IV SOLN
750.0000 mg | INTRAVENOUS | Status: DC
  Administered 2013-10-03 – 2013-10-05 (×3): 750 mg via INTRAVENOUS
  Filled 2013-10-03 (×4): qty 150

## 2013-10-03 MED ORDER — ACETAMINOPHEN 325 MG PO TABS
650.0000 mg | ORAL_TABLET | Freq: Four times a day (QID) | ORAL | Status: DC | PRN
  Administered 2013-10-03: 650 mg via ORAL
  Filled 2013-10-03: qty 2

## 2013-10-03 NOTE — ED Notes (Signed)
Pt arrives by EMS with complaints fever for the past 2 weeks-EMS states he was diagnosed with a fungal infection in his lungs 2 weeks ago
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
## 2013-10-03 NOTE — ED Notes (Signed)
Bed: LG49 Expected date:  Expected time:  Means of arrival:  Comments: EMS/67 yo fever 102

## 2013-10-03 NOTE — ED Notes (Signed)
Pt is unable to urinate at this time. 

## 2013-10-03 NOTE — Progress Notes (Signed)
ANTIBIOTIC CONSULT NOTE - INITIAL  Pharmacy Consult for Vancomycin Indication: pneumonia  Allergies  Allergen Reactions  . Morphine And Related Itching    Head to toe  . Chantix [Varenicline] Other (See Comments)    Dreams, itching, suicidal ideation  . Hydromorphone Hcl Itching    Tolerates Hydrocodone-Acetaminophen  . Ibuprofen Nausea And Vomiting  . Omnipaque [Iohexol]      Code: HIVES, Desc: FACIAL HIVES S/P IV CONTRAST.Marland KitchenMarland KitchenOK W/ 25MG  BENADRYL//A.C., Onset Date: [id number]   . Shellfish Allergy Swelling    Patient Measurements: Height: 5' 6.5" (168.9 cm) (Documented in Aug 2015) Weight: 120 lb (54.432 kg) (Documented August 2015) IBW/kg (Calculated) : 64.95 Adjusted Body Weight:   Vital Signs: Temp: 102.5 F (39.2 C) (09/06 2003) Temp src: Oral (09/06 2003) BP: 114/71 mmHg (09/06 2030) Pulse Rate: 111 (09/06 2030) Intake/Output from previous day:   Intake/Output from this shift:    Labs:  Recent Labs  10/03/13 2026  WBC 14.2*  HGB 9.3*  PLT 465*  CREATININE 1.52*   Estimated Creatinine Clearance: 37.3 ml/min (by C-G formula based on Cr of 1.52). No results found for this basename: VANCOTROUGH, Corlis Leak, VANCORANDOM, GENTTROUGH, GENTPEAK, GENTRANDOM, TOBRATROUGH, TOBRAPEAK, TOBRARND, AMIKACINPEAK, AMIKACINTROU, AMIKACIN,  in the last 72 hours   Microbiology: Recent Results (from the past 720 hour(s))  AFB CULTURE WITH SMEAR     Status: None   Collection Time    09/16/13  8:23 AM      Result Value Ref Range Status   Specimen Description BRONCHIAL ALVEOLAR LAVAGE   Final   Special Requests Normal   Final   Acid Fast Smear     Final   Value: NO ACID FAST BACILLI SEEN     Performed at [facility]   Culture     Final   Value: CULTURE WILL BE EXAMINED FOR 6 WEEKS BEFORE ISSUING A FINAL REPORT     Performed at [facility]   Report Status PENDING   Incomplete  FUNGUS CULTURE W SMEAR     Status: None   Collection Time    09/16/13  8:23 AM       Result Value Ref Range Status   Specimen Description BRONCHIAL ALVEOLAR LAVAGE   Final   Special Requests Normal   Final   Fungal Smear     Final   Value: NO YEAST OR FUNGAL ELEMENTS SEEN     Performed at [facility]   Culture     Final   Value: CULTURE IN PROGRESS FOR FOUR WEEKS     Performed at [facility]   Report Status PENDING   Incomplete  CULTURE, RESPIRATORY (NON-EXPECTORATED)     Status: None   Collection Time    09/16/13  8:35 AM      Result Value Ref Range Status   Specimen Description BRONCHIAL ALVEOLAR LAVAGE   Final   Special Requests Normal   Final   Gram Stain     Final   Value: ABUNDANT WBC PRESENT, PREDOMINANTLY PMN     NO SQUAMOUS EPITHELIAL CELLS SEEN     RARE GRAM POSITIVE COCCI     IN PAIRS     Performed at [facility]   Culture     Final   Value: Non-Pathogenic Oropharyngeal-type Flora Isolated.     Performed at [facility]   Report Status 09/18/2013 FINAL   Final    Medical History: Past Medical History  Diagnosis Date  . Peripheral vascular disease  with claudication  . Pancreatitis     chronic  . Hypertension   . Glaucoma   . COPD (chronic obstructive pulmonary disease)   . Ulcer     peptic ulcer disease  . H/O alcohol abuse   . History of tobacco abuse   . PONV (postoperative nausea and vomiting)   . Anemia   . GERD (gastroesophageal reflux disease)     "NOT TO BAD NOW"  . Carpal tunnel syndrome, bilateral   . Colon polyps 12/2011    Medications:  Scheduled:   Infusions:  . ceFEPime (MAXIPIME) IV    . sodium chloride     PRN: acetaminophen Assessment: 68 yo M with history of Right upper lobectomy--[facility] '08 Patient is a smoker. Request for pharmacy to dose Vancomycin for PNA  Goal of Therapy:  Vancomycin trough level 15-20 mcg/ml  Plan:    Begin Vancomycin 750mg  IV q 24 hours.  Measure antibiotic drug levels at steady state  Follow up culture results  Gypsy Decant 10/03/2013,9:29 PM

## 2013-10-03 NOTE — ED Notes (Signed)
Pt is unable to urinate  At this time.
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
## 2013-10-04 ENCOUNTER — Encounter (HOSPITAL_COMMUNITY): Payer: Self-pay | Admitting: Radiology

## 2013-10-04 ENCOUNTER — Inpatient Hospital Stay (HOSPITAL_COMMUNITY): Payer: Medicare Other

## 2013-10-04 DIAGNOSIS — N183 Chronic kidney disease, stage 3 unspecified: Secondary | ICD-10-CM | POA: Diagnosis present

## 2013-10-04 DIAGNOSIS — B449 Aspergillosis, unspecified: Secondary | ICD-10-CM

## 2013-10-04 DIAGNOSIS — E43 Unspecified severe protein-calorie malnutrition: Secondary | ICD-10-CM | POA: Diagnosis present

## 2013-10-04 DIAGNOSIS — D509 Iron deficiency anemia, unspecified: Secondary | ICD-10-CM

## 2013-10-04 DIAGNOSIS — J852 Abscess of lung without pneumonia: Secondary | ICD-10-CM

## 2013-10-04 DIAGNOSIS — F17200 Nicotine dependence, unspecified, uncomplicated: Secondary | ICD-10-CM

## 2013-10-04 LAB — EXPECTORATED SPUTUM ASSESSMENT W GRAM STAIN, RFLX TO RESP C

## 2013-10-04 LAB — CBC WITH DIFFERENTIAL/PLATELET
Basophils Absolute: 0 10*3/uL (ref 0.0–0.1)
Basophils Absolute: 0 10*3/uL (ref 0.0–0.1)
Basophils Relative: 0 % (ref 0–1)
Basophils Relative: 0 % (ref 0–1)
EOS ABS: 2 10*3/uL — AB (ref 0.0–0.7)
EOS PCT: 4 % (ref 0–5)
Eosinophils Absolute: 0.6 10*3/uL (ref 0.0–0.7)
Eosinophils Relative: 15 % — ABNORMAL HIGH (ref 0–5)
HCT: 22.9 % — ABNORMAL LOW (ref 39.0–52.0)
HEMATOCRIT: 21.1 % — AB (ref 39.0–52.0)
HEMOGLOBIN: 7.7 g/dL — AB (ref 13.0–17.0)
Hemoglobin: 7.3 g/dL — ABNORMAL LOW (ref 13.0–17.0)
LYMPHS ABS: 1.7 10*3/uL (ref 0.7–4.0)
Lymphocytes Relative: 11 % — ABNORMAL LOW (ref 12–46)
Lymphocytes Relative: 12 % (ref 12–46)
Lymphs Abs: 1.6 10*3/uL (ref 0.7–4.0)
MCH: 25.1 pg — AB (ref 26.0–34.0)
MCH: 25.8 pg — AB (ref 26.0–34.0)
MCHC: 33.6 g/dL (ref 30.0–36.0)
MCHC: 34.6 g/dL (ref 30.0–36.0)
MCV: 74.6 fL — AB (ref 78.0–100.0)
MCV: 74.6 fL — ABNORMAL LOW (ref 78.0–100.0)
MONO ABS: 1.1 10*3/uL — AB (ref 0.1–1.0)
MONO ABS: 0.8 10*3/uL (ref 0.1–1.0)
Monocytes Relative: 7 % (ref 3–12)
Monocytes Relative: 6 % (ref 3–12)
NEUTROS ABS: 8.7 10*3/uL — AB (ref 1.7–7.7)
Neutro Abs: 12.5 10*3/uL — ABNORMAL HIGH (ref 1.7–7.7)
Neutrophils Relative %: 78 % — ABNORMAL HIGH (ref 43–77)
Neutrophils Relative %: 67 % (ref 43–77)
Platelets: 404 10*3/uL — ABNORMAL HIGH (ref 150–400)
Platelets: 382 10*3/uL (ref 150–400)
RBC: 3.07 MIL/uL — ABNORMAL LOW (ref 4.22–5.81)
RBC: 2.83 MIL/uL — ABNORMAL LOW (ref 4.22–5.81)
RDW: 15.2 % (ref 11.5–15.5)
RDW: 15.1 % (ref 11.5–15.5)
WBC: 15.9 10*3/uL — ABNORMAL HIGH (ref 4.0–10.5)
WBC: 13.1 10*3/uL — ABNORMAL HIGH (ref 4.0–10.5)

## 2013-10-04 LAB — URINALYSIS W MICROSCOPIC (NOT AT ARMC)
Bilirubin Urine: NEGATIVE
GLUCOSE, UA: NEGATIVE mg/dL
Hgb urine dipstick: NEGATIVE
Ketones, ur: NEGATIVE mg/dL
LEUKOCYTES UA: NEGATIVE
Nitrite: NEGATIVE
PH: 5 (ref 5.0–8.0)
Protein, ur: 30 mg/dL — AB
Specific Gravity, Urine: 1.015 (ref 1.005–1.030)
Urobilinogen, UA: 0.2 mg/dL (ref 0.0–1.0)

## 2013-10-04 LAB — COMPREHENSIVE METABOLIC PANEL
ALT: 7 U/L (ref 0–53)
ANION GAP: 15 (ref 5–15)
AST: 13 U/L (ref 0–37)
Albumin: 2.4 g/dL — ABNORMAL LOW (ref 3.5–5.2)
Alkaline Phosphatase: 81 U/L (ref 39–117)
BILIRUBIN TOTAL: 2 mg/dL — AB (ref 0.3–1.2)
BUN: 21 mg/dL (ref 6–23)
CALCIUM: 8.7 mg/dL (ref 8.4–10.5)
CHLORIDE: 95 meq/L — AB (ref 96–112)
CO2: 23 meq/L (ref 19–32)
Creatinine, Ser: 1.53 mg/dL — ABNORMAL HIGH (ref 0.50–1.35)
GFR, EST AFRICAN AMERICAN: 53 mL/min — AB (ref >90)
GFR, EST NON AFRICAN AMERICAN: 46 mL/min — AB (ref >90)
Glucose, Bld: 126 mg/dL — ABNORMAL HIGH (ref 70–99)
Potassium: 4.2 mEq/L (ref 3.7–5.3)
Sodium: 133 mEq/L — ABNORMAL LOW (ref 137–147)
Total Protein: 7.5 g/dL (ref 6.0–8.3)

## 2013-10-04 LAB — PREPARE RBC (CROSSMATCH)

## 2013-10-04 LAB — HEMOGLOBIN A1C
Hgb A1c MFr Bld: 6.2 % — ABNORMAL HIGH (ref <5.7)
Mean Plasma Glucose: 131 mg/dL — ABNORMAL HIGH (ref <117)

## 2013-10-04 LAB — DIRECT ANTIGLOBULIN TEST (NOT AT ARMC)
DAT, IgG: NEGATIVE
DAT, complement: POSITIVE

## 2013-10-04 LAB — LEGIONELLA ANTIGEN, URINE: Legionella Antigen, Urine: NEGATIVE

## 2013-10-04 LAB — STREP PNEUMONIAE URINARY ANTIGEN: STREP PNEUMO URINARY ANTIGEN: NEGATIVE

## 2013-10-04 LAB — EXPECTORATED SPUTUM ASSESSMENT W REFEX TO RESP CULTURE

## 2013-10-04 LAB — RETICULOCYTES
RBC.: 2.83 MIL/uL — AB (ref 4.22–5.81)
RETIC CT PCT: 0.9 % (ref 0.4–3.1)
Retic Count, Absolute: 25.5 10*3/uL (ref 19.0–186.0)

## 2013-10-04 LAB — GLUCOSE, CAPILLARY: Glucose-Capillary: 125 mg/dL — ABNORMAL HIGH (ref 70–99)

## 2013-10-04 LAB — MRSA PCR SCREENING: MRSA by PCR: NEGATIVE

## 2013-10-04 LAB — HIV ANTIBODY (ROUTINE TESTING W REFLEX): HIV 1&2 Ab, 4th Generation: NONREACTIVE

## 2013-10-04 LAB — ABO/RH: ABO/RH(D): O POS

## 2013-10-04 LAB — HEMOGLOBIN AND HEMATOCRIT, BLOOD
HCT: 19.4 % — ABNORMAL LOW (ref 39.0–52.0)
HEMOGLOBIN: 6.7 g/dL — AB (ref 13.0–17.0)

## 2013-10-04 LAB — LACTATE DEHYDROGENASE: LDH: 176 U/L (ref 94–250)

## 2013-10-04 MED ORDER — ALLOPURINOL 100 MG PO TABS
100.0000 mg | ORAL_TABLET | Freq: Every day | ORAL | Status: DC | PRN
  Filled 2013-10-04: qty 1

## 2013-10-04 MED ORDER — HYDROXYZINE HCL 25 MG PO TABS
25.0000 mg | ORAL_TABLET | Freq: Four times a day (QID) | ORAL | Status: DC | PRN
  Administered 2013-10-04 – 2013-10-11 (×12): 25 mg via ORAL
  Filled 2013-10-04 (×12): qty 1

## 2013-10-04 MED ORDER — ENSURE COMPLETE PO LIQD
237.0000 mL | Freq: Three times a day (TID) | ORAL | Status: DC
  Administered 2013-10-04 – 2013-10-12 (×15): 237 mL via ORAL

## 2013-10-04 MED ORDER — SODIUM CHLORIDE 0.9 % IV SOLN
INTRAVENOUS | Status: DC
  Administered 2013-10-04 – 2013-10-07 (×9): via INTRAVENOUS
  Administered 2013-10-08: 1000 mL via INTRAVENOUS
  Administered 2013-10-08 – 2013-10-12 (×9): via INTRAVENOUS

## 2013-10-04 MED ORDER — PIPERACILLIN-TAZOBACTAM 3.375 G IVPB
3.3750 g | Freq: Three times a day (TID) | INTRAVENOUS | Status: DC
  Administered 2013-10-04 – 2013-10-12 (×25): 3.375 g via INTRAVENOUS
  Filled 2013-10-04 (×28): qty 50

## 2013-10-04 MED ORDER — VALACYCLOVIR HCL 500 MG PO TABS
1000.0000 mg | ORAL_TABLET | Freq: Every day | ORAL | Status: DC
  Administered 2013-10-04 – 2013-10-12 (×9): 1000 mg via ORAL
  Filled 2013-10-04 (×9): qty 2

## 2013-10-04 MED ORDER — HYDROMORPHONE HCL PF 1 MG/ML IJ SOLN
0.2500 mg | INTRAMUSCULAR | Status: DC | PRN
  Administered 2013-10-04 – 2013-10-06 (×8): 0.25 mg via INTRAVENOUS
  Filled 2013-10-04 (×9): qty 1

## 2013-10-04 MED ORDER — SODIUM CHLORIDE 0.9 % IV SOLN: Freq: Once | INTRAVENOUS | Status: DC

## 2013-10-04 MED ORDER — ACETAMINOPHEN 325 MG PO TABS
650.0000 mg | ORAL_TABLET | Freq: Four times a day (QID) | ORAL | Status: DC | PRN
  Administered 2013-10-04 – 2013-10-09 (×8): 650 mg via ORAL
  Filled 2013-10-04 (×8): qty 2

## 2013-10-04 MED ORDER — AMITRIPTYLINE HCL 50 MG PO TABS
50.0000 mg | ORAL_TABLET | Freq: Every day | ORAL | Status: DC
  Administered 2013-10-04 – 2013-10-11 (×8): 50 mg via ORAL
  Filled 2013-10-04: qty 2
  Filled 2013-10-04: qty 1
  Filled 2013-10-04: qty 2
  Filled 2013-10-04 (×5): qty 1
  Filled 2013-10-04 (×3): qty 2

## 2013-10-04 MED ORDER — HYDROCODONE-ACETAMINOPHEN 7.5-325 MG PO TABS
1.0000 | ORAL_TABLET | ORAL | Status: DC | PRN
  Administered 2013-10-04 (×2): 2 via ORAL
  Filled 2013-10-04 (×2): qty 2

## 2013-10-04 MED ORDER — DIPHENHYDRAMINE HCL 50 MG/ML IJ SOLN
12.5000 mg | Freq: Three times a day (TID) | INTRAMUSCULAR | Status: DC | PRN
  Administered 2013-10-04 – 2013-10-12 (×4): 12.5 mg via INTRAVENOUS
  Filled 2013-10-04 (×4): qty 1

## 2013-10-04 MED ORDER — BOOST / RESOURCE BREEZE PO LIQD: 1.0000 | ORAL | Status: DC

## 2013-10-04 MED ORDER — FLUTICASONE PROPIONATE 50 MCG/ACT NA SUSP
2.0000 | Freq: Every day | NASAL | Status: DC
  Administered 2013-10-04 – 2013-10-12 (×8): 2 via NASAL
  Filled 2013-10-04 (×3): qty 16

## 2013-10-04 MED ORDER — LATANOPROST 0.005 % OP SOLN
1.0000 [drp] | Freq: Every day | OPHTHALMIC | Status: DC
  Administered 2013-10-04 – 2013-10-11 (×9): 1 [drp] via OPHTHALMIC
  Filled 2013-10-04 (×2): qty 2.5

## 2013-10-04 MED ORDER — FUROSEMIDE 10 MG/ML IJ SOLN
20.0000 mg | Freq: Once | INTRAMUSCULAR | Status: AC
  Administered 2013-10-05: 20 mg via INTRAVENOUS
  Filled 2013-10-04: qty 2

## 2013-10-04 MED ORDER — BENZONATATE 100 MG PO CAPS
100.0000 mg | ORAL_CAPSULE | Freq: Three times a day (TID) | ORAL | Status: DC | PRN
Start: 2013-10-04 — End: 2013-10-12
  Administered 2013-10-04 – 2013-10-12 (×16): 100 mg via ORAL
  Filled 2013-10-04 (×16): qty 1

## 2013-10-04 MED ORDER — NICOTINE 21 MG/24HR TD PT24
21.0000 mg | MEDICATED_PATCH | Freq: Every day | TRANSDERMAL | Status: DC
  Administered 2013-10-04: 21 mg via TRANSDERMAL
  Filled 2013-10-04 (×9): qty 1

## 2013-10-04 MED ORDER — ENOXAPARIN SODIUM 30 MG/0.3ML ~~LOC~~ SOLN
30.0000 mg | SUBCUTANEOUS | Status: DC
  Administered 2013-10-04: 30 mg via SUBCUTANEOUS
  Filled 2013-10-04: qty 0.3

## 2013-10-04 MED ORDER — ASPIRIN 81 MG PO CHEW
162.0000 mg | CHEWABLE_TABLET | Freq: Every day | ORAL | Status: DC | PRN
  Administered 2013-10-05 – 2013-10-10 (×2): 162 mg via ORAL
  Filled 2013-10-04 (×5): qty 2

## 2013-10-04 MED ORDER — ALBUTEROL SULFATE (2.5 MG/3ML) 0.083% IN NEBU: 3.0000 mL | INHALATION_SOLUTION | RESPIRATORY_TRACT | Status: DC | PRN

## 2013-10-04 MED ORDER — PANTOPRAZOLE SODIUM 40 MG PO TBEC
40.0000 mg | DELAYED_RELEASE_TABLET | Freq: Every day | ORAL | Status: DC
  Filled 2013-10-04: qty 1

## 2013-10-04 NOTE — Progress Notes (Signed)
Addendum: Ordered followup hemoglobin after hemoglobin this morning dropped down to 7.7 from 9.28 hours earlier. Hemoglobin this afternoon further dropped to 6.7 and patient's blood pressures are now soft with a systolic in the 77S. Concern was for GI bleed versus bleeding into cavitary lesion in lung versus hemolysis. Spoke with pulmonary and given the fact that patient already dropped 2 units prior to admission and then now has dropped 3 units since, extremely unlikely given stable O2 saturations and  Size of cavitary lesion.transfusing 2 units packed red blood cells and transferred to step down unit. In addition, noted bilirubin elevated at 2.0. Suspect hemolysis. Spoke with hematology/oncology who will see patient. Have ordered haptoglobin, LDH, reticulocyte count, peripheral smear looking for hemolysis. Underlying cause unclear.
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
## 2013-10-04 NOTE — Progress Notes (Signed)
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 Relative 11 (*) 12 - 46 %   Monocytes Relative 10  3 - 12 %   Eosinophils Relative 6 (*) 0 - 5 %   Basophils Relative 0  0 - 1 %   Neutro Abs 10.3 (*) 1.7 - 7.7 K/uL   Lymphs Abs 1.6  0.7 - 4.0 K/uL   Monocytes Absolute 1.4 (*) 0.1 - 1.0 K/uL   Eosinophils Absolute 0.9 (*) 0.0 - 0.7 K/uL   Basophils Absolute 0.0  0.0 - 0.1 K/uL   RBC Morphology TARGET CELLS    COMPREHENSIVE METABOLIC PANEL     Status: Abnormal   Collection Time    10/03/13  8:26 PM      Result Value Ref Range   Sodium 131 (*) 137 - 147 mEq/L   Potassium 4.1  3.7 - 5.3 mEq/L   Chloride 92 (*) 96 - 112 mEq/L   CO2 22  19 - 32 mEq/L   Glucose, Bld 126 (*) 70 - 99 mg/dL   BUN 21  6 - 23 mg/dL    Creatinine, Ser 1.52 (*) 0.50 - 1.35 mg/dL   Calcium 9.1  8.4 - 10.5 mg/dL   Total Protein 8.7 (*) 6.0 - 8.3 g/dL   Albumin 2.8 (*) 3.5 - 5.2 g/dL   AST 11  0 - 37 U/L   ALT 7  0 - 53 U/L   Alkaline Phosphatase 96  39 - 117 U/L   Total Bilirubin 0.5  0.3 - 1.2 mg/dL   GFR calc non Af Amer 46 (*) >90 mL/min   GFR  calc Af Amer 54 (*) >90 mL/min   Comment: (NOTE)     The eGFR has been calculated using the CKD EPI equation.     This calculation has not been validated in all clinical situations.     eGFR's persistently <90 mL/min signify possible Chronic Kidney     Disease.   Anion gap 17 (*) 5 - 15  I-STAT CG4 LACTIC ACID, ED     Status: Abnormal   Collection Time    10/03/13  8:37 PM      Result Value Ref Range   Lactic Acid, Venous 2.48 (*) 0.5 - 2.2 mmol/L  URINALYSIS W MICROSCOPIC     Status: Abnormal   Collection Time    10/04/13 12:28 AM      Result Value Ref Range   Color, Urine YELLOW  YELLOW   APPearance CLEAR  CLEAR   Specific Gravity, Urine 1.015  1.005 - 1.030   pH 5.0  5.0 - 8.0   Glucose, UA NEGATIVE  NEGATIVE mg/dL   Hgb urine dipstick NEGATIVE  NEGATIVE   Bilirubin Urine NEGATIVE  NEGATIVE   Ketones, ur NEGATIVE  NEGATIVE mg/dL   Protein, ur 30 (*) NEGATIVE mg/dL   Urobilinogen, UA 0.2  0.0 - 1.0 mg/dL   Nitrite NEGATIVE  NEGATIVE   Leukocytes, UA NEGATIVE  NEGATIVE   WBC, UA 0-2  <3 WBC/hpf   RBC / HPF 0-2  <3 RBC/hpf   Bacteria, UA RARE  RARE   Squamous Epithelial / LPF RARE  RARE   Casts HYALINE CASTS (*) NEGATIVE  CULTURE, EXPECTORATED SPUTUM-ASSESSMENT     Status: None   Collection Time    10/04/13 12:48 AM      Result Value Ref Range   Specimen Description SPUTUM     Special Requests Immunocompromised     Sputum evaluation       Value: MICROSCOPIC FINDINGS SUGGEST THAT THIS SPECIMEN IS NOT REPRESENTATIVE OF LOWER RESPIRATORY SECRETIONS. PLEASE RECOLLECT. CALLED TO RIMUNDO,J RN AT [phone] 09.07.15 BY TIBBITTS,K   Report Status 10/04/2013 FINAL      LEGIONELLA ANTIGEN, URINE     Status: None   Collection Time    10/04/13  1:05 AM      Result Value Ref Range   Specimen Description URINE, CLEAN CATCH     Special Requests NONE     Legionella Antigen, Urine       Value: Negative for Legionella pneumophilia serogroup 1     Performed at [facility]   Report Status 10/04/2013 FINAL    STREP PNEUMONIAE URINARY ANTIGEN     Status: None   Collection Time    10/04/13  1:05 AM      Result Value Ref Range   Strep Pneumo Urinary Antigen NEGATIVE  NEGATIVE   Comment: PERFORMED AT [place]                Infection due to S. pneumoniae     cannot be absolutely ruled out     since the antigen present     may be below the detection limit     of the test.     Performed at [place], [place]     Status: Abnormal   Collection Time    10/04/13  1:11 AM      Result Value Ref Range   Glucose-Capillary 125 (*) 70 - 99 mg/dL  CULTURE, EXPECTORATED SPUTUM-ASSESSMENT     Status: None   Collection  Time    10/04/13  2:55 AM      Result Value Ref Range   Specimen Description SPUTUM     Special Requests NONE     Sputum evaluation       Value: THIS SPECIMEN IS ACCEPTABLE. RESPIRATORY CULTURE REPORT TO FOLLOW.   Report Status 10/04/2013 FINAL    FUNGUS CULTURE W SMEAR     Status: None   Collection Time    10/04/13  3:02 AM      Result Value Ref Range   Specimen Description SPUTUM     Special Requests Normal     Fungal Smear       Value: NO YEAST OR FUNGAL ELEMENTS SEEN     Performed at [facility]   Culture       Value: CULTURE IN PROGRESS FOR FOUR WEEKS     Performed at [facility]   Report Status PENDING    HIV ANTIBODY (ROUTINE TESTING)     Status: None   Collection Time    10/04/13  6:02 AM      Result Value Ref Range   HIV 1&2 Ab, 4th Generation NONREACTIVE  NONREACTIVE   Comment: (NOTE)     A NONREACTIVE HIV Ag/Ab result does not exclude HIV infection since     the time frame for  seroconversion is variable. If acute HIV infection     is suspected, a HIV-1 RNA Qualitative TMA test is recommended.     HIV-1/2 Antibody Diff         Not indicated.     HIV-1 RNA, Qual TMA           Not indicated.     PLEASE NOTE: This information has been disclosed to you from records     whose confidentiality may be protected by state law. If your state     requires such protection, then the state law prohibits you from making     any further disclosure of the information without the specific written     consent of the person to whom it pertains, or as otherwise permitted     by law. A general authorization for the release of medical or other     information is NOT sufficient for this purpose.     The performance of this assay has not been clinically validated in     patients less than 7 years old.     Performed at [place]     Status: Abnormal   Collection Time    10/04/13  6:02 AM      Result Value Ref Range   Sodium 133 (*) 137 - 147 mEq/L   Potassium 4.2  3.7 - 5.3 mEq/L   Chloride 95 (*) 96 - 112 mEq/L   CO2 23  19 - 32 mEq/L   Glucose, Bld 126 (*) 70 - 99 mg/dL   BUN 21  6 - 23 mg/dL   Creatinine, Ser 1.53 (*) 0.50 - 1.35 mg/dL   Calcium 8.7  8.4 - 10.5 mg/dL   Total Protein 7.5  6.0 - 8.3 g/dL   Albumin 2.4 (*) 3.5 - 5.2 g/dL   AST 13  0 - 37 U/L   ALT 7  0 - 53 U/L   Alkaline Phosphatase 81  39 - 117 U/L   Total Bilirubin 2.0 (*) 0.3 - 1.2 mg/dL   GFR calc non Af Amer 46 (*) >90 mL/min   GFR  calc Af Amer 53 (*) >90 mL/min   Comment: (NOTE)     The eGFR has been calculated using the CKD EPI equation.     This calculation has not been validated in all clinical situations.     eGFR's persistently <90 mL/min signify possible Chronic Kidney     Disease.   Anion gap 15  5 - 15  CBC WITH DIFFERENTIAL     Status: Abnormal   Collection Time    10/04/13  6:02 AM      Result Value Ref Range   WBC 15.9 (*) 4.0 - 10.5 K/uL   RBC  3.07 (*) 4.22 - 5.81 MIL/uL   Hemoglobin 7.7 (*) 13.0 - 17.0 g/dL   Comment: DELTA CHECK NOTED     REPEATED TO VERIFY   HCT 22.9 (*) 39.0 - 52.0 %   MCV 74.6 (*) 78.0 - 100.0 fL   MCH 25.1 (*) 26.0 - 34.0 pg   MCHC 33.6  30.0 - 36.0 g/dL   RDW 15.2  11.5 - 15.5 %   Platelets 404 (*) 150 - 400 K/uL   Neutrophils Relative % 78 (*) 43 - 77 %   Lymphocytes Relative 11 (*) 12 - 46 %   Monocytes Relative 7  3 - 12 %   Eosinophils Relative 4  0 - 5 %   Basophils Relative 0  0 - 1 %   Neutro Abs 12.5 (*) 1.7 - 7.7 K/uL   Lymphs Abs 1.7  0.7 - 4.0 K/uL   Monocytes Absolute 1.1 (*) 0.1 - 1.0 K/uL   Eosinophils Absolute 0.6  0.0 - 0.7 K/uL   Basophils Absolute 0.0  0.0 - 0.1 K/uL   WBC Morphology MILD LEFT SHIFT (1-5% METAS, OCC MYELO, OCC BANDS)     Comment: VACUOLATED NEUTROPHILS  HEMOGLOBIN A1C     Status: Abnormal   Collection Time    10/04/13  9:41 AM      Result Value Ref Range   Hemoglobin A1C 6.2 (*) <5.7 %   Comment: (NOTE)                                                                               According to the ADA Clinical Practice Recommendations for 2011, when     HbA1c is used as a screening test:      >=6.5%   Diagnostic of Diabetes Mellitus               (if abnormal result is confirmed)     5.7-6.4%   Increased risk of developing Diabetes Mellitus     References:Diagnosis and Classification of Diabetes Mellitus,Diabetes     NWGN,5621,30(QMVHQ 1):S62-S69 and Standards of Medical Care in             Diabetes - 2011,Diabetes Care,2011,34 (Suppl 1):S11-S61.   Mean Plasma Glucose 131 (*) <117 mg/dL   Comment: Performed at [place], BLOOD     Status: Abnormal   Collection Time    10/04/13  4:21 PM      Result Value Ref Range   Hemoglobin 6.7 (*) 13.0 - 17.0 g/dL   Comment: RESULT REPEATED AND VERIFIED  CRITICAL RESULT CALLED TO, READ BACK BY AND VERIFIED WITH:     DARK,D AT 1650 ON [id number] BY HOOKER,B   HCT 19.4 (*) 39.0 -  52.0 %  PREPARE RBC (CROSSMATCH)     Status: None   Collection Time    10/04/13  5:42 PM      Result Value Ref Range   Order Confirmation ORDER PROCESSED BY BLOOD BANK    DIRECT ANTIGLOBULIN TEST     Status: None   Collection Time    10/04/13  5:43 PM      Result Value Ref Range   DAT, complement POS     DAT, IgG NEG    TYPE AND SCREEN     Status: None   Collection Time    10/04/13  5:43 PM      Result Value Ref Range   ABO/RH(D) O POS     Antibody Screen NEG     Sample Expiration 10/07/2013     Unit Number [id number]     Blood Component Type RED CELLS,LR     Unit division 00     Status of Unit ALLOCATED     Transfusion Status OK TO TRANSFUSE     Crossmatch Result COMPATIBLE     Unit Number [id number]     Blood Component Type RBC LR PHER2     Unit division 00     Status of Unit ISSUED     Transfusion Status OK TO TRANSFUSE     Crossmatch Result COMPATIBLE    CBC WITH DIFFERENTIAL     Status: Abnormal   Collection Time    10/04/13  5:43 PM      Result Value Ref Range   WBC 13.1 (*) 4.0 - 10.5 K/uL   RBC 2.83 (*) 4.22 - 5.81 MIL/uL   Hemoglobin 7.3 (*) 13.0 - 17.0 g/dL   HCT 21.1 (*) 39.0 - 52.0 %   MCV 74.6 (*) 78.0 - 100.0 fL   MCH 25.8 (*) 26.0 - 34.0 pg   MCHC 34.6  30.0 - 36.0 g/dL   RDW 15.1  11.5 - 15.5 %   Platelets 382  150 - 400 K/uL   Neutrophils Relative % 67  43 - 77 %   Lymphocytes Relative 12  12 - 46 %   Monocytes Relative 6  3 - 12 %   Eosinophils Relative 15 (*) 0 - 5 %   Basophils Relative 0  0 - 1 %   Neutro Abs 8.7 (*) 1.7 - 7.7 K/uL   Lymphs Abs 1.6  0.7 - 4.0 K/uL   Monocytes Absolute 0.8  0.1 - 1.0 K/uL   Eosinophils Absolute 2.0 (*) 0.0 - 0.7 K/uL   Basophils Absolute 0.0  0.0 - 0.1 K/uL   Smear Review MORPHOLOGY UNREMARKABLE    LACTATE DEHYDROGENASE     Status: None   Collection Time    10/04/13  5:44 PM      Result Value Ref Range   LDH 176  94 - 250 U/L  RETICULOCYTES     Status: Abnormal   Collection Time    10/04/13  5:44  PM      Result Value Ref Range   Retic Ct Pct 0.9  0.4 - 3.1 %   RBC. 2.83 (*) 4.22 - 5.81 MIL/uL   Retic Count, Manual 25.5  19.0 - 186.0 K/uL  MRSA PCR SCREENING     Status: None   Collection Time    10/04/13  6:38  PM      Result Value Ref Range   MRSA by PCR NEGATIVE  NEGATIVE   Comment:            The GeneXpert MRSA Assay (FDA     approved for NASAL specimens     only), is one component of a     comprehensive MRSA colonization     surveillance program. It is not     intended to diagnose MRSA     infection nor to guide or     monitor treatment for     MRSA infections.  ABO/RH     Status: None   Collection Time    10/04/13  9:19 PM      Result Value Ref Range   ABO/RH(D) O POS      RADIOGRAPHIC STUDIES: I have personally reviewed the radiological images as listed and agreed with the findings in the report. Ct Chest Wo Contrast  10/04/2013   CLINICAL DATA:  Fever for 2 weeks. Diagnosed with fungal infection of lungs 2 weeks ago. Previous history of lung cancer with right lung resection. Cough. 30 lb weight loss over 1 month.  EXAM: CT CHEST WITHOUT CONTRAST  TECHNIQUE: Multidetector CT imaging of the chest was performed following the standard protocol without IV contrast.  COMPARISON:  12/05/2010  FINDINGS: Postoperative right partial pneumonectomy with surgical clips in the right axilla. Diffuse pulmonary hyperinflation consistent with emphysema. Scarring in the apices. The left lung demonstrates a cavitary lesion with an air-fluid level in the upper lung region, measuring about 5 cm diameter. There is diffuse surrounding airspace and interstitial infiltration throughout the left upper lung, lingula, and superior segment of left lower lung. Left apical gas collection could represent pre-existing emphysematous bulla. No pleural effusion. Changes are consistent with infectious or inflammatory process and could represent necrotizing pneumonia or atypical pneumonia with lung abscess.  Airways appear patent. No pneumothorax. Normal heart size. Calcification in the aorta. Esophagus is decompressed. No significant lymphadenopathy is noted in the chest.  Included portions of the upper abdominal organs demonstrate calcifications in the pancreas extensive vascular calcifications in the upper abdomen. Postoperative changes in the cervical spine.  IMPRESSION: Extensive infiltrative/inflammatory process in the left lung with large cavitary process with air-fluid level in the left upper lung. Changes likely to represent pneumonia or atypical pneumonia with cavitation due to abscess or necrotizing pneumonia. Followup after resolution of acute process is recommended to exclude underlying cavitary mass lesion.   Electronically Signed   By: Lucienne Capers M.D.   On: 10/04/2013 00:33                Dg Chest Port 1 View  (if Code Sepsis Called)  10/03/2013   CLINICAL DATA:  Fever.  COPD.  Right lung carcinoma.  EXAM: PORTABLE CHEST - 1 VIEW  COMPARISON:  09/16/13  FINDINGS: Increased airspace consolidation seen in the left upper lobe. Changes of COPD again demonstrated. No evidence of pneumothorax or pleural effusion. Surgical clips again seen in the right hilum.  IMPRESSION: Increased left upper lobe consolidation.  COPD.   Electronically Signed   By: Earle Gell M.D.   On: 10/03/2013 20:51       Dg Chest Port 1 View  09/16/2013   CLINICAL DATA:  68 year old male status post bronchoscopy, left upper lobe biopsy. Initial encounter.  EXAM: PORTABLE CHEST - 1 VIEW  COMPARISON:  09/01/2013 and earlier.  FINDINGS: Portable AP semi upright view at 0836 hrs. Slightly lower lung  volumes. Stable cardiac size and mediastinal contours. No pneumothorax or pleural effusion. Stable radiographic appearance of the large left upper lung lesion. Postoperative changes re - identified in the right upper lobe and at the right hilum. No areas of worsening ventilation.  IMPRESSION: No pneumothorax or new  cardiopulmonary abnormality identified status post bronchoscopic biopsy.   Electronically Signed   By: Lars Pinks M.D.   On: 09/16/2013 09:09       Dg C-arm Bronchoscopy  09/16/2013   CLINICAL DATA: bronch procedure   C-ARM BRONCHOSCOPY  Fluoroscopy was utilized by the requesting physician.  No radiographic  interpretation.    Time spent with patient doing history and physical and reviewing old chart and imaging studies and formulating a plan: 60 minutes.     Bernadene Bell, MD Medical Hematologist/Oncologist [place] Pager: [phone] Office No: [phone]

## 2013-10-04 NOTE — Progress Notes (Signed)
PROGRESS NOTE  [name] [name] GYF:[id number] DOB: 10/24/1945 DOA: 10/03/2013 PCP: Garnet Koyanagi, DO  HPI/Recap of past 24 hours: 68 year old male with past medical history of hypertension, COPD with ongoing tobacco abuse and peripheral vascular disease who is had a 3 month course of fever and cough despite multiple courses of antibiotics. Seen by pulmonary and bronchoscopy noted large amount of necrotic tissue with negative cultures to date. He presented to the emergency room with complaints of weight loss, fever and cough and admitted to the hospitalist service. Pulmonary and infectious disease or consulted. Patient placed on airborne precautions and AFB sputums (which have been checked in the past and negative to date) sent.  Started on broad-spectrum antibiotics and there is a suspicion he may have aspergillus. Started on voriconazole. Also of note, patient's hemoglobin on admission was 9.3 usually ranges 11-12  Assessment/Plan: Active Problems:   Hypertension: Holding oral antihypertensives.    Peripheral vascular disease: Stable.    Glaucoma: Continue drops.    Smoker: Started patient on nicotine patch.    Necrotizing pneumonia/lung abscess: Seen by infectious disease and pulmonary. Appreciate help. Suspected aspergillus infection. Started on voriconazole.    Protein-calorie malnutrition, severe: Patient needs criteria for severe malnutrition in the context of chronic illness with severe loss of muscle mass and body fat along with intake less than 75% for greater than one month of weight loss of 14% in the past 5 months   chronic kidney disease stage III: At baseline.  Acute anemia in the setting of chronic microcytic anemia: Patient is a history of alcohol use and that plus chronic kidney disease is likely cause of microcytic anemia. However, his hemoglobin usually ranges around 11-12. Presented to the emergency room with hemoglobin of 9.3 and this morning down to 7.7. Will heme test all  stools and follow serial hemoglobins. Patient denies any black tarry stool or noted any blood in his stool.  Code Status: Full code  Family Communication: No family present  Disposition Plan: Continue pulmonary workup. Home once better stabilized, absence of fevers   Consultants:  Infectious disease  Pulmonary  Procedures:  None  Antibiotics:  Cefepime x1 dose 9/6  Vancomycin 9/6-present  Voriconazole 9/7x2 doses  Zosyn 9/7-present  Valtrex 9/7-present  HPI/Subjective: Patient irritable. Does not feel good. Does not feel any better, complains of hurting all over  Objective: BP 107/62  Pulse 107  Temp(Src) 99.9 F (37.7 C) (Oral)  Resp 17  Ht 5\' 6"  (1.676 m)  Wt 53 kg (116 lb 13.5 oz)  BMI 18.87 kg/m2  SpO2 98%  Intake/Output Summary (Last 24 hours) at 10/04/13 1257 Last data filed at 10/04/13 0646  Gross per 24 hour  Intake 929.67 ml  Output    550 ml  Net 379.67 ml   Filed Weights   10/03/13 2030 10/04/13 0040  Weight: 54.432 kg (120 lb) 53 kg (116 lb 13.5 oz)    Exam:   General:  Irritable, withdrawn, emaciated  Cardiovascular: Regular rate and rhythm, S1-S2  Respiratory: Decreased breath sounds throughout, more so bibasilar  Abdomen: Soft, nontender, nondistended, positive bowel sounds  Musculoskeletal: No clubbing or cyanosis or edema  Data Reviewed: Basic Metabolic Panel:  Recent Labs Lab 10/03/13 2026 10/04/13 0602  NA 131* 133*  K 4.1 4.2  CL 92* 95*  CO2 22 23  GLUCOSE 126* 126*  BUN 21 21  CREATININE 1.52* 1.53*  CALCIUM 9.1 8.7   Liver Function Tests:  Recent Labs Lab 10/03/13 2026 10/04/13 0602  AST 11 13  ALT 7 7  ALKPHOS 96 81  BILITOT 0.5 2.0*  PROT 8.7* 7.5  ALBUMIN 2.8* 2.4*   No results found for this basename: LIPASE, AMYLASE,  in the last 168 hours No results found for this basename: AMMONIA,  in the last 168 hours CBC:  Recent Labs Lab 10/03/13 2026 10/04/13 0602  WBC 14.2* 15.9*  NEUTROABS  10.3* 12.5*  HGB 9.3* 7.7*  HCT 26.6* 22.9*  MCV 74.1* 74.6*  PLT 465* 404*   Cardiac Enzymes:   No results found for this basename: CKTOTAL, CKMB, CKMBINDEX, TROPONINI,  in the last 168 hours BNP (last 3 results) No results found for this basename: PROBNP,  in the last 8760 hours CBG:  Recent Labs Lab 10/04/13 0111  GLUCAP 125*       Studies: Dg Chest Port 1 View  (if Code Sepsis Called)  10/03/2013    IMPRESSION: Increased left upper lobe consolidation.  COPD.   Electronically Signed   By: Earle Gell M.D.   On: 10/03/2013 20:51    Scheduled Meds: . amitriptyline  50 mg Oral Daily  . enoxaparin (LOVENOX) injection  30 mg Subcutaneous Q24H  . feeding supplement (ENSURE COMPLETE)  237 mL Oral TID BM  . feeding supplement (RESOURCE BREEZE)  1 Container Oral Q24H  . fluticasone  2 spray Each Nare Daily  . latanoprost  1 drop Both Eyes QHS  . pantoprazole  40 mg Oral Q1200  . piperacillin-tazobactam (ZOSYN)  IV  3.375 g Intravenous Q8H  . valACYclovir  1,000 mg Oral Daily  . vancomycin  750 mg Intravenous Q24H  . voriconazole  6 mg/kg Intravenous Q12H    Continuous Infusions: . sodium chloride 100 mL/hr at 10/04/13 0056     Time spent: 35 minutes  [facility] Pager [phone]. If 7PM-7AM, please contact night-coverage at [email or web address], password [facility] 10/04/2013, 12:57 PM  LOS: 1 day
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
## 2013-10-04 NOTE — Progress Notes (Signed)
Pt followup visit from earlier this am.  Remains stable, though is febrile.  Most likely dx is extensive lung abscess/bullitis, but with tissue necrosis and fungal elements seen on TBBX, must cover semi-invasive aspergillosis.  Very unlikely to be TB with recent BAL at bronch with negative AFB.

## 2013-10-04 NOTE — Progress Notes (Signed)
CRITICAL VALUE ALERT  Critical value received: Hgb 6.7  Date of notification:  10/04/13  Time of notification:  1650  Critical value read back:Yes.    Nurse who received alert:  D.Jaisa Defino,RN  MD notified (1st page):  Dr. Maryland Pink  Time of first page:  1655  Responding MD:  Dr. Maryland Pink  Time MD responded:  1658  Orders received
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
## 2013-10-05 ENCOUNTER — Encounter: Payer: Self-pay | Admitting: Internal Medicine

## 2013-10-05 DIAGNOSIS — D599 Acquired hemolytic anemia, unspecified: Secondary | ICD-10-CM

## 2013-10-05 DIAGNOSIS — N183 Chronic kidney disease, stage 3 unspecified: Secondary | ICD-10-CM

## 2013-10-05 DIAGNOSIS — Z8546 Personal history of malignant neoplasm of prostate: Secondary | ICD-10-CM

## 2013-10-05 DIAGNOSIS — Z8709 Personal history of other diseases of the respiratory system: Secondary | ICD-10-CM

## 2013-10-05 DIAGNOSIS — J852 Abscess of lung without pneumonia: Secondary | ICD-10-CM

## 2013-10-05 DIAGNOSIS — D638 Anemia in other chronic diseases classified elsewhere: Secondary | ICD-10-CM

## 2013-10-05 LAB — COMPREHENSIVE METABOLIC PANEL
ALK PHOS: 85 U/L (ref 39–117)
ALT: 8 U/L (ref 0–53)
AST: 19 U/L (ref 0–37)
Albumin: 2.3 g/dL — ABNORMAL LOW (ref 3.5–5.2)
Anion gap: 15 (ref 5–15)
BILIRUBIN TOTAL: 0.9 mg/dL (ref 0.3–1.2)
BUN: 17 mg/dL (ref 6–23)
CHLORIDE: 97 meq/L (ref 96–112)
CO2: 24 meq/L (ref 19–32)
Calcium: 9.2 mg/dL (ref 8.4–10.5)
Creatinine, Ser: 1.48 mg/dL — ABNORMAL HIGH (ref 0.50–1.35)
GFR calc Af Amer: 56 mL/min — ABNORMAL LOW (ref >90)
GFR calc non Af Amer: 48 mL/min — ABNORMAL LOW (ref >90)
Glucose, Bld: 116 mg/dL — ABNORMAL HIGH (ref 70–99)
Potassium: 4.1 mEq/L (ref 3.7–5.3)
SODIUM: 136 meq/L — AB (ref 137–147)
Total Protein: 7.4 g/dL (ref 6.0–8.3)

## 2013-10-05 LAB — PROTIME-INR
INR: 1.3 (ref 0.00–1.49)
Prothrombin Time: 16.2 seconds — ABNORMAL HIGH (ref 11.6–15.2)

## 2013-10-05 LAB — FERRITIN: Ferritin: 1150 ng/mL — ABNORMAL HIGH (ref 22–322)

## 2013-10-05 LAB — CBC
HCT: 28.2 % — ABNORMAL LOW (ref 39.0–52.0)
Hemoglobin: 10 g/dL — ABNORMAL LOW (ref 13.0–17.0)
MCH: 26.7 pg (ref 26.0–34.0)
MCHC: 35.5 g/dL (ref 30.0–36.0)
MCV: 75.2 fL — ABNORMAL LOW (ref 78.0–100.0)
PLATELETS: 369 10*3/uL (ref 150–400)
RBC: 3.75 MIL/uL — AB (ref 4.22–5.81)
RDW: 15.3 % (ref 11.5–15.5)
WBC: 18.3 10*3/uL — ABNORMAL HIGH (ref 4.0–10.5)

## 2013-10-05 LAB — URINE CULTURE
Colony Count: NO GROWTH
Culture: NO GROWTH

## 2013-10-05 LAB — IRON AND TIBC
Iron: 10 ug/dL — ABNORMAL LOW (ref 42–135)
SATURATION RATIOS: 6 % — AB (ref 20–55)
TIBC: 156 ug/dL — ABNORMAL LOW (ref 215–435)
UIBC: 146 ug/dL (ref 125–400)

## 2013-10-05 LAB — HAPTOGLOBIN: Haptoglobin: 369 mg/dL — ABNORMAL HIGH (ref 45–215)

## 2013-10-05 LAB — ANCA SCREEN W REFLEX TITER
Atypical p-ANCA Screen: NEGATIVE
C-ANCA SCREEN: NEGATIVE
p-ANCA Screen: NEGATIVE

## 2013-10-05 LAB — HEPATITIS B CORE ANTIBODY, IGM: HEP B C IGM: NONREACTIVE

## 2013-10-05 LAB — VITAMIN B12: VITAMIN B 12: 444 pg/mL (ref 211–911)

## 2013-10-05 LAB — APTT: APTT: 48 s — AB (ref 24–37)

## 2013-10-05 LAB — ANA: ANA: NEGATIVE

## 2013-10-05 LAB — HEPATITIS B SURFACE ANTIGEN: HEP B S AG: NEGATIVE

## 2013-10-05 LAB — FOLATE: Folate: 14.4 ng/mL

## 2013-10-05 MED ORDER — VORICONAZOLE 200 MG PO TABS
200.0000 mg | ORAL_TABLET | Freq: Two times a day (BID) | ORAL | Status: DC
  Administered 2013-10-05 – 2013-10-12 (×14): 200 mg via ORAL
  Filled 2013-10-05 (×17): qty 1

## 2013-10-05 MED ORDER — ONDANSETRON HCL 4 MG/2ML IJ SOLN
4.0000 mg | Freq: Three times a day (TID) | INTRAMUSCULAR | Status: DC | PRN
  Administered 2013-10-05 – 2013-10-06 (×3): 4 mg via INTRAVENOUS
  Filled 2013-10-05 (×3): qty 2

## 2013-10-05 MED ORDER — HYDROMORPHONE HCL PF 1 MG/ML IJ SOLN
0.2500 mg | Freq: Once | INTRAMUSCULAR | Status: AC
Start: 2013-10-05 — End: 2013-10-05
  Administered 2013-10-05: 0.25 mg via INTRAVENOUS

## 2013-10-05 NOTE — Progress Notes (Signed)
NUTRITION FOLLOW UP/CONSULT FOR ASSESSMENT   Intervention:   - Continue Ensure Complete TID - Encouraged excellent meal intake - RD to continue to monitor   Nutrition Dx:   Inadequate oral intake related to poor appetite as evidenced by patient report and observation - ongoing   Goal:   Intake of meals and supplements to meet >90% estimated needs - not met  Monitor:   Weights, labs, intake  Assessment:   History of lung cancer s/p resection of RUL who was referred to [facility] pulmonary for a cavitary lung lesion in July 2015. He was treated with a course of biaxin and a course of augmentin with no improvement. He underwent bronchoscopy with transbronchial biopsies on 8/21. BAL grew OP flora and transbronchial biopsies showed necrotic tissue with fungal elements. Fungal smear was negative, culture is still pending. He has continued to have fevers and cough since his bronchoscopy. He coughs up yellow/green sputum. He has never had hemoptysis. He reports a 25 pound weight loss over the last 2 months. He does have night sweats and fatigue. He states that he has been evaluated for TB at the health department and was "cleared". He does not have dysphagia or recall any aspiration events. 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 He has not travelled outside of [place] recently. He has been to the [place] but not since the 1990s. He does still smoke. Admitted with lung abscess   9/7:  -Poor appetite and intake for the past month.  -UBW per pt 145 lbs 1 month ago not confirmed by echart,  -Per e-chart wt hx, patient with 19 lb weight loss in the past 5 months (14%)  -Patient appears malnourished, very hallowed cheeks, decreased fat stores and muscle mass.  -Reports stomach pain with high fat foods. (hx of pancreatitis)  9/8: -Diet was clear liquid during RD visit this morning -Met with pt who said he drank 2 Ensure Completes yesterday, 1 so far  today -Eager to order solid food, now on 2g sodium diet    Height: Ht Readings from Last 1 Encounters:  10/04/13 _0  (1.702 m)    Weight Status:   Wt Readings from Last 1 Encounters:  10/04/13 122 lb 9.2 oz (55.6 kg)    Re-estimated needs:  Kcal: 1600-1800  Protein: 85-95 gm  Fluid: >1.6L   Skin: intact  Diet Order: Sodium Restricted   Intake/Output Summary (Last 24 hours) at 10/05/13 1151 Last data filed at 10/05/13 1029  Gross per 24 hour  Intake 3468.75 ml  Output   9006 ml  Net -5537.25 ml    Last BM: 9/6   Labs:   Recent Labs Lab 10/03/13 2026 10/04/13 0602 10/05/13 1015  NA 131* 133* 136*  K 4.1 4.2 4.1  CL 92* 95* 97  CO2 _1 BUN _2 CREATININE 1.52* 1.53* 1.48*  CALCIUM 9.1 8.7 9.2  GLUCOSE 126* 126* 116*    CBG (last 3)   Recent Labs  10/04/13 0111  GLUCAP 125*    Scheduled Meds: . sodium chloride   Intravenous Once  . amitriptyline  50 mg Oral Daily  . feeding supplement (ENSURE COMPLETE)  237 mL Oral TID BM  . feeding supplement (RESOURCE BREEZE)  1 Container Oral Q24H  . fluticasone  2 spray Each Nare Daily  . latanoprost  1 drop Both Eyes QHS  . nicotine  21 mg Transdermal Daily  . piperacillin-tazobactam (ZOSYN)  IV  3.375 g Intravenous Q8H  . valACYclovir  1,000 mg Oral Daily  . vancomycin  750 mg Intravenous Q24H    Continuous Infusions: . sodium chloride 100 mL/hr at 10/05/13 [address] [place], RD, [place] Pager [phone] Weekend/After Hours Pager

## 2013-10-05 NOTE — Progress Notes (Signed)
Patient temperature 102.3 after 1st full hour of blood transfusion. Dr. Jimmy Footman notified. MD aware that patient has been febrile in past 12 hours. No new orders. Dr. Jimmy Footman approved continuation of PRBC, no need to stop transfusion.

## 2013-10-05 NOTE — Progress Notes (Signed)
CARE MANAGEMENT NOTE 10/05/2013  Patient:  Spranger,Jonus   Account Number:  [id number]  Date Initiated:  10/05/2013  Documentation initiated by:  Lucion Dilger  Subjective/Objective Assessment:   pt with lung ca and lung abcess     Action/Plan:   home when stable   Anticipated DC Date:  10/08/2013   Anticipated DC Plan:  HOME/SELF CARE  In-house referral  NA      DC Planning Services  CM consult      PAC Choice  NA   Choice offered to / List presented to:  NA      DME agency  NA     [place] arranged  NA      [place] agency  NA   Status of service:  In process, will continue to follow Medicare Important Message given?   (If response is "NO", the following Medicare IM given date fields will be blank) Date Medicare IM given:   Medicare IM given by:   Date Additional Medicare IM given:   Additional Medicare IM given by:    Discharge Disposition:    Per UR Regulation:  Reviewed for med. necessity/level of care/duration of stay  If discussed at [place] of Stay Meetings, dates discussed:    Comments:  Suanne Marker Enzo Treu,RN,BSN,CCM

## 2013-10-05 NOTE — Progress Notes (Addendum)
PROGRESS NOTE  [name] [name] ZOX:[id number] DOB: 09-06-1945 DOA: 10/03/2013 PCP: Garnet Koyanagi, DO  HPI/Recap of past 26 hours: 68 year old male with past medical history of hypertension, COPD with ongoing tobacco abuse and peripheral vascular disease who is had a 3 month course of fever and cough despite multiple courses of antibiotics. Seen by pulmonary in the office & s/p bronchoscopy noted large amount of necrotic tissue with negative cultures to date. He presented to the emergency room on 9/6  with complaints of weight loss, fever and cough and admitted to the hospitalist service. Pulmonary and infectious disease or consulted. Patient placed on airborne precautions and AFB sputums (which have been checked in the past and negative to date) sent.  Started on broad-spectrum antibiotics and there is a suspicion he may have aspergillus. Started on voriconazole.  On admission, patient's hemoglobin on admission was 9.3 usually ranges 11-12.  Hgb had dropped to 7.7 the following morning, 10 hours later, to 7.7. No signs of GI bleed.  A repeat hemoglobin in the afternoon had further dropped to 6.7. Pressure is slightly soft so patient moved to step down unit and received 2 units packed red blood cells. Noted bilirubin was increased to 2.0 and no strong suspicion the patient was hemolyzing. Hematology consulted and workup initiated.  This morning, patient feeling about the same. Fatigued. He is having a tough time with pain medication as it causes severe itching and Benadryl not relieving this. Continued productive cough.  Assessment/Plan: Active Problems:   Hypertension: Holding oral antihypertensives. Blood pressures remained soft.    Peripheral vascular disease: Stable.    Glaucoma: Continue drops.    Smoker: Started patient on nicotine patch.    Necrotizing pneumonia/lung abscess: Seen by infectious disease and pulmonary. Appreciate help. Suspected aspergillus infection. Started on  voriconazole.    Protein-calorie malnutrition, severe: Patient needs criteria for severe malnutrition in the context of chronic illness with severe loss of muscle mass and body fat along with intake less than 75% for greater than one month of weight loss of 14% in the past 5 months. Seen by nutrition. On Ensure by mouth 3 times a day plus resource daily.   chronic kidney disease stage III: At baseline.  Acute anemia-suspected hemolysis in the setting of chronic microcytic anemia: Patient is a history of alcohol use and that plus chronic kidney disease is likely cause of microcytic anemia. Given the sudden drop plus rapid improvement leads more likely to hemolytic process. Workup in progress, appreciate assistance  At this time, this patient has SIRS with sepsis. His temperature is likely from his underlying lung process, that likely his worsening leukocytosis is from stress margination hemolytic process and elevated heart rate more from sudden drop in hemoglobin.  Code Status: Full code  Family Communication: No family present  Disposition Plan: Continue pulmonary and hemolysis workup.  If blood pressure remains stable, transfer upstairs to telemetry later today.  Consultants:  Infectious disease  Pulmonary  Hematology  Procedures:  None  Antibiotics:  Cefepime x1 dose 9/6  Vancomycin 9/6-present  Voriconazole 9/7x2 doses  Zosyn 9/7-present  Valtrex 9/7-present  HPI/Subjective: Patient doing okay. Complains of itching all over with pain medication. Continue pain in his chest when he coughs  Objective: BP 101/59  Pulse 110  Temp(Src) 100.2 F (37.9 C) (Oral)  Resp 27  Ht 5\' 7"  (1.702 m)  Wt 55.6 kg (122 lb 9.2 oz)  BMI 19.19 kg/m2  SpO2 100%  Intake/Output Summary (Last 24 hours) at 10/05/13 1035  Last data filed at 10/05/13 1029  Gross per 24 hour  Intake 3343.75 ml  Output   9006 ml  Net -5662.25 ml   Filed Weights   10/04/13 0040 10/04/13 1844 10/04/13  1849  Weight: 53 kg (116 lb 13.5 oz) 55.6 kg (122 lb 9.2 oz) 55.6 kg (122 lb 9.2 oz)    Exam:   General:  Emaciated, mild distress from pain from coughing  Cardiovascular: Regular rate and rhythm, S1-S2  Respiratory: Decreased breath sounds throughout, few rhonchi  Abdomen: Soft, nontender, nondistended, positive bowel sounds  Musculoskeletal: No clubbing or cyanosis or edema  Data Reviewed: Basic Metabolic Panel:  Recent Labs Lab 10/03/13 2026 10/04/13 0602  NA 131* 133*  K 4.1 4.2  CL 92* 95*  CO2 22 23  GLUCOSE 126* 126*  BUN 21 21  CREATININE 1.52* 1.53*  CALCIUM 9.1 8.7   Liver Function Tests:  Recent Labs Lab 10/03/13 2026 10/04/13 0602  AST 11 13  ALT 7 7  ALKPHOS 96 81  BILITOT 0.5 2.0*  PROT 8.7* 7.5  ALBUMIN 2.8* 2.4*   No results found for this basename: LIPASE, AMYLASE,  in the last 168 hours No results found for this basename: AMMONIA,  in the last 168 hours CBC:  Recent Labs Lab 10/03/13 2026 10/04/13 0602 10/04/13 1621 10/04/13 1743 10/05/13 1015  WBC 14.2* 15.9*  --  13.1* 18.3*  NEUTROABS 10.3* 12.5*  --  8.7*  --   HGB 9.3* 7.7* 6.7* 7.3* 10.0*  HCT 26.6* 22.9* 19.4* 21.1* 28.2*  MCV 74.1* 74.6*  --  74.6* 75.2*  PLT 465* 404*  --  382 369   Cardiac Enzymes:   No results found for this basename: CKTOTAL, CKMB, CKMBINDEX, TROPONINI,  in the last 168 hours BNP (last 3 results) No results found for this basename: PROBNP,  in the last 8760 hours CBG:  Recent Labs Lab 10/04/13 0111  GLUCAP 125*       Studies: Dg Chest Port 1 View  (if Code Sepsis Called)  10/03/2013    IMPRESSION: Increased left upper lobe consolidation.  COPD.   Electronically Signed   By: Earle Gell M.D.   On: 10/03/2013 20:51    Scheduled Meds: . sodium chloride   Intravenous Once  . amitriptyline  50 mg Oral Daily  . feeding supplement (ENSURE COMPLETE)  237 mL Oral TID BM  . feeding supplement (RESOURCE BREEZE)  1 Container Oral Q24H  .  fluticasone  2 spray Each Nare Daily  . latanoprost  1 drop Both Eyes QHS  . nicotine  21 mg Transdermal Daily  . piperacillin-tazobactam (ZOSYN)  IV  3.375 g Intravenous Q8H  . valACYclovir  1,000 mg Oral Daily  . vancomycin  750 mg Intravenous Q24H    Continuous Infusions: . sodium chloride 100 mL/hr at 10/05/13 1027     Time spent: 25 minutes  [facility] Pager [phone]. If 7PM-7AM, please contact night-coverage at [email or web address], password [facility] 10/05/2013, 10:35 AM  LOS: 2 days

## 2013-10-05 NOTE — Progress Notes (Signed)
PULMONARY / CRITICAL CARE MEDICINE   Name: [name] [name] MRN: [id number] DOB: 02-15-1945    ADMISSION DATE:  10/03/2013 CONSULTATION DATE:  10/04/2013  REFERRING MD :  Darnell Level  CHIEF COMPLAINT:  Lung abscess  INITIAL PRESENTATION:  Mr. [name] [name] is a 68 y/o man with a history of lung cancer s/p resection of RUL who was referred to [facility] pulmonary for a cavitary lung lesion left upper lobe in July 2015. Presented to [facility] ED 9/6 with fever, weight loss and cough since July      SIGNIFICANT EVENTS: 9/7 moved to ICU for hypotension and blood loss of unclear etiology . CT - large left upper lobe cavitary lesion  SUBJECTIVE:  No distress.  Still coughing up copious purulent sputum  VITAL SIGNS: Temp:  [99 F (37.2 C)-103.2 F (39.6 C)] 100.2 F (37.9 C) (09/08 0737) Pulse Rate:  [103-124] 110 (09/08 0730) Resp:  [14-32] 27 (09/08 0730) BP: (80-138)/(43-74) 101/59 mmHg (09/08 0730) SpO2:  [85 %-100 %] 100 % (09/08 0730) Weight:  [55.6 kg (122 lb 9.2 oz)] 55.6 kg (122 lb 9.2 oz) (09/07 1849) HEMODYNAMICS:   VENTILATOR SETTINGS:   INTAKE / OUTPUT:  Intake/Output Summary (Last 24 hours) at 10/05/13 0944 Last data filed at 10/05/13 0739  Gross per 24 hour  Intake 3043.75 ml  Output   3006 ml  Net  37.75 ml    PHYSICAL EXAMINATION: General:  Thin man, laying in bed, no acute distress Neuro:  A & O x3, CNII-XII intact,  HEENT:  PERRL, EOMI, OP clear Cardiovascular:  NRRR, no MRG Lungs:  Diminished breath sounds in LUL, scattered rhonchi  Abdomen:  Soft, NTND, +BS, no HSM Musculoskeletal:  No CCE Skin:  No bruising, rashes or other lesions.   LABS: PULMONARY No results found for this basename: PHART, PCO2, PCO2ART, PO2, PO2ART, HCO3, TCO2, O2SAT,  in the last 168 hours  CBC  Recent Labs Lab 10/03/13 2026 10/04/13 0602 10/04/13 1621 10/04/13 1743  HGB 9.3* 7.7* 6.7* 7.3*  HCT 26.6* 22.9* 19.4* 21.1*  WBC 14.2* 15.9*  --  13.1*  PLT 465* 404*  --  382     COAGULATION No results found for this basename: INR,  in the last 168 hours  CARDIAC  No results found for this basename: TROPONINI,  in the last 168 hours No results found for this basename: PROBNP,  in the last 168 hours   CHEMISTRY  Recent Labs Lab 10/03/13 2026 10/04/13 0602  NA 131* 133*  K 4.1 4.2  CL 92* 95*  CO2 22 23  GLUCOSE 126* 126*  BUN 21 21  CREATININE 1.52* 1.53*  CALCIUM 9.1 8.7   Estimated Creatinine Clearance: 37.9 ml/min (by C-G formula based on Cr of 1.53).   LIVER  Recent Labs Lab 10/03/13 2026 10/04/13 0602  AST 11 13  ALT 7 7  ALKPHOS 96 81  BILITOT 0.5 2.0*  PROT 8.7* 7.5  ALBUMIN 2.8* 2.4*     INFECTIOUS  Recent Labs Lab 10/03/13 2037  LATICACIDVEN 2.48*     ENDOCRINE CBG (last 3)   Recent Labs  10/04/13 0111  GLUCAP 125*         IMAGING x48h Ct Chest Wo Contrast  10/04/2013   CLINICAL DATA:  Fever for 2 weeks. Diagnosed with fungal infection of lungs 2 weeks ago. Previous history of lung cancer with right lung resection. Cough. 30 lb weight loss over 1 month.  EXAM: CT CHEST WITHOUT CONTRAST  TECHNIQUE: Multidetector CT imaging of  the chest was performed following the standard protocol without IV contrast.  COMPARISON:  12/05/2010  FINDINGS: Postoperative right partial pneumonectomy with surgical clips in the right axilla. Diffuse pulmonary hyperinflation consistent with emphysema. Scarring in the apices. The left lung demonstrates a cavitary lesion with an air-fluid level in the upper lung region, measuring about 5 cm diameter. There is diffuse surrounding airspace and interstitial infiltration throughout the left upper lung, lingula, and superior segment of left lower lung. Left apical gas collection could represent pre-existing emphysematous bulla. No pleural effusion. Changes are consistent with infectious or inflammatory process and could represent necrotizing pneumonia or atypical pneumonia with lung abscess.  Airways appear patent. No pneumothorax. Normal heart size. Calcification in the aorta. Esophagus is decompressed. No significant lymphadenopathy is noted in the chest.  Included portions of the upper abdominal organs demonstrate calcifications in the pancreas extensive vascular calcifications in the upper abdomen. Postoperative changes in the cervical spine.  IMPRESSION: Extensive infiltrative/inflammatory process in the left lung with large cavitary process with air-fluid level in the left upper lung. Changes likely to represent pneumonia or atypical pneumonia with cavitation due to abscess or necrotizing pneumonia. Followup after resolution of acute process is recommended to exclude underlying cavitary mass lesion.   Electronically Signed   By: Lucienne Capers M.D.   On: 10/04/2013 00:33      ASSESSMENT / PLAN:  LUL lung abscess/bullitis Necrotizing PNA Has undergone bronchoscopy and transbronchial biopsies however etiology is still unclear. 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   Doubt Tb   - staff MD note : the features of this despite bronch showing fungal elements (the chronicity, cachexia, weight loss and CT findings and the ongoing copiopus sputum ) are all c.w lung abscess / necrotizing penumonia from anaerobels  Plan Ideally this size dictates resection but doubt he can tolerate cont voriconazole and f/u aspergillus antigen and antibody for further evaluation. Cont broad spectrum abx pending results of bacterial sputum culture (already pending).   Vanc/zosyn 9/6>>> f/u sputum for AFB and Quantiferon Gold to r/o TB f/u ANCA and ANA as well since vasculitis can present as cavitary lesions.   Acute anemia . Being followed by heme/onc. Felt to be multifactorial: feso4 def, anemia of renal failure, and hemolysis. Heme/onc does not think med related. To date no blood loss noted from GI source Plan Per heme/onc and med service -  PCCM recommends strongly prbc only if hgb < 7gm% per ICU  guidelines and 1 unit at a time only     Dr. Brand Males, M.D., [facility].C.P Pulmonary and Critical Care Medicine Staff Physician [facility] Pager: [phone], If no answer or between  15:00h - 7:00h: call [phone]  10/05/2013 10:11 AM

## 2013-10-05 NOTE — Progress Notes (Signed)
Patient ID: [name] [name], male   DOB: 10-03-1945, 68 y.o.   MRN: [id number]         [place] for Infectious Disease    Date of Admission:  10/03/2013           Day 3 vancomycin        Day 3 piperacillin tazobactam        Day 3 voriconazole Active Problems:   Hypertension   Peripheral vascular disease   Glaucoma   Smoker   Necrotizing pneumonia   Hospital acquired PNA   Protein-calorie malnutrition, severe   Microcytic anemia   CKD (chronic kidney disease) stage 3, GFR 30-59 ml/min   History of lung abscess   Acute hemolytic anemia   . sodium chloride   Intravenous Once  . amitriptyline  50 mg Oral Daily  . feeding supplement (ENSURE COMPLETE)  237 mL Oral TID BM  . fluticasone  2 spray Each Nare Daily  . latanoprost  1 drop Both Eyes QHS  . nicotine  21 mg Transdermal Daily  . piperacillin-tazobactam (ZOSYN)  IV  3.375 g Intravenous Q8H  . valACYclovir  1,000 mg Oral Daily  . vancomycin  750 mg Intravenous Q24H  . voriconazole  200 mg Oral Q12H    Subjective: He states that he is feeling worse since admission. He states that he is having more pain all over. Review of Systems: Pertinent items are noted in HPI.  Past Medical History  Diagnosis Date  . Peripheral vascular disease     with claudication  . Pancreatitis     chronic  . Hypertension   . Glaucoma   . COPD (chronic obstructive pulmonary disease)   . Ulcer     peptic ulcer disease  . H/O alcohol abuse   . History of tobacco abuse   . PONV (postoperative nausea and vomiting)   . Anemia   . GERD (gastroesophageal reflux disease)     "NOT TO BAD NOW"  . Carpal tunnel syndrome, bilateral   . Colon polyps 12/2011  . Prostate carcinoma   . Lung cancer, upper lobe 2007    History  Substance Use Topics  . Smoking status: Former Smoker -- 0.10 packs/day for 55 years  . Smokeless tobacco: Never Used     Comment:    . Alcohol Use: No     Comment: in the past    Family History  Problem  Relation Age of Onset  . Alcohol abuse Father   . Cancer Brother     Allergies  Allergen Reactions  . Morphine And Related Itching    Head to toe  . Chantix [Varenicline] Other (See Comments)    Dreams, itching, suicidal ideation  . Hydromorphone Hcl Itching    Tolerates Hydrocodone-Acetaminophen  . Ibuprofen Nausea And Vomiting  . Omnipaque [Iohexol]      Code: HIVES, Desc: FACIAL HIVES S/P IV CONTRAST.Marland KitchenMarland KitchenOK W/ 25MG  BENADRYL//A.C., Onset Date: [id number]   . Shellfish Allergy Swelling    Objective: Temp:  [99 F (37.2 C)-103.2 F (39.6 C)] 103 F (39.4 C) (09/08 1630) Pulse Rate:  [95-124] 117 (09/08 1600) Resp:  [14-32] 20 (09/08 1600) BP: (80-138)/(43-77) 129/76 mmHg (09/08 1600) SpO2:  [85 %-100 %] 97 % (09/08 1600) Weight:  [122 lb 9.2 oz (55.6 kg)] 122 lb 9.2 oz (55.6 kg) (09/07 1849)  General: He is alert and eating dinner. He is very frustrated and does not want to answer questions Lungs: Diminished breath sounds with some crackles on  the left posteriorly. He has a basin full copious amounts of green sputum at the bedside Cor: Tachycardic but regular S1 and S2 with no murmurs  Lab Results Lab Results  Component Value Date   WBC 18.3* 10/05/2013   HGB 10.0* 10/05/2013   HCT 28.2* 10/05/2013   MCV 75.2* 10/05/2013   PLT 369 10/05/2013    Lab Results  Component Value Date   CREATININE 1.48* 10/05/2013   BUN 17 10/05/2013   NA 136* 10/05/2013   K 4.1 10/05/2013   CL 97 10/05/2013   CO2 24 10/05/2013    Lab Results  Component Value Date   ALT 8 10/05/2013   AST 19 10/05/2013   ALKPHOS 85 10/05/2013   BILITOT 0.9 10/05/2013      Microbiology: Recent Results (from the past 240 hour(s))  CULTURE, BLOOD (ROUTINE X 2)     Status: None   Collection Time    10/03/13 10:00 PM      Result Value Ref Range Status   Specimen Description BLOOD LEFT ANTECUBITAL   Final   Special Requests BOTTLES DRAWN AEROBIC AND ANAEROBIC 5CC   Final   Culture  Setup Time     Final   Value: 10/04/2013  00:26     Performed at [facility]   Culture     Final   Value:        BLOOD CULTURE RECEIVED NO GROWTH TO DATE CULTURE WILL BE HELD FOR 5 DAYS BEFORE ISSUING A FINAL NEGATIVE REPORT     Performed at [facility]   Report Status PENDING   Incomplete  CULTURE, BLOOD (ROUTINE X 2)     Status: None   Collection Time    10/03/13 10:05 PM      Result Value Ref Range Status   Specimen Description BLOOD LEFT FOREARM   Final   Special Requests BOTTLES DRAWN AEROBIC AND ANAEROBIC 5CC   Final   Culture  Setup Time     Final   Value: 10/04/2013 00:26     Performed at [facility]   Culture     Final   Value:        BLOOD CULTURE RECEIVED NO GROWTH TO DATE CULTURE WILL BE HELD FOR 5 DAYS BEFORE ISSUING A FINAL NEGATIVE REPORT     Performed at [facility]   Report Status PENDING   Incomplete  URINE CULTURE     Status: None   Collection Time    10/04/13 12:28 AM      Result Value Ref Range Status   Specimen Description URINE, RANDOM   Final   Special Requests NONE   Final   Culture  Setup Time     Final   Value: 10/04/2013 11:07     Performed at [place]     Final   Value: NO GROWTH     Performed at [facility]   Culture     Final   Value: NO GROWTH     Performed at [facility]   Report Status 10/05/2013 FINAL   Final  CULTURE, EXPECTORATED SPUTUM-ASSESSMENT     Status: None   Collection Time    10/04/13 12:48 AM      Result Value Ref Range Status   Specimen Description SPUTUM   Final   Special Requests Immunocompromised   Final   Sputum evaluation     Final   Value: MICROSCOPIC FINDINGS SUGGEST THAT THIS  SPECIMEN IS NOT REPRESENTATIVE OF LOWER RESPIRATORY SECRETIONS. PLEASE RECOLLECT. CALLED TO RIMUNDO,J RN AT [phone] BY TIBBITTS,K   Report Status 10/04/2013 FINAL   Final  AFB CULTURE WITH SMEAR     Status: None   Collection Time    10/04/13  2:55 AM      Result Value Ref Range Status   Specimen  Description SPUTUM   Final   Special Requests Normal   Final   Acid Fast Smear     Final   Value: NO ACID FAST BACILLI SEEN     Performed at [facility]   Culture     Final   Value: CULTURE WILL BE EXAMINED FOR 6 WEEKS BEFORE ISSUING A FINAL REPORT     Performed at [facility]   Report Status PENDING   Incomplete  CULTURE, EXPECTORATED SPUTUM-ASSESSMENT     Status: None   Collection Time    10/04/13  2:55 AM      Result Value Ref Range Status   Specimen Description SPUTUM   Final   Special Requests NONE   Final   Sputum evaluation     Final   Value: THIS SPECIMEN IS ACCEPTABLE. RESPIRATORY CULTURE REPORT TO FOLLOW.   Report Status 10/04/2013 FINAL   Final  CULTURE, RESPIRATORY (NON-EXPECTORATED)     Status: None   Collection Time    10/04/13  2:55 AM      Result Value Ref Range Status   Specimen Description SPUTUM   Final   Special Requests NONE   Final   Gram Stain     Final   Value: MODERATE WBC PRESENT,BOTH PMN AND MONONUCLEAR     RARE SQUAMOUS EPITHELIAL CELLS PRESENT     FEW GRAM POSITIVE COCCI     IN PAIRS IN CLUSTERS FEW GRAM NEGATIVE RODS     Performed at [facility]   Culture     Final   Value: NORMAL OROPHARYNGEAL FLORA     Performed at [facility]   Report Status PENDING   Incomplete  FUNGUS CULTURE W SMEAR     Status: None   Collection Time    10/04/13  3:02 AM      Result Value Ref Range Status   Specimen Description SPUTUM   Final   Special Requests Normal   Final   Fungal Smear     Final   Value: NO YEAST OR FUNGAL ELEMENTS SEEN     Performed at [facility]   Culture     Final   Value: CULTURE IN PROGRESS FOR FOUR WEEKS     Performed at [facility]   Report Status PENDING   Incomplete  MRSA PCR SCREENING     Status: None   Collection Time    10/04/13  6:38 PM      Result Value Ref Range Status   MRSA by PCR NEGATIVE  NEGATIVE Final   Comment:            The GeneXpert MRSA Assay (FDA     approved  for NASAL specimens     only), is one component of a     comprehensive MRSA colonization     surveillance program. It is not     intended to diagnose MRSA     infection nor to guide or     monitor treatment for     MRSA infections.    Studies/Results: Ct Chest Wo Contrast  10/04/2013  CLINICAL DATA:  Fever for 2 weeks. Diagnosed with fungal infection of lungs 2 weeks ago. Previous history of lung cancer with right lung resection. Cough. 30 lb weight loss over 1 month.  EXAM: CT CHEST WITHOUT CONTRAST  TECHNIQUE: Multidetector CT imaging of the chest was performed following the standard protocol without IV contrast.  COMPARISON:  12/05/2010  FINDINGS: Postoperative right partial pneumonectomy with surgical clips in the right axilla. Diffuse pulmonary hyperinflation consistent with emphysema. Scarring in the apices. The left lung demonstrates a cavitary lesion with an air-fluid level in the upper lung region, measuring about 5 cm diameter. There is diffuse surrounding airspace and interstitial infiltration throughout the left upper lung, lingula, and superior segment of left lower lung. Left apical gas collection could represent pre-existing emphysematous bulla. No pleural effusion. Changes are consistent with infectious or inflammatory process and could represent necrotizing pneumonia or atypical pneumonia with lung abscess. Airways appear patent. No pneumothorax. Normal heart size. Calcification in the aorta. Esophagus is decompressed. No significant lymphadenopathy is noted in the chest.  Included portions of the upper abdominal organs demonstrate calcifications in the pancreas extensive vascular calcifications in the upper abdomen. Postoperative changes in the cervical spine.  IMPRESSION: Extensive infiltrative/inflammatory process in the left lung with large cavitary process with air-fluid level in the left upper lung. Changes likely to represent pneumonia or atypical pneumonia with cavitation due to  abscess or necrotizing pneumonia. Followup after resolution of acute process is recommended to exclude underlying cavitary mass lesion.   Electronically Signed   By: Lucienne Capers M.D.   On: 10/04/2013 00:33   Dg Chest Port 1 View  (if Code Sepsis Called)  10/03/2013   CLINICAL DATA:  Fever.  COPD.  Right lung carcinoma.  EXAM: PORTABLE CHEST - 1 VIEW  COMPARISON:  09/16/13  FINDINGS: Increased airspace consolidation seen in the left upper lobe. Changes of COPD again demonstrated. No evidence of pneumothorax or pleural effusion. Surgical clips again seen in the right hilum.  IMPRESSION: Increased left upper lobe consolidation.  COPD.   Electronically Signed   By: Earle Gell M.D.   On: 10/03/2013 20:51    Assessment: He has severe left-sided necrotizing pneumonia that seems to have been slowly worsening over the last several months. He had a negative interferon gamma release assay for latent tuberculosis [facility] in July and AFB stains and cultures were negative from his bronchoscopy on August 20. Nonetheless, I will continue airborne precautions for now. Fungal cultures were negative from proximal specimens on August 20 but the transbronchial biopsy showed" abundant, aggregates of fungal hyphae". I agree with his current antibiotic regimen for now.  Plan: 1. Continue vancomycin, piperacillin tazobactam and voriconazole pending further culture results  Michel Bickers, MD [facility] for [place] [phone] pager   [phone] cell 10/05/2013, 5:06 PM

## 2013-10-05 NOTE — Clinical Documentation Improvement (Signed)
  MD's, PA's, and NP's  Patient presented with T 102.5 , P 117,  and WBC 14.2 and  diagnosis of Hospital Associated Pneumonia/ Lung Abscess.  If any of the following clinical conditions  are appropriate for this admission  please document in notes.   Thank you    Possible Clinical Conditions?  Sepsis  SIRS  Other Condition   Cannot clinically Determine   Risk Factors: Lung Cancer, s/p Bronchoscopy, smoker    Thank You, Ree Kida ,RN Clinical Documentation Specialist:  [facility] [employer] [facility]

## 2013-10-06 DIAGNOSIS — A419 Sepsis, unspecified organism: Principal | ICD-10-CM

## 2013-10-06 DIAGNOSIS — J852 Abscess of lung without pneumonia: Secondary | ICD-10-CM

## 2013-10-06 LAB — CULTURE, RESPIRATORY W GRAM STAIN: Culture: NORMAL

## 2013-10-06 LAB — BASIC METABOLIC PANEL
ANION GAP: 12 (ref 5–15)
BUN: 15 mg/dL (ref 6–23)
CALCIUM: 8.6 mg/dL (ref 8.4–10.5)
CHLORIDE: 98 meq/L (ref 96–112)
CO2: 24 mEq/L (ref 19–32)
Creatinine, Ser: 1.45 mg/dL — ABNORMAL HIGH (ref 0.50–1.35)
GFR calc Af Amer: 57 mL/min — ABNORMAL LOW (ref >90)
GFR calc non Af Amer: 49 mL/min — ABNORMAL LOW (ref >90)
Glucose, Bld: 136 mg/dL — ABNORMAL HIGH (ref 70–99)
Potassium: 4.3 mEq/L (ref 3.7–5.3)
SODIUM: 134 meq/L — AB (ref 137–147)

## 2013-10-06 LAB — TYPE AND SCREEN
ABO/RH(D): O POS
Antibody Screen: NEGATIVE
UNIT DIVISION: 0
Unit division: 0

## 2013-10-06 LAB — CBC
HCT: 26.9 % — ABNORMAL LOW (ref 39.0–52.0)
Hemoglobin: 9.4 g/dL — ABNORMAL LOW (ref 13.0–17.0)
MCH: 26.6 pg (ref 26.0–34.0)
MCHC: 34.9 g/dL (ref 30.0–36.0)
MCV: 76 fL — ABNORMAL LOW (ref 78.0–100.0)
PLATELETS: 365 10*3/uL (ref 150–400)
RBC: 3.54 MIL/uL — ABNORMAL LOW (ref 4.22–5.81)
RDW: 15.7 % — ABNORMAL HIGH (ref 11.5–15.5)
WBC: 18.1 10*3/uL — ABNORMAL HIGH (ref 4.0–10.5)

## 2013-10-06 LAB — CULTURE, RESPIRATORY

## 2013-10-06 LAB — IGG, IGA, IGM
IgA: 268 mg/dL (ref 68–379)
IgG (Immunoglobin G), Serum: 1740 mg/dL — ABNORMAL HIGH (ref 650–1600)
IgM, Serum: 103 mg/dL (ref 41–251)

## 2013-10-06 LAB — VANCOMYCIN, TROUGH: VANCOMYCIN TR: 7.6 ug/mL — AB (ref 10.0–20.0)

## 2013-10-06 LAB — PSA: PSA: 5.32 ng/mL — ABNORMAL HIGH (ref <=4.00)

## 2013-10-06 MED ORDER — POLYETHYLENE GLYCOL 3350 17 G PO PACK
17.0000 g | PACK | Freq: Every day | ORAL | Status: DC
  Filled 2013-10-06 (×7): qty 1

## 2013-10-06 MED ORDER — HYDROMORPHONE HCL PF 1 MG/ML IJ SOLN
0.5000 mg | INTRAMUSCULAR | Status: DC | PRN
  Administered 2013-10-06 – 2013-10-12 (×16): 0.5 mg via INTRAVENOUS
  Filled 2013-10-06 (×16): qty 1

## 2013-10-06 MED ORDER — CETYLPYRIDINIUM CHLORIDE 0.05 % MT LIQD
7.0000 mL | Freq: Two times a day (BID) | OROMUCOSAL | Status: DC
  Administered 2013-10-06 – 2013-10-12 (×10): 7 mL via OROMUCOSAL

## 2013-10-06 MED ORDER — VANCOMYCIN HCL IN DEXTROSE 1-5 GM/200ML-% IV SOLN
1000.0000 mg | Freq: Two times a day (BID) | INTRAVENOUS | Status: DC
  Administered 2013-10-06 – 2013-10-08 (×4): 1000 mg via INTRAVENOUS
  Filled 2013-10-06 (×4): qty 200
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
## 2013-10-06 NOTE — Progress Notes (Addendum)
PULMONARY / CRITICAL CARE MEDICINE   Name: [name] [name] MRN: [id number] DOB: 06-02-1945    ADMISSION DATE:  10/03/2013 CONSULTATION DATE:  10/04/2013  REFERRING MD :  [name] [name]  CHIEF COMPLAINT:  Lung abscess  INITIAL PRESENTATION:  [name] [name] is a 68 y/o man with a history of lung cancer s/p resection of RUL who was referred to [facility] pulmonary for a cavitary lung lesion left upper lobe in July 2015. Presented to [facility] ED 9/6 with fever, weight loss and cough since July  Tests/ ID studies 8/20 bronch (out-pt) path: tissue sections demonstrate broad areas of tissue necrosis in which there are aggregates of fungal hyphae. 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 CT - large left upper lobe cavitary lesion  SUBJECTIVE:  He's frustrated about essentially everything. Asking for meds from nursing then refusing.  VITAL SIGNS: Temp:  [98.8 F (37.1 C)-103 F (39.4 C)] 99.2 F (37.3 C) (09/09 0800) Pulse Rate:  [82-126] 88 (09/09 0832) Resp:  [20-28] 24 (09/09 0832) BP: (82-129)/(48-77) 103/57 mmHg (09/09 0832) SpO2:  [85 %-98 %] 93 % (09/09 0832)   Intake/Output Summary (Last 24 hours) at 10/06/13 1035 Last data filed at 10/06/13 0900  Gross per 24 hour  Intake   2550 ml  Output   1150 ml  Net   1400 ml    PHYSICAL EXAMINATION: General:  Thin man, laying in bed, no acute distress.  Neuro:  A & O x3, CNII-XII intact,  HEENT:  PERRL, EOMI, OP clear Cardiovascular:  NRRR, no MRG Lungs:  Diminished breath sounds in LUL, scattered rhonchi improved Abdomen:  Soft, NTND, +BS, no HSM Musculoskeletal:  No  CCE Skin:  No bruising, rashes or other lesions.   LABS: PULMONARY No results found for this basename: PHART, PCO2, PCO2ART, PO2, PO2ART, HCO3, TCO2, O2SAT,  in the last 168 hours  CBC  Recent Labs Lab 10/04/13 1743 10/05/13 1015 10/06/13 0320  HGB 7.3* 10.0* 9.4*  HCT 21.1* 28.2* 26.9*  WBC 13.1* 18.3* 18.1*  PLT 382 369 365    COAGULATION  Recent Labs Lab 10/05/13 1015  INR 1.30    CARDIAC  No results found for this basename: TROPONINI,  in the last 168 hours No results found for this basename: PROBNP,  in the last 168 hours   CHEMISTRY  Recent Labs Lab 10/03/13 2026 10/04/13 0602 10/05/13 1015 10/06/13 0320  NA 131* 133* 136* 134*  K 4.1 4.2 4.1 4.3  CL 92* 95* 97 98  CO2 22 23 24 24   GLUCOSE 126* 126* 116* 136*  BUN 21 21 17 15   CREATININE 1.52* 1.53* 1.48* 1.45*  CALCIUM 9.1 8.7 9.2 8.6   Estimated Creatinine Clearance: 39.9 ml/min (by C-G formula based on Cr of 1.45).   LIVER  Recent Labs Lab 10/03/13 2026 10/04/13 0602 10/05/13 1015  AST 11 13 19   ALT 7 7 8   ALKPHOS 96 81 85  BILITOT 0.5 2.0* 0.9  PROT 8.7* 7.5 7.4  ALBUMIN 2.8* 2.4* 2.3*  INR  --   --  1.30    IMAGING x48h No results found.    ASSESSMENT / PLAN:  LUL lung abscess/bullitis Necrotizing PNA  Has undergone bronchoscopy and transbronchial biopsies however etiology is still unclear. The features of this despite bronch showing fungal elements (the chronicity, cachexia, weight loss and CT findings and the ongoing copiopus sputum ) are all c.w lung abscess / necrotizing penumonia from anaerobes. Aspergillus a possible pathogen. Doubt Tb ANC and ANCA studies negative   10/06/13: Staff MD note: he is coughing tons of purulent sputum and miserable. In addition, he is now getting very frustrated by his illness and feels he cannot handle weeks of antibiotics. Size of 5cm with such severe purulence meets indication for resection - something he is open to considering. Beyond  current cachexia, age, and anemia and mild renal insuff I do not see major impediments to lobectomy. Possibly moderate risk for pulmonary complications. Will ask CVTS for input   Plan - Called CVTS for consultation for LUL lobectormy -cont voriconazole and f/u aspergillus antigen and antibody for further evaluation. Cont broad spectrum abx pending results of bacterial sputum culture (already pending).   -Vanc/zosyn 9/6>>> -will need minimum 6-8 weeks of IV abx if this is indeed abscess.  -f/u sputum for AFB and Quantiferon Gold to r/o TB   Acute anemia . Being followed by heme/onc. Felt to be multifactorial: feso4 def, anemia of renal failure, and hemolysis. Heme/onc does not think med related. To date no blood loss noted from GI source Plan Per heme/onc and med service -  PCCM recommends strongly prbc only if hgb < 7gm% per ICU guidelines and 1 unit at a time only   STAFF MD and NP Note Concerned about compliance after d/c. A very likely scenario is that he will be discharged w/ plan for IV antibiotics for anywhere from 6-8 weeks or more if this is indeed abscess. He can't seem to understand why this is an out-pt treatment plan and insists he needs to stay in the hospital until it's completed. We will cont to await all fungal culture and antigen data. He meets surgical resection indication based on size, failure to respond Rx and severe symptoms.  Because   AFB negative smear surgical consultation might be best. At minimum might assist w/ medical compliance if he is deemed not a candidate from the surgical team as well.   Dr. Brand [name], M.D., [facility].C.P Pulmonary and Critical Care Medicine Staff Physician [name] Pulmonary and Critical Care Pager: [phone], If no answer or between  15:00h - 7:00h: call [phone]  10/06/2013 11:33 AM

## 2013-10-06 NOTE — Progress Notes (Signed)
Patient ID: [name] [name], male   DOB: 11-25-1945, 68 y.o.   MRN: [id number]         [facility]    Date of Admission:  10/03/2013           Day 4 vancomycin        Day 4 piperacillin tazobactam        Day 4 voriconazole Active Problems:   Hypertension   Peripheral vascular disease   Glaucoma   Smoker   Necrotizing pneumonia   Hospital acquired PNA   Protein-calorie malnutrition, severe   Microcytic anemia   CKD (chronic kidney disease) stage 3, GFR 30-59 ml/min   History of lung abscess   Acute hemolytic anemia   Sepsis   . sodium chloride   Intravenous Once  . amitriptyline  50 mg Oral Daily  . antiseptic oral rinse  7 mL Mouth Rinse BID  . feeding supplement (ENSURE COMPLETE)  237 mL Oral TID BM  . fluticasone  2 spray Each Nare Daily  . latanoprost  1 drop Both Eyes QHS  . nicotine  21 mg Transdermal Daily  . piperacillin-tazobactam (ZOSYN)  IV  3.375 g Intravenous Q8H  . polyethylene glycol  17 g Oral Daily  . valACYclovir  1,000 mg Oral Daily  . vancomycin  750 mg Intravenous Q24H  . voriconazole  200 mg Oral Q12H    Subjective: He states that he is no better.  Objective: Temp:  [98.8 F (37.1 C)-103 F (39.4 C)] 99.3 F (37.4 C) (09/09 1200) Pulse Rate:  [82-126] 83 (09/09 1500) Resp:  [20-28] 22 (09/09 1500) BP: (82-129)/(48-79) 117/62 mmHg (09/09 1500) SpO2:  [89 %-98 %] 93 % (09/09 1500)  General: He remains frustrated Lungs: Diminished breath sounds with some crackles on the left. He is still bringing up copious amounts of green sputum Cor: Tachycardic but regular S1 and S2 with no murmurs  Lab Results Lab Results  Component Value Date   WBC 18.1* 10/06/2013   HGB 9.4* 10/06/2013   HCT 26.9* 10/06/2013   MCV 76.0* 10/06/2013   PLT 365 10/06/2013    Lab Results  Component Value Date   CREATININE 1.45* 10/06/2013   BUN 15 10/06/2013   NA 134* 10/06/2013   K 4.3 10/06/2013   CL 98 10/06/2013   CO2 24 10/06/2013    Lab Results    Component Value Date   ALT 8 10/05/2013   AST 19 10/05/2013   ALKPHOS 85 10/05/2013   BILITOT 0.9 10/05/2013      Microbiology: Recent Results (from the past 240 hour(s))  CULTURE, BLOOD (ROUTINE X 2)     Status: None   Collection Time    10/03/13 10:00 PM      Result Value Ref Range Status   Specimen Description BLOOD LEFT ANTECUBITAL   Final   Special Requests BOTTLES DRAWN AEROBIC AND ANAEROBIC 5CC   Final   Culture  Setup Time     Final   Value: 10/04/2013 00:26     Performed at [facility]   Culture     Final   Value:        BLOOD CULTURE RECEIVED NO GROWTH TO DATE CULTURE WILL BE HELD FOR 5 DAYS BEFORE ISSUING A FINAL NEGATIVE REPORT     Performed at [facility]   Report Status PENDING   Incomplete  CULTURE, BLOOD (ROUTINE X 2)     Status: None   Collection Time  10/03/13 10:05 PM      Result Value Ref Range Status   Specimen Description BLOOD LEFT FOREARM   Final   Special Requests BOTTLES DRAWN AEROBIC AND ANAEROBIC 5CC   Final   Culture  Setup Time     Final   Value: 10/04/2013 00:26     Performed at [facility]   Culture     Final   Value:        BLOOD CULTURE RECEIVED NO GROWTH TO DATE CULTURE WILL BE HELD FOR 5 DAYS BEFORE ISSUING A FINAL NEGATIVE REPORT     Performed at [facility]   Report Status PENDING   Incomplete  URINE CULTURE     Status: None   Collection Time    10/04/13 12:28 AM      Result Value Ref Range Status   Specimen Description URINE, RANDOM   Final   Special Requests NONE   Final   Culture  Setup Time     Final   Value: 10/04/2013 11:07     Performed at [place]     Final   Value: NO GROWTH     Performed at [facility]   Culture     Final   Value: NO GROWTH     Performed at [facility]   Report Status 10/05/2013 FINAL   Final  CULTURE, EXPECTORATED SPUTUM-ASSESSMENT     Status: None   Collection Time    10/04/13 12:48 AM      Result Value Ref Range Status    Specimen Description SPUTUM   Final   Special Requests Immunocompromised   Final   Sputum evaluation     Final   Value: MICROSCOPIC FINDINGS SUGGEST THAT THIS SPECIMEN IS NOT REPRESENTATIVE OF LOWER RESPIRATORY SECRETIONS. PLEASE RECOLLECT. CALLED TO RIMUNDO,J RN AT [phone] 09.07.15 BY TIBBITTS,K   Report Status 10/04/2013 FINAL   Final  AFB CULTURE WITH SMEAR     Status: None   Collection Time    10/04/13  2:55 AM      Result Value Ref Range Status   Specimen Description SPUTUM   Final   Special Requests Normal   Final   Acid Fast Smear     Final   Value: NO ACID FAST BACILLI SEEN     Performed at [facility]   Culture     Final   Value: CULTURE WILL BE EXAMINED FOR 6 WEEKS BEFORE ISSUING A FINAL REPORT     Performed at [facility]   Report Status PENDING   Incomplete  CULTURE, EXPECTORATED SPUTUM-ASSESSMENT     Status: None   Collection Time    10/04/13  2:55 AM      Result Value Ref Range Status   Specimen Description SPUTUM   Final   Special Requests NONE   Final   Sputum evaluation     Final   Value: THIS SPECIMEN IS ACCEPTABLE. RESPIRATORY CULTURE REPORT TO FOLLOW.   Report Status 10/04/2013 FINAL   Final  CULTURE, RESPIRATORY (NON-EXPECTORATED)     Status: None   Collection Time    10/04/13  2:55 AM      Result Value Ref Range Status   Specimen Description SPUTUM   Final   Special Requests NONE   Final   Gram Stain     Final   Value: MODERATE WBC PRESENT,BOTH PMN AND MONONUCLEAR     RARE SQUAMOUS EPITHELIAL CELLS PRESENT  FEW GRAM POSITIVE COCCI     IN PAIRS IN CLUSTERS FEW GRAM NEGATIVE RODS     Performed at [facility]   Culture     Final   Value: NORMAL OROPHARYNGEAL FLORA     Performed at [facility]   Report Status 10/06/2013 FINAL   Final  FUNGUS CULTURE W SMEAR     Status: None   Collection Time    10/04/13  3:02 AM      Result Value Ref Range Status   Specimen Description SPUTUM   Final   Special Requests Normal    Final   Fungal Smear     Final   Value: NO YEAST OR FUNGAL ELEMENTS SEEN     Performed at [facility]   Culture     Final   Value: CULTURE IN PROGRESS FOR FOUR WEEKS     Performed at [facility]   Report Status PENDING   Incomplete  MRSA PCR SCREENING     Status: None   Collection Time    10/04/13  6:38 PM      Result Value Ref Range Status   MRSA by PCR NEGATIVE  NEGATIVE Final   Comment:            The GeneXpert MRSA Assay (FDA     approved for NASAL specimens     only), is one component of a     comprehensive MRSA colonization     surveillance program. It is not     intended to diagnose MRSA     infection nor to guide or     monitor treatment for     MRSA infections.  AFB CULTURE WITH SMEAR     Status: None   Collection Time    10/05/13  5:53 AM      Result Value Ref Range Status   Specimen Description SPUTUM   Final   Special Requests Normal   Final   Acid Fast Smear     Final   Value: NO ACID FAST BACILLI SEEN     Performed at [facility]   Culture     Final   Value: CULTURE WILL BE EXAMINED FOR 6 WEEKS BEFORE ISSUING A FINAL REPORT     Performed at [facility]   Report Status PENDING   Incomplete    Assessment: He has severe left-sided necrotizing pneumonia that seems to have been slowly worsening over the last several months. He had a negative interferon gamma release assay for latent tuberculosis at the health department in July and AFB, fungal and routine stains and cultures are negative from his bronchoscopies on August 20 and September 7. 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 The most relevant clue to the etiology seems to be the 8/20 biopsy report showing "abundant aggregates of fungal hyphae". I agree with Thoracic Surgery evaluation for possible lung resection.  Plan: 1. Continue vancomycin, piperacillin  tazobactam and voriconazole pending further culture results  Michel Bickers, MD [facility] [phone] pager   [phone] cell 10/06/2013, 3:34 PM

## 2013-10-06 NOTE — Progress Notes (Signed)
[facility] PROGRESS NOTE  Lydell Moga ION:[id number] DOB: 1945/12/21 DOA: 10/03/2013 PCP: Garnet Koyanagi, DO   Brief narrative 68 year old male with history of COPD with ongoing tobacco use, hypertension, peripheral vascular disease with 3 months history of fever and cough despite multiple course of antibiotics. Patient seen by pulmonary as outpatient and had bronchoscopy done in August 2015 which showed large amount of necrotic tissue with negative cultures. Patient presented to the ED 09/6 with complaints of fever with cough and weight loss. A CT scan of the chest without contrast showed extensive infiltrative/inflammatory process in the left lung with large cavitary process with air fluid level in the left upper lung again suggestive of abscess versus necrotizing pneumonia. Patient placed on airborne precautions and sputum for AFB sent. (Previously been negative).  ID and pulmonary have been consulted and patient is on broad-spectrum antibiotic as well as IV voriconazole for suspicion of pulmonary aspergillus. Patient on admission had drop in hemoglobin to 7.7 and also with low normal blood pressure for which he was admitted to step down for closer monitoring.   Assessment/Plan: Sepsis with Necrotizing pneumonia/ ? Lung abscess -Continue empiric IV vancomycin and Zosyn along with IV voriconazole for suspicion of Aspergillus infection.Marland Kitchen Appreciate pulmonary and ID recommendation. Sputum for AFB from 9/7 negative. Aspergillus antibody angina, sent and pending. Fungal antibodies panel pending. Sputum cultures  negative as well. -Quanteferon gold pending. -Follow AFB from 9/8. Maintain airborne precaution -Pulmonary have consulted thoracic surgery to evaluate for left upper lobe lobectomy given the significant size of the cavitation/abscess. Recommend need for minimum 6-8 weeks of IV antibiotic if it appears to be abscess. -Will follow up with ID regarding antibiotic course.  Acute  anemia Suspected to be hemolyzed in the setting of chronic microcytic anemia. He also has underlying chronic kidney disease and alcohol use. Appreciate hematology input. Labs sent for protein electrophoresis, cold agglutinin. Iron panel suggest iron deficiency. Erythropoietin level pending. B12 normal. Check stool for occult blood. Received PRBC and has improved.  Hypertension Holding stations as blood pressure has remained soft.  Leukocytosis Secondary to pneumonia/ abscess. Monitor in a.m.  Chronic kidney disease stage III Appears to be at baseline. Monitor  COPD Counseled strongly on smoking cessation. Nicotine patch.  Severe protein calorie malnutrition Added supplements  Peripheral vascular disease Continue aspirin.  Generalized body ache Will increase her Dilaudid 2.5 mg every 4 hours 2 continue Benadryl. Has severe itching with opiates  Code Status: Full code Family Communication: None bedside Disposition Plan: Currently  inpatient   Consultants:  ID  Pulmonary  Procedures:  None  Antibiotics:  IV vancomycin 9/6--  IV Zosyn 9/7--  IV voriconazole 9/7--  Valtrex  9/7--  HPI/Subjective: Patient seen and examined this morning. Complains of generalized body ache. Complains of cough and chest discomfort with it. Has irritable mood. Low grade temp past 24 hrs  Objective: Filed Vitals:   10/06/13 0832  BP: 103/57  Pulse: 88  Temp:   Resp: 24    Intake/Output Summary (Last 24 hours) at 10/06/13 1219 Last data filed at 10/06/13 1044  Gross per 24 hour  Intake   2400 ml  Output   1550 ml  Net    850 ml   Filed Weights   10/04/13 0040 10/04/13 1844 10/04/13 1849  Weight: 53 kg (116 lb 13.5 oz) 55.6 kg (122 lb 9.2 oz) 55.6 kg (122 lb 9.2 oz)    Exam:   General:  Elderly thin built male in no acute distress  HEENT: No pallor, no stool the mucosa  Chest: Diminished left lower lung breath sounds , no rhonchi, crackles or wheeze    Cardiovascular: Normal S1 and S2, no murmurs   Abdomen: Soft, nontender, nondistended, bowel sounds present   Musculoskeletal: Warm, no edema  CNS: Alert and oriented   Data Reviewed: Basic Metabolic Panel:  Recent Labs Lab 10/03/13 2026 10/04/13 0602 10/05/13 1015 10/06/13 0320  NA 131* 133* 136* 134*  K 4.1 4.2 4.1 4.3  CL 92* 95* 97 98  CO2 22 23 24 24   GLUCOSE 126* 126* 116* 136*  BUN 21 21 17 15   CREATININE 1.52* 1.53* 1.48* 1.45*  CALCIUM 9.1 8.7 9.2 8.6   Liver Function Tests:  Recent Labs Lab 10/03/13 2026 10/04/13 0602 10/05/13 1015  AST 11 13 19   ALT 7 7 8   ALKPHOS 96 81 85  BILITOT 0.5 2.0* 0.9  PROT 8.7* 7.5 7.4  ALBUMIN 2.8* 2.4* 2.3*   No results found for this basename: LIPASE, AMYLASE,  in the last 168 hours No results found for this basename: AMMONIA,  in the last 168 hours CBC:  Recent Labs Lab 10/03/13 2026 10/04/13 0602 10/04/13 1621 10/04/13 1743 10/05/13 1015 10/06/13 0320  WBC 14.2* 15.9*  --  13.1* 18.3* 18.1*  NEUTROABS 10.3* 12.5*  --  8.7*  --   --   HGB 9.3* 7.7* 6.7* 7.3* 10.0* 9.4*  HCT 26.6* 22.9* 19.4* 21.1* 28.2* 26.9*  MCV 74.1* 74.6*  --  74.6* 75.2* 76.0*  PLT 465* 404*  --  382 369 365   Cardiac Enzymes: No results found for this basename: CKTOTAL, CKMB, CKMBINDEX, TROPONINI,  in the last 168 hours BNP (last 3 results) No results found for this basename: PROBNP,  in the last 8760 hours CBG:  Recent Labs Lab 10/04/13 0111  GLUCAP 125*    Recent Results (from the past 240 hour(s))  CULTURE, BLOOD (ROUTINE X 2)     Status: None   Collection Time    10/03/13 10:00 PM      Result Value Ref Range Status   Specimen Description BLOOD LEFT ANTECUBITAL   Final   Special Requests BOTTLES DRAWN AEROBIC AND ANAEROBIC 5CC   Final   Culture  Setup Time     Final   Value: 10/04/2013 00:26     Performed at [facility]   Culture     Final   Value:        BLOOD CULTURE RECEIVED NO GROWTH TO DATE CULTURE  WILL BE HELD FOR 5 DAYS BEFORE ISSUING A FINAL NEGATIVE REPORT     Performed at [facility]   Report Status PENDING   Incomplete  CULTURE, BLOOD (ROUTINE X 2)     Status: None   Collection Time    10/03/13 10:05 PM      Result Value Ref Range Status   Specimen Description BLOOD LEFT FOREARM   Final   Special Requests BOTTLES DRAWN AEROBIC AND ANAEROBIC 5CC   Final   Culture  Setup Time     Final   Value: 10/04/2013 00:26     Performed at [facility]   Culture     Final   Value:        BLOOD CULTURE RECEIVED NO GROWTH TO DATE CULTURE WILL BE HELD FOR 5 DAYS BEFORE ISSUING A FINAL NEGATIVE REPORT     Performed at [facility]   Report Status PENDING   Incomplete  URINE CULTURE  Status: None   Collection Time    10/04/13 12:28 AM      Result Value Ref Range Status   Specimen Description URINE, RANDOM   Final   Special Requests NONE   Final   Culture  Setup Time     Final   Value: 10/04/2013 11:07     Performed at [place]     Final   Value: NO GROWTH     Performed at [facility]   Culture     Final   Value: NO GROWTH     Performed at [facility]   Report Status 10/05/2013 FINAL   Final  CULTURE, EXPECTORATED SPUTUM-ASSESSMENT     Status: None   Collection Time    10/04/13 12:48 AM      Result Value Ref Range Status   Specimen Description SPUTUM   Final   Special Requests Immunocompromised   Final   Sputum evaluation     Final   Value: MICROSCOPIC FINDINGS SUGGEST THAT THIS SPECIMEN IS NOT REPRESENTATIVE OF LOWER RESPIRATORY SECRETIONS. PLEASE RECOLLECT. CALLED TO RIMUNDO,J RN AT [phone] 09.07.15 BY TIBBITTS,K   Report Status 10/04/2013 FINAL   Final  AFB CULTURE WITH SMEAR     Status: None   Collection Time    10/04/13  2:55 AM      Result Value Ref Range Status   Specimen Description SPUTUM   Final   Special Requests Normal   Final   Acid Fast Smear     Final   Value: NO ACID FAST BACILLI SEEN      Performed at [facility]   Culture     Final   Value: CULTURE WILL BE EXAMINED FOR 6 WEEKS BEFORE ISSUING A FINAL REPORT     Performed at [facility]   Report Status PENDING   Incomplete  CULTURE, EXPECTORATED SPUTUM-ASSESSMENT     Status: None   Collection Time    10/04/13  2:55 AM      Result Value Ref Range Status   Specimen Description SPUTUM   Final   Special Requests NONE   Final   Sputum evaluation     Final   Value: THIS SPECIMEN IS ACCEPTABLE. RESPIRATORY CULTURE REPORT TO FOLLOW.   Report Status 10/04/2013 FINAL   Final  CULTURE, RESPIRATORY (NON-EXPECTORATED)     Status: None   Collection Time    10/04/13  2:55 AM      Result Value Ref Range Status   Specimen Description SPUTUM   Final   Special Requests NONE   Final   Gram Stain     Final   Value: MODERATE WBC PRESENT,BOTH PMN AND MONONUCLEAR     RARE SQUAMOUS EPITHELIAL CELLS PRESENT     FEW GRAM POSITIVE COCCI     IN PAIRS IN CLUSTERS FEW GRAM NEGATIVE RODS     Performed at [facility]   Culture     Final   Value: NORMAL OROPHARYNGEAL FLORA     Performed at [facility]   Report Status 10/06/2013 FINAL   Final  FUNGUS CULTURE W SMEAR     Status: None   Collection Time    10/04/13  3:02 AM      Result Value Ref Range Status   Specimen Description SPUTUM   Final   Special Requests Normal   Final   Fungal Smear     Final   Value: NO YEAST OR  FUNGAL ELEMENTS SEEN     Performed at [facility]   Culture     Final   Value: CULTURE IN PROGRESS FOR FOUR WEEKS     Performed at [facility]   Report Status PENDING   Incomplete  MRSA PCR SCREENING     Status: None   Collection Time    10/04/13  6:38 PM      Result Value Ref Range Status   MRSA by PCR NEGATIVE  NEGATIVE Final   Comment:            The GeneXpert MRSA Assay (FDA     approved for NASAL specimens     only), is one component of a     comprehensive MRSA colonization     surveillance program. It is not      intended to diagnose MRSA     infection nor to guide or     monitor treatment for     MRSA infections.     Studies: No results found.  Scheduled Meds: . sodium chloride   Intravenous Once  . amitriptyline  50 mg Oral Daily  . antiseptic oral rinse  7 mL Mouth Rinse BID  . feeding supplement (ENSURE COMPLETE)  237 mL Oral TID BM  . fluticasone  2 spray Each Nare Daily  . latanoprost  1 drop Both Eyes QHS  . nicotine  21 mg Transdermal Daily  . piperacillin-tazobactam (ZOSYN)  IV  3.375 g Intravenous Q8H  . polyethylene glycol  17 g Oral Daily  . valACYclovir  1,000 mg Oral Daily  . vancomycin  750 mg Intravenous Q24H  . voriconazole  200 mg Oral Q12H   Continuous Infusions: . sodium chloride 100 mL/hr at 10/05/13 2253      Time spent: 35 minutes    Roberto Hlavaty, [facility] Pager [phone] If 7PM-7AM, please contact night-coverage at [email or web address], password [facility] 10/06/2013, 12:19 PM  LOS: 3 days

## 2013-10-06 NOTE — Progress Notes (Signed)
ANTIBIOTIC CONSULT NOTE - INITIAL  Pharmacy Consult for Vancomycin; renally adjust abx Indication: pneumonia  Assessment: 68 yo M smoker with history of R upper lobectomy (NSCLC 08).  Now with severe left-sided necrotizing pneumonia that seems to have been slowly worsening over the last several months. Negative interferon gamma release assay for latent TB in July and AFB Cx negative on bronchoscopy in August, but ID has not ruled out TB yet.  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 Pharmacy consulted to dose vancomycin for PNA; Zosyn and Voriconazole per MD  9/6 >>Vanc >> 9/6 >>Cefepime >> x1 ED 9/7>>Zosyn (MD) >> 9/7>>Vfend (MD) >>  PTA Valtrex resumed >>  Tmax 103 F (spiking fevers to 103 since admission 9/6) WBC moderately elevated SCr stable (appears to be baseline); CrCl ~ 40 ml/min  9/7 sputum cx: poor specimen 9/7 sputum cx repeat: normal flora (few GPCs; GNR) 9/6 blood x 2: NGTD 9/7 blood x 2: cancelled 9/7 urine: NGF  9/6 Quant TB gold: IP 9/7 AFB cx with smear #1: pending 9/8 AFB cx with smear #2: sent  9/7 fungus cx with smear: no organisms seen on smear; Cx pending 9/7 aspergillus galactomannan Ag: IP (Received Zosyn prior to lab draw) 9/7 aspergillus Ab: IP 9/7 HIV Ab: nonreactive 9/7 strep ur ag: neg 9/7 legionella ur ag: neg  Goal of Therapy:  Vancomycin trough level 15-20 mcg/ml  Plan:   Continue vancomycin 750mg  IV q 24 hours.  Check vancomycin trough tonight (before 4th dose)  Zosyn, voriconazole dosing is appropriate for renal function; no adjustments needed  Follow up culture results, clinical course  ==  Allergies  Allergen Reactions  . Morphine And Related Itching    Head to toe  . Chantix [Varenicline] Other (See Comments)    Dreams, itching, suicidal ideation  . Hydromorphone Hcl Itching    Tolerates Hydrocodone-Acetaminophen  . Ibuprofen Nausea And Vomiting  . Omnipaque [Iohexol]      Code: HIVES, Desc: FACIAL HIVES S/P IV  CONTRAST.Marland KitchenMarland KitchenOK W/ 25MG  BENADRYL//A.C., Onset Date: [id number]   . Shellfish Allergy Swelling    Patient Measurements: Height: 5\' 7"  (170.2 cm) Weight: 122 lb 9.2 oz (55.6 kg) IBW/kg (Calculated) : 66.1 Adjusted Body Weight:   Vital Signs: Temp: 98.8 F (37.1 C) (09/09 0400) Temp src: Oral (09/09 0400) Intake/Output from previous day: 09/08 0701 - 09/09 0700 In: 2775 [I.V.:2475; IV Piggyback:300] Out: 9735 [Urine:1550] Intake/Output from this shift:    Labs:  Recent Labs  10/04/13 0602  10/04/13 1743 10/05/13 1015 10/06/13 0320  WBC 15.9*  --  13.1* 18.3* 18.1*  HGB 7.7*  < > 7.3* 10.0* 9.4*  PLT 404*  --  382 369 365  CREATININE 1.53*  --   --  1.48* 1.45*  < > = values in this interval not displayed. Estimated Creatinine Clearance: 39.9 ml/min (by C-G formula based on Cr of 1.45). No results found for this basename: VANCOTROUGH, VANCOPEAK, VANCORANDOM, [place], [place], GENTRANDOM, [place], TOBRAPEAK, TOBRARND, AMIKACINPEAK, AMIKACINTROU, AMIKACIN,  in the last 72 hours   Microbiology: Recent Results (from the past 720 hour(s))  AFB CULTURE WITH SMEAR     Status: None   Collection Time    09/16/13  8:23 AM      Result Value Ref Range Status   Specimen Description BRONCHIAL ALVEOLAR LAVAGE   Final   Special Requests Normal   Final   Acid Fast Smear     Final   Value: NO ACID FAST BACILLI SEEN  Performed at [facility]     Final   Value: CULTURE WILL BE EXAMINED FOR 6 WEEKS BEFORE ISSUING A FINAL REPORT     Performed at [facility]   Report Status PENDING   Incomplete  FUNGUS CULTURE W SMEAR     Status: None   Collection Time    09/16/13  8:23 AM      Result Value Ref Range Status   Specimen Description BRONCHIAL ALVEOLAR LAVAGE   Final   Special Requests Normal   Final   Fungal Smear     Final   Value: NO YEAST OR FUNGAL ELEMENTS SEEN     Performed at [facility]   Culture     Final   Value: CULTURE IN  PROGRESS FOR FOUR WEEKS     Performed at [facility]   Report Status PENDING   Incomplete  CULTURE, RESPIRATORY (NON-EXPECTORATED)     Status: None   Collection Time    09/16/13  8:35 AM      Result Value Ref Range Status   Specimen Description BRONCHIAL ALVEOLAR LAVAGE   Final   Special Requests Normal   Final   Gram Stain     Final   Value: ABUNDANT WBC PRESENT, PREDOMINANTLY PMN     NO SQUAMOUS EPITHELIAL CELLS SEEN     RARE GRAM POSITIVE COCCI     IN PAIRS     Performed at [facility]   Culture     Final   Value: Non-Pathogenic Oropharyngeal-type Flora Isolated.     Performed at [facility]   Report Status 09/18/2013 FINAL   Final  CULTURE, BLOOD (ROUTINE X 2)     Status: None   Collection Time    10/03/13 10:00 PM      Result Value Ref Range Status   Specimen Description BLOOD LEFT ANTECUBITAL   Final   Special Requests BOTTLES DRAWN AEROBIC AND ANAEROBIC 5CC   Final   Culture  Setup Time     Final   Value: 10/04/2013 00:26     Performed at [facility]   Culture     Final   Value:        BLOOD CULTURE RECEIVED NO GROWTH TO DATE CULTURE WILL BE HELD FOR 5 DAYS BEFORE ISSUING A FINAL NEGATIVE REPORT     Performed at [facility]   Report Status PENDING   Incomplete  CULTURE, BLOOD (ROUTINE X 2)     Status: None   Collection Time    10/03/13 10:05 PM      Result Value Ref Range Status   Specimen Description BLOOD LEFT FOREARM   Final   Special Requests BOTTLES DRAWN AEROBIC AND ANAEROBIC 5CC   Final   Culture  Setup Time     Final   Value: 10/04/2013 00:26     Performed at [facility]   Culture     Final   Value:        BLOOD CULTURE RECEIVED NO GROWTH TO DATE CULTURE WILL BE HELD FOR 5 DAYS BEFORE ISSUING A FINAL NEGATIVE REPORT     Performed at [facility]   Report Status PENDING   Incomplete  URINE CULTURE     Status: None   Collection Time    10/04/13 12:28 AM      Result Value Ref Range Status    Specimen Description URINE, RANDOM   Final   Special  Requests NONE   Final   Culture  Setup Time     Final   Value: 10/04/2013 11:07     Performed at [place]     Final   Value: NO GROWTH     Performed at [facility]   Culture     Final   Value: NO GROWTH     Performed at [facility]   Report Status 10/05/2013 FINAL   Final  CULTURE, EXPECTORATED SPUTUM-ASSESSMENT     Status: None   Collection Time    10/04/13 12:48 AM      Result Value Ref Range Status   Specimen Description SPUTUM   Final   Special Requests Immunocompromised   Final   Sputum evaluation     Final   Value: MICROSCOPIC FINDINGS SUGGEST THAT THIS SPECIMEN IS NOT REPRESENTATIVE OF LOWER RESPIRATORY SECRETIONS. PLEASE RECOLLECT. CALLED TO RIMUNDO,J RN AT [phone] 09.07.15 BY TIBBITTS,K   Report Status 10/04/2013 FINAL   Final  AFB CULTURE WITH SMEAR     Status: None   Collection Time    10/04/13  2:55 AM      Result Value Ref Range Status   Specimen Description SPUTUM   Final   Special Requests Normal   Final   Acid Fast Smear     Final   Value: NO ACID FAST BACILLI SEEN     Performed at [facility]   Culture     Final   Value: CULTURE WILL BE EXAMINED FOR 6 WEEKS BEFORE ISSUING A FINAL REPORT     Performed at [facility]   Report Status PENDING   Incomplete  CULTURE, EXPECTORATED SPUTUM-ASSESSMENT     Status: None   Collection Time    10/04/13  2:55 AM      Result Value Ref Range Status   Specimen Description SPUTUM   Final   Special Requests NONE   Final   Sputum evaluation     Final   Value: THIS SPECIMEN IS ACCEPTABLE. RESPIRATORY CULTURE REPORT TO FOLLOW.   Report Status 10/04/2013 FINAL   Final  CULTURE, RESPIRATORY (NON-EXPECTORATED)     Status: None   Collection Time    10/04/13  2:55 AM      Result Value Ref Range Status   Specimen Description SPUTUM   Final   Special Requests NONE   Final   Gram Stain     Final   Value: MODERATE WBC  PRESENT,BOTH PMN AND MONONUCLEAR     RARE SQUAMOUS EPITHELIAL CELLS PRESENT     FEW GRAM POSITIVE COCCI     IN PAIRS IN CLUSTERS FEW GRAM NEGATIVE RODS     Performed at [facility]   Culture     Final   Value: NORMAL OROPHARYNGEAL FLORA     Performed at [facility]   Report Status 10/06/2013 FINAL   Final  FUNGUS CULTURE W SMEAR     Status: None   Collection Time    10/04/13  3:02 AM      Result Value Ref Range Status   Specimen Description SPUTUM   Final   Special Requests Normal   Final   Fungal Smear     Final   Value: NO YEAST OR FUNGAL ELEMENTS SEEN     Performed at [facility]   Culture     Final   Value: CULTURE IN PROGRESS FOR FOUR WEEKS     Performed  at [facility]   Report Status PENDING   Incomplete  MRSA PCR SCREENING     Status: None   Collection Time    10/04/13  6:38 PM      Result Value Ref Range Status   MRSA by PCR NEGATIVE  NEGATIVE Final   Comment:            The GeneXpert MRSA Assay (FDA     approved for NASAL specimens     only), is one component of a     comprehensive MRSA colonization     surveillance program. It is not     intended to diagnose MRSA     infection nor to guide or     monitor treatment for     MRSA infections.    Medical History: Past Medical History  Diagnosis Date  . Peripheral vascular disease     with claudication  . Pancreatitis     chronic  . Hypertension   . Glaucoma   . COPD (chronic obstructive pulmonary disease)   . Ulcer     peptic ulcer disease  . H/O alcohol abuse   . History of tobacco abuse   . PONV (postoperative nausea and vomiting)   . Anemia   . GERD (gastroesophageal reflux disease)     "NOT TO BAD NOW"  . Carpal tunnel syndrome, bilateral   . Colon polyps 12/2011  . Prostate carcinoma   . Lung cancer, upper lobe 2007    Medications:  Scheduled:  . sodium chloride   Intravenous Once  . amitriptyline  50 mg Oral Daily  . feeding supplement (ENSURE COMPLETE)   237 mL Oral TID BM  . fluticasone  2 spray Each Nare Daily  . latanoprost  1 drop Both Eyes QHS  . nicotine  21 mg Transdermal Daily  . piperacillin-tazobactam (ZOSYN)  IV  3.375 g Intravenous Q8H  . valACYclovir  1,000 mg Oral Daily  . vancomycin  750 mg Intravenous Q24H  . voriconazole  200 mg Oral Q12H   Infusions:  . sodium chloride 100 mL/hr at 10/05/13 2253   PRN: acetaminophen, albuterol, allopurinol, aspirin, benzonatate, diphenhydrAMINE, HYDROmorphone (DILAUDID) injection, hydrOXYzine, ondansetron  Reuel Boom, PharmD Pager: [phone] 10/06/2013, 8:50 AM

## 2013-10-07 DIAGNOSIS — E43 Unspecified severe protein-calorie malnutrition: Secondary | ICD-10-CM

## 2013-10-07 LAB — BASIC METABOLIC PANEL
ANION GAP: 11 (ref 5–15)
BUN: 11 mg/dL (ref 6–23)
CO2: 25 mEq/L (ref 19–32)
Calcium: 8.9 mg/dL (ref 8.4–10.5)
Chloride: 98 mEq/L (ref 96–112)
Creatinine, Ser: 1.32 mg/dL (ref 0.50–1.35)
GFR calc Af Amer: 64 mL/min — ABNORMAL LOW (ref >90)
GFR, EST NON AFRICAN AMERICAN: 55 mL/min — AB (ref >90)
GLUCOSE: 93 mg/dL (ref 70–99)
POTASSIUM: 4.2 meq/L (ref 3.7–5.3)
SODIUM: 134 meq/L — AB (ref 137–147)

## 2013-10-07 LAB — CBC
HCT: 25.6 % — ABNORMAL LOW (ref 39.0–52.0)
Hemoglobin: 8.9 g/dL — ABNORMAL LOW (ref 13.0–17.0)
MCH: 26.6 pg (ref 26.0–34.0)
MCHC: 34.8 g/dL (ref 30.0–36.0)
MCV: 76.4 fL — ABNORMAL LOW (ref 78.0–100.0)
Platelets: 383 10*3/uL (ref 150–400)
RBC: 3.35 MIL/uL — ABNORMAL LOW (ref 4.22–5.81)
RDW: 16.6 % — ABNORMAL HIGH (ref 11.5–15.5)
WBC: 15.9 10*3/uL — ABNORMAL HIGH (ref 4.0–10.5)

## 2013-10-07 LAB — PROTEIN ELECTROPH W RFLX QUANT IMMUNOGLOBULINS
ALBUMIN ELP: 37 % — AB (ref 55.8–66.1)
Alpha-1-Globulin: 12.4 % — ABNORMAL HIGH (ref 2.9–4.9)
Alpha-2-Globulin: 15.4 % — ABNORMAL HIGH (ref 7.1–11.8)
BETA GLOBULIN: 4.4 % — AB (ref 4.7–7.2)
Beta 2: 6.4 % (ref 3.2–6.5)
Gamma Globulin: 24.4 % — ABNORMAL HIGH (ref 11.1–18.8)
M-Spike, %: NOT DETECTED g/dL
TOTAL PROTEIN ELP: 6.4 g/dL (ref 6.0–8.3)

## 2013-10-07 LAB — ASPERGILLUS GALACTOMANNAN ANTIGEN
ASPERGILLUS GALACTOMANNAN AG: NOT DETECTED
ASPERGILLUS GALACTOMANNAN INDEX: 0.2 (ref <0.50)
Aspergillus galactomannan Ag: NOT DETECTED
Aspergillus galactomannan Index: 0.21 (ref <0.50)

## 2013-10-07 LAB — ERYTHROPOIETIN: ERYTHROPOIETIN: 40 m[IU]/mL — AB (ref 2.6–18.5)

## 2013-10-07 LAB — CERULOPLASMIN: Ceruloplasmin: 44 mg/dL — ABNORMAL HIGH (ref 18–36)

## 2013-10-07 MED ORDER — GUAIFENESIN-CODEINE 100-10 MG/5ML PO SOLN
5.0000 mL | Freq: Four times a day (QID) | ORAL | Status: DC | PRN
  Administered 2013-10-07 – 2013-10-10 (×10): 5 mL via ORAL
  Filled 2013-10-07 (×10): qty 5

## 2013-10-07 NOTE — Progress Notes (Addendum)
PULMONARY / CRITICAL CARE MEDICINE   Name: [name] [name] MRN: [id number] DOB: 22-Dec-1945    ADMISSION DATE:  10/03/2013 CONSULTATION DATE:  10/04/2013  REFERRING MD :  Darnell Level  CHIEF COMPLAINT:  Lung abscess  INITIAL PRESENTATION:  [name] [name] is a 68 y/o man with a history of lung cancer s/p resection of RUL who was referred to [facility] pulmonary for a cavitary lung lesion left upper lobe in July 2015. Presented to [facility] ED 9/6 with fever, weight loss and cough since July  Tests/ ID studies 8/20 bronch (out-pt) path: tissue sections demonstrate broad areas of tissue necrosis in which there are aggregates of fungal hyphae. 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 Deemed not a surgical candidate. Very angry. Stating that he wants to shoot the doctors who should have prevented him from getting this sick.   SUBJECTIVE:  No sig change. Mucous improved.   VITAL SIGNS: Temp:  [98.9 F (37.2 C)-101.5 F (38.6 C)] 99.6 F (37.6 C) (09/10 0800) Pulse Rate:  [83-102] 87 (09/10 0800) Resp:  [19-35] 23 (09/10 0800) BP: (89-135)/(45-78) 135/70 mmHg (09/10 0800) SpO2:  [91 %-97 %] 92 % (09/10 0800)   Intake/Output Summary (Last 24 hours) at 10/07/13 1036 Last data filed at 10/07/13 0900  Gross per 24 hour  Intake   2350 ml  Output   3695 ml  Net  -1345 ml    PHYSICAL EXAMINATION: General:  Thin man, laying in bed, no acute distress.  Neuro:  A & O x3,  HEENT:  PERRL, EOMI, OP clear Cardiovascular:  NRRR, no MRG Lungs:  Diminished breath sounds in LUL, scattered rhonchi improved Abdomen:  Soft, NTND, +BS, no HSM Musculoskeletal:  No CCE Skin:  No bruising,  rashes or other lesions.   LABS: PULMONARY No results found for this basename: PHART, PCO2, PCO2ART, PO2, PO2ART, HCO3, TCO2, O2SAT,  in the last 168 hours  CBC  Recent Labs Lab 10/05/13 1015 10/06/13 0320 10/07/13 0345  HGB 10.0* 9.4* 8.9*  HCT 28.2* 26.9* 25.6*  WBC 18.3* 18.1* 15.9*  PLT 369 365 383    COAGULATION  Recent Labs Lab 10/05/13 1015  INR 1.30    CARDIAC  No results found for this basename: TROPONINI,  in the last 168 hours No results found for this basename: PROBNP,  in the last 168 hours   CHEMISTRY  Recent Labs Lab 10/03/13 2026 10/04/13 0602 10/05/13 1015 10/06/13 0320 10/07/13 0345  NA 131* 133* 136* 134* 134*  K 4.1 4.2 4.1 4.3 4.2  CL 92* 95* 97 98 98  CO2 22 23 24 24 25   GLUCOSE 126* 126* 116* 136* 93  BUN 21 21 17 15 11   CREATININE 1.52* 1.53* 1.48* 1.45* 1.32  CALCIUM 9.1 8.7 9.2 8.6 8.9   Estimated Creatinine Clearance: 43.9 ml/min (by C-G formula based on Cr of 1.32).   LIVER  Recent Labs Lab 10/03/13 2026 10/04/13 0602 10/05/13 1015  AST 11 13 19   ALT 7 7 8   ALKPHOS 96 81 85  BILITOT 0.5 2.0* 0.9  PROT 8.7* 7.5 7.4  ALBUMIN 2.8* 2.4* 2.3*  INR  --   --  1.30    IMAGING x48h No results found.  ASSESSMENT / PLAN:  LUL lung abscess/bullitis and Necrotizing PNA Has undergone bronchoscopy and transbronchial biopsies however etiology is still unclear. The features of this suggest necrotizing pneumonia and lung abscess despite bronch showing fungal elements (the chronicity, cachexia, weight loss and CT findings and the ongoing copiopus sputum ) are all c.w lung abscess / necrotizing penumonia from anaerobes. Aspergillus a possible pathogen. Doubt Tb ANC and ANCA studies negative  9/10 STAFF MD note:: deemed not a surgical candidate due to high risk. This is a frustrating situation with high risk of death and worsening cachexia because his inoperable and has essentially liqueyfing lung and can understand his  furstration  Plan -cont voriconazole and f/u aspergillus antigen and antibody for further evaluation. Cont broad spectrum abx pending results of bacterial sputum culture (already pending).   -Vanc/zosyn 9/6>>> -will need minimum 6-8 weeks of IV abx if this is indeed abscess.  -f/u sputum for AFB and Quantiferon Gold to r/o TB   Acute anemia . Being followed by heme/onc. Felt to be multifactorial: feso4 def, anemia of renal failure, and hemolysis. Heme/onc does not think med related. To date no blood loss noted from GI source Plan Per heme/onc and med service -  PCCM recommends strongly prbc only if hgb < 7gm% per ICU guidelines and 1 unit at a time only    We will s/o. Defer to ID further recs re: ABX . REcommend goals of care   Marni Griffon ACNP 10/07/2013 10:36 AM   STAFF MD  Dr. Brand Males, M.D., [facility].C.P Pulmonary and Critical Care Medicine Staff Physician [facility] Pager: [phone], If no answer or between  15:00h - 7:00h: call [phone]  10/07/2013 11:14 AM

## 2013-10-07 NOTE — Progress Notes (Signed)
Patient ID: [name] [name], male   DOB: 03-24-1945, 68 y.o.   MRN: [id number]         [place] [facility]    Date of Admission:  10/03/2013           Day 5 vancomycin        Day 5 piperacillin tazobactam        Day 5 voriconazole Active Problems:   Hypertension   Peripheral vascular disease   Glaucoma   Smoker   Necrotizing pneumonia   Hospital acquired PNA   Protein-calorie malnutrition, severe   Microcytic anemia   CKD (chronic kidney disease) stage 3, GFR 30-59 ml/min   History of lung abscess   Acute hemolytic anemia   Sepsis   . sodium chloride   Intravenous Once  . amitriptyline  50 mg Oral Daily  . antiseptic oral rinse  7 mL Mouth Rinse BID  . feeding supplement (ENSURE COMPLETE)  237 mL Oral TID BM  . fluticasone  2 spray Each Nare Daily  . latanoprost  1 drop Both Eyes QHS  . nicotine  21 mg Transdermal Daily  . piperacillin-tazobactam (ZOSYN)  IV  3.375 g Intravenous Q8H  . polyethylene glycol  17 g Oral Daily  . valACYclovir  1,000 mg Oral Daily  . vancomycin  1,000 mg Intravenous BID  . voriconazole  200 mg Oral Q12H    Subjective: He states that he is beginning to feel a little bit better. He remains very frustrated he could not get any help as an outpatient and felt like if he had not been put in the hospital he was going to die.  Objective: Temp:  [98.1 F (36.7 C)-101.5 F (38.6 C)] 98.1 F (36.7 C) (09/10 1200) Pulse Rate:  [83-115] 115 (09/10 1200) Resp:  [19-35] 20 (09/10 1200) BP: (89-135)/(45-78) 128/71 mmHg (09/10 1200) SpO2:  [91 %-96 %] 92 % (09/10 1200)  General: He looks better today and he is certainly more talkative Lungs: Diminished breath sounds with some crackles on the left. His cough is less frequent and the sputum he is bringing up is beginning to become thinner and whiter Cor: Tachycardic but regular S1 and S2 with no murmurs  Lab Results Lab Results  Component Value Date   WBC 15.9* 10/07/2013   HGB 8.9*  10/07/2013   HCT 25.6* 10/07/2013   MCV 76.4* 10/07/2013   PLT 383 10/07/2013    Lab Results  Component Value Date   CREATININE 1.32 10/07/2013   BUN 11 10/07/2013   NA 134* 10/07/2013   K 4.2 10/07/2013   CL 98 10/07/2013   CO2 25 10/07/2013    Lab Results  Component Value Date   ALT 8 10/05/2013   AST 19 10/05/2013   ALKPHOS 85 10/05/2013   BILITOT 0.9 10/05/2013      Microbiology: Recent Results (from the past 240 hour(s))  CULTURE, BLOOD (ROUTINE X 2)     Status: None   Collection Time    10/03/13 10:00 PM      Result Value Ref Range Status   Specimen Description BLOOD LEFT ANTECUBITAL   Final   Special Requests BOTTLES DRAWN AEROBIC AND ANAEROBIC 5CC   Final   Culture  Setup Time     Final   Value: 10/04/2013 00:26     Performed at [facility]   Culture     Final   Value:        BLOOD CULTURE RECEIVED NO GROWTH  TO DATE CULTURE WILL BE HELD FOR 5 DAYS BEFORE ISSUING A FINAL NEGATIVE REPORT     Performed at [facility]   Report Status PENDING   Incomplete  CULTURE, BLOOD (ROUTINE X 2)     Status: None   Collection Time    10/03/13 10:05 PM      Result Value Ref Range Status   Specimen Description BLOOD LEFT FOREARM   Final   Special Requests BOTTLES DRAWN AEROBIC AND ANAEROBIC 5CC   Final   Culture  Setup Time     Final   Value: 10/04/2013 00:26     Performed at [facility]   Culture     Final   Value:        BLOOD CULTURE RECEIVED NO GROWTH TO DATE CULTURE WILL BE HELD FOR 5 DAYS BEFORE ISSUING A FINAL NEGATIVE REPORT     Performed at [facility]   Report Status PENDING   Incomplete  URINE CULTURE     Status: None   Collection Time    10/04/13 12:28 AM      Result Value Ref Range Status   Specimen Description URINE, RANDOM   Final   Special Requests NONE   Final   Culture  Setup Time     Final   Value: 10/04/2013 11:07     Performed at [place]     Final   Value: NO GROWTH     Performed at [facility]   Culture     Final   Value: NO GROWTH     Performed at [facility]   Report Status 10/05/2013 FINAL   Final  CULTURE, EXPECTORATED SPUTUM-ASSESSMENT     Status: None   Collection Time    10/04/13 12:48 AM      Result Value Ref Range Status   Specimen Description SPUTUM   Final   Special Requests Immunocompromised   Final   Sputum evaluation     Final   Value: MICROSCOPIC FINDINGS SUGGEST THAT THIS SPECIMEN IS NOT REPRESENTATIVE OF LOWER RESPIRATORY SECRETIONS. PLEASE RECOLLECT. CALLED TO RIMUNDO,J RN AT [phone] 09.07.15 BY TIBBITTS,K   Report Status 10/04/2013 FINAL   Final  AFB CULTURE WITH SMEAR     Status: None   Collection Time    10/04/13  2:55 AM      Result Value Ref Range Status   Specimen Description SPUTUM   Final   Special Requests Normal   Final   Acid Fast Smear     Final   Value: NO ACID FAST BACILLI SEEN     Performed at [facility]   Culture     Final   Value: CULTURE WILL BE EXAMINED FOR 6 WEEKS BEFORE ISSUING A FINAL REPORT     Performed at [facility]   Report Status PENDING   Incomplete  CULTURE, EXPECTORATED SPUTUM-ASSESSMENT     Status: None   Collection Time    10/04/13  2:55 AM      Result Value Ref Range Status   Specimen Description SPUTUM   Final   Special Requests NONE   Final   Sputum evaluation     Final   Value: THIS SPECIMEN IS ACCEPTABLE. RESPIRATORY CULTURE REPORT TO FOLLOW.   Report Status 10/04/2013 FINAL   Final  CULTURE, RESPIRATORY (NON-EXPECTORATED)     Status: None   Collection Time    10/04/13  2:55 AM  Result Value Ref Range Status   Specimen Description SPUTUM   Final   Special Requests NONE   Final   Gram Stain     Final   Value: MODERATE WBC PRESENT,BOTH PMN AND MONONUCLEAR     RARE SQUAMOUS EPITHELIAL CELLS PRESENT     FEW GRAM POSITIVE COCCI     IN PAIRS IN CLUSTERS FEW GRAM NEGATIVE RODS     Performed at [facility]   Culture     Final   Value: NORMAL OROPHARYNGEAL FLORA      Performed at [facility]   Report Status 10/06/2013 FINAL   Final  FUNGUS CULTURE W SMEAR     Status: None   Collection Time    10/04/13  3:02 AM      Result Value Ref Range Status   Specimen Description SPUTUM   Final   Special Requests Normal   Final   Fungal Smear     Final   Value: NO YEAST OR FUNGAL ELEMENTS SEEN     Performed at [facility]   Culture     Final   Value: CULTURE IN PROGRESS FOR FOUR WEEKS     Performed at [facility]   Report Status PENDING   Incomplete  MRSA PCR SCREENING     Status: None   Collection Time    10/04/13  6:38 PM      Result Value Ref Range Status   MRSA by PCR NEGATIVE  NEGATIVE Final   Comment:            The GeneXpert MRSA Assay (FDA     approved for NASAL specimens     only), is one component of a     comprehensive MRSA colonization     surveillance program. It is not     intended to diagnose MRSA     infection nor to guide or     monitor treatment for     MRSA infections.  AFB CULTURE WITH SMEAR     Status: None   Collection Time    10/05/13  5:53 AM      Result Value Ref Range Status   Specimen Description SPUTUM   Final   Special Requests Normal   Final   Acid Fast Smear     Final   Value: NO ACID FAST BACILLI SEEN     Performed at [facility]   Culture     Final   Value: CULTURE WILL BE EXAMINED FOR 6 WEEKS BEFORE ISSUING A FINAL REPORT     Performed at [facility]   Report Status PENDING   Incomplete  AFB CULTURE WITH SMEAR     Status: None   Collection Time    10/06/13  5:00 AM      Result Value Ref Range Status   Specimen Description LUNG   Final   Special Requests Normal   Final   Acid Fast Smear     Final   Value: NO ACID FAST BACILLI SEEN     Performed at [facility]   Culture     Final   Value: CULTURE WILL BE EXAMINED FOR 6 WEEKS BEFORE ISSUING A FINAL REPORT     Performed at [facility]   Report Status PENDING   Incomplete    Assessment: He  has severe left-sided necrotizing pneumonia but appears he is beginning to respond to his current 3 drug antimicrobial regimen. This is unlikely  to be tuberculosis and I agree with discontinuing airborne precautions.  Plan: 1. Continue vancomycin, piperacillin tazobactam and voriconazole   Michel Bickers, MD [facility] for Infectious [place] [phone] pager   [phone] cell 10/07/2013, 4:55 PM

## 2013-10-07 NOTE — Progress Notes (Signed)
ANTIBIOTIC CONSULT NOTE - FOLLOW UP  Pharmacy Consult for Vancomycin Indication: Left upper lobe lung abscess  Allergies  Allergen Reactions  . Morphine And Related Itching    Head to toe  . Chantix [Varenicline] Other (See Comments)    Dreams, itching, suicidal ideation  . Hydromorphone Hcl Itching    Tolerates Hydrocodone-Acetaminophen  . Ibuprofen Nausea And Vomiting  . Omnipaque [Iohexol]      Code: HIVES, Desc: FACIAL HIVES S/P IV CONTRAST.Marland KitchenMarland KitchenOK W/ 25MG  BENADRYL//A.C., Onset Date: [id number]   . Shellfish Allergy Swelling    Patient Measurements: Height: 5\' 7"  (170.2 cm) Weight: 122 lb 9.2 oz (55.6 kg) IBW/kg (Calculated) : 66.1 Adjusted Body Weight:   Vital Signs: Temp: 98.9 F (37.2 C) (09/10 0000) Temp src: Oral (09/10 0000) BP: 129/78 mmHg (09/09 2300) Pulse Rate: 93 (09/09 2300) Intake/Output from previous day: 09/09 0701 - 09/10 0700 In: 2050 [I.V.:1700; IV Piggyback:350] Out: 3095 [Urine:3095] Intake/Output from this shift: Total I/O In: 850 [I.V.:600; IV Piggyback:250] Out: 1220 [Urine:1220]  Labs:  Recent Labs  10/05/13 1015 10/06/13 0320 10/07/13 0345  WBC 18.3* 18.1* 15.9*  HGB 10.0* 9.4* 8.9*  PLT 369 365 383  CREATININE 1.48* 1.45* 1.32   Estimated Creatinine Clearance: 43.9 ml/min (by C-G formula based on Cr of 1.32).  Recent Labs  10/06/13 2047  [place] 7.6*     Microbiology: Recent Results (from the past 720 hour(s))  AFB CULTURE WITH SMEAR     Status: None   Collection Time    09/16/13  8:23 AM      Result Value Ref Range Status   Specimen Description BRONCHIAL ALVEOLAR LAVAGE   Final   Special Requests Normal   Final   Acid Fast Smear     Final   Value: NO ACID FAST BACILLI SEEN     Performed at [facility]   Culture     Final   Value: CULTURE WILL BE EXAMINED FOR 6 WEEKS BEFORE ISSUING A FINAL REPORT     Performed at [facility]   Report Status PENDING   Incomplete  FUNGUS CULTURE W SMEAR      Status: None   Collection Time    09/16/13  8:23 AM      Result Value Ref Range Status   Specimen Description BRONCHIAL ALVEOLAR LAVAGE   Final   Special Requests Normal   Final   Fungal Smear     Final   Value: NO YEAST OR FUNGAL ELEMENTS SEEN     Performed at [facility]   Culture     Final   Value: CULTURE IN PROGRESS FOR FOUR WEEKS     Performed at [facility]   Report Status PENDING   Incomplete  CULTURE, RESPIRATORY (NON-EXPECTORATED)     Status: None   Collection Time    09/16/13  8:35 AM      Result Value Ref Range Status   Specimen Description BRONCHIAL ALVEOLAR LAVAGE   Final   Special Requests Normal   Final   Gram Stain     Final   Value: ABUNDANT WBC PRESENT, PREDOMINANTLY PMN     NO SQUAMOUS EPITHELIAL CELLS SEEN     RARE GRAM POSITIVE COCCI     IN PAIRS     Performed at [facility]   Culture     Final   Value: Non-Pathogenic Oropharyngeal-type Flora Isolated.     Performed at [facility]   Report Status 09/18/2013 FINAL  Final  CULTURE, BLOOD (ROUTINE X 2)     Status: None   Collection Time    10/03/13 10:00 PM      Result Value Ref Range Status   Specimen Description BLOOD LEFT ANTECUBITAL   Final   Special Requests BOTTLES DRAWN AEROBIC AND ANAEROBIC 5CC   Final   Culture  Setup Time     Final   Value: 10/04/2013 00:26     Performed at [facility]   Culture     Final   Value:        BLOOD CULTURE RECEIVED NO GROWTH TO DATE CULTURE WILL BE HELD FOR 5 DAYS BEFORE ISSUING A FINAL NEGATIVE REPORT     Performed at [facility]   Report Status PENDING   Incomplete  CULTURE, BLOOD (ROUTINE X 2)     Status: None   Collection Time    10/03/13 10:05 PM      Result Value Ref Range Status   Specimen Description BLOOD LEFT FOREARM   Final   Special Requests BOTTLES DRAWN AEROBIC AND ANAEROBIC 5CC   Final   Culture  Setup Time     Final   Value: 10/04/2013 00:26     Performed at [facility]   Culture      Final   Value:        BLOOD CULTURE RECEIVED NO GROWTH TO DATE CULTURE WILL BE HELD FOR 5 DAYS BEFORE ISSUING A FINAL NEGATIVE REPORT     Performed at [facility]   Report Status PENDING   Incomplete  URINE CULTURE     Status: None   Collection Time    10/04/13 12:28 AM      Result Value Ref Range Status   Specimen Description URINE, RANDOM   Final   Special Requests NONE   Final   Culture  Setup Time     Final   Value: 10/04/2013 11:07     Performed at [place]     Final   Value: NO GROWTH     Performed at [facility]   Culture     Final   Value: NO GROWTH     Performed at [facility]   Report Status 10/05/2013 FINAL   Final  CULTURE, EXPECTORATED SPUTUM-ASSESSMENT     Status: None   Collection Time    10/04/13 12:48 AM      Result Value Ref Range Status   Specimen Description SPUTUM   Final   Special Requests Immunocompromised   Final   Sputum evaluation     Final   Value: MICROSCOPIC FINDINGS SUGGEST THAT THIS SPECIMEN IS NOT REPRESENTATIVE OF LOWER RESPIRATORY SECRETIONS. PLEASE RECOLLECT. CALLED TO RIMUNDO,J RN AT [phone] BY TIBBITTS,K   Report Status 10/04/2013 FINAL   Final  AFB CULTURE WITH SMEAR     Status: None   Collection Time    10/04/13  2:55 AM      Result Value Ref Range Status   Specimen Description SPUTUM   Final   Special Requests Normal   Final   Acid Fast Smear     Final   Value: NO ACID FAST BACILLI SEEN     Performed at [facility]   Culture     Final   Value: CULTURE WILL BE EXAMINED FOR 6 WEEKS BEFORE ISSUING A FINAL REPORT     Performed at [facility]   Report Status PENDING  Incomplete  CULTURE, EXPECTORATED SPUTUM-ASSESSMENT     Status: None   Collection Time    10/04/13  2:55 AM      Result Value Ref Range Status   Specimen Description SPUTUM   Final   Special Requests NONE   Final   Sputum evaluation     Final   Value: THIS SPECIMEN IS ACCEPTABLE.  RESPIRATORY CULTURE REPORT TO FOLLOW.   Report Status 10/04/2013 FINAL   Final  CULTURE, RESPIRATORY (NON-EXPECTORATED)     Status: None   Collection Time    10/04/13  2:55 AM      Result Value Ref Range Status   Specimen Description SPUTUM   Final   Special Requests NONE   Final   Gram Stain     Final   Value: MODERATE WBC PRESENT,BOTH PMN AND MONONUCLEAR     RARE SQUAMOUS EPITHELIAL CELLS PRESENT     FEW GRAM POSITIVE COCCI     IN PAIRS IN CLUSTERS FEW GRAM NEGATIVE RODS     Performed at [facility]   Culture     Final   Value: NORMAL OROPHARYNGEAL FLORA     Performed at [facility]   Report Status 10/06/2013 FINAL   Final  FUNGUS CULTURE W SMEAR     Status: None   Collection Time    10/04/13  3:02 AM      Result Value Ref Range Status   Specimen Description SPUTUM   Final   Special Requests Normal   Final   Fungal Smear     Final   Value: NO YEAST OR FUNGAL ELEMENTS SEEN     Performed at [facility]   Culture     Final   Value: CULTURE IN PROGRESS FOR FOUR WEEKS     Performed at [facility]   Report Status PENDING   Incomplete  MRSA PCR SCREENING     Status: None   Collection Time    10/04/13  6:38 PM      Result Value Ref Range Status   MRSA by PCR NEGATIVE  NEGATIVE Final   Comment:            The GeneXpert MRSA Assay (FDA     approved for NASAL specimens     only), is one component of a     comprehensive MRSA colonization     surveillance program. It is not     intended to diagnose MRSA     infection nor to guide or     monitor treatment for     MRSA infections.  AFB CULTURE WITH SMEAR     Status: None   Collection Time    10/05/13  5:53 AM      Result Value Ref Range Status   Specimen Description SPUTUM   Final   Special Requests Normal   Final   Acid Fast Smear     Final   Value: NO ACID FAST BACILLI SEEN     Performed at [facility]   Culture     Final   Value: CULTURE WILL BE EXAMINED FOR 6 WEEKS BEFORE  ISSUING A FINAL REPORT     Performed at [facility]   Report Status PENDING   Incomplete    Anti-infectives   Start     Dose/Rate Route Frequency Ordered Stop   10/06/13 2245  vancomycin (VANCOCIN) IVPB 1000 mg/200 mL premix     1,000 mg 200 mL/hr over 60  Minutes Intravenous 2 times daily 10/06/13 2234     10/05/13 1600  voriconazole (VFEND) tablet 200 mg     200 mg Oral Every 12 hours 10/05/13 1538     10/04/13 1000  valACYclovir (VALTREX) tablet 1,000 mg     1,000 mg Oral Daily 10/04/13 0040     10/04/13 0200  piperacillin-tazobactam (ZOSYN) IVPB 3.375 g     3.375 g 12.5 mL/hr over 240 Minutes Intravenous Every 8 hours 10/04/13 0040     10/04/13 0100  voriconazole (VFEND) 330 mg in sodium chloride 0.9 % 100 mL IVPB     6 mg/kg  54.4 kg 66.5 mL/hr over 120 Minutes Intravenous Every 12 hours 10/03/13 2351 10/04/13 1638   10/03/13 2200  ceFEPIme (MAXIPIME) 1 g in dextrose 5 % 50 mL IVPB     1 g 100 mL/hr over 30 Minutes Intravenous  Once 10/03/13 2109 10/03/13 2227   10/03/13 2200  vancomycin (VANCOCIN) IVPB 750 mg/150 ml premix  Status:  Discontinued     750 mg 150 mL/hr over 60 Minutes Intravenous Every 24 hours 10/03/13 2133 10/06/13 2233      Assessment: Patient with low vancomycin levels for Left upper lobe lung abscess.  Desired higher levels due to unlikely surgical candidate status.  Goal of Therapy:  Vancomycin trough level 15-20 mcg/ml  Plan:  Measure antibiotic drug levels at steady state Follow up culture results Change to vancomycin 1gm iv q12hr  Tyler Deis, Shea Stakes [place] 10/07/2013,5:02 AM

## 2013-10-07 NOTE — Progress Notes (Signed)
Clinical Social Work Department BRIEF PSYCHOSOCIAL ASSESSMENT 10/07/2013  Patient:  [name] [name],[name] [name]     Account Number:  [id number]     Admit date:  10/03/2013  Clinical Social Worker:  Ulyess Blossom  Date/Time:  10/07/2013 11:30 AM  Referred by:  Physician  Date Referred:  10/07/2013 Referred for  SNF Placement   Other Referral:   Interview type:  Patient Other interview type:    PSYCHOSOCIAL DATA Living Status:  ALONE Admitted from facility:   Level of care:   Primary support name:  Wonda King/[phone] Primary support relationship to patient:  CHILD, ADULT Degree of support available:   adequate    CURRENT CONCERNS Current Concerns  Post-Acute Placement   Other Concerns:    SOCIAL WORK ASSESSMENT / PLAN CSW received referral for New SNF. Per RNCM, pt has limited support at home and may require SNF for IV antibiotics.    CSW staffed case with RN and RN reports that it would be a good time to speak with pt.    CSW met with pt at bedside. CSW introduced self and explained role. CSW explored with pt his living arrangement prior to hospitalization and support system. Pt states that he lives alone. Pt shared that he has support from his children and his sister, but states that he does not like to put the burden of his care on others. CSW expressed understanding and provided support, but encouraged pt to discuss his care needs with his family. CSW discussed option of going to short term rehab at [facility] for IV antibiotics given that pt may not have assistance at home. Pt shared that he was considering short term rehab, but would really like to be able to return home and feels that he could ask a family member and learn how to administer medicines. CSW expressed understanding, but explored if pt would be agreeable to exploring option of SNF for rehab in order to have option available in the instance that pt does not feel like he can manage at home once he is medically stable  from the hospital. Pt is agreeable to initiation of SNF search to have options in the instance that pt is unable to return home.    CSW completed FL2 and initiated SNF search to [facility].    CSW to follow up with pt regarding SNF bed offers in order for pt to have options if he is unable to return home, but pt very hopeful that he will be able to manage IV antibiotics at home.    CSW to continue to follow.   Assessment/plan status:  Psychosocial Support/Ongoing Assessment of Needs Other assessment/ plan:   discharge planning   Information/referral to community resources:   Referral to [facility] agencies    PATIENT'S/FAMILY'S RESPONSE TO PLAN OF CARE: Pt alert and oriented x 4. RN reported that pt has been feeling hopeless about his medical situation, but pt appeared in better spirits during CSW visit. Pt was engaged and had appropriate eye contact. Pt expressed that he wishes to return home upon discharge and states that he is very hopeful that he can manage IV abx at home. Pt states the he will discuss with family, but does not want to burden them too much. Pt agreeable to exploring SNF for options in the instance that pt unable to make arrangements and manage at home with IV abx.    Alison Murray, MSW, [place] Work [phone]

## 2013-10-07 NOTE — Progress Notes (Signed)
[facility] PROGRESS NOTE  [name] [name] AYT:[id number] DOB: 1945-09-07 DOA: 10/03/2013 PCP: Garnet Koyanagi, DO   Brief narrative 68 year old male with history of COPD with ongoing tobacco use, hypertension, peripheral vascular disease with 3 months history of fever and cough despite multiple course of antibiotics. Patient seen by pulmonary as outpatient and had bronchoscopy done in August 2015 which showed large amount of necrotic tissue with negative cultures. Patient presented to the ED 09/6 with complaints of fever with cough and weight loss. A CT scan of the chest without contrast showed extensive infiltrative/inflammatory process in the left lung with large cavitary process with air fluid level in the left upper lung again suggestive of abscess versus necrotizing pneumonia. Patient placed on airborne precautions and sputum for AFB sent. (Previously been negative).  ID and pulmonary have been consulted and patient is on broad-spectrum antibiotic as well as IV voriconazole for suspicion of pulmonary aspergillus. Patient on admission had drop in hemoglobin to 7.7 and also with low normal blood pressure for which he was admitted to step down for closer monitoring.   Assessment/Plan: Sepsis with Necrotizing pneumonia/ ? Lung abscess -Continue empiric IV vancomycin and Zosyn along with IV voriconazole for suspicion of Aspergillus infection.Marland Kitchen Appreciate pulmonary and ID recommendation.  Sputum for AFB x3  negative. -d/c airborne precautions.-Quanteferon gold pending. Aspergillus antibody pending , aspergillus galactoma negative . Fungal antibodies panel negative so far.  Sputum cultures  negative as well. -thoracic surgery consulted to evaluate for drainage/ lobe resection for necrotizing pneumonia/abscess but given his previous RUL lobectomy and poor lung reserve and function along with poor nutritional status, surgery thinks he is a poor surgical candidate. -d/w ID recommends at least 3 weeks  of current antibiotics and antifungal. Will order PICC. Will obtain PT. May benefit from SNF.   Acute anemia Suspected to be hemolyzed in the setting of chronic microcytic anemia. He also has underlying chronic kidney disease and alcohol use. Appreciate hematology input. Labs sent for protein electrophoresis, cold agglutinin. Iron panel suggest iron deficiency. Erythropoietin level elevated. B12 normal. Check stool for occult blood. Received PRBC and has improved.  Hypertension Holding meds as  blood pressure has remained soft.  Leukocytosis Secondary to pneumonia/ abscess. Monitor in a.m.  Chronic kidney disease stage III Appears to be at baseline. Monitor  COPD Counseled strongly on smoking cessation. Nicotine patch.  Severe protein calorie malnutrition Added supplements  Peripheral vascular disease Continue aspirin.  Generalized body ache Continue Dilaudid 2.5 mg every 4 hours .  continue Benadryl. Has severe itching with opiates  Code Status: Full code Family Communication: None at bedside Disposition Plan: Currently  inpatient   Consultants:  ID  Pulmonary  Procedures:  None  Antibiotics:  IV vancomycin 9/6--  IV Zosyn 9/7--  IV voriconazole 9/7--  Valtrex  9/7--  HPI/Subjective: Patient seen and examined this morning. He is extremely frustrated and in an irritable mood. He thinks he could have been treated earlier and current situation could've been avoided.   Objective: Filed Vitals:   10/07/13 1200  BP: 128/71  Pulse: 115  Temp: 98.1 F (36.7 C)  Resp: 20    Intake/Output Summary (Last 24 hours) at 10/07/13 1654 Last data filed at 10/07/13 1300  Gross per 24 hour  Intake   2350 ml  Output   2595 ml  Net   -245 ml   Filed Weights   10/04/13 0040 10/04/13 1844 10/04/13 1849  Weight: 53 kg (116 lb 13.5 oz) 55.6 kg (122 lb 9.2  oz) 55.6 kg (122 lb 9.2 oz)    Exam:   General:  Elderly thin built male appears irritable and  angry  HEENT: moist mucosa  Chest: Diminished left lower lung breath sounds , no rhonchi, crackles or wheeze   Cardiovascular: Normal S1 and S2, no murmurs   Abdomen: Soft, nontender, bowel sounds present     Data Reviewed: Basic Metabolic Panel:  Recent Labs Lab 10/03/13 2026 10/04/13 0602 10/05/13 1015 10/06/13 0320 10/07/13 0345  NA 131* 133* 136* 134* 134*  K 4.1 4.2 4.1 4.3 4.2  CL 92* 95* 97 98 98  CO2 22 23 24 24 25   GLUCOSE 126* 126* 116* 136* 93  BUN 21 21 17 15 11   CREATININE 1.52* 1.53* 1.48* 1.45* 1.32  CALCIUM 9.1 8.7 9.2 8.6 8.9   Liver Function Tests:  Recent Labs Lab 10/03/13 2026 10/04/13 0602 10/05/13 1015  AST 11 13 19   ALT 7 7 8   ALKPHOS 96 81 85  BILITOT 0.5 2.0* 0.9  PROT 8.7* 7.5 7.4  ALBUMIN 2.8* 2.4* 2.3*   No results found for this basename: LIPASE, AMYLASE,  in the last 168 hours No results found for this basename: AMMONIA,  in the last 168 hours CBC:  Recent Labs Lab 10/03/13 2026 10/04/13 0602 10/04/13 1621 10/04/13 1743 10/05/13 1015 10/06/13 0320 10/07/13 0345  WBC 14.2* 15.9*  --  13.1* 18.3* 18.1* 15.9*  NEUTROABS 10.3* 12.5*  --  8.7*  --   --   --   HGB 9.3* 7.7* 6.7* 7.3* 10.0* 9.4* 8.9*  HCT 26.6* 22.9* 19.4* 21.1* 28.2* 26.9* 25.6*  MCV 74.1* 74.6*  --  74.6* 75.2* 76.0* 76.4*  PLT 465* 404*  --  382 369 365 383   Cardiac Enzymes: No results found for this basename: CKTOTAL, CKMB, CKMBINDEX, TROPONINI,  in the last 168 hours BNP (last 3 results) No results found for this basename: PROBNP,  in the last 8760 hours CBG:  Recent Labs Lab 10/04/13 0111  GLUCAP 125*    Recent Results (from the past 240 hour(s))  CULTURE, BLOOD (ROUTINE X 2)     Status: None   Collection Time    10/03/13 10:00 PM      Result Value Ref Range Status   Specimen Description BLOOD LEFT ANTECUBITAL   Final   Special Requests BOTTLES DRAWN AEROBIC AND ANAEROBIC 5CC   Final   Culture  Setup Time     Final   Value: 10/04/2013  00:26     Performed at [facility]   Culture     Final   Value:        BLOOD CULTURE RECEIVED NO GROWTH TO DATE CULTURE WILL BE HELD FOR 5 DAYS BEFORE ISSUING A FINAL NEGATIVE REPORT     Performed at [facility]   Report Status PENDING   Incomplete  CULTURE, BLOOD (ROUTINE X 2)     Status: None   Collection Time    10/03/13 10:05 PM      Result Value Ref Range Status   Specimen Description BLOOD LEFT FOREARM   Final   Special Requests BOTTLES DRAWN AEROBIC AND ANAEROBIC 5CC   Final   Culture  Setup Time     Final   Value: 10/04/2013 00:26     Performed at [facility]   Culture     Final   Value:        BLOOD CULTURE RECEIVED NO GROWTH TO DATE CULTURE WILL BE  HELD FOR 5 DAYS BEFORE ISSUING A FINAL NEGATIVE REPORT     Performed at [facility]   Report Status PENDING   Incomplete  URINE CULTURE     Status: None   Collection Time    10/04/13 12:28 AM      Result Value Ref Range Status   Specimen Description URINE, RANDOM   Final   Special Requests NONE   Final   Culture  Setup Time     Final   Value: 10/04/2013 11:07     Performed at [place]     Final   Value: NO GROWTH     Performed at [facility]   Culture     Final   Value: NO GROWTH     Performed at [facility]   Report Status 10/05/2013 FINAL   Final  CULTURE, EXPECTORATED SPUTUM-ASSESSMENT     Status: None   Collection Time    10/04/13 12:48 AM      Result Value Ref Range Status   Specimen Description SPUTUM   Final   Special Requests Immunocompromised   Final   Sputum evaluation     Final   Value: MICROSCOPIC FINDINGS SUGGEST THAT THIS SPECIMEN IS NOT REPRESENTATIVE OF LOWER RESPIRATORY SECRETIONS. PLEASE RECOLLECT. CALLED TO RIMUNDO,J RN AT [phone] 09.07.15 BY TIBBITTS,K   Report Status 10/04/2013 FINAL   Final  AFB CULTURE WITH SMEAR     Status: None   Collection Time    10/04/13  2:55 AM      Result Value Ref Range Status   Specimen  Description SPUTUM   Final   Special Requests Normal   Final   Acid Fast Smear     Final   Value: NO ACID FAST BACILLI SEEN     Performed at [facility]   Culture     Final   Value: CULTURE WILL BE EXAMINED FOR 6 WEEKS BEFORE ISSUING A FINAL REPORT     Performed at [facility]   Report Status PENDING   Incomplete  CULTURE, EXPECTORATED SPUTUM-ASSESSMENT     Status: None   Collection Time    10/04/13  2:55 AM      Result Value Ref Range Status   Specimen Description SPUTUM   Final   Special Requests NONE   Final   Sputum evaluation     Final   Value: THIS SPECIMEN IS ACCEPTABLE. RESPIRATORY CULTURE REPORT TO FOLLOW.   Report Status 10/04/2013 FINAL   Final  CULTURE, RESPIRATORY (NON-EXPECTORATED)     Status: None   Collection Time    10/04/13  2:55 AM      Result Value Ref Range Status   Specimen Description SPUTUM   Final   Special Requests NONE   Final   Gram Stain     Final   Value: MODERATE WBC PRESENT,BOTH PMN AND MONONUCLEAR     RARE SQUAMOUS EPITHELIAL CELLS PRESENT     FEW GRAM POSITIVE COCCI     IN PAIRS IN CLUSTERS FEW GRAM NEGATIVE RODS     Performed at [facility]   Culture     Final   Value: NORMAL OROPHARYNGEAL FLORA     Performed at [facility]   Report Status 10/06/2013 FINAL   Final  FUNGUS CULTURE W SMEAR     Status: None   Collection Time    10/04/13  3:02 AM      Result Value  Ref Range Status   Specimen Description SPUTUM   Final   Special Requests Normal   Final   Fungal Smear     Final   Value: NO YEAST OR FUNGAL ELEMENTS SEEN     Performed at [facility]   Culture     Final   Value: CULTURE IN PROGRESS FOR FOUR WEEKS     Performed at [facility]   Report Status PENDING   Incomplete  MRSA PCR SCREENING     Status: None   Collection Time    10/04/13  6:38 PM      Result Value Ref Range Status   MRSA by PCR NEGATIVE  NEGATIVE Final   Comment:            The GeneXpert MRSA Assay (FDA      approved for NASAL specimens     only), is one component of a     comprehensive MRSA colonization     surveillance program. It is not     intended to diagnose MRSA     infection nor to guide or     monitor treatment for     MRSA infections.  AFB CULTURE WITH SMEAR     Status: None   Collection Time    10/05/13  5:53 AM      Result Value Ref Range Status   Specimen Description SPUTUM   Final   Special Requests Normal   Final   Acid Fast Smear     Final   Value: NO ACID FAST BACILLI SEEN     Performed at [facility]   Culture     Final   Value: CULTURE WILL BE EXAMINED FOR 6 WEEKS BEFORE ISSUING A FINAL REPORT     Performed at [facility]   Report Status PENDING   Incomplete  AFB CULTURE WITH SMEAR     Status: None   Collection Time    10/06/13  5:00 AM      Result Value Ref Range Status   Specimen Description LUNG   Final   Special Requests Normal   Final   Acid Fast Smear     Final   Value: NO ACID FAST BACILLI SEEN     Performed at [facility]   Culture     Final   Value: CULTURE WILL BE EXAMINED FOR 6 WEEKS BEFORE ISSUING A FINAL REPORT     Performed at [facility]   Report Status PENDING   Incomplete     Studies: No results found.  Scheduled Meds: . sodium chloride   Intravenous Once  . amitriptyline  50 mg Oral Daily  . antiseptic oral rinse  7 mL Mouth Rinse BID  . feeding supplement (ENSURE COMPLETE)  237 mL Oral TID BM  . fluticasone  2 spray Each Nare Daily  . latanoprost  1 drop Both Eyes QHS  . nicotine  21 mg Transdermal Daily  . piperacillin-tazobactam (ZOSYN)  IV  3.375 g Intravenous Q8H  . polyethylene glycol  17 g Oral Daily  . valACYclovir  1,000 mg Oral Daily  . vancomycin  1,000 mg Intravenous BID  . voriconazole  200 mg Oral Q12H   Continuous Infusions: . sodium chloride 100 mL/hr at 10/07/13 0800      Time spent: 35 minutes    Carrine Kroboth, [place]  [facility] Pager [phone] If 7PM-7AM, please  contact night-coverage at [email or web address], password [facility] 10/07/2013, 4:54 PM  LOS: 4  days

## 2013-10-07 NOTE — Progress Notes (Signed)
IV team called and stated they will be by tomorrow (10/07/2013) to place PICC line.

## 2013-10-07 NOTE — Progress Notes (Addendum)
Clinical Social Work Department CLINICAL SOCIAL WORK PLACEMENT NOTE 10/07/2013  Patient:  [name] [name],[name] [name]  Account Number:  [id number] Admit date:  10/03/2013  Clinical Social Worker:  Ulyess Blossom  Date/time:  10/07/2013 04:09 PM  Clinical Social Work is seeking post-discharge placement for this patient at the following level of care:   SKILLED NURSING   (*CSW will update this form in Epic as items are completed)   10/07/2013  Patient/family provided with [facility] Department of Clinical Social Work's list of facilities offering this level of care within the geographic area requested by the patient (or if unable, by the patient's family).  10/07/2013  Patient/family informed of their freedom to choose among providers that offer the needed level of care, that participate in Medicare, Medicaid or managed care program needed by the patient, have an available bed and are willing to accept the patient.  10/07/2013  Patient/family informed of [facility]' ownership interest in [facility], as well as of the fact that they are under no obligation to receive care at this facility.  PASARR submitted to EDS on 10/07/2013 PASARR number received on 10/07/2013  FL2 transmitted to all facilities in geographic area requested by pt/family on  10/07/2013 FL2 transmitted to all facilities within larger geographic area on   Patient informed that his/her managed care company has contracts with or will negotiate with  certain facilities, including the following:     Patient/family informed of bed offers received:  10/08/2013 Patient chooses bed at [facility] Physician recommends and patient chooses bed at    Patient to be transferred to  on  [facility] on 10/12/2013 Patient to be transferred to facility by ambulance [facility]) Patient and family notified of transfer on pt notified at bedside; pt declined for CSW to contact any family Name of family member notified:  Pt declined CSW  contacting family member to notify  The following physician request were entered in Epic:   Additional Comments:   Alison Murray, MSW, [place] Work [phone]

## 2013-10-08 DIAGNOSIS — J189 Pneumonia, unspecified organism: Secondary | ICD-10-CM

## 2013-10-08 DIAGNOSIS — D649 Anemia, unspecified: Secondary | ICD-10-CM

## 2013-10-08 DIAGNOSIS — D689 Coagulation defect, unspecified: Secondary | ICD-10-CM

## 2013-10-08 DIAGNOSIS — D890 Polyclonal hypergammaglobulinemia: Secondary | ICD-10-CM

## 2013-10-08 DIAGNOSIS — E8809 Other disorders of plasma-protein metabolism, not elsewhere classified: Secondary | ICD-10-CM

## 2013-10-08 DIAGNOSIS — D72829 Elevated white blood cell count, unspecified: Secondary | ICD-10-CM

## 2013-10-08 LAB — VANCOMYCIN, TROUGH: Vancomycin Tr: 21.6 ug/mL — ABNORMAL HIGH (ref 10.0–20.0)

## 2013-10-08 LAB — COLD AGGLUTININ TITER

## 2013-10-08 MED ORDER — DARBEPOETIN ALFA-POLYSORBATE 300 MCG/0.6ML IJ SOLN
300.0000 ug | Freq: Once | INTRAMUSCULAR | Status: AC
  Administered 2013-10-09: 300 ug via SUBCUTANEOUS
  Filled 2013-10-08: qty 0.6

## 2013-10-08 MED ORDER — SODIUM CHLORIDE 0.9 % IJ SOLN: 10.0000 mL | INTRAMUSCULAR | Status: DC | PRN

## 2013-10-08 MED ORDER — TAMSULOSIN HCL 0.4 MG PO CAPS
0.4000 mg | ORAL_CAPSULE | Freq: Every day | ORAL | Status: DC
Start: 2013-10-08 — End: 2013-10-12
  Administered 2013-10-08 – 2013-10-10 (×3): 0.4 mg via ORAL
  Filled 2013-10-08 (×5): qty 1

## 2013-10-08 MED ORDER — SODIUM CHLORIDE 0.9 % IJ SOLN
10.0000 mL | Freq: Two times a day (BID) | INTRAMUSCULAR | Status: DC
  Administered 2013-10-09: 30 mL
  Administered 2013-10-09 – 2013-10-12 (×4): 10 mL

## 2013-10-08 MED ORDER — SODIUM CHLORIDE 0.9 % IV SOLN
1020.0000 mg | Freq: Once | INTRAVENOUS | Status: AC
  Administered 2013-10-08: 1020 mg via INTRAVENOUS
  Filled 2013-10-08: qty 34

## 2013-10-08 MED ORDER — LIP MEDEX EX OINT
TOPICAL_OINTMENT | CUTANEOUS | Status: AC
  Administered 2013-10-08: 20:00:00
  Filled 2013-10-08: qty 7

## 2013-10-08 MED ORDER — VANCOMYCIN HCL IN DEXTROSE 750-5 MG/150ML-% IV SOLN
750.0000 mg | Freq: Two times a day (BID) | INTRAVENOUS | Status: DC
  Administered 2013-10-08 – 2013-10-10 (×4): 750 mg via INTRAVENOUS
  Filled 2013-10-08 (×5): qty 150

## 2013-10-08 MED ORDER — FERROUS SULFATE 325 (65 FE) MG PO TABS
325.0000 mg | ORAL_TABLET | Freq: Two times a day (BID) | ORAL | Status: DC
  Administered 2013-10-08 – 2013-10-12 (×8): 325 mg via ORAL
  Filled 2013-10-08 (×11): qty 1

## 2013-10-08 NOTE — Evaluation (Signed)
Physical Therapy Evaluation Patient Details Name: [name] [name] MRN: [id number] DOB: 1945/05/10 Today's Date: 10/08/2013   History of Present Illness  Pt arrives by EMS on 10/03/13 with complaints fever for the past 2 weeks-EMS states he was diagnosed with a fungal infection in his lungs 2 weeks ago   Clinical Impression  Patient  Unsteady when standing. Pt will benefit from PT to address problems listed in note. Pt. Prefers to DC to home but lives alone.    Follow Up Recommendations SNF;Supervision/Assistance - 24 hour    Equipment Recommendations  None recommended by PT    Recommendations for Other Services       Precautions / Restrictions Precautions Precautions: Fall      Mobility  Bed Mobility Overal bed mobility: Independent                Transfers Overall transfer level: Needs assistance Equipment used: None Transfers: Sit to/from Stand Sit to Stand: Min guard         General transfer comment: cues for safety, patient indicated that he did not  want  assistance  Ambulation/Gait Ambulation/Gait assistance: Min guard Ambulation Distance (Feet): 5 Feet Assistive device: None Gait Pattern/deviations: Trunk flexed;Ataxic     General Gait Details: difficult to assess due to patient only walked 5 feet,   Stairs            Wheelchair Mobility    Modified Rankin (Stroke Patients Only)       Balance                                             Pertinent Vitals/Pain Pain Location: not rated, stated  in abdomen when he vomits    Home Living Family/patient expects to be discharged to:: Private residence Living Arrangements: Alone   Type of Home: House           Additional Comments: home situation TBD as patient was   somewhat agitated with all the questions.    Prior Function                 Hand Dominance        Extremity/Trunk Assessment   Upper Extremity Assessment: Overall WFL for tasks assessed            Lower Extremity Assessment: RLE deficits/detail;LLE deficits/detail RLE Deficits / Details: appears to have ataxic movements LLE Deficits / Details: similar to R leg, difficult to test due to   moving to new room.     Communication   Communication: No difficulties  Cognition Arousal/Alertness: Awake/alert Behavior During Therapy: Agitated;Flat affect Overall Cognitive Status: Within Functional Limits for tasks assessed                      General Comments      Exercises        Assessment/Plan    PT Assessment Patient needs continued PT services  PT Diagnosis Difficulty walking;Generalized weakness   PT Problem List Decreased strength;Decreased activity tolerance;Decreased balance;Decreased mobility;Decreased knowledge of precautions;Decreased safety awareness;Pain  PT Treatment Interventions DME instruction;Gait training;Functional mobility training;Therapeutic exercise;Therapeutic activities;Patient/family education   PT Goals (Current goals can be found in the Care Plan section) Acute Rehab PT Goals Patient Stated Goal: to go home and live alone so I won't be bothered and can sleep when I want. PT Goal Formulation: With patient Time  For Goal Achievement: 10/22/13 Potential to Achieve Goals: Good    Frequency Min 3X/week   Barriers to discharge Decreased caregiver support      Co-evaluation               End of Session   Activity Tolerance: Patient tolerated treatment well Patient left: in chair;with nursing/sitter in room           Time: 1417-1434 PT Time Calculation (min): 17 min   Charges:   PT Evaluation $Initial PT Evaluation Tier I: 1 Procedure PT Treatments $Gait Training: 8-22 mins   PT G Codes:          Claretha Cooper 10/08/2013, 2:54 PM Tresa Endo PT [phone]

## 2013-10-08 NOTE — Progress Notes (Signed)
Pt trans to 1322 this afternoon. CSW met with him to assist with d/c planning. SNF bed offers provided. Pt hopes to return home following hospital d/c but will consider [facility] and will review SNF bed offers. CSW will continue to follow .  Werner Lean LCSW [phone]

## 2013-10-08 NOTE — Progress Notes (Signed)
Patient ID: [name] [name], male   DOB: Jun 02, 1945, 68 y.o.   MRN: [id number]         [facility] Infectious Disease    Date of Admission:  10/03/2013           Day 6 vancomycin        Day 6 piperacillin tazobactam        Day 6 voriconazole Active Problems:   Hypertension   Peripheral vascular disease   Glaucoma   Smoker   Necrotizing pneumonia   Hospital acquired PNA   Protein-calorie malnutrition, severe   Microcytic anemia   CKD (chronic kidney disease) stage 3, GFR 30-59 ml/min   History of lung abscess   Acute hemolytic anemia   Sepsis   . sodium chloride   Intravenous Once  . amitriptyline  50 mg Oral Daily  . antiseptic oral rinse  7 mL Mouth Rinse BID  . feeding supplement (ENSURE COMPLETE)  237 mL Oral TID BM  . fluticasone  2 spray Each Nare Daily  . latanoprost  1 drop Both Eyes QHS  . nicotine  21 mg Transdermal Daily  . piperacillin-tazobactam (ZOSYN)  IV  3.375 g Intravenous Q8H  . polyethylene glycol  17 g Oral Daily  . valACYclovir  1,000 mg Oral Daily  . vancomycin  1,000 mg Intravenous BID  . voriconazole  200 mg Oral Q12H    Subjective: He states that he is beginning to feel a little bit better. He is still bringing up thick sputum but he states that his cough is less frequent and severe and his sputum is starting to clear up Objective: Temp:  [98.1 F (36.7 C)-100.8 F (38.2 C)] 100.8 F (38.2 C) (09/11 0800) Pulse Rate:  [83-115] 89 (09/11 0600) Resp:  [15-29] 17 (09/11 0600) BP: (107-134)/(63-71) 116/67 mmHg (09/11 0400) SpO2:  [90 %-98 %] 93 % (09/11 0600) Weight:  [123 lb 7.3 oz (56 kg)] 123 lb 7.3 oz (56 kg) (09/11 0500)  General: He looks better today  Lungs: Diminished breath sounds with some crackles on the left.  Cor: Regular S1 and S2 with no murmurs  Lab Results Lab Results  Component Value Date   WBC 15.9* 10/07/2013   HGB 8.9* 10/07/2013   HCT 25.6* 10/07/2013   MCV 76.4* 10/07/2013   PLT 383 10/07/2013    Lab Results   Component Value Date   CREATININE 1.32 10/07/2013   BUN 11 10/07/2013   NA 134* 10/07/2013   K 4.2 10/07/2013   CL 98 10/07/2013   CO2 25 10/07/2013    Lab Results  Component Value Date   ALT 8 10/05/2013   AST 19 10/05/2013   ALKPHOS 85 10/05/2013   BILITOT 0.9 10/05/2013      Microbiology: Recent Results (from the past 240 hour(s))  CULTURE, BLOOD (ROUTINE X 2)     Status: None   Collection Time    10/03/13 10:00 PM      Result Value Ref Range Status   Specimen Description BLOOD LEFT ANTECUBITAL   Final   Special Requests BOTTLES DRAWN AEROBIC AND ANAEROBIC 5CC   Final   Culture  Setup Time     Final   Value: 10/04/2013 00:26     Performed at [facility]   Culture     Final   Value:        BLOOD CULTURE RECEIVED NO GROWTH TO DATE CULTURE WILL BE HELD FOR 5 DAYS BEFORE ISSUING A FINAL NEGATIVE  REPORT     Performed at [facility]   Report Status PENDING   Incomplete  CULTURE, BLOOD (ROUTINE X 2)     Status: None   Collection Time    10/03/13 10:05 PM      Result Value Ref Range Status   Specimen Description BLOOD LEFT FOREARM   Final   Special Requests BOTTLES DRAWN AEROBIC AND ANAEROBIC 5CC   Final   Culture  Setup Time     Final   Value: 10/04/2013 00:26     Performed at [facility]   Culture     Final   Value:        BLOOD CULTURE RECEIVED NO GROWTH TO DATE CULTURE WILL BE HELD FOR 5 DAYS BEFORE ISSUING A FINAL NEGATIVE REPORT     Performed at [facility]   Report Status PENDING   Incomplete  URINE CULTURE     Status: None   Collection Time    10/04/13 12:28 AM      Result Value Ref Range Status   Specimen Description URINE, RANDOM   Final   Special Requests NONE   Final   Culture  Setup Time     Final   Value: 10/04/2013 11:07     Performed at [place]     Final   Value: NO GROWTH     Performed at [facility]   Culture     Final   Value: NO GROWTH     Performed at [facility]   Report  Status 10/05/2013 FINAL   Final  CULTURE, EXPECTORATED SPUTUM-ASSESSMENT     Status: None   Collection Time    10/04/13 12:48 AM      Result Value Ref Range Status   Specimen Description SPUTUM   Final   Special Requests Immunocompromised   Final   Sputum evaluation     Final   Value: MICROSCOPIC FINDINGS SUGGEST THAT THIS SPECIMEN IS NOT REPRESENTATIVE OF LOWER RESPIRATORY SECRETIONS. PLEASE RECOLLECT. CALLED TO RIMUNDO,J RN AT [phone] BY TIBBITTS,K   Report Status 10/04/2013 FINAL   Final  AFB CULTURE WITH SMEAR     Status: None   Collection Time    10/04/13  2:55 AM      Result Value Ref Range Status   Specimen Description SPUTUM   Final   Special Requests Normal   Final   Acid Fast Smear     Final   Value: NO ACID FAST BACILLI SEEN     Performed at [facility]   Culture     Final   Value: CULTURE WILL BE EXAMINED FOR 6 WEEKS BEFORE ISSUING A FINAL REPORT     Performed at [facility]   Report Status PENDING   Incomplete  CULTURE, EXPECTORATED SPUTUM-ASSESSMENT     Status: None   Collection Time    10/04/13  2:55 AM      Result Value Ref Range Status   Specimen Description SPUTUM   Final   Special Requests NONE   Final   Sputum evaluation     Final   Value: THIS SPECIMEN IS ACCEPTABLE. RESPIRATORY CULTURE REPORT TO FOLLOW.   Report Status 10/04/2013 FINAL   Final  CULTURE, RESPIRATORY (NON-EXPECTORATED)     Status: None   Collection Time    10/04/13  2:55 AM      Result Value Ref Range Status   Specimen Description SPUTUM  Final   Special Requests NONE   Final   Gram Stain     Final   Value: MODERATE WBC PRESENT,BOTH PMN AND MONONUCLEAR     RARE SQUAMOUS EPITHELIAL CELLS PRESENT     FEW GRAM POSITIVE COCCI     IN PAIRS IN CLUSTERS FEW GRAM NEGATIVE RODS     Performed at [facility]   Culture     Final   Value: NORMAL OROPHARYNGEAL FLORA     Performed at [facility]   Report Status 10/06/2013 FINAL   Final  FUNGUS CULTURE W  SMEAR     Status: None   Collection Time    10/04/13  3:02 AM      Result Value Ref Range Status   Specimen Description SPUTUM   Final   Special Requests Normal   Final   Fungal Smear     Final   Value: NO YEAST OR FUNGAL ELEMENTS SEEN     Performed at [facility]   Culture     Final   Value: CULTURE IN PROGRESS FOR FOUR WEEKS     Performed at [facility]   Report Status PENDING   Incomplete  MRSA PCR SCREENING     Status: None   Collection Time    10/04/13  6:38 PM      Result Value Ref Range Status   MRSA by PCR NEGATIVE  NEGATIVE Final   Comment:            The GeneXpert MRSA Assay (FDA     approved for NASAL specimens     only), is one component of a     comprehensive MRSA colonization     surveillance program. It is not     intended to diagnose MRSA     infection nor to guide or     monitor treatment for     MRSA infections.  AFB CULTURE WITH SMEAR     Status: None   Collection Time    10/05/13  5:53 AM      Result Value Ref Range Status   Specimen Description SPUTUM   Final   Special Requests Normal   Final   Acid Fast Smear     Final   Value: NO ACID FAST BACILLI SEEN     Performed at [facility]   Culture     Final   Value: CULTURE WILL BE EXAMINED FOR 6 WEEKS BEFORE ISSUING A FINAL REPORT     Performed at [facility]   Report Status PENDING   Incomplete  AFB CULTURE WITH SMEAR     Status: None   Collection Time    10/06/13  5:00 AM      Result Value Ref Range Status   Specimen Description LUNG   Final   Special Requests Normal   Final   Acid Fast Smear     Final   Value: NO ACID FAST BACILLI SEEN     Performed at [facility]   Culture     Final   Value: CULTURE WILL BE EXAMINED FOR 6 WEEKS BEFORE ISSUING A FINAL REPORT     Performed at [facility]   Report Status PENDING   Incomplete    Assessment: He is making slow progress on therapy for severe necrotizing pneumonia. I would like to continue his  current antimicrobial regimen for at least 3 weeks total.  Plan: 1. Continue vancomycin, piperacillin tazobactam and voriconazole  2. Please call me for any infectious disease questions this weekend  Michel Bickers, MD [facility] [place] [phone] pager   [phone] cell 10/08/2013, 10:17 AM

## 2013-10-08 NOTE — Progress Notes (Signed)
CSW assisting with d/c planning. Pt no longer requires airborne precautions. New SNF search initiated. Bed offers are pending .  Werner Lean LCSW [phone]

## 2013-10-08 NOTE — Progress Notes (Signed)
ANTIBIOTIC CONSULT NOTE - INITIAL  Pharmacy Consult for Vancomycin; renally adjust abx Indication: pneumonia  Assessment: 68 yo M smoker with history of R upper lobectomy (NSCLC 08).  Now with severe left-sided necrotizing pneumonia that seems to have been slowly worsening over the last several months. Negative interferon gamma release assay for latent TB in July and AFB Cx negative on bronchoscopy in August, only positive sputum Cx has been M. Gordonae (nonpathogenic).  However, transbronchial Bx noted to have fungi on 8/20. Pharmacy consulted to dose vancomycin for PNA; Zosyn and Voriconazole per MD.  9/6 >>Vanc >> 9/6 >>Cefepime >> x1 ED 9/7>>Zosyn (MD) >> 9/7>>Vfend (MD) >>  PTA Valtrex resumed >>  Tmax 100.8 F, continuing to spike low-grade fevers WBC moderately elevated but trending slowly down SCr trending slowly down (appears to be approaching baseline); CrCl ~ 44 ml/min  9/7 sputum cx: normal flora (few GPCs; GNR), final 9/6 blood x 2: NGTD 9/7 urine: NGF 9/6 Quant TB gold: IP 9/7 AFB Cx #1 smear negative; Cx pending 9/8 AFB Cx #2: smear negative; Cx pending 9/9 AFB Cx #3: smear negative; Cx pending 9/7 fungus cx with smear: no organisms seen on smear; Cx pending 9/7 aspergillus galactomannan Ag: not detected 9/7 aspergillus Ab: IP 9/7 HIV Ab: nonreactive 9/7 strep ur ag: neg 9/7 legionella ur ag: neg  Significant Events 9/9 Vanc trough = 7.6 @ 2047, changed to 1g IV q12h 9/11 vanc trough = 21.6 @ 0900  Goal of Therapy:  Vancomycin trough level 15-20 mcg/ml  Plan:   1000 dose today (1000 mg) has already been hung.  Will instruct nurse to only give 3/4 of bag contents to deliver 750 mg (may count 10:00 dose on 9/11 as first dose of new regimen)  Decrease vancomycin to 750mg  IV q 12 hours.  Zosyn, voriconazole dosing is appropriate for renal function; no adjustments needed  Follow up culture results, clinical course  Check trough again on 9/13 AM.  Per ID, plan  is to continue current regimen for 21 days total; PICC ordered  ==  Allergies  Allergen Reactions  . Morphine And Related Itching    Head to toe  . Chantix [Varenicline] Other (See Comments)    Dreams, itching, suicidal ideation  . Hydromorphone Hcl Itching    Tolerates Hydrocodone-Acetaminophen  . Ibuprofen Nausea And Vomiting  . Omnipaque [Iohexol]      Code: HIVES, Desc: FACIAL HIVES S/P IV CONTRAST.Marland KitchenMarland KitchenOK W/ 25MG  BENADRYL//A.C., Onset Date: [id number]   . Shellfish Allergy Swelling    Patient Measurements: Height: 5\' 7"  (170.2 cm) Weight: 123 lb 7.3 oz (56 kg) IBW/kg (Calculated) : 66.1 Adjusted Body Weight:   Vital Signs: Temp: 100.8 F (38.2 C) (09/11 0800) Temp src: Oral (09/11 0800) BP: 116/67 mmHg (09/11 0400) Pulse Rate: 89 (09/11 0600) Intake/Output from previous day: 09/10 0701 - 09/11 0700 In: 3780 [P.O.:930; I.V.:2300; IV Piggyback:550] Out: 3100 [Urine:3100] Intake/Output from this shift:    Labs:  Recent Labs  10/05/13 1015 10/06/13 0320 10/07/13 0345  WBC 18.3* 18.1* 15.9*  HGB 10.0* 9.4* 8.9*  PLT 369 365 383  CREATININE 1.48* 1.45* 1.32   Estimated Creatinine Clearance: 44.2 ml/min (by C-G formula based on Cr of 1.32).  Recent Labs  10/06/13 2047 10/08/13 0910  VANCOTROUGH 7.6* 21.6*     Microbiology: Recent Results (from the past 720 hour(s))  AFB CULTURE WITH SMEAR     Status: None   Collection Time    09/16/13  8:23 AM  Result Value Ref Range Status   Specimen Description BRONCHIAL ALVEOLAR LAVAGE   Final   Special Requests Normal   Final   Acid Fast Smear     Final   Value: NO ACID FAST BACILLI SEEN     Performed at [facility]   Culture     Final   Value: CULTURE WILL BE EXAMINED FOR 6 WEEKS BEFORE ISSUING A FINAL REPORT     Performed at [facility]   Report Status PENDING   Incomplete  FUNGUS CULTURE W SMEAR     Status: None   Collection Time    09/16/13  8:23 AM      Result Value Ref Range Status    Specimen Description BRONCHIAL ALVEOLAR LAVAGE   Final   Special Requests Normal   Final   Fungal Smear     Final   Value: NO YEAST OR FUNGAL ELEMENTS SEEN     Performed at [facility]   Culture     Final   Value: CULTURE IN PROGRESS FOR FOUR WEEKS     Performed at [facility]   Report Status PENDING   Incomplete  CULTURE, RESPIRATORY (NON-EXPECTORATED)     Status: None   Collection Time    09/16/13  8:35 AM      Result Value Ref Range Status   Specimen Description BRONCHIAL ALVEOLAR LAVAGE   Final   Special Requests Normal   Final   Gram Stain     Final   Value: ABUNDANT WBC PRESENT, PREDOMINANTLY PMN     NO SQUAMOUS EPITHELIAL CELLS SEEN     RARE GRAM POSITIVE COCCI     IN PAIRS     Performed at [facility]   Culture     Final   Value: Non-Pathogenic Oropharyngeal-type Flora Isolated.     Performed at [facility]   Report Status 09/18/2013 FINAL   Final  CULTURE, BLOOD (ROUTINE X 2)     Status: None   Collection Time    10/03/13 10:00 PM      Result Value Ref Range Status   Specimen Description BLOOD LEFT ANTECUBITAL   Final   Special Requests BOTTLES DRAWN AEROBIC AND ANAEROBIC 5CC   Final   Culture  Setup Time     Final   Value: 10/04/2013 00:26     Performed at [facility]   Culture     Final   Value:        BLOOD CULTURE RECEIVED NO GROWTH TO DATE CULTURE WILL BE HELD FOR 5 DAYS BEFORE ISSUING A FINAL NEGATIVE REPORT     Performed at [facility]   Report Status PENDING   Incomplete  CULTURE, BLOOD (ROUTINE X 2)     Status: None   Collection Time    10/03/13 10:05 PM      Result Value Ref Range Status   Specimen Description BLOOD LEFT FOREARM   Final   Special Requests BOTTLES DRAWN AEROBIC AND ANAEROBIC 5CC   Final   Culture  Setup Time     Final   Value: 10/04/2013 00:26     Performed at [facility]   Culture     Final   Value:        BLOOD CULTURE RECEIVED NO GROWTH TO DATE CULTURE WILL BE HELD  FOR 5 DAYS BEFORE ISSUING A FINAL NEGATIVE REPORT     Performed at [facility]   Report Status  PENDING   Incomplete  URINE CULTURE     Status: None   Collection Time    10/04/13 12:28 AM      Result Value Ref Range Status   Specimen Description URINE, RANDOM   Final   Special Requests NONE   Final   Culture  Setup Time     Final   Value: 10/04/2013 11:07     Performed at [facility]     Final   Value: NO GROWTH     Performed at [facility]   Culture     Final   Value: NO GROWTH     Performed at [facility]   Report Status 10/05/2013 FINAL   Final  CULTURE, EXPECTORATED SPUTUM-ASSESSMENT     Status: None   Collection Time    10/04/13 12:48 AM      Result Value Ref Range Status   Specimen Description SPUTUM   Final   Special Requests Immunocompromised   Final   Sputum evaluation     Final   Value: MICROSCOPIC FINDINGS SUGGEST THAT THIS SPECIMEN IS NOT REPRESENTATIVE OF LOWER RESPIRATORY SECRETIONS. PLEASE RECOLLECT. CALLED TO RIMUNDO,J RN AT [phone] BY TIBBITTS,K   Report Status 10/04/2013 FINAL   Final  AFB CULTURE WITH SMEAR     Status: None   Collection Time    10/04/13  2:55 AM      Result Value Ref Range Status   Specimen Description SPUTUM   Final   Special Requests Normal   Final   Acid Fast Smear     Final   Value: NO ACID FAST BACILLI SEEN     Performed at [facility]   Culture     Final   Value: CULTURE WILL BE EXAMINED FOR 6 WEEKS BEFORE ISSUING A FINAL REPORT     Performed at [facility]   Report Status PENDING   Incomplete  CULTURE, EXPECTORATED SPUTUM-ASSESSMENT     Status: None   Collection Time    10/04/13  2:55 AM      Result Value Ref Range Status   Specimen Description SPUTUM   Final   Special Requests NONE   Final   Sputum evaluation     Final   Value: THIS SPECIMEN IS ACCEPTABLE. RESPIRATORY CULTURE REPORT TO FOLLOW.   Report Status 10/04/2013 FINAL   Final  CULTURE,  RESPIRATORY (NON-EXPECTORATED)     Status: None   Collection Time    10/04/13  2:55 AM      Result Value Ref Range Status   Specimen Description SPUTUM   Final   Special Requests NONE   Final   Gram Stain     Final   Value: MODERATE WBC PRESENT,BOTH PMN AND MONONUCLEAR     RARE SQUAMOUS EPITHELIAL CELLS PRESENT     FEW GRAM POSITIVE COCCI     IN PAIRS IN CLUSTERS FEW GRAM NEGATIVE RODS     Performed at [facility]   Culture     Final   Value: NORMAL OROPHARYNGEAL FLORA     Performed at [facility]   Report Status 10/06/2013 FINAL   Final  FUNGUS CULTURE W SMEAR     Status: None   Collection Time    10/04/13  3:02 AM      Result Value Ref Range Status   Specimen Description SPUTUM   Final   Special Requests Normal   Final   Fungal Smear  Final   Value: NO YEAST OR FUNGAL ELEMENTS SEEN     Performed at [facility]   Culture     Final   Value: CULTURE IN PROGRESS FOR FOUR WEEKS     Performed at [facility]   Report Status PENDING   Incomplete  MRSA PCR SCREENING     Status: None   Collection Time    10/04/13  6:38 PM      Result Value Ref Range Status   MRSA by PCR NEGATIVE  NEGATIVE Final   Comment:            The GeneXpert MRSA Assay (FDA     approved for NASAL specimens     only), is one component of a     comprehensive MRSA colonization     surveillance program. It is not     intended to diagnose MRSA     infection nor to guide or     monitor treatment for     MRSA infections.  AFB CULTURE WITH SMEAR     Status: None   Collection Time    10/05/13  5:53 AM      Result Value Ref Range Status   Specimen Description SPUTUM   Final   Special Requests Normal   Final   Acid Fast Smear     Final   Value: NO ACID FAST BACILLI SEEN     Performed at [facility]   Culture     Final   Value: CULTURE WILL BE EXAMINED FOR 6 WEEKS BEFORE ISSUING A FINAL REPORT     Performed at [facility]   Report Status PENDING    Incomplete  AFB CULTURE WITH SMEAR     Status: None   Collection Time    10/06/13  5:00 AM      Result Value Ref Range Status   Specimen Description LUNG   Final   Special Requests Normal   Final   Acid Fast Smear     Final   Value: NO ACID FAST BACILLI SEEN     Performed at [facility]   Culture     Final   Value: CULTURE WILL BE EXAMINED FOR 6 WEEKS BEFORE ISSUING A FINAL REPORT     Performed at [facility]   Report Status PENDING   Incomplete    Medical History: Past Medical History  Diagnosis Date  . Peripheral vascular disease     with claudication  . Pancreatitis     chronic  . Hypertension   . Glaucoma   . COPD (chronic obstructive pulmonary disease)   . Ulcer     peptic ulcer disease  . H/O alcohol abuse   . History of tobacco abuse   . PONV (postoperative nausea and vomiting)   . Anemia   . GERD (gastroesophageal reflux disease)     "NOT TO BAD NOW"  . Carpal tunnel syndrome, bilateral   . Colon polyps 12/2011  . Prostate carcinoma   . Lung cancer, upper lobe 2007    Medications:  Scheduled:  . sodium chloride   Intravenous Once  . amitriptyline  50 mg Oral Daily  . antiseptic oral rinse  7 mL Mouth Rinse BID  . feeding supplement (ENSURE COMPLETE)  237 mL Oral TID BM  . fluticasone  2 spray Each Nare Daily  . latanoprost  1 drop Both Eyes QHS  . nicotine  21 mg Transdermal Daily  . piperacillin-tazobactam (  ZOSYN)  IV  3.375 g Intravenous Q8H  . polyethylene glycol  17 g Oral Daily  . valACYclovir  1,000 mg Oral Daily  . vancomycin  1,000 mg Intravenous BID  . voriconazole  200 mg Oral Q12H   Infusions:  . sodium chloride 1,000 mL (10/08/13 0758)   PRN: acetaminophen, albuterol, allopurinol, aspirin, benzonatate, diphenhydrAMINE, guaiFENesin-codeine, HYDROmorphone (DILAUDID) injection, hydrOXYzine, ondansetron  Reuel Boom, PharmD Pager: [phone] 10/08/2013, 10:13 AM

## 2013-10-08 NOTE — Progress Notes (Signed)
REPORT CALLED TO KIM RN [facility]EAST- Ava [phone]- NO CHANGE IN HIS CONDITION.

## 2013-10-08 NOTE — Progress Notes (Signed)
CARE MANAGEMENT NOTE 10/08/2013  Patient:  [name],[name]   Account Number:  [id number]  Date Initiated:  10/05/2013  Documentation initiated by:  Gabryel Files  Subjective/Objective Assessment:   pt with lung ca and lung abcess     Action/Plan:   home when stable   Anticipated DC Date:  10/11/2013   Anticipated DC Plan:  HOME/SELF CARE  In-house referral  NA      DC Planning Services  CM consult      PAC Choice  NA   Choice offered to / List presented to:  NA      DME agency  NA     [facility] arranged  NA      [place] agency  NA   Status of service:  In process, will continue to follow Medicare Important Message given?   (If response is "NO", the following Medicare IM given date fields will be blank) Date Medicare IM given:   Medicare IM given by:   Date Additional Medicare IM given:   Additional Medicare IM given by:    Discharge Disposition:    Per UR Regulation:  Reviewed for med. necessity/level of care/duration of stay  If discussed at [place] of Stay Meetings, dates discussed:    Comments:  [name] Golla,RN,BSN,CCM [id number]/RLD/patient transferring out of scu to med surg floor/lung abcess present on films wbc 15.9/iv's and iv abx ongoing/afb smears neg for tb/Aspergillus antigen neg x2/off resp isoplation as of [id number]/Ceruplasmin is elevated_confusion??? [name] [name]'s diease?????/csw looking for snf placement.

## 2013-10-08 NOTE — Progress Notes (Signed)
[name] [name]   DOB:1945/02/03   OV#:[id number]   XAJ#:[id number]  Subjective: " I am feeling better". Patient is using incentive spirometry, can inspire to 1250 ml. Not getting physical therapy which should be done now. More alert, coherent and responsive. I am following him for anemia.   Objective:  Filed Vitals:   10/08/13 1532  BP: 135/80  Pulse: 98  Temp: 99.4 F (37.4 C)  Resp: 20    Body mass index is 19.33 kg/(m^2).  Intake/Output Summary (Last 24 hours) at 10/08/13 1629 Last data filed at 10/08/13 1400  Gross per 24 hour  Intake   3190 ml  Output   3950 ml  Net   -760 ml     Sclerae unicteric, pallor+  Oropharynx clear  No peripheral adenopathy  Lungs clear -- no rales or rhonchi  Heart regular rate and rhythm  Abdomen benign  MSK no focal spinal tenderness, no peripheral edema  Neuro nonfocal    LABS:       Copper: pending Zinc: pending Ceruloplasmin: 44 High (18-36) Vitamin C: pending Vitamin B12: 444 Folate: 14.4  PTT: 48 PT/INR: 16.2/1.3 IgE: pending IgG: 1740 elevated IgM 103 normal IgA: 268 normal SPEP: No M spike; 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 It was 5.5 9 months ago Epo level: 40 high Aspergillus Galactomannan Ag not detected Stool occult testing x 3: pending? LDH: 176 normal Haptoglobin: 369 high pro-inflammatory, unlikely hemolysis Retic count 0.9% DAT Complement +, IgG negative ANA, ANCA negative HIV negative RPR negative  Studies: CT chest 10/03/2013 reviewed, no new imaging studies  Assessment: 68 y.o. with cavitary pneumonia and abscess, we saw for anemia. Extensive testing was done which shows iron deficiency, elevation of markers of inflammation  (ferritin, ceruloplasmin, white count). He has leukocytosis and normal platelet count and normal renal function. Mild coagulopathy with elevated pt/ptt, he has hypoalbuminemia. Liver functions otherwise normalized on 10/05/2013. He has polyclonal hypergammaglobulinemia sign of inflammation but no monoclonal M spike showing no plasma cell disorder. To treat anemia I will recommend the Feraheme infusion x 1 and giving him 1 dose of aranesp 300 mcg sq. We will replace iron today and do aranesp tomorrow. I do not see evidence of hemolytic anemia based on his lab results which is reported by some consultants (normal LDH, normal retic count, normal haptoglobin and negative coomb test). He likely have some nutritional deficiencies (waiting for copper,zinc and vitamin C results), iron deficiency and anemia of chronic disease (elevated ferritin) exacerbated by this ongoing pulmonary infection or abscess. Anemia is usually secondary manifestation of underlying pathological process affecting the body and in his case appears to be multifactorial.  Aranesp will help his anemia. Our team will follow his progress and if he gets discharged, I can follow him in hematology clinic as an outpatient.  Time spent: 25 minutes  [place], MD Medical Hematologist/Oncologist [place] Pager: [phone] Office No: [phone]

## 2013-10-08 NOTE — Progress Notes (Signed)
[facility] PROGRESS NOTE  [name] [name] XIP:[id number] DOB: 04-19-1945 DOA: 10/03/2013 PCP: Garnet Koyanagi, DO   Brief narrative 68 year old male with history of COPD with ongoing tobacco use, hypertension, peripheral vascular disease with 3 months history of fever and cough despite multiple course of antibiotics. Patient seen by pulmonary as outpatient and had bronchoscopy done in August 2015 which showed large amount of necrotic tissue with negative cultures. Patient presented to the ED 09/6 with complaints of fever with cough and weight loss. A CT scan of the chest without contrast showed extensive infiltrative/inflammatory process in the left lung with large cavitary process with air fluid level in the left upper lung again suggestive of abscess versus necrotizing pneumonia. Patient placed on airborne precautions and sputum for AFB sent. (Previously been negative).  ID and pulmonary have been consulted and patient is on broad-spectrum antibiotic as well as IV voriconazole for suspicion of pulmonary aspergillus. Patient on admission had drop in hemoglobin to 7.7 and also with low normal blood pressure for which he was admitted to step down for closer monitoring.   Assessment/Plan: Sepsis with Necrotizing pneumonia/ ? Lung abscess - empiric IV vancomycin and Zosyn along with IV voriconazole for suspicion of Aspergillus infection.Marland Kitchen Appreciate pulmonary and ID recommendation. Plan on at least 3 weeks of antibiotics for now. PICC ordered. Sputum for AFB x3  negative. -Discontinued airborne precautions.-Quanteferon gold pending. Aspergillus antibody pending , aspergillus galactoma negative . Fungal antibodies panel negative so far. Sputum cultures  negative as well. -thoracic surgery consulted to evaluate for drainage/ lobe resection for necrotizing pneumonia/abscess but given his previous RUL lobectomy and poor lung reserve and function along with poor nutritional status, surgery thinks he is a  poor surgical candidate. -PT evaluation pending. May benefit from skilled nursing facility. Social worker consulted. Patient still undecided about going home versus going to SNF if eligible. -Fever and leukocytosis improving -Transfer to medical floor. -Supportive care with Tylenol and antitussives.  Acute anemia Suspected to be hemolyzed in the setting of chronic microcytic anemia. He also has underlying chronic kidney disease and alcohol use. In bowel suggestive of iron deficiency. Appreciate hematology input. Elevated reticulocyte and haptoglobin. Labs sent for protein electrophoresis shows nonspecific increase in gamma globulin Received PRBC and has improved. -will add iron supplement  Hypertension Holding meds it is soft and blood pressure   Chronic kidney disease stage III Appears to be at baseline. Monitor  COPD Counseled strongly on smoking cessation. Nicotine patch.  Severe protein calorie malnutrition Added supplements  Peripheral vascular disease Continue aspirin.  Generalized body ache Continue Dilaudid 2.5 mg every 4 hours .  continue Benadryl. Has severe itching with opiates  Code Status: Full code Family Communication: None at bedside Disposition Plan: Home versus skilled nursing facility in 1-2 days   Consultants:  ID  Pulmonary  Procedures:  None  Antibiotics:  IV vancomycin 9/6--  IV Zosyn 9/7--  IV voriconazole 9/7--  Valtrex  9/7--  HPI/Subjective: Patient seen and examined this morning. He still has productive cough but sputum is less purulent. MAXIMUM TEMPERATURE of 100.77F. He still has chest discomfort with cough but denies shortness of breath. Patient apologetic about his gait it was in behavior with myself as well as other physicians.   Objective: Filed Vitals:   10/08/13 0800  BP:   Pulse:   Temp: 100.8 F (38.2 C)  Resp:     Intake/Output Summary (Last 24 hours) at 10/08/13 1041 Last data filed at 10/08/13 0600  Gross  per 24  hour  Intake   3230 ml  Output   2750 ml  Net    480 ml   Filed Weights   10/04/13 1844 10/04/13 1849 10/08/13 0500  Weight: 55.6 kg (122 lb 9.2 oz) 55.6 kg (122 lb 9.2 oz) 56 kg (123 lb 7.3 oz)    Exam:   General:  Elderly thin built male in no acute distress  HEENT: moist mucosa  Chest: Crackles over left lung , no rhonchi,  or wheeze   Cardiovascular: Normal S1 and S2, no murmurs   Abdomen: Soft, nontender, bowel sounds present   Extremities: Warm, no edema    Data Reviewed: Basic Metabolic Panel:  Recent Labs Lab 10/03/13 2026 10/04/13 0602 10/05/13 1015 10/06/13 0320 10/07/13 0345  NA 131* 133* 136* 134* 134*  K 4.1 4.2 4.1 4.3 4.2  CL 92* 95* 97 98 98  CO2 22 23 24 24 25   GLUCOSE 126* 126* 116* 136* 93  BUN 21 21 17 15 11   CREATININE 1.52* 1.53* 1.48* 1.45* 1.32  CALCIUM 9.1 8.7 9.2 8.6 8.9   Liver Function Tests:  Recent Labs Lab 10/03/13 2026 10/04/13 0602 10/05/13 1015  AST 11 13 19   ALT 7 7 8   ALKPHOS 96 81 85  BILITOT 0.5 2.0* 0.9  PROT 8.7* 7.5 7.4  ALBUMIN 2.8* 2.4* 2.3*   No results found for this basename: LIPASE, AMYLASE,  in the last 168 hours No results found for this basename: AMMONIA,  in the last 168 hours CBC:  Recent Labs Lab 10/03/13 2026 10/04/13 0602 10/04/13 1621 10/04/13 1743 10/05/13 1015 10/06/13 0320 10/07/13 0345  WBC 14.2* 15.9*  --  13.1* 18.3* 18.1* 15.9*  NEUTROABS 10.3* 12.5*  --  8.7*  --   --   --   HGB 9.3* 7.7* 6.7* 7.3* 10.0* 9.4* 8.9*  HCT 26.6* 22.9* 19.4* 21.1* 28.2* 26.9* 25.6*  MCV 74.1* 74.6*  --  74.6* 75.2* 76.0* 76.4*  PLT 465* 404*  --  382 369 365 383   Cardiac Enzymes: No results found for this basename: CKTOTAL, CKMB, CKMBINDEX, TROPONINI,  in the last 168 hours BNP (last 3 results) No results found for this basename: PROBNP,  in the last 8760 hours CBG:  Recent Labs Lab 10/04/13 0111  GLUCAP 125*    Recent Results (from the past 240 hour(s))  CULTURE, BLOOD  (ROUTINE X 2)     Status: None   Collection Time    10/03/13 10:00 PM      Result Value Ref Range Status   Specimen Description BLOOD LEFT ANTECUBITAL   Final   Special Requests BOTTLES DRAWN AEROBIC AND ANAEROBIC 5CC   Final   Culture  Setup Time     Final   Value: 10/04/2013 00:26     Performed at [facility]   Culture     Final   Value:        BLOOD CULTURE RECEIVED NO GROWTH TO DATE CULTURE WILL BE HELD FOR 5 DAYS BEFORE ISSUING A FINAL NEGATIVE REPORT     Performed at [facility]   Report Status PENDING   Incomplete  CULTURE, BLOOD (ROUTINE X 2)     Status: None   Collection Time    10/03/13 10:05 PM      Result Value Ref Range Status   Specimen Description BLOOD LEFT FOREARM   Final   Special Requests BOTTLES DRAWN AEROBIC AND ANAEROBIC 5CC   Final   Culture  Setup Time  Final   Value: 10/04/2013 00:26     Performed at [facility]   Culture     Final   Value:        BLOOD CULTURE RECEIVED NO GROWTH TO DATE CULTURE WILL BE HELD FOR 5 DAYS BEFORE ISSUING A FINAL NEGATIVE REPORT     Performed at [facility]   Report Status PENDING   Incomplete  URINE CULTURE     Status: None   Collection Time    10/04/13 12:28 AM      Result Value Ref Range Status   Specimen Description URINE, RANDOM   Final   Special Requests NONE   Final   Culture  Setup Time     Final   Value: 10/04/2013 11:07     Performed at [place]     Final   Value: NO GROWTH     Performed at [facility]   Culture     Final   Value: NO GROWTH     Performed at [facility]   Report Status 10/05/2013 FINAL   Final  CULTURE, EXPECTORATED SPUTUM-ASSESSMENT     Status: None   Collection Time    10/04/13 12:48 AM      Result Value Ref Range Status   Specimen Description SPUTUM   Final   Special Requests Immunocompromised   Final   Sputum evaluation     Final   Value: MICROSCOPIC FINDINGS SUGGEST THAT THIS SPECIMEN IS NOT  REPRESENTATIVE OF LOWER RESPIRATORY SECRETIONS. PLEASE RECOLLECT. CALLED TO RIMUNDO,J RN AT [phone] BY TIBBITTS,K   Report Status 10/04/2013 FINAL   Final  AFB CULTURE WITH SMEAR     Status: None   Collection Time    10/04/13  2:55 AM      Result Value Ref Range Status   Specimen Description SPUTUM   Final   Special Requests Normal   Final   Acid Fast Smear     Final   Value: NO ACID FAST BACILLI SEEN     Performed at [facility]   Culture     Final   Value: CULTURE WILL BE EXAMINED FOR 6 WEEKS BEFORE ISSUING A FINAL REPORT     Performed at [facility]   Report Status PENDING   Incomplete  CULTURE, EXPECTORATED SPUTUM-ASSESSMENT     Status: None   Collection Time    10/04/13  2:55 AM      Result Value Ref Range Status   Specimen Description SPUTUM   Final   Special Requests NONE   Final   Sputum evaluation     Final   Value: THIS SPECIMEN IS ACCEPTABLE. RESPIRATORY CULTURE REPORT TO FOLLOW.   Report Status 10/04/2013 FINAL   Final  CULTURE, RESPIRATORY (NON-EXPECTORATED)     Status: None   Collection Time    10/04/13  2:55 AM      Result Value Ref Range Status   Specimen Description SPUTUM   Final   Special Requests NONE   Final   Gram Stain     Final   Value: MODERATE WBC PRESENT,BOTH PMN AND MONONUCLEAR     RARE SQUAMOUS EPITHELIAL CELLS PRESENT     FEW GRAM POSITIVE COCCI     IN PAIRS IN CLUSTERS FEW GRAM NEGATIVE RODS     Performed at [facility]   Culture     Final   Value: NORMAL OROPHARYNGEAL FLORA  Performed at [facility]   Report Status 10/06/2013 FINAL   Final  FUNGUS CULTURE W SMEAR     Status: None   Collection Time    10/04/13  3:02 AM      Result Value Ref Range Status   Specimen Description SPUTUM   Final   Special Requests Normal   Final   Fungal Smear     Final   Value: NO YEAST OR FUNGAL ELEMENTS SEEN     Performed at [facility]   Culture     Final   Value: CULTURE IN PROGRESS FOR FOUR WEEKS      Performed at [facility]   Report Status PENDING   Incomplete  MRSA PCR SCREENING     Status: None   Collection Time    10/04/13  6:38 PM      Result Value Ref Range Status   MRSA by PCR NEGATIVE  NEGATIVE Final   Comment:            The GeneXpert MRSA Assay (FDA     approved for NASAL specimens     only), is one component of a     comprehensive MRSA colonization     surveillance program. It is not     intended to diagnose MRSA     infection nor to guide or     monitor treatment for     MRSA infections.  AFB CULTURE WITH SMEAR     Status: None   Collection Time    10/05/13  5:53 AM      Result Value Ref Range Status   Specimen Description SPUTUM   Final   Special Requests Normal   Final   Acid Fast Smear     Final   Value: NO ACID FAST BACILLI SEEN     Performed at [facility]   Culture     Final   Value: CULTURE WILL BE EXAMINED FOR 6 WEEKS BEFORE ISSUING A FINAL REPORT     Performed at [facility]   Report Status PENDING   Incomplete  AFB CULTURE WITH SMEAR     Status: None   Collection Time    10/06/13  5:00 AM      Result Value Ref Range Status   Specimen Description LUNG   Final   Special Requests Normal   Final   Acid Fast Smear     Final   Value: NO ACID FAST BACILLI SEEN     Performed at [facility]   Culture     Final   Value: CULTURE WILL BE EXAMINED FOR 6 WEEKS BEFORE ISSUING A FINAL REPORT     Performed at [facility]   Report Status PENDING   Incomplete     Studies: No results found.  Scheduled Meds: . sodium chloride   Intravenous Once  . amitriptyline  50 mg Oral Daily  . antiseptic oral rinse  7 mL Mouth Rinse BID  . feeding supplement (ENSURE COMPLETE)  237 mL Oral TID BM  . fluticasone  2 spray Each Nare Daily  . latanoprost  1 drop Both Eyes QHS  . nicotine  21 mg Transdermal Daily  . piperacillin-tazobactam (ZOSYN)  IV  3.375 g Intravenous Q8H  . polyethylene glycol  17 g Oral Daily  . sodium  chloride  10-40 mL Intracatheter Q12H  . valACYclovir  1,000 mg Oral Daily  . vancomycin  750 mg Intravenous BID  .  voriconazole  200 mg Oral Q12H   Continuous Infusions: . sodium chloride 1,000 mL (10/08/13 0758)      Time spent: 25 minutes    Danalee Flath  [facility] Pager [phone] If 7PM-7AM, please contact night-coverage at [email or web address], password [facility] 10/08/2013, 10:41 AM  LOS: 5 days

## 2013-10-08 NOTE — Progress Notes (Signed)
Peripherally Inserted Central Catheter/Midline Placement  The IV Nurse has discussed with the patient and/or persons authorized to consent for the patient, the purpose of this procedure and the potential benefits and risks involved with this procedure.  The benefits include less needle sticks, lab draws from the catheter and patient may be discharged home with the catheter.  Risks include, but not limited to, infection, bleeding, blood clot (thrombus formation), and puncture of an artery; nerve damage and irregular heat beat.  Alternatives to this procedure were also discussed.  PICC/Midline Placement Documentation        [name] [name] 10/08/2013, 10:31 AM

## 2013-10-09 DIAGNOSIS — D509 Iron deficiency anemia, unspecified: Secondary | ICD-10-CM | POA: Diagnosis present

## 2013-10-09 LAB — CBC
HCT: 25.7 % — ABNORMAL LOW (ref 39.0–52.0)
Hemoglobin: 8.5 g/dL — ABNORMAL LOW (ref 13.0–17.0)
MCH: 26.1 pg (ref 26.0–34.0)
MCHC: 33.1 g/dL (ref 30.0–36.0)
MCV: 78.8 fL (ref 78.0–100.0)
Platelets: 362 10*3/uL (ref 150–400)
RBC: 3.26 MIL/uL — ABNORMAL LOW (ref 4.22–5.81)
RDW: 17.6 % — AB (ref 11.5–15.5)
WBC: 16.9 10*3/uL — ABNORMAL HIGH (ref 4.0–10.5)

## 2013-10-09 LAB — ASPERGILLUS ANTIBODY (COMPLEMENT FIX): Aspergillus Antibody by Complement Fix: 1:32 {titer} — ABNORMAL HIGH

## 2013-10-09 LAB — VITAMIN C: VITAMIN C: 0.1 mg/dL — AB (ref 0.2–1.5)

## 2013-10-09 NOTE — Progress Notes (Signed)
[facility] PROGRESS NOTE  [name] [name] TKW:[id number] DOB: [birth year] DOA: 10/03/2013 PCP: Garnet Koyanagi, DO   Brief narrative 68 year old male with history of COPD with ongoing tobacco use, hypertension, peripheral vascular disease with 3 months history of fever and cough despite multiple course of antibiotics. Patient seen by pulmonary as outpatient and had bronchoscopy done in August 2015 which showed large amount of necrotic tissue with negative cultures. Patient presented to the ED 09/6 with complaints of fever with cough and weight loss. A CT scan of the chest without contrast showed extensive infiltrative/inflammatory process in the left lung with large cavitary process with air fluid level in the left upper lung again suggestive of abscess versus necrotizing pneumonia. Patient placed on airborne precautions and sputum for AFB sent. (Previously been negative).  ID and pulmonary have been consulted and patient is on broad-spectrum antibiotic as well as IV voriconazole for suspicion of pulmonary aspergillus. Patient on admission had drop in hemoglobin to 7.7 and also with low normal blood pressure for which he was admitted to step down for closer monitoring.   Assessment/Plan: Sepsis with Necrotizing pneumonia/ ? Lung abscess - empiric IV vancomycin and Zosyn along with IV voriconazole for suspicion of Aspergillus infection.Marland Kitchen Appreciate pulmonary and ID recommendation. Plan on at least 3 weeks of antibiotics for now. PICC placed. -Sputum for AFB x3. Quanteferon gold pending. Aspergillus antibody pending. aspergillus galactoma  Fungal antibodies panel and sputum cx negative. -thoracic surgery consulted to evaluate for drainage/ lobe resection for necrotizing pneumonia/abscess but given his previous RUL lobectomy and poor lung reserve and function along with poor nutritional status, he was deemed a poor surgical candidate. -PT evaluation recommends SNF. Patient still undecided about going  home versus going to SNF. -Supportive care with Tylenol and antitussives.  Fever on 9/12  tmax of 102 with wbc of 16lk. Blood cx sent. continue current abx and supportive care. cough better with clearer sputum   Acute anemia iron deficiency anemia with increased markers of acute inflammation. Appreciate heme recommendations. continue iron supplements. received  aranexp on 9/11. Will follow up as outpt  Hypertension Holding meds it is soft and blood pressure   Chronic kidney disease stage III Appears to be at baseline. Monitor  COPD Counseled strongly on smoking cessation. Nicotine patch.  Severe protein calorie malnutrition Added supplements  Peripheral vascular disease Continue aspirin.  Generalized body ache Continue Dilaudid 2.5 mg every 4 hours .  continue Benadryl. Has severe itching with opiates  Code Status: Full code Family Communication: None at bedside Disposition Plan: Home versus skilled nursing facility on 9/14   Consultants:  ID  Pulmonary  hematology  Procedures:  None  Antibiotics:  IV vancomycin 9/6--  IV Zosyn 9/7--  IV voriconazole 9/7--  Valtrex  9/7--  HPI/Subjective: febrile with temp fo 102F. Blood cx sent. Reports feeling better. C/o left sided chest discomfort with cough.   Objective: Filed Vitals:   10/09/13 0731  BP:   Pulse:   Temp: 102 F (38.9 C)  Resp:     Intake/Output Summary (Last 24 hours) at 10/09/13 0901 Last data filed at 10/09/13 0630  Gross per 24 hour  Intake 3891.5 ml  Output   3450 ml  Net  441.5 ml   Filed Weights   10/04/13 1844 10/04/13 1849 10/08/13 0500  Weight: 55.6 kg (122 lb 9.2 oz) 55.6 kg (122 lb 9.2 oz) 56 kg (123 lb 7.3 oz)    Exam:   General:  Elderly thin built male in no  acute distress  HEENT: moist mucosa  Chest: Crackles improved over left lung , no rhonchi,  or wheeze   Cardiovascular: S1 and S2 tachycardic , no murmurs   Abdomen: Soft, nontender, non  distended  Extremities: Warm, no edema    Data Reviewed: Basic Metabolic Panel:  Recent Labs Lab 10/03/13 2026 10/04/13 0602 10/05/13 1015 10/06/13 0320 10/07/13 0345  NA 131* 133* 136* 134* 134*  K 4.1 4.2 4.1 4.3 4.2  CL 92* 95* 97 98 98  CO2 22 23 24 24 25   GLUCOSE 126* 126* 116* 136* 93  BUN 21 21 17 15 11   CREATININE 1.52* 1.53* 1.48* 1.45* 1.32  CALCIUM 9.1 8.7 9.2 8.6 8.9   Liver Function Tests:  Recent Labs Lab 10/03/13 2026 10/04/13 0602 10/05/13 1015  AST 11 13 19   ALT 7 7 8   ALKPHOS 96 81 85  BILITOT 0.5 2.0* 0.9  PROT 8.7* 7.5 7.4  ALBUMIN 2.8* 2.4* 2.3*   No results found for this basename: LIPASE, AMYLASE,  in the last 168 hours No results found for this basename: AMMONIA,  in the last 168 hours CBC:  Recent Labs Lab 10/03/13 2026 10/04/13 0602  10/04/13 1743 10/05/13 1015 10/06/13 0320 10/07/13 0345 10/09/13 0845  WBC 14.2* 15.9*  --  13.1* 18.3* 18.1* 15.9* 16.9*  NEUTROABS 10.3* 12.5*  --  8.7*  --   --   --   --   HGB 9.3* 7.7*  < > 7.3* 10.0* 9.4* 8.9* 8.5*  HCT 26.6* 22.9*  < > 21.1* 28.2* 26.9* 25.6* 25.7*  MCV 74.1* 74.6*  --  74.6* 75.2* 76.0* 76.4* 78.8  PLT 465* 404*  --  382 369 365 383 362  < > = values in this interval not displayed. Cardiac Enzymes: No results found for this basename: CKTOTAL, CKMB, CKMBINDEX, TROPONINI,  in the last 168 hours BNP (last 3 results) No results found for this basename: PROBNP,  in the last 8760 hours CBG:  Recent Labs Lab 10/04/13 0111  GLUCAP 125*    Recent Results (from the past 240 hour(s))  CULTURE, BLOOD (ROUTINE X 2)     Status: None   Collection Time    10/03/13 10:00 PM      Result Value Ref Range Status   Specimen Description BLOOD LEFT ANTECUBITAL   Final   Special Requests BOTTLES DRAWN AEROBIC AND ANAEROBIC 5CC   Final   Culture  Setup Time     Final   Value: 10/04/2013 00:26     Performed at [facility]   Culture     Final   Value:        BLOOD CULTURE  RECEIVED NO GROWTH TO DATE CULTURE WILL BE HELD FOR 5 DAYS BEFORE ISSUING A FINAL NEGATIVE REPORT     Performed at [facility]   Report Status PENDING   Incomplete  CULTURE, BLOOD (ROUTINE X 2)     Status: None   Collection Time    10/03/13 10:05 PM      Result Value Ref Range Status   Specimen Description BLOOD LEFT FOREARM   Final   Special Requests BOTTLES DRAWN AEROBIC AND ANAEROBIC 5CC   Final   Culture  Setup Time     Final   Value: 10/04/2013 00:26     Performed at [facility]   Culture     Final   Value:        BLOOD CULTURE RECEIVED NO GROWTH TO DATE  CULTURE WILL BE HELD FOR 5 DAYS BEFORE ISSUING A FINAL NEGATIVE REPORT     Performed at [facility]   Report Status PENDING   Incomplete  URINE CULTURE     Status: None   Collection Time    10/04/13 12:28 AM      Result Value Ref Range Status   Specimen Description URINE, RANDOM   Final   Special Requests NONE   Final   Culture  Setup Time     Final   Value: 10/04/2013 11:07     Performed at [place]     Final   Value: NO GROWTH     Performed at [facility]   Culture     Final   Value: NO GROWTH     Performed at [facility]   Report Status 10/05/2013 FINAL   Final  CULTURE, EXPECTORATED SPUTUM-ASSESSMENT     Status: None   Collection Time    10/04/13 12:48 AM      Result Value Ref Range Status   Specimen Description SPUTUM   Final   Special Requests Immunocompromised   Final   Sputum evaluation     Final   Value: MICROSCOPIC FINDINGS SUGGEST THAT THIS SPECIMEN IS NOT REPRESENTATIVE OF LOWER RESPIRATORY SECRETIONS. PLEASE RECOLLECT. CALLED TO RIMUNDO,J RN AT [phone] BY TIBBITTS,K   Report Status 10/04/2013 FINAL   Final  AFB CULTURE WITH SMEAR     Status: None   Collection Time    10/04/13  2:55 AM      Result Value Ref Range Status   Specimen Description SPUTUM   Final   Special Requests Normal   Final   Acid Fast Smear     Final    Value: NO ACID FAST BACILLI SEEN     Performed at [facility]   Culture     Final   Value: CULTURE WILL BE EXAMINED FOR 6 WEEKS BEFORE ISSUING A FINAL REPORT     Performed at [facility]   Report Status PENDING   Incomplete  CULTURE, EXPECTORATED SPUTUM-ASSESSMENT     Status: None   Collection Time    10/04/13  2:55 AM      Result Value Ref Range Status   Specimen Description SPUTUM   Final   Special Requests NONE   Final   Sputum evaluation     Final   Value: THIS SPECIMEN IS ACCEPTABLE. RESPIRATORY CULTURE REPORT TO FOLLOW.   Report Status 10/04/2013 FINAL   Final  CULTURE, RESPIRATORY (NON-EXPECTORATED)     Status: None   Collection Time    10/04/13  2:55 AM      Result Value Ref Range Status   Specimen Description SPUTUM   Final   Special Requests NONE   Final   Gram Stain     Final   Value: MODERATE WBC PRESENT,BOTH PMN AND MONONUCLEAR     RARE SQUAMOUS EPITHELIAL CELLS PRESENT     FEW GRAM POSITIVE COCCI     IN PAIRS IN CLUSTERS FEW GRAM NEGATIVE RODS     Performed at [facility]   Culture     Final   Value: NORMAL OROPHARYNGEAL FLORA     Performed at [facility]   Report Status 10/06/2013 FINAL   Final  FUNGUS CULTURE W SMEAR     Status: None   Collection Time    10/04/13  3:02 AM  Result Value Ref Range Status   Specimen Description SPUTUM   Final   Special Requests Normal   Final   Fungal Smear     Final   Value: NO YEAST OR FUNGAL ELEMENTS SEEN     Performed at [facility]   Culture     Final   Value: CULTURE IN PROGRESS FOR FOUR WEEKS     Performed at [facility]   Report Status PENDING   Incomplete  MRSA PCR SCREENING     Status: None   Collection Time    10/04/13  6:38 PM      Result Value Ref Range Status   MRSA by PCR NEGATIVE  NEGATIVE Final   Comment:            The GeneXpert MRSA Assay (FDA     approved for NASAL specimens     only), is one component of a     comprehensive MRSA  colonization     surveillance program. It is not     intended to diagnose MRSA     infection nor to guide or     monitor treatment for     MRSA infections.  AFB CULTURE WITH SMEAR     Status: None   Collection Time    10/05/13  5:53 AM      Result Value Ref Range Status   Specimen Description SPUTUM   Final   Special Requests Normal   Final   Acid Fast Smear     Final   Value: NO ACID FAST BACILLI SEEN     Performed at [facility]   Culture     Final   Value: CULTURE WILL BE EXAMINED FOR 6 WEEKS BEFORE ISSUING A FINAL REPORT     Performed at [facility]   Report Status PENDING   Incomplete  AFB CULTURE WITH SMEAR     Status: None   Collection Time    10/06/13  5:00 AM      Result Value Ref Range Status   Specimen Description LUNG   Final   Special Requests Normal   Final   Acid Fast Smear     Final   Value: NO ACID FAST BACILLI SEEN     Performed at [facility]   Culture     Final   Value: CULTURE WILL BE EXAMINED FOR 6 WEEKS BEFORE ISSUING A FINAL REPORT     Performed at [facility]   Report Status PENDING   Incomplete     Studies: No results found.  Scheduled Meds: . sodium chloride   Intravenous Once  . amitriptyline  50 mg Oral Daily  . antiseptic oral rinse  7 mL Mouth Rinse BID  . darbepoetin (ARANESP) injection - NON-DIALYSIS  300 mcg Subcutaneous Once  . feeding supplement (ENSURE COMPLETE)  237 mL Oral TID BM  . ferrous sulfate  325 mg Oral BID WC  . fluticasone  2 spray Each Nare Daily  . latanoprost  1 drop Both Eyes QHS  . nicotine  21 mg Transdermal Daily  . piperacillin-tazobactam (ZOSYN)  IV  3.375 g Intravenous Q8H  . polyethylene glycol  17 g Oral Daily  . sodium chloride  10-40 mL Intracatheter Q12H  . tamsulosin  0.4 mg Oral QPC supper  . valACYclovir  1,000 mg Oral Daily  . vancomycin  750 mg Intravenous BID  . voriconazole  200 mg Oral Q12H   Continuous Infusions: .  sodium chloride 100 mL/hr at 10/09/13  3729      Time spent: 25 minutes    Louellen Molder  [facility] Pager [phone] If 7PM-7AM, please contact night-coverage at [email or web address], password [facility] 10/09/2013, 9:01 AM  LOS: 6 days

## 2013-10-09 NOTE — Progress Notes (Addendum)
PT Cancellation Note  Patient Details Name: [name] [name] MRN: [id number] DOB: 1945/10/04   Cancelled Treatment:    Reason Eval/Treat Not Completed: Other (comment) (attempted PT tx session. Pt declined to participate-not feeling well-pt reports having fever. Requested PT check back another time. )   Weston Anna, MPT Pager: [phone]

## 2013-10-10 LAB — BASIC METABOLIC PANEL
Anion gap: 15 (ref 5–15)
BUN: 9 mg/dL (ref 6–23)
CALCIUM: 9.1 mg/dL (ref 8.4–10.5)
CO2: 24 mEq/L (ref 19–32)
CREATININE: 1.35 mg/dL (ref 0.50–1.35)
Chloride: 98 mEq/L (ref 96–112)
GFR calc Af Amer: 62 mL/min — ABNORMAL LOW (ref >90)
GFR, EST NON AFRICAN AMERICAN: 54 mL/min — AB (ref >90)
GLUCOSE: 106 mg/dL — AB (ref 70–99)
Potassium: 3.8 mEq/L (ref 3.7–5.3)
Sodium: 137 mEq/L (ref 137–147)

## 2013-10-10 LAB — CULTURE, BLOOD (ROUTINE X 2)
CULTURE: NO GROWTH
Culture: NO GROWTH

## 2013-10-10 LAB — CBC
HCT: 24.9 % — ABNORMAL LOW (ref 39.0–52.0)
HEMOGLOBIN: 8.3 g/dL — AB (ref 13.0–17.0)
MCH: 26.4 pg (ref 26.0–34.0)
MCHC: 33.3 g/dL (ref 30.0–36.0)
MCV: 79.3 fL (ref 78.0–100.0)
PLATELETS: 436 10*3/uL — AB (ref 150–400)
RBC: 3.14 MIL/uL — ABNORMAL LOW (ref 4.22–5.81)
RDW: 17.9 % — ABNORMAL HIGH (ref 11.5–15.5)
WBC: 16.5 10*3/uL — ABNORMAL HIGH (ref 4.0–10.5)

## 2013-10-10 LAB — VANCOMYCIN, TROUGH: Vancomycin Tr: 24.9 ug/mL — ABNORMAL HIGH (ref 10.0–20.0)

## 2013-10-10 NOTE — Progress Notes (Signed)
PHARMACY - VANCOMYCIN  Day 8/21 Vancomycin for lung mass (see full note earlier today).  Currently on regimen of Vancomycin 750mg  IV q12h.  Last dose of Vancomycin given 9/13 @ 11:13  Goal Vancomycin Trough level = 15-20 mcg/ml  Vancomycin trough level = 24.9 mcg/ml  Assessment:  Vancomycin level remains elevated despite dose reduction on 10/08/13  PLAN:  Hold Vancomycin until level falls within desired range              Check random Vancomycin level on 9/14 @ 09:30  Leone Haven, PharmD 10/10/13 @ 22:26

## 2013-10-10 NOTE — Progress Notes (Signed)
ANTIBIOTIC CONSULT NOTE - Follow up  Pharmacy Consult for Vancomycin; renally adjust abx Indication: pneumonia  Allergies  Allergen Reactions  . Morphine And Related Itching    Head to toe  . Chantix [Varenicline] Other (See Comments)    Dreams, itching, suicidal ideation  . Hydromorphone Hcl Itching    Tolerates Hydrocodone-Acetaminophen  . Ibuprofen Nausea And Vomiting  . Omnipaque [Iohexol]      Code: HIVES, Desc: FACIAL HIVES S/P IV CONTRAST.Marland KitchenMarland KitchenOK W/ 25MG  BENADRYL//A.C., Onset Date: [id number]   . 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 Now with severe left-sided necrotizing PNA, slowly worsening over the last several months. Negative interferon gamma release assay for latent TB in July and AFB Cx negative on bronchoscopy in August, only positive sputum Cx has been M. Gordonae (nonpathogenic). However, transbronchial Bx noted to have fungi on 8/20. Pharmacy consulted to dose vancomycin for PNA; Zosyn and Voriconazole per MD.  [name] [name]  9/6 >> vancomycin >> 9/6 >> cefepime x1 ED 9/7 >> Zosyn (MD) >> 9/7 >> voriconazole (MD) >>  PTA Valtrex resumed >>  Labs / vitals Tmax: 102.6, continuing to spike low-grade fevers WBC: up to 16.9, slowly trending up Renal: CKD3, SCr improved to 1.32 (1.48 on admit, baseline ~1.5); CrCl ~ 44 ml/min CG, 57 ml/min N  Microbiology 9/7 sputum cx: normal flora (few GPCs; GNR), final 9/6 blood x 2: NGTD 9/12 blood x 2: sent 9/7 urine: NGF 9/6 Quant TB gold: IP 9/7 AFB Cx #1 smear negative; IP x 6 weeks 9/8 AFB Cx #2: smear negative; IP x 6 weeks 9/9 AFB Cx #3: smear negative; IP x 6 weeks 9/7 fungus cx with smear: no organisms seen on smear; IP x 4 weeks 9/7 aspergillus galactomannan Ag: not detected 9/8 aspergillus galactomannan Ag: not detected 9/7 aspergillus Ab:  1:32, indicative of current or recent infxn 9/7 fungal Ab: IP 9/7 HIV Ab: nonreactive 9/7 strep ur ag: neg 9/7 legionella ur ag: neg 9/7 MRSA PCR: neg 9/8 Hep B panel: neg 9/8 Cold agglutinin titer: none detected  Drug level / dose changes info: 9/9 vanc trough = 7.6 @ 2047 on 750mg  q24h, changed to 1g IV q12h 9/11 vanc trough = 21.6 @ 0900 on 1g q12h, changed to 750 mg IV q12 **RN delivered 75% of 9/11 AM dose to give 750 mg total >> 9/11 AM dose = 1st dose of new regimen**  9/13: D"8"/21 vancomycin now 750mg  q12h, D7 ZEI, D7 voriconazole for lung abscess. Really did not have enough vanc on board until 9/10 or 9/11. ID wants 21 days of total antibiotics  Goal of Therapy:  Vancomycin trough level 15-20 mcg/ml Other antibiotics per indication and renal function  Plan:  - continue vancomycin 750mg  IV q12h - vancomycin trough tonight at 2100 - continue Zosyn 3.375G IV q8h, each dose to be infused over 4 hours; voriconazole 200mg  PO BID   - noted positive aspergillus antibodies per CF titer, voriconazole 200mg  PO BID remains appropriate for treatment for this patient with renal dysfunction - follow-up clinical course, culture results, renal function - follow-up antibiotic de-escalation and length of therapy  Thank you for the consult.  Currie Paris, PharmD, BCPS Pager: [phone] Pharmacy: [phone] 10/10/2013 11:24 AM

## 2013-10-10 NOTE — Progress Notes (Signed)
CSW met with patient today to discuss SNF recommendation and bed offers. Patient stated " I'm going home!"  CSW reminded patient of probable need for long term IV antibiotics and that home health would have to teach someone to do the antibiotics.  Patient related that he did not feel that he could administer them himself and admitted that he did not have anyone to be taught.  He became frustrated with CSW when told that [facility] would not be able to provide daily, long term administration of the IV antibiotics.  Patient finally stated that he would "consider" [facility] as a possible option but he still plans to return home.  CSW stated that co-worker would follow up with him tomorrow.  Upon leaving the room he stated "I've already told you I'm going home."  CSW provided reassurance and validated his concerns but asked him to keep an open mind about short term SNF for his antibiotics.  Lorie Phenix. Pauline Good, [place]

## 2013-10-10 NOTE — Progress Notes (Signed)
[facility] PROGRESS NOTE  Avontae Burkhead VQM:[id number] DOB: 10/23/1945 DOA: 10/03/2013 PCP: Garnet Koyanagi, DO   Brief narrative 68 year old male with history of COPD with ongoing tobacco use, hypertension, peripheral vascular disease with 3 months history of fever and cough despite multiple course of antibiotics. Patient seen by pulmonary as outpatient and had bronchoscopy done in August 2015 which showed large amount of necrotic tissue with negative cultures. Patient presented to the ED 09/6 with complaints of fever with cough and weight loss. A CT scan of the chest without contrast showed extensive infiltrative/inflammatory process in the left lung with large cavitary process with air fluid level in the left upper lung again suggestive of abscess versus necrotizing pneumonia. Patient placed on airborne precautions and sputum for AFB sent. (Previously been negative).  ID and pulmonary have been consulted and patient is on broad-spectrum antibiotic as well as IV voriconazole for suspicion of pulmonary aspergillus. Patient on admission had drop in hemoglobin to 7.7 and also with low normal blood pressure for which he was admitted to step down for closer monitoring.   Assessment/Plan: Sepsis with Necrotizing pneumonia/ ? Lung abscess - empiric IV vancomycin and Zosyn along with IV voriconazole for suspicion of Aspergillus infection.Marland Kitchen Appreciate pulmonary and ID recommendation. Plan on at least 3 weeks of antibiotics for now. PICC placed. -Sputum for AFB x3. Quanteferon gold pending. Aspergillus antibody positive. aspergillus galactoma  Fungal antibodies panel and sputum cx negative. -thoracic surgery consulted to evaluate for drainage/ lobe resection for necrotizing pneumonia/abscess but given his previous RUL lobectomy and poor lung reserve and function along with poor nutritional status, he was deemed a poor surgical candidate. -PT evaluation recommends SNF. Patient still undecided about going  home versus going to SNF. -Patient continued to have temperature spikes last 24 hours. Continue supportive care with Tylenol ( required aspirin yesterday as fever did not improve with tylenol). Cough is better and sputum  Clearing. If he has persistent fever will discuss again with ID and pulmonary. Follow blood cultures sent on 9/12. -WBC in a.m.     Acute anemia iron deficiency anemia with increased markers of acute inflammation. Appreciate heme recommendations. -Added iron supplements. received  aranexp on 9/11. Will follow up as outpt  Hypertension Resume home blood pressure medications.   Chronic kidney disease stage III Appears to be at baseline. Recheck in a.m.  COPD Counseled strongly on smoking cessation. Nicotine patch.  Severe protein calorie malnutrition Added supplements  Peripheral vascular disease Continue aspirin.  Generalized body ache Continue Dilaudid 2.5 mg every 4 hours .  continue Benadryl. Has severe itching with opiates  Code Status: Full code Family Communication: None at bedside Disposition Plan: Home versus skilled nursing facility on 9/14   Consultants:  ID  Pulmonary  hematology  Procedures:  None  Antibiotics:  IV vancomycin 9/6--  IV Zosyn 9/7--  IV voriconazole 9/7--  Valtrex  9/7--  HPI/Subjective: had temperature spikes all day yesterday with MAXIMUM TEMPERATURE of 102.67F. Reports cough to be better.   Objective: Filed Vitals:   10/10/13 0616  BP: 148/93  Pulse: 93  Temp: 98.5 F (36.9 C)  Resp: 20    Intake/Output Summary (Last 24 hours) at 10/10/13 1149 Last data filed at 10/10/13 0700  Gross per 24 hour  Intake   3240 ml  Output   1875 ml  Net   1365 ml   Filed Weights   10/04/13 1844 10/04/13 1849 10/08/13 0500  Weight: 55.6 kg (122 lb 9.2 oz) 55.6 kg (122  lb 9.2 oz) 56 kg (123 lb 7.3 oz)    Exam:   General: no acute distress  Chest: Crackles improved over left lung , no rhonchi,  or wheeze    Cardiovascular: S1 and S2 normal , no murmurs   Abdomen: Soft, nontender, non distended  Extremities: Warm, no edema    Data Reviewed: Basic Metabolic Panel:  Recent Labs Lab 10/03/13 2026 10/04/13 0602 10/05/13 1015 10/06/13 0320 10/07/13 0345  NA 131* 133* 136* 134* 134*  K 4.1 4.2 4.1 4.3 4.2  CL 92* 95* 97 98 98  CO2 22 23 24 24 25   GLUCOSE 126* 126* 116* 136* 93  BUN 21 21 17 15 11   CREATININE 1.52* 1.53* 1.48* 1.45* 1.32  CALCIUM 9.1 8.7 9.2 8.6 8.9   Liver Function Tests:  Recent Labs Lab 10/03/13 2026 10/04/13 0602 10/05/13 1015  AST 11 13 19   ALT 7 7 8   ALKPHOS 96 81 85  BILITOT 0.5 2.0* 0.9  PROT 8.7* 7.5 7.4  ALBUMIN 2.8* 2.4* 2.3*   No results found for this basename: LIPASE, AMYLASE,  in the last 168 hours No results found for this basename: AMMONIA,  in the last 168 hours CBC:  Recent Labs Lab 10/03/13 2026 10/04/13 0602  10/04/13 1743 10/05/13 1015 10/06/13 0320 10/07/13 0345 10/09/13 0845  WBC 14.2* 15.9*  --  13.1* 18.3* 18.1* 15.9* 16.9*  NEUTROABS 10.3* 12.5*  --  8.7*  --   --   --   --   HGB 9.3* 7.7*  < > 7.3* 10.0* 9.4* 8.9* 8.5*  HCT 26.6* 22.9*  < > 21.1* 28.2* 26.9* 25.6* 25.7*  MCV 74.1* 74.6*  --  74.6* 75.2* 76.0* 76.4* 78.8  PLT 465* 404*  --  382 369 365 383 362  < > = values in this interval not displayed. Cardiac Enzymes: No results found for this basename: CKTOTAL, CKMB, CKMBINDEX, TROPONINI,  in the last 168 hours BNP (last 3 results) No results found for this basename: PROBNP,  in the last 8760 hours CBG:  Recent Labs Lab 10/04/13 0111  GLUCAP 125*    Recent Results (from the past 240 hour(s))  CULTURE, BLOOD (ROUTINE X 2)     Status: None   Collection Time    10/03/13 10:00 PM      Result Value Ref Range Status   Specimen Description BLOOD LEFT ANTECUBITAL   Final   Special Requests BOTTLES DRAWN AEROBIC AND ANAEROBIC 5CC   Final   Culture  Setup Time     Final   Value: 10/04/2013 00:26      Performed at [facility]   Culture     Final   Value:        BLOOD CULTURE RECEIVED NO GROWTH TO DATE CULTURE WILL BE HELD FOR 5 DAYS BEFORE ISSUING A FINAL NEGATIVE REPORT     Performed at [facility]   Report Status PENDING   Incomplete  CULTURE, BLOOD (ROUTINE X 2)     Status: None   Collection Time    10/03/13 10:05 PM      Result Value Ref Range Status   Specimen Description BLOOD LEFT FOREARM   Final   Special Requests BOTTLES DRAWN AEROBIC AND ANAEROBIC 5CC   Final   Culture  Setup Time     Final   Value: 10/04/2013 00:26     Performed at [facility]   Culture     Final   Value:  BLOOD CULTURE RECEIVED NO GROWTH TO DATE CULTURE WILL BE HELD FOR 5 DAYS BEFORE ISSUING A FINAL NEGATIVE REPORT     Performed at [facility]   Report Status PENDING   Incomplete  URINE CULTURE     Status: None   Collection Time    10/04/13 12:28 AM      Result Value Ref Range Status   Specimen Description URINE, RANDOM   Final   Special Requests NONE   Final   Culture  Setup Time     Final   Value: 10/04/2013 11:07     Performed at [facility]     Final   Value: NO GROWTH     Performed at [facility]   Culture     Final   Value: NO GROWTH     Performed at [facility]   Report Status 10/05/2013 FINAL   Final  CULTURE, EXPECTORATED SPUTUM-ASSESSMENT     Status: None   Collection Time    10/04/13 12:48 AM      Result Value Ref Range Status   Specimen Description SPUTUM   Final   Special Requests Immunocompromised   Final   Sputum evaluation     Final   Value: MICROSCOPIC FINDINGS SUGGEST THAT THIS SPECIMEN IS NOT REPRESENTATIVE OF LOWER RESPIRATORY SECRETIONS. PLEASE RECOLLECT. CALLED TO RIMUNDO,J RN AT [phone] BY TIBBITTS,K   Report Status 10/04/2013 FINAL   Final  AFB CULTURE WITH SMEAR     Status: None   Collection Time    10/04/13  2:55 AM      Result Value Ref Range Status   Specimen Description  SPUTUM   Final   Special Requests Normal   Final   Acid Fast Smear     Final   Value: NO ACID FAST BACILLI SEEN     Performed at [facility]   Culture     Final   Value: CULTURE WILL BE EXAMINED FOR 6 WEEKS BEFORE ISSUING A FINAL REPORT     Performed at [facility]   Report Status PENDING   Incomplete  CULTURE, EXPECTORATED SPUTUM-ASSESSMENT     Status: None   Collection Time    10/04/13  2:55 AM      Result Value Ref Range Status   Specimen Description SPUTUM   Final   Special Requests NONE   Final   Sputum evaluation     Final   Value: THIS SPECIMEN IS ACCEPTABLE. RESPIRATORY CULTURE REPORT TO FOLLOW.   Report Status 10/04/2013 FINAL   Final  CULTURE, RESPIRATORY (NON-EXPECTORATED)     Status: None   Collection Time    10/04/13  2:55 AM      Result Value Ref Range Status   Specimen Description SPUTUM   Final   Special Requests NONE   Final   Gram Stain     Final   Value: MODERATE WBC PRESENT,BOTH PMN AND MONONUCLEAR     RARE SQUAMOUS EPITHELIAL CELLS PRESENT     FEW GRAM POSITIVE COCCI     IN PAIRS IN CLUSTERS FEW GRAM NEGATIVE RODS     Performed at [facility]   Culture     Final   Value: NORMAL OROPHARYNGEAL FLORA     Performed at [facility]   Report Status 10/06/2013 FINAL   Final  FUNGUS CULTURE W SMEAR     Status: None   Collection Time    10/04/13  3:02 AM      Result Value Ref Range Status   Specimen Description SPUTUM   Final   Special Requests Normal   Final   Fungal Smear     Final   Value: NO YEAST OR FUNGAL ELEMENTS SEEN     Performed at [facility]   Culture     Final   Value: CULTURE IN PROGRESS FOR FOUR WEEKS     Performed at [facility]   Report Status PENDING   Incomplete  MRSA PCR SCREENING     Status: None   Collection Time    10/04/13  6:38 PM      Result Value Ref Range Status   MRSA by PCR NEGATIVE  NEGATIVE Final   Comment:            The GeneXpert MRSA Assay (FDA     approved for  NASAL specimens     only), is one component of a     comprehensive MRSA colonization     surveillance program. It is not     intended to diagnose MRSA     infection nor to guide or     monitor treatment for     MRSA infections.  AFB CULTURE WITH SMEAR     Status: None   Collection Time    10/05/13  5:53 AM      Result Value Ref Range Status   Specimen Description SPUTUM   Final   Special Requests Normal   Final   Acid Fast Smear     Final   Value: NO ACID FAST BACILLI SEEN     Performed at [facility]   Culture     Final   Value: CULTURE WILL BE EXAMINED FOR 6 WEEKS BEFORE ISSUING A FINAL REPORT     Performed at [facility]   Report Status PENDING   Incomplete  AFB CULTURE WITH SMEAR     Status: None   Collection Time    10/06/13  5:00 AM      Result Value Ref Range Status   Specimen Description LUNG   Final   Special Requests Normal   Final   Acid Fast Smear     Final   Value: NO ACID FAST BACILLI SEEN     Performed at [facility]   Culture     Final   Value: CULTURE WILL BE EXAMINED FOR 6 WEEKS BEFORE ISSUING A FINAL REPORT     Performed at [facility]   Report Status PENDING   Incomplete     Studies: No results found.  Scheduled Meds: . sodium chloride   Intravenous Once  . amitriptyline  50 mg Oral Daily  . antiseptic oral rinse  7 mL Mouth Rinse BID  . feeding supplement (ENSURE COMPLETE)  237 mL Oral TID BM  . ferrous sulfate  325 mg Oral BID WC  . fluticasone  2 spray Each Nare Daily  . latanoprost  1 drop Both Eyes QHS  . nicotine  21 mg Transdermal Daily  . piperacillin-tazobactam (ZOSYN)  IV  3.375 g Intravenous Q8H  . polyethylene glycol  17 g Oral Daily  . sodium chloride  10-40 mL Intracatheter Q12H  . tamsulosin  0.4 mg Oral QPC supper  . valACYclovir  1,000 mg Oral Daily  . vancomycin  750 mg Intravenous BID  . voriconazole  200 mg Oral Q12H   Continuous Infusions: . sodium chloride 100 mL/hr at  10/10/13 0600       Time spent: 25 minutes    Louellen Molder  [facility] Pager [phone] If 7PM-7AM, please contact night-coverage at [email or web address], password [facility] 10/10/2013, 11:49 AM  LOS: 7 days

## 2013-10-11 DIAGNOSIS — R509 Fever, unspecified: Secondary | ICD-10-CM

## 2013-10-11 LAB — CBC
HCT: 22.8 % — ABNORMAL LOW (ref 39.0–52.0)
Hemoglobin: 7.9 g/dL — ABNORMAL LOW (ref 13.0–17.0)
MCH: 26.9 pg (ref 26.0–34.0)
MCHC: 34.6 g/dL (ref 30.0–36.0)
MCV: 77.6 fL — AB (ref 78.0–100.0)
Platelets: 490 10*3/uL — ABNORMAL HIGH (ref 150–400)
RBC: 2.94 MIL/uL — ABNORMAL LOW (ref 4.22–5.81)
RDW: 18.4 % — AB (ref 11.5–15.5)
WBC: 15.6 10*3/uL — AB (ref 4.0–10.5)

## 2013-10-11 LAB — FUNGAL ANTIBODIES PANEL, ID-BLOOD
ASPERGILLUS NIGER ANTIBODIES: NEGATIVE
Aspergillus Flavus Antibodies: NEGATIVE
Aspergillus fumigatus: POSITIVE
Blastomyces Abs, Qn, DID: NEGATIVE
Coccidioides Antibody ID: NEGATIVE
HISTOPLASMA AB ID: NEGATIVE

## 2013-10-11 LAB — BASIC METABOLIC PANEL
ANION GAP: 13 (ref 5–15)
BUN: 8 mg/dL (ref 6–23)
CO2: 25 mEq/L (ref 19–32)
CREATININE: 1.47 mg/dL — AB (ref 0.50–1.35)
Calcium: 8.7 mg/dL (ref 8.4–10.5)
Chloride: 101 mEq/L (ref 96–112)
GFR, EST AFRICAN AMERICAN: 56 mL/min — AB (ref >90)
GFR, EST NON AFRICAN AMERICAN: 48 mL/min — AB (ref >90)
Glucose, Bld: 95 mg/dL (ref 70–99)
POTASSIUM: 3.7 meq/L (ref 3.7–5.3)
Sodium: 139 mEq/L (ref 137–147)

## 2013-10-11 LAB — VANCOMYCIN, RANDOM: Vancomycin Rm: 14.3 ug/mL

## 2013-10-11 MED ORDER — GUAIFENESIN-CODEINE 100-10 MG/5ML PO SOLN
5.0000 mL | Freq: Four times a day (QID) | ORAL | Status: DC | PRN
  Administered 2013-10-11 – 2013-10-12 (×3): 5 mL via ORAL
  Filled 2013-10-11 (×3): qty 5

## 2013-10-11 MED ORDER — VANCOMYCIN HCL IN DEXTROSE 1-5 GM/200ML-% IV SOLN
1000.0000 mg | INTRAVENOUS | Status: DC
  Administered 2013-10-11: 1000 mg via INTRAVENOUS
  Filled 2013-10-11 (×2): qty 200

## 2013-10-11 NOTE — Care Management Note (Signed)
CARE MANAGEMENT NOTE 10/11/2013  Patient:  [name] [name],[name] [name]   Account Number:  [id number]  Date Initiated:  10/05/2013  Documentation initiated by:  DAVIS,RHONDA  Subjective/Objective Assessment:   pt with lung ca and lung abcess     Action/Plan:   home when stable   Anticipated DC Date:  10/13/2013   Anticipated DC Plan:  SKILLED NURSING FACILITY  In-house referral  Clinical Social Worker      DC Planning Services  CM consult      [facility] Choice  NA   Choice offered to / List presented to:  NA      DME agency  NA     [place] arranged  NA      [place] agency  NA   Status of service:  In process, will continue to follow Medicare Important Message given?   (If response is "NO", the following Medicare IM given date fields will be blank) Date Medicare IM given:   Medicare IM given by:   Date Additional Medicare IM given:   Additional Medicare IM given by:    Discharge Disposition:    Per UR Regulation:  Reviewed for med. necessity/level of care/duration of stay  If discussed at Long Length of Stay Meetings, dates discussed:    Comments:  10/11/13 Marney Doctor RN,BSN,NCM Was called by CSW to speak to pt about HH.  Informed pt that if he went home on IV abx that he would need to be taught how to do it himself or a family/friend would need to be taught and the [facility] agency would support them.  I also told the patient that the MD notes are saying he will potentially need IV ABX for 2 more weeks.  Pt stated to just "set him up to go to that place on [address]" [facility] [place]) I called CSW to inform that the pt does want SNF after our conversation.  10/11/13 Marney Doctor RN,BSN,NCM See CSW note.  Probable SNF for long term abx.  CM will continue to follow.  Rhonda Davis,RN,BSN,CCM [id number]/RLD/patient transferring out of scu to med surg floor/lung abcess present on films wbc 15.9/iv's and iv abx ongoing/afb smears neg for tb/Aspergillus antigen neg x2/off resp isoplation as of  [id number]/Ceruplasmin is elevated_confusion??? [name] diease?????/csw looking for snf placement.

## 2013-10-11 NOTE — Progress Notes (Signed)
Physical Therapy Treatment Patient Details Name: [name] [name] MRN: [id number] DOB: May 12, 1945 Today's Date: 10/11/2013    History of Present Illness Pt arrives by EMS on 10/03/13 with complaints fever for the past 2 weeks-EMS states he was diagnosed with a fungal infection in his lungs 2 weeks ago     PT Comments    Patient  Expresses that he does not need PT. Patient reports he is up  In room and hall ad lib, pushing IV pole today. RN confirmed. PT will sign off per patient desire and  , declines to use RE, reports he has a cane  At home. RN notified of PT plan.  Follow Up Recommendations        Equipment Recommendations  None recommended by PT    Recommendations for Other Services       Precautions / Restrictions Precautions Precautions: Fall    Mobility  Bed Mobility Overal bed mobility: Independent                Transfers Overall transfer level: Modified independent Equipment used: None Transfers: Sit to/from Stand Sit to Stand: Independent            Ambulation/Gait Ambulation/Gait assistance: Modified independent (Device/Increase time) Ambulation Distance (Feet): 200 Feet   Gait Pattern/deviations: WFL(Within Functional Limits)     General Gait Details: Pt declined to use RW, used IV pole   Stairs            Wheelchair Mobility    Modified Rankin (Stroke Patients Only)       Balance                                    Cognition Arousal/Alertness: Awake/alert Behavior During Therapy: Flat affect                        Exercises      General Comments        Pertinent Vitals/Pain Pain Score: 7  Pain Location: L ankle Pain Descriptors / Indicators: Discomfort    Home Living                      Prior Function            PT Goals (current goals can now be found in the care plan section) Progress towards PT goals: Goals met/education completed, patient discharged from PT     Frequency       PT Plan Discharge plan needs to be updated (PT discontinuing  per patient request .)    Co-evaluation             End of Session   Activity Tolerance: Patient tolerated treatment well Patient left:  (up in room ad libe with IV pole)     Time: 7903-8333 PT Time Calculation (min): 22 min  Charges:  $Gait Training: 8-22 mins                    G Codes:      Claretha Cooper 10/11/2013, 11:56 AM Tresa Endo PT [phone]

## 2013-10-11 NOTE — Progress Notes (Signed)
ANTIBIOTIC CONSULT NOTE - Follow up  Pharmacy Consult for Vancomycin; renally adjust abx Indication: pneumonia  Allergies  Allergen Reactions  . Morphine And Related Itching    Head to toe  . Chantix [Varenicline] Other (See Comments)    Dreams, itching, suicidal ideation  . Hydromorphone Hcl Itching    Tolerates Hydrocodone-Acetaminophen  . Ibuprofen Nausea And Vomiting  . Omnipaque [Iohexol]      Code: HIVES, Desc: FACIAL HIVES S/P IV CONTRAST.Marland KitchenMarland KitchenOK W/ 25MG  BENADRYL//A.C., Onset Date: [id number]   . Shellfish Allergy Swelling   Patient Measurements: Height: 5\' 7"  (170.2 cm) Weight: 123 lb 7.3 oz (56 kg) IBW/kg (Calculated) : 66.1  Assessment: 68 yo M smoker with Hx R upper lobectomy (NSCLC 08). Now with severe L-sided necrotizing PNA, slowly worsening over last several months. Negative interferon gamma release assay for latent TB in July, AFB Cx negative on bronchoscopy in August, only positive sputum Cx is M. Gordonae (nonpathogenic). 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 Pharmacy consulted to dose vancomycin for PNA; Zosyn and Voriconazole per MD.  [employer]  9/6 >> vancomycin >> 9/6 >> cefepime x1 ED 9/7 >> Zosyn (MD) >> 9/7 >> voriconazole (MD) >>  PTA Valtrex resumed >>  Labs / vitals Tmax: 100.6 last 24hrs, continuing to spike low-grade fevers WBC: remain elevated Renal: CKD3, SCr up slightly, near baseline(1.48 on admit, baseline ~1.5); CrCl ~40 ml/min CG, 50 ml/min N  Microbiology 9/7 sputum cx: normal flora (few GPCs; GNR), final 9/6 BCx x 2: NGF 9/12 BCx x 2: ngtd 9/7 UCx: NGF 9/7 Sputum: NGF 9/6 Quant TB gold: IP 9/7 AFB Cx #1 smear negative; IP x 6 weeks 9/8 AFB Cx #2: smear negative; IP x 6 weeks 9/9 AFB Cx #3: smear negative; IP x 6 weeks 9/7 fungus cx w smear: no organisms seen on smear; IP x 4 weeks 9/7 aspergillus galactomannan Ag: not detected 9/8 aspergillus galactomannan Ag: not detected 9/7 aspergillus Ab: 1:32, indicative of  current or recent infxn 9/7 fungal Ab: IP 9/7 HIV Ab: nonreactive 9/7 strep and Legionella ur ag: neg 9/7 MRSA PCR: neg 9/8 Hep B panel: neg 9/8 Cold agglutinin titer: none detected  Drug level / dose changes info: 9/9 VT = 7.6, changed to 1g IV q12h 9/11 VT = 21.6, on 1g q12h. Change to 750mg  IV q12 9/13 VT @ 2100 = 24.9 on 750mg  q12h, Vanc doses d/c'ed. Recheck VT in AM 9/14 Random Vanc @ 0930 = 14.3, t1/2 ~16hrs.  9/14: D9/21 Vanc now 1g q24h, D8 Zosyn EI, D8 voriconazole for lung abscess.  Goal of Therapy:  Vancomycin trough level 15-20 mcg/ml Appropriate antibiotic dosing for renal function; eradication of infection  Plan:  - Resume Vancomycin using 1g IV q24h. - Cont same Zosyn and voriconazole.   - noted positive aspergillus antibodies per CF titer, voriconazole 200mg  PO BID remains appropriate for treatment for this patient with renal dysfunction. - follow-up clinical course, culture results, renal function. - follow-up antibiotic de-escalation and length of therapy.  Romeo Rabon, PharmD, pager [phone]. 10/11/2013,11:10 AM.

## 2013-10-11 NOTE — Progress Notes (Signed)
Patient ID: [name] [name], male   DOB: [birth year], 68 y.o.   MRN: [id number]         [facility]    Date of Admission:  10/03/2013           Day 9 vancomycin        Day 9 piperacillin tazobactam        Day 9 voriconazole Active Problems:   Hypertension   Peripheral vascular disease   Glaucoma   Smoker   Necrotizing pneumonia   Hospital acquired PNA   Protein-calorie malnutrition, severe   Microcytic anemia   CKD (chronic kidney disease) stage 3, GFR 30-59 ml/min   History of lung abscess   Acute hemolytic anemia   Sepsis   Anemia, iron deficiency   . sodium chloride   Intravenous Once  . amitriptyline  50 mg Oral Daily  . antiseptic oral rinse  7 mL Mouth Rinse BID  . feeding supplement (ENSURE COMPLETE)  237 mL Oral TID BM  . ferrous sulfate  325 mg Oral BID WC  . fluticasone  2 spray Each Nare Daily  . latanoprost  1 drop Both Eyes QHS  . nicotine  21 mg Transdermal Daily  . piperacillin-tazobactam (ZOSYN)  IV  3.375 g Intravenous Q8H  . polyethylene glycol  17 g Oral Daily  . sodium chloride  10-40 mL Intracatheter Q12H  . tamsulosin  0.4 mg Oral QPC supper  . valACYclovir  1,000 mg Oral Daily  . vancomycin  1,000 mg Intravenous Q24H  . voriconazole  200 mg Oral Q12H    Subjective: [name] [name] is feeling a little bit better. His cough has improved and he is not bringing up as much sputum.  Objective: Temp:  [98.9 F (37.2 C)-100.6 F (38.1 C)] 98.9 F (37.2 C) (09/14 1318) Pulse Rate:  [96-105] 101 (09/14 1318) Resp:  [16-18] 18 (09/14 1318) BP: (104-131)/(60-65) 117/60 mmHg (09/14 1318) SpO2:  [92 %-99 %] 95 % (09/14 1318)  General: He is in no distress, watching television Lungs: Diminished breath sounds and scattered crackles in left lung Cor: Regular S1 and S2 with no murmurs  Lab Results Lab Results  Component Value Date   WBC 15.6* 10/11/2013   HGB 7.9* 10/11/2013   HCT 22.8* 10/11/2013   MCV 77.6* 10/11/2013   PLT 490*  10/11/2013    Lab Results  Component Value Date   CREATININE 1.47* 10/11/2013   BUN 8 10/11/2013   NA 139 10/11/2013   K 3.7 10/11/2013   CL 101 10/11/2013   CO2 25 10/11/2013    Lab Results  Component Value Date   ALT 8 10/05/2013   AST 19 10/05/2013   ALKPHOS 85 10/05/2013   BILITOT 0.9 10/05/2013    Aspergillus fumigatus antibody: Positive   Microbiology: Recent Results (from the past 240 hour(s))  CULTURE, BLOOD (ROUTINE X 2)     Status: None   Collection Time    10/03/13 10:00 PM      Result Value Ref Range Status   Specimen Description BLOOD LEFT ANTECUBITAL   Final   Special Requests BOTTLES DRAWN AEROBIC AND ANAEROBIC 5CC   Final   Culture  Setup Time     Final   Value: 10/04/2013 00:26     Performed at [facility]     Final   Value: NO GROWTH 5 DAYS     Performed at [facility]   Report Status 10/10/2013 FINAL  Final  CULTURE, BLOOD (ROUTINE X 2)     Status: None   Collection Time    10/03/13 10:05 PM      Result Value Ref Range Status   Specimen Description BLOOD LEFT FOREARM   Final   Special Requests BOTTLES DRAWN AEROBIC AND ANAEROBIC 5CC   Final   Culture  Setup Time     Final   Value: 10/04/2013 00:26     Performed at [facility]   Culture     Final   Value: NO GROWTH 5 DAYS     Performed at [facility]   Report Status 10/10/2013 FINAL   Final  URINE CULTURE     Status: None   Collection Time    10/04/13 12:28 AM      Result Value Ref Range Status   Specimen Description URINE, RANDOM   Final   Special Requests NONE   Final   Culture  Setup Time     Final   Value: 10/04/2013 11:07     Performed at [facility]     Final   Value: NO GROWTH     Performed at [facility]   Culture     Final   Value: NO GROWTH     Performed at [facility]   Report Status 10/05/2013 FINAL   Final  CULTURE, EXPECTORATED SPUTUM-ASSESSMENT     Status: None   Collection Time    10/04/13 12:48  AM      Result Value Ref Range Status   Specimen Description SPUTUM   Final   Special Requests Immunocompromised   Final   Sputum evaluation     Final   Value: MICROSCOPIC FINDINGS SUGGEST THAT THIS SPECIMEN IS NOT REPRESENTATIVE OF LOWER RESPIRATORY SECRETIONS. PLEASE RECOLLECT. CALLED TO RIMUNDO,J RN AT [phone] 09.07.15 BY TIBBITTS,K   Report Status 10/04/2013 FINAL   Final  AFB CULTURE WITH SMEAR     Status: None   Collection Time    10/04/13  2:55 AM      Result Value Ref Range Status   Specimen Description SPUTUM   Final   Special Requests Normal   Final   Acid Fast Smear     Final   Value: NO ACID FAST BACILLI SEEN     Performed at [facility]   Culture     Final   Value: CULTURE WILL BE EXAMINED FOR 6 WEEKS BEFORE ISSUING A FINAL REPORT     Performed at [facility]   Report Status PENDING   Incomplete  CULTURE, EXPECTORATED SPUTUM-ASSESSMENT     Status: None   Collection Time    10/04/13  2:55 AM      Result Value Ref Range Status   Specimen Description SPUTUM   Final   Special Requests NONE   Final   Sputum evaluation     Final   Value: THIS SPECIMEN IS ACCEPTABLE. RESPIRATORY CULTURE REPORT TO FOLLOW.   Report Status 10/04/2013 FINAL   Final  CULTURE, RESPIRATORY (NON-EXPECTORATED)     Status: None   Collection Time    10/04/13  2:55 AM      Result Value Ref Range Status   Specimen Description SPUTUM   Final   Special Requests NONE   Final   Gram Stain     Final   Value: MODERATE WBC PRESENT,BOTH PMN AND MONONUCLEAR     RARE SQUAMOUS EPITHELIAL CELLS PRESENT  FEW GRAM POSITIVE COCCI     IN PAIRS IN CLUSTERS FEW GRAM NEGATIVE RODS     Performed at [facility]   Culture     Final   Value: NORMAL OROPHARYNGEAL FLORA     Performed at [facility]   Report Status 10/06/2013 FINAL   Final  FUNGUS CULTURE W SMEAR     Status: None   Collection Time    10/04/13  3:02 AM      Result Value Ref Range Status   Specimen Description SPUTUM    Final   Special Requests Normal   Final   Fungal Smear     Final   Value: NO YEAST OR FUNGAL ELEMENTS SEEN     Performed at [facility]   Culture     Final   Value: CULTURE IN PROGRESS FOR FOUR WEEKS     Performed at [facility]   Report Status PENDING   Incomplete  MRSA PCR SCREENING     Status: None   Collection Time    10/04/13  6:38 PM      Result Value Ref Range Status   MRSA by PCR NEGATIVE  NEGATIVE Final   Comment:            The GeneXpert MRSA Assay (FDA     approved for NASAL specimens     only), is one component of a     comprehensive MRSA colonization     surveillance program. It is not     intended to diagnose MRSA     infection nor to guide or     monitor treatment for     MRSA infections.  AFB CULTURE WITH SMEAR     Status: None   Collection Time    10/05/13  5:53 AM      Result Value Ref Range Status   Specimen Description SPUTUM   Final   Special Requests Normal   Final   Acid Fast Smear     Final   Value: NO ACID FAST BACILLI SEEN     Performed at [facility]   Culture     Final   Value: CULTURE WILL BE EXAMINED FOR 6 WEEKS BEFORE ISSUING A FINAL REPORT     Performed at [facility]   Report Status PENDING   Incomplete  AFB CULTURE WITH SMEAR     Status: None   Collection Time    10/06/13  5:00 AM      Result Value Ref Range Status   Specimen Description LUNG   Final   Special Requests Normal   Final   Acid Fast Smear     Final   Value: NO ACID FAST BACILLI SEEN     Performed at [facility]   Culture     Final   Value: CULTURE WILL BE EXAMINED FOR 6 WEEKS BEFORE ISSUING A FINAL REPORT     Performed at [facility]   Report Status PENDING   Incomplete  CULTURE, BLOOD (ROUTINE X 2)     Status: None   Collection Time    10/09/13  6:56 AM      Result Value Ref Range Status   Specimen Description BLOOD LEFT ARM   Final   Special Requests BOTTLES DRAWN AEROBIC AND ANAEROBIC 10 CC EACH   Final    Culture  Setup Time     Final   Value: 10/09/2013 15:12     Performed  at [facility]     Final   Value:        BLOOD CULTURE RECEIVED NO GROWTH TO DATE CULTURE WILL BE HELD FOR 5 DAYS BEFORE ISSUING A FINAL NEGATIVE REPORT     Note: Culture results may be compromised due to an excessive volume of blood received in culture bottles.     Performed at [facility]   Report Status PENDING   Incomplete  CULTURE, BLOOD (ROUTINE X 2)     Status: None   Collection Time    10/09/13  7:03 AM      Result Value Ref Range Status   Specimen Description BLOOD RIGHT HAND   Final   Special Requests BOTTLES DRAWN AEROBIC AND ANAEROBIC 10 CC EACH   Final   Culture  Setup Time     Final   Value: 10/09/2013 15:11     Performed at [facility]   Culture     Final   Value:        BLOOD CULTURE RECEIVED NO GROWTH TO DATE CULTURE WILL BE HELD FOR 5 DAYS BEFORE ISSUING A FINAL NEGATIVE REPORT     Note: Culture results may be compromised due to an excessive volume of blood received in culture bottles.     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 I recommend continuing IV vancomycin and piperacillin tazobactam for 2-3 weeks total. Chronic Aspergillus pneumonia can be exceedingly difficult to cure without surgery. He may need lifelong antifungal therapy.  Plan: 1. Continue current antimicrobial regimen for now 2. I will arrange followup in her clinic within the next 2 weeks  Michel Bickers, MD [facility] for [place] [phone] pager   [phone] cell 10/11/2013, 6:01 PM

## 2013-10-11 NOTE — Progress Notes (Signed)
[facility] PROGRESS NOTE  [name] [name] IPJ:[id number] DOB: 05/12/1945 DOA: 10/03/2013 PCP: Garnet Koyanagi, DO   Brief narrative 68 year old male with history of COPD with ongoing tobacco use, hypertension, peripheral vascular disease with 3 months history of fever and cough despite multiple course of antibiotics. Patient seen by pulmonary as outpatient and had bronchoscopy done in August 2015 which showed large amount of necrotic tissue with negative cultures. Patient presented to the ED 09/6 with complaints of fever with cough and weight loss. A CT scan of the chest without contrast showed extensive infiltrative/inflammatory process in the left lung with large cavitary process with air fluid level in the left upper lung again suggestive of abscess versus necrotizing pneumonia. Patient placed on airborne precautions and sputum for AFB sent. (Previously been negative).  ID and pulmonary have been consulted and patient is on broad-spectrum antibiotic as well as IV voriconazole for suspicion of pulmonary aspergillus. Patient on admission had drop in hemoglobin to 7.7 and also with low normal blood pressure for which he was admitted to step down for closer monitoring.   Assessment/Plan: Sepsis with Necrotizing pneumonia/ ? Lung abscess - empiric IV vancomycin and Zosyn along with IV voriconazole for suspicion of Aspergillus infection.Marland Kitchen Appreciate pulmonary and ID recommendation. Plan on at least 3 weeks of antibiotics for now. PICC placed. Needs weekly CBC and CMET checked.  -Sputum for AFB x3. Quanteferon gold pending. Aspergillus antibody positive. aspergillus galactoma  Fungal antibodies panel and sputum cx negative. -thoracic surgery consulted to evaluate for drainage/ lobe resection for necrotizing pneumonia/abscess but given his previous RUL lobectomy and poor lung reserve and function along with poor nutritional status, he was deemed a poor surgical candidate. -has low grade fever past 24  hrs with tmax of 100.6 F. Continue supportive care with Tylenol and antitussive      Acute anemia iron deficiency anemia with increased markers of acute inflammation. Appreciate heme recommendations. -Added iron supplements. received  aranexp on 9/11. Will follow up as outpt  Hypertension Continue home blood pressure medications.   Chronic kidney disease stage III Appears to be at baseline.   COPD Counseled strongly on smoking cessation. Nicotine patch.  Severe protein calorie malnutrition Added supplements  Peripheral vascular disease Continue aspirin.  Generalized body ache Continue Dilaudid 2.5 mg every 4 hours .  continue Benadryl. Has severe itching with opiates  Code Status: Full code Family Communication: None at bedside Disposition Plan: Skilled nursing facility on 9/15   Consultants:  ID  Pulmonary  hematology  Procedures:  None  Antibiotics:  IV vancomycin 9/6--  IV Zosyn 9/7--  IV voriconazole 9/7--  Valtrex  9/7--  HPI/Subjective: Patient reports feeling better today. MAXIMUM TEMPERATURE of 100.10f  Objective: Filed Vitals:   10/11/13 1318  BP: 117/60  Pulse: 101  Temp: 98.9 F (37.2 C)  Resp: 18    Intake/Output Summary (Last 24 hours) at 10/11/13 1555 Last data filed at 10/11/13 1300  Gross per 24 hour  Intake   2640 ml  Output   1400 ml  Net   1240 ml   Filed Weights   10/04/13 1844 10/04/13 1849 10/08/13 0500  Weight: 55.6 kg (122 lb 9.2 oz) 55.6 kg (122 lb 9.2 oz) 56 kg (123 lb 7.3 oz)    Exam:   General: no acute distress  Chest: minimal crackles improved over left lung , no rhonchi,  or wheeze   Cardiovascular: S1 and S2 normal , no murmurs   Abdomen: Soft, nontender, non distended  Extremities:  Warm, no edema    Data Reviewed: Basic Metabolic Panel:  Recent Labs Lab 10/05/13 1015 10/06/13 0320 10/07/13 0345 10/10/13 1545 10/11/13 0530  NA 136* 134* 134* 137 139  K 4.1 4.3 4.2 3.8 3.7  CL 97  98 98 98 101  CO2 24 24 25 24 25   GLUCOSE 116* 136* 93 106* 95  BUN 17 15 11 9 8   CREATININE 1.48* 1.45* 1.32 1.35 1.47*  CALCIUM 9.2 8.6 8.9 9.1 8.7   Liver Function Tests:  Recent Labs Lab 10/05/13 1015  AST 19  ALT 8  ALKPHOS 85  BILITOT 0.9  PROT 7.4  ALBUMIN 2.3*   No results found for this basename: LIPASE, AMYLASE,  in the last 168 hours No results found for this basename: AMMONIA,  in the last 168 hours CBC:  Recent Labs Lab 10/04/13 1743  10/06/13 0320 10/07/13 0345 10/09/13 0845 10/10/13 1545 10/11/13 0530  WBC 13.1*  < > 18.1* 15.9* 16.9* 16.5* 15.6*  NEUTROABS 8.7*  --   --   --   --   --   --   HGB 7.3*  < > 9.4* 8.9* 8.5* 8.3* 7.9*  HCT 21.1*  < > 26.9* 25.6* 25.7* 24.9* 22.8*  MCV 74.6*  < > 76.0* 76.4* 78.8 79.3 77.6*  PLT 382  < > 365 383 362 436* 490*  < > = values in this interval not displayed. Cardiac Enzymes: No results found for this basename: CKTOTAL, CKMB, CKMBINDEX, TROPONINI,  in the last 168 hours BNP (last 3 results) No results found for this basename: PROBNP,  in the last 8760 hours CBG: No results found for this basename: GLUCAP,  in the last 168 hours  Recent Results (from the past 240 hour(s))  CULTURE, BLOOD (ROUTINE X 2)     Status: None   Collection Time    10/03/13 10:00 PM      Result Value Ref Range Status   Specimen Description BLOOD LEFT ANTECUBITAL   Final   Special Requests BOTTLES DRAWN AEROBIC AND ANAEROBIC 5CC   Final   Culture  Setup Time     Final   Value: 10/04/2013 00:26     Performed at [facility]   Culture     Final   Value: NO GROWTH 5 DAYS     Performed at [facility]   Report Status 10/10/2013 FINAL   Final  CULTURE, BLOOD (ROUTINE X 2)     Status: None   Collection Time    10/03/13 10:05 PM      Result Value Ref Range Status   Specimen Description BLOOD LEFT FOREARM   Final   Special Requests BOTTLES DRAWN AEROBIC AND ANAEROBIC 5CC   Final   Culture  Setup Time     Final    Value: 10/04/2013 00:26     Performed at [facility]   Culture     Final   Value: NO GROWTH 5 DAYS     Performed at [facility]   Report Status 10/10/2013 FINAL   Final  URINE CULTURE     Status: None   Collection Time    10/04/13 12:28 AM      Result Value Ref Range Status   Specimen Description URINE, RANDOM   Final   Special Requests NONE   Final   Culture  Setup Time     Final   Value: 10/04/2013 11:07     Performed at [facility]  Colony Count     Final   Value: NO GROWTH     Performed at [facility]   Culture     Final   Value: NO GROWTH     Performed at [facility]   Report Status 10/05/2013 FINAL   Final  CULTURE, EXPECTORATED SPUTUM-ASSESSMENT     Status: None   Collection Time    10/04/13 12:48 AM      Result Value Ref Range Status   Specimen Description SPUTUM   Final   Special Requests Immunocompromised   Final   Sputum evaluation     Final   Value: MICROSCOPIC FINDINGS SUGGEST THAT THIS SPECIMEN IS NOT REPRESENTATIVE OF LOWER RESPIRATORY SECRETIONS. PLEASE RECOLLECT. CALLED TO RIMUNDO,J RN AT 1517 09.07.15 BY TIBBITTS,K   Report Status 10/04/2013 FINAL   Final  AFB CULTURE WITH SMEAR     Status: None   Collection Time    10/04/13  2:55 AM      Result Value Ref Range Status   Specimen Description SPUTUM   Final   Special Requests Normal   Final   Acid Fast Smear     Final   Value: NO ACID FAST BACILLI SEEN     Performed at [facility]   Culture     Final   Value: CULTURE WILL BE EXAMINED FOR 6 WEEKS BEFORE ISSUING A FINAL REPORT     Performed at [facility]   Report Status PENDING   Incomplete  CULTURE, EXPECTORATED SPUTUM-ASSESSMENT     Status: None   Collection Time    10/04/13  2:55 AM      Result Value Ref Range Status   Specimen Description SPUTUM   Final   Special Requests NONE   Final   Sputum evaluation     Final   Value: THIS SPECIMEN IS ACCEPTABLE. RESPIRATORY CULTURE REPORT TO  FOLLOW.   Report Status 10/04/2013 FINAL   Final  CULTURE, RESPIRATORY (NON-EXPECTORATED)     Status: None   Collection Time    10/04/13  2:55 AM      Result Value Ref Range Status   Specimen Description SPUTUM   Final   Special Requests NONE   Final   Gram Stain     Final   Value: MODERATE WBC PRESENT,BOTH PMN AND MONONUCLEAR     RARE SQUAMOUS EPITHELIAL CELLS PRESENT     FEW GRAM POSITIVE COCCI     IN PAIRS IN CLUSTERS FEW GRAM NEGATIVE RODS     Performed at [facility]   Culture     Final   Value: NORMAL OROPHARYNGEAL FLORA     Performed at [facility]   Report Status 10/06/2013 FINAL   Final  FUNGUS CULTURE W SMEAR     Status: None   Collection Time    10/04/13  3:02 AM      Result Value Ref Range Status   Specimen Description SPUTUM   Final   Special Requests Normal   Final   Fungal Smear     Final   Value: NO YEAST OR FUNGAL ELEMENTS SEEN     Performed at [facility]   Culture     Final   Value: CULTURE IN PROGRESS FOR FOUR WEEKS     Performed at [facility]   Report Status PENDING   Incomplete  MRSA PCR SCREENING     Status: None   Collection Time    10/04/13  6:38  PM      Result Value Ref Range Status   MRSA by PCR NEGATIVE  NEGATIVE Final   Comment:            The GeneXpert MRSA Assay (FDA     approved for NASAL specimens     only), is one component of a     comprehensive MRSA colonization     surveillance program. It is not     intended to diagnose MRSA     infection nor to guide or     monitor treatment for     MRSA infections.  AFB CULTURE WITH SMEAR     Status: None   Collection Time    10/05/13  5:53 AM      Result Value Ref Range Status   Specimen Description SPUTUM   Final   Special Requests Normal   Final   Acid Fast Smear     Final   Value: NO ACID FAST BACILLI SEEN     Performed at [facility]   Culture     Final   Value: CULTURE WILL BE EXAMINED FOR 6 WEEKS BEFORE ISSUING A FINAL REPORT      Performed at [facility]   Report Status PENDING   Incomplete  AFB CULTURE WITH SMEAR     Status: None   Collection Time    10/06/13  5:00 AM      Result Value Ref Range Status   Specimen Description LUNG   Final   Special Requests Normal   Final   Acid Fast Smear     Final   Value: NO ACID FAST BACILLI SEEN     Performed at [facility]   Culture     Final   Value: CULTURE WILL BE EXAMINED FOR 6 WEEKS BEFORE ISSUING A FINAL REPORT     Performed at [facility]   Report Status PENDING   Incomplete  CULTURE, BLOOD (ROUTINE X 2)     Status: None   Collection Time    10/09/13  6:56 AM      Result Value Ref Range Status   Specimen Description BLOOD LEFT ARM   Final   Special Requests BOTTLES DRAWN AEROBIC AND ANAEROBIC 10 CC EACH   Final   Culture  Setup Time     Final   Value: 10/09/2013 15:12     Performed at [facility]   Culture     Final   Value:        BLOOD CULTURE RECEIVED NO GROWTH TO DATE CULTURE WILL BE HELD FOR 5 DAYS BEFORE ISSUING A FINAL NEGATIVE REPORT     Note: Culture results may be compromised due to an excessive volume of blood received in culture bottles.     Performed at [facility]   Report Status PENDING   Incomplete  CULTURE, BLOOD (ROUTINE X 2)     Status: None   Collection Time    10/09/13  7:03 AM      Result Value Ref Range Status   Specimen Description BLOOD RIGHT HAND   Final   Special Requests BOTTLES DRAWN AEROBIC AND ANAEROBIC 10 CC EACH   Final   Culture  Setup Time     Final   Value: 10/09/2013 15:11     Performed at [facility]   Culture     Final   Value:        BLOOD CULTURE RECEIVED NO GROWTH TO  DATE CULTURE WILL BE HELD FOR 5 DAYS BEFORE ISSUING A FINAL NEGATIVE REPORT     Note: Culture results may be compromised due to an excessive volume of blood received in culture bottles.     Performed at [facility]   Report Status PENDING   Incomplete     Studies: No results  found.  Scheduled Meds: . sodium chloride   Intravenous Once  . amitriptyline  50 mg Oral Daily  . antiseptic oral rinse  7 mL Mouth Rinse BID  . feeding supplement (ENSURE COMPLETE)  237 mL Oral TID BM  . ferrous sulfate  325 mg Oral BID WC  . fluticasone  2 spray Each Nare Daily  . latanoprost  1 drop Both Eyes QHS  . nicotine  21 mg Transdermal Daily  . piperacillin-tazobactam (ZOSYN)  IV  3.375 g Intravenous Q8H  . polyethylene glycol  17 g Oral Daily  . sodium chloride  10-40 mL Intracatheter Q12H  . tamsulosin  0.4 mg Oral QPC supper  . valACYclovir  1,000 mg Oral Daily  . vancomycin  1,000 mg Intravenous Q24H  . voriconazole  200 mg Oral Q12H   Continuous Infusions: . sodium chloride 100 mL/hr at 10/11/13 1047      Time spent: 25 minutes    Alli Jasmer, [place]  [facility] Pager [phone] If 7PM-7AM, please contact night-coverage at [email or web address], password [facility] 10/11/2013, 3:55 PM  LOS: 8 days

## 2013-10-11 NOTE — Care Management Note (Signed)
CARE MANAGEMENT NOTE 10/11/2013  Patient:  [name] [name],[name] [name]   Account Number:  [id number]  Date Initiated:  10/05/2013  Documentation initiated by:  DAVIS,RHONDA  Subjective/Objective Assessment:   pt with lung ca and lung abcess     Action/Plan:   home when stable   Anticipated DC Date:  10/13/2013   Anticipated DC Plan:  SKILLED NURSING FACILITY  In-house referral  Clinical Social Worker      DC Planning Services  CM consult      [facility] Choice  NA   Choice offered to / List presented to:  NA      DME agency  NA     [place] arranged  NA      [place] agency  NA   Status of service:  In process, will continue to follow Medicare Important Message given?   (If response is "NO", the following Medicare IM given date fields will be blank) Date Medicare IM given:   Medicare IM given by:   Date Additional Medicare IM given:   Additional Medicare IM given by:    Discharge Disposition:    Per UR Regulation:  Reviewed for med. necessity/level of care/duration of stay  If discussed at [place] of Stay Meetings, dates discussed:    Comments:  10/11/13 Marney Doctor RN,BSN,NCM See CSW note.  Probable SNF for long term abx.  CM will continue to follow.  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 [name] diease?????/csw looking for snf placement.

## 2013-10-12 DIAGNOSIS — B449 Aspergillosis, unspecified: Secondary | ICD-10-CM | POA: Diagnosis present

## 2013-10-12 MED ORDER — HYDROMORPHONE HCL 2 MG PO TABS: 2.0000 mg | ORAL_TABLET | ORAL | Status: DC | PRN

## 2013-10-12 MED ORDER — VANCOMYCIN HCL IN DEXTROSE 1-5 GM/200ML-% IV SOLN: 1000.0000 mg | INTRAVENOUS | Status: AC

## 2013-10-12 MED ORDER — POLYETHYLENE GLYCOL 3350 17 G PO PACK: 17.0000 g | PACK | Freq: Every day | ORAL | Status: DC

## 2013-10-12 MED ORDER — PIPERACILLIN-TAZOBACTAM 3.375 G IVPB: 3.3750 g | Freq: Three times a day (TID) | INTRAVENOUS | Status: AC

## 2013-10-12 MED ORDER — HEPARIN SOD (PORK) LOCK FLUSH 100 UNIT/ML IV SOLN
500.0000 [IU] | Freq: Once | INTRAVENOUS | Status: AC
  Administered 2013-10-12: 500 [IU] via INTRAVENOUS
  Filled 2013-10-12: qty 5

## 2013-10-12 MED ORDER — VORICONAZOLE 200 MG PO TABS: 200.0000 mg | ORAL_TABLET | Freq: Two times a day (BID) | ORAL | Status: DC

## 2013-10-12 MED ORDER — TAMSULOSIN HCL 0.4 MG PO CAPS: 0.4000 mg | ORAL_CAPSULE | Freq: Every day | ORAL | Status: DC

## 2013-10-12 MED ORDER — GUAIFENESIN-CODEINE 100-10 MG/5ML PO SOLN
5.0000 mL | Freq: Four times a day (QID) | ORAL | Status: DC | PRN
Start: 2013-10-12 — End: 2013-10-19

## 2013-10-12 MED ORDER — BENZONATATE 100 MG PO CAPS: 100.0000 mg | ORAL_CAPSULE | Freq: Three times a day (TID) | ORAL | Status: DC | PRN

## 2013-10-12 MED ORDER — FERROUS SULFATE 325 (65 FE) MG PO TABS: 325.0000 mg | ORAL_TABLET | Freq: Two times a day (BID) | ORAL | Status: DC

## 2013-10-12 MED ORDER — NICOTINE 21 MG/24HR TD PT24: 21.0000 mg | MEDICATED_PATCH | Freq: Every day | TRANSDERMAL | Status: DC

## 2013-10-12 MED ORDER — ENSURE COMPLETE PO LIQD: 237.0000 mL | Freq: Three times a day (TID) | ORAL | Status: AC

## 2013-10-12 NOTE — Progress Notes (Signed)
Pt for discharge to [facility].  CSW facilitated pt discharge needs including contacting facility, faxing pt discharge information via [facility], discussing with pt at bedside, providing RN phone number to call report, and arranging ambulance transportation for pt to [facility].   Pt with flat affect and states that he does not want CSW to contact any family in regard to transition to [facility].   No further social work needs identified at this time.  CSW signing off.   Alison Murray, MSW, [place] Work [phone]

## 2013-10-12 NOTE — Care Management Note (Signed)
CARE MANAGEMENT NOTE 10/12/2013  Patient:  [name] [name],[name] [name]   Account Number:  [id number]  Date Initiated:  10/05/2013  Documentation initiated by:  DAVIS,RHONDA  Subjective/Objective Assessment:   pt with lung ca and lung abcess     Action/Plan:   home when stable   Anticipated DC Date:  10/13/2013   Anticipated DC Plan:  SKILLED NURSING FACILITY  In-house referral  Clinical Social Worker      DC Planning Services  CM consult      [facility] Choice  NA   Choice offered to / List presented to:  NA      DME agency  NA     [place] arranged  NA      [place] agency  NA   Status of service:  In process, will continue to follow Medicare Important Message given?  YES (If response is "NO", the following Medicare IM given date fields will be blank) Date Medicare IM given:  10/12/2013 Medicare IM given by:  Leafy Kindle Date Additional Medicare IM given:   Additional Medicare IM given by:    Discharge Disposition:    Per UR Regulation:  Reviewed for med. necessity/level of care/duration of stay  If discussed at [place] of Stay Meetings, dates discussed:    Comments:  10/12/13 Marney Doctor RN,BSN,NCM Pt [facility] with [facility]x today.  10/11/13 Marney Doctor RN,BSN,NCM Was called by CSW to speak to pt about HH.  Informed pt that if he went home on IV abx that he would need to be taught how to do it himself or a family/friend would need to be taught and the [facility] agency would support them.  I also told the patient that the MD notes are saying he will potentially need IV ABX for 2 more weeks.  Pt stated to just "set him up to go to that place on [address]" [facility]) I called CSW to inform that the pt does want SNF after our conversation.  10/11/13 Marney Doctor RN,BSN,NCM See CSW note.  Probable SNF for long term abx.  CM will continue to follow.  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 [name] diease?????/csw looking for snf placement.

## 2013-10-12 NOTE — Discharge Summary (Signed)
Physician Discharge Summary  [name] [name] PJK:[id number] DOB: 1946-01-26 DOA: 10/03/2013  PCP: Garnet Koyanagi, DO  Admit date: 10/03/2013 Discharge date: 10/12/2013  Time spent: 35 minutes  Recommendations for Outpatient Follow-up:  1. Discharge to SNF. Patient will be on IV vancomycin and zosyn for another 2 weeks ( ID will determine further antibiotic need during outpt follow up). i have placed stop date for abx as 10/27/2013 2. Patient will continue voriconazole ( may need for lifelong) 3. Please check CBC and CMP weekly while on antibiotics and antifungal.  4. Patient will follow up with hematologist Dr Lona Kettle as outpatient.   Discharge Diagnoses:  Principal Problem:   Sepsis   Active Problems:   Necrotizing pneumonia   Aspergillus pneumonia   Hypertension   Peripheral vascular disease   Glaucoma   Smoker   Protein-calorie malnutrition, severe   CKD (chronic kidney disease) stage 3, GFR 30-59 ml/min   History of lung abscess   Anemia, iron deficiency   Discharge Condition:fair  Diet recommendation: regular with supplements  Filed Weights   10/04/13 1844 10/04/13 1849 10/08/13 0500  Weight: 55.6 kg (122 lb 9.2 oz) 55.6 kg (122 lb 9.2 oz) 56 kg (123 lb 7.3 oz)    History of present illness:  Please refer to admission H&P for details, but in brief, 68 year old male with history of COPD with ongoing tobacco use, hypertension, peripheral vascular disease with 3 months history of fever and cough despite multiple course of antibiotics. Patient seen by pulmonary as outpatient and had bronchoscopy done in August 2015 which showed large amount of necrotic tissue with negative cultures. Patient presented to the ED 09/6 with complaints of fever with cough and weight loss. A CT scan of the chest without contrast showed extensive infiltrative/inflammatory process in the left lung with large cavitary process with air fluid level in the left upper lung again suggestive of abscess versus  necrotizing pneumonia. Patient placed on airborne precautions and sputum for AFB sent. (Previously been negative). ID and pulmonary have been consulted and patient is on broad-spectrum antibiotic as well as IV voriconazole for suspicion of pulmonary aspergillus.  Patient on admission had drop in hemoglobin to 7.7 and also with low normal blood pressure for which he was admitted to step down for closer monitoring. With high grade fever, leucocytosis and tachycardia patient met criteria for sepsis.   Hospital Course:  Sepsis secondary to  Necrotizing pneumonia/ ? Lung abscess  - placed on empiric IV vancomycin and Zosyn along with IV voriconazole for suspicion of Aspergillus infection.Marland Kitchen Appreciate pulmonary and ID recommendation.  -Plan on at least 3 weeks of antibiotics ( has completed 1 week of therapy  in the hospital already).  PICC placed. Needs weekly CBC and CMET checked.  -Sputum for AFB x3 and sputum cx negative.  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  per ID he may need lifelong antifungal . -Thoracic surgery consulted to evaluate for drainage/ lobe resection for necrotizing pneumonia/abscess but given his previous RUL lobectomy and poor lung reserve and function along with poor nutritional status, he was deemed a poor surgical candidate.  -has low grade temp but symptomatically better. Continue tylenol, opral dilaudid prn for chest pain and antitussives.  -Patient seen by PT and given his overall deconditioning and illness recommend SNF. He will be followed by Dr. Megan Salon and decide on his antibiotics and antifungal course.   Acute anemia  iron deficiency anemia with increased markers of acute inflammation. Appreciate heme recommendations. -Added  iron supplements. received aranexp on 9/11. Will follow up as outpt   Hypertension  Continue home blood pressure medications.   Chronic kidney disease stage III  Appears to be at baseline.  Follow as outpt  COPD  Counseled strongly on smoking cessation. Nicotine patch prescribed.   Severe protein calorie malnutrition  Added supplements   Peripheral vascular disease  Continue aspirin.   Generalized body ache  continue low dose dilaudid prn. Benadryl for severe itching with opiates  Code Status: Full code   Family Communication: None at bedside   Disposition Plan: Skilled nursing facility   Consultants:  ID  Pulmonary  Hematology Cardiothoracic surgery  Procedures:  None Antibiotics:  IV vancomycin 9/6--  IV Zosyn 9/7--  IV voriconazole 9/7--  Valtrex 9/7--       Discharge Exam: Filed Vitals:   10/12/13 0535  BP: 97/71  Pulse: 64  Temp: 98.2 F (36.8 C)  Resp: 18   General: elderly thin built male in no acute distress  HEENT: no pallor, temporal wasting, moist mucosa Chest: minimal crackles improved over left lung , no rhonchi, or wheeze  Cardiovascular: S1 and S2 normal , no murmurs  Abdomen: Soft, nontender, non distended  Extremities: Warm, no edema CNS: alert and oriented  Discharge Instructions You were cared for by a hospitalist during your hospital stay. If you have any questions about your discharge medications or the care you received while you were in the hospital after you are discharged, you can call the unit and asked to speak with the hospitalist on call if the hospitalist that took care of you is not available. Once you are discharged, your primary care physician will handle any further medical issues. Please note that NO REFILLS for any discharge medications will be authorized once you are discharged, as it is imperative that you return to your primary care physician (or establish a relationship with a primary care physician if you do not have one) for your aftercare needs so that they can reassess your need for medications and monitor your lab values.   Current Discharge Medication List    START taking these medications    Details  benzonatate (TESSALON) 100 MG capsule Take 1 capsule (100 mg total) by mouth 3 (three) times daily as needed for cough. Qty: 20 capsule, Refills: 0    feeding supplement, ENSURE COMPLETE, (ENSURE COMPLETE) LIQD Take 237 mLs by mouth 3 (three) times daily between meals. Qty: 90 Bottle, Refills: 0    ferrous sulfate 325 (65 FE) MG tablet Take 1 tablet (325 mg total) by mouth 2 (two) times daily with a meal. Qty: 60 tablet, Refills: 3    guaiFENesin-codeine 100-10 MG/5ML syrup Take 5 mLs by mouth every 6 (six) hours as needed (cough not controlled by benzonatate). Qty: 120 mL, Refills: 0    HYDROmorphone (DILAUDID) 2 MG tablet Take 1 tablet (2 mg total) by mouth every 4 (four) hours as needed for severe pain. Qty: 30 tablet, Refills: 0    nicotine (NICODERM CQ - DOSED IN MG/24 HOURS) 21 mg/24hr patch Place 1 patch (21 mg total) onto the skin daily. Qty: 28 patch, Refills: 0    piperacillin-tazobactam (ZOSYN) 3.375 GM/50ML IVPB Inject 50 mLs (3.375 g total) into the vein every 8 (eight) hours. Qty: 50 mL, Refills: 0  Until 10/27/2013    polyethylene glycol (MIRALAX / GLYCOLAX) packet Take 17 g by mouth daily. Qty: 14 each, Refills: 0    tamsulosin (FLOMAX) 0.4 MG CAPS capsule Take  1 capsule (0.4 mg total) by mouth daily after supper. Qty: 30 capsule, Refills: 0    vancomycin (VANCOCIN) 1 GM/200ML SOLN Inject 200 mLs (1,000 mg total) into the vein daily. Qty: 4000 mL, Refills: 0   Until 10/27/2013    voriconazole (VFEND) 200 MG tablet Take 1 tablet (200 mg total) by mouth every 12 (twelve) hours. Qty: 60 tablet, Refills: 0      CONTINUE these medications which have NOT CHANGED   Details  acetaminophen (TYLENOL) 500 MG tablet Take 1,000 mg by mouth every 6 (six) hours as needed.    allopurinol (ZYLOPRIM) 100 MG tablet Take 100 mg by mouth daily as needed (gout pain).     amitriptyline (ELAVIL) 50 MG tablet Take 1 tablet (50 mg total) by mouth daily. Qty: 30 tablet, Refills:  5   Associated Diagnoses: Insomnia    amLODipine (NORVASC) 10 MG tablet Take 1 tablet (10 mg total) by mouth daily. Qty: 90 tablet, Refills: 1   Associated Diagnoses: HTN (hypertension)    aspirin 81 MG chewable tablet Chew 162 mg by mouth daily as needed for fever.    fluticasone (FLONASE) 50 MCG/ACT nasal spray Place 2 sprays into both nostrils daily. Qty: 16 g, Refills: 6   Associated Diagnoses: Seasonal allergies    hydrochlorothiazide (MICROZIDE) 12.5 MG capsule Take 1 capsule (12.5 mg total) by mouth daily. Qty: 30 capsule, Refills: 5   Associated Diagnoses: HTN (hypertension)    latanoprost (XALATAN) 0.005 % ophthalmic solution Place 1 drop into both eyes at bedtime.     PROAIR HFA 108 (90 BASE) MCG/ACT inhaler Inhale 2 puffs into the lungs every 4 (four) hours as needed for wheezing or shortness of breath.     valACYclovir (VALTREX) 1000 MG tablet Take 1 tablet (1,000 mg total) by mouth daily. 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 Morphine And Related Itching    Head to toe  . Chantix [Varenicline] Other (See Comments)    Dreams, itching, suicidal ideation  . Hydromorphone Hcl Itching    Tolerates Hydrocodone-Acetaminophen  . Ibuprofen Nausea And Vomiting  . Omnipaque [Iohexol]      Code: HIVES, Desc: FACIAL HIVES S/P IV CONTRAST.Marland KitchenMarland KitchenOK W/ 25MG BENADRYL//A.C., Onset Date: [id number]   . Shellfish Allergy Swelling   Follow-up Information   Follow up with Garnet Koyanagi, DO In 1 week. (after discharge from SNF)    Specialty:  Family Medicine   Contact information:   [place] [address] [phone]       Follow up with Michel Bickers, MD In 2 weeks. (OFFICE WILL CALL FOR APPT)     Specialty:  Infectious Diseases   Contact information:   [address] [place] [address] [phone]        The results of significant diagnostics from this hospitalization (including imaging, microbiology, ancillary and laboratory) are listed below for reference.    Significant Diagnostic Studies: Ct Chest Wo Contrast  10/04/2013   CLINICAL DATA:  Fever for 2 weeks. Diagnosed with fungal infection of lungs 2 weeks ago. Previous history of lung cancer with right lung resection. Cough. 30 lb weight loss over 1 month.  EXAM:  CT CHEST WITHOUT CONTRAST  TECHNIQUE: Multidetector CT imaging of the chest was performed following the standard protocol without IV contrast.  COMPARISON:  12/05/2010  FINDINGS: Postoperative right partial pneumonectomy with surgical clips in the right axilla. Diffuse pulmonary hyperinflation consistent with emphysema. Scarring in the apices. The left lung demonstrates a cavitary lesion with an air-fluid level in the upper lung region, measuring about 5 cm diameter. There is diffuse surrounding airspace and interstitial infiltration throughout the left upper lung, lingula, and superior segment of left lower lung. Left apical gas collection could represent pre-existing emphysematous bulla. No pleural effusion. Changes are consistent with infectious or inflammatory process and could represent necrotizing pneumonia or atypical pneumonia with lung abscess. Airways appear patent. No pneumothorax. Normal heart size. Calcification in the aorta. Esophagus is decompressed. No significant lymphadenopathy is noted in the chest.  Included portions of the upper abdominal organs demonstrate calcifications in the pancreas extensive vascular calcifications in the upper abdomen. Postoperative changes in the cervical spine.  IMPRESSION: Extensive infiltrative/inflammatory process in the left lung with large cavitary process with air-fluid level in the left upper lung. Changes  likely to represent pneumonia or atypical pneumonia with cavitation due to abscess or necrotizing pneumonia. Followup after resolution of acute process is recommended to exclude underlying cavitary mass lesion.   Electronically Signed   By: Lucienne Capers M.D.   On: 10/04/2013 00:33   Dg Chest Port 1 View  (if Code Sepsis Called)  10/03/2013   CLINICAL DATA:  Fever.  COPD.  Right lung carcinoma.  EXAM: PORTABLE CHEST - 1 VIEW  COMPARISON:  09/16/13  FINDINGS: Increased airspace consolidation seen in the left upper lobe. Changes of COPD again demonstrated. No evidence of pneumothorax or pleural effusion. Surgical clips again seen in the right hilum.  IMPRESSION: Increased left upper lobe consolidation.  COPD.   Electronically Signed   By: Earle Gell M.D.   On: 10/03/2013 20:51   Dg Chest Port 1 View  09/16/2013   CLINICAL DATA:  68 year old male status post bronchoscopy, left upper lobe biopsy. Initial encounter.  EXAM: PORTABLE CHEST - 1 VIEW  COMPARISON:  09/01/2013 and earlier.  FINDINGS: Portable AP semi upright view at 0836 hrs. Slightly lower lung volumes. Stable cardiac size and mediastinal contours. No pneumothorax or pleural effusion. Stable radiographic appearance of the large left upper lung lesion. Postoperative changes re - identified in the right upper lobe and at the right hilum. No areas of worsening ventilation.  IMPRESSION: No pneumothorax or new cardiopulmonary abnormality identified status post bronchoscopic biopsy.   Electronically Signed   By: Lars Pinks M.D.   On: 09/16/2013 09:09   Dg C-arm Bronchoscopy  09/16/2013   CLINICAL DATA: bronch procedure   C-ARM BRONCHOSCOPY  Fluoroscopy was utilized by the requesting physician.  No radiographic  interpretation.     Microbiology: Recent Results (from the past 240 hour(s))  CULTURE, BLOOD (ROUTINE X 2)     Status: None   Collection Time    10/03/13 10:00 PM      Result Value Ref Range Status   Specimen Description BLOOD LEFT  ANTECUBITAL   Final   Special Requests BOTTLES DRAWN AEROBIC AND ANAEROBIC 5CC   Final   Culture  Setup Time     Final   Value: 10/04/2013 00:26     Performed at [facility]   Culture     Final   Value: NO GROWTH 5 DAYS     Performed at [facility]  Report Status 10/10/2013 FINAL   Final  CULTURE, BLOOD (ROUTINE X 2)     Status: None   Collection Time    10/03/13 10:05 PM      Result Value Ref Range Status   Specimen Description BLOOD LEFT FOREARM   Final   Special Requests BOTTLES DRAWN AEROBIC AND ANAEROBIC 5CC   Final   Culture  Setup Time     Final   Value: 10/04/2013 00:26     Performed at [facility]   Culture     Final   Value: NO GROWTH 5 DAYS     Performed at [facility]   Report Status 10/10/2013 FINAL   Final  URINE CULTURE     Status: None   Collection Time    10/04/13 12:28 AM      Result Value Ref Range Status   Specimen Description URINE, RANDOM   Final   Special Requests NONE   Final   Culture  Setup Time     Final   Value: 10/04/2013 11:07     Performed at [place]     Final   Value: NO GROWTH     Performed at [facility]   Culture     Final   Value: NO GROWTH     Performed at [facility]   Report Status 10/05/2013 FINAL   Final  CULTURE, EXPECTORATED SPUTUM-ASSESSMENT     Status: None   Collection Time    10/04/13 12:48 AM      Result Value Ref Range Status   Specimen Description SPUTUM   Final   Special Requests Immunocompromised   Final   Sputum evaluation     Final   Value: MICROSCOPIC FINDINGS SUGGEST THAT THIS SPECIMEN IS NOT REPRESENTATIVE OF LOWER RESPIRATORY SECRETIONS. PLEASE RECOLLECT. CALLED TO RIMUNDO,J RN AT [phone] BY TIBBITTS,K   Report Status 10/04/2013 FINAL   Final  AFB CULTURE WITH SMEAR     Status: None   Collection Time    10/04/13  2:55 AM      Result Value Ref Range Status   Specimen Description SPUTUM   Final   Special Requests Normal    Final   Acid Fast Smear     Final   Value: NO ACID FAST BACILLI SEEN     Performed at [facility]   Culture     Final   Value: CULTURE WILL BE EXAMINED FOR 6 WEEKS BEFORE ISSUING A FINAL REPORT     Performed at [facility]   Report Status PENDING   Incomplete  CULTURE, EXPECTORATED SPUTUM-ASSESSMENT     Status: None   Collection Time    10/04/13  2:55 AM      Result Value Ref Range Status   Specimen Description SPUTUM   Final   Special Requests NONE   Final   Sputum evaluation     Final   Value: THIS SPECIMEN IS ACCEPTABLE. RESPIRATORY CULTURE REPORT TO FOLLOW.   Report Status 10/04/2013 FINAL   Final  CULTURE, RESPIRATORY (NON-EXPECTORATED)     Status: None   Collection Time    10/04/13  2:55 AM      Result Value Ref Range Status   Specimen Description SPUTUM   Final   Special Requests NONE   Final   Gram Stain     Final   Value: MODERATE WBC PRESENT,BOTH PMN AND MONONUCLEAR     RARE SQUAMOUS  EPITHELIAL CELLS PRESENT     FEW GRAM POSITIVE COCCI     IN PAIRS IN CLUSTERS FEW GRAM NEGATIVE RODS     Performed at [facility]   Culture     Final   Value: NORMAL OROPHARYNGEAL FLORA     Performed at [facility]   Report Status 10/06/2013 FINAL   Final  FUNGUS CULTURE W SMEAR     Status: None   Collection Time    10/04/13  3:02 AM      Result Value Ref Range Status   Specimen Description SPUTUM   Final   Special Requests Normal   Final   Fungal Smear     Final   Value: NO YEAST OR FUNGAL ELEMENTS SEEN     Performed at [facility]   Culture     Final   Value: CULTURE IN PROGRESS FOR FOUR WEEKS     Performed at [facility]   Report Status PENDING   Incomplete  MRSA PCR SCREENING     Status: None   Collection Time    10/04/13  6:38 PM      Result Value Ref Range Status   MRSA by PCR NEGATIVE  NEGATIVE Final   Comment:            The GeneXpert MRSA Assay (FDA     approved for NASAL specimens     only), is one component of  a     comprehensive MRSA colonization     surveillance program. It is not     intended to diagnose MRSA     infection nor to guide or     monitor treatment for     MRSA infections.  AFB CULTURE WITH SMEAR     Status: None   Collection Time    10/05/13  5:53 AM      Result Value Ref Range Status   Specimen Description SPUTUM   Final   Special Requests Normal   Final   Acid Fast Smear     Final   Value: NO ACID FAST BACILLI SEEN     Performed at [facility]   Culture     Final   Value: CULTURE WILL BE EXAMINED FOR 6 WEEKS BEFORE ISSUING A FINAL REPORT     Performed at [facility]   Report Status PENDING   Incomplete  AFB CULTURE WITH SMEAR     Status: None   Collection Time    10/06/13  5:00 AM      Result Value Ref Range Status   Specimen Description LUNG   Final   Special Requests Normal   Final   Acid Fast Smear     Final   Value: NO ACID FAST BACILLI SEEN     Performed at [facility]   Culture     Final   Value: CULTURE WILL BE EXAMINED FOR 6 WEEKS BEFORE ISSUING A FINAL REPORT     Performed at [facility]   Report Status PENDING   Incomplete  CULTURE, BLOOD (ROUTINE X 2)     Status: None   Collection Time    10/09/13  6:56 AM      Result Value Ref Range Status   Specimen Description BLOOD LEFT ARM   Final   Special Requests BOTTLES DRAWN AEROBIC AND ANAEROBIC 10 CC EACH   Final   Culture  Setup Time     Final   Value: 10/09/2013  15:12     Performed at [facility]     Final   Value:        BLOOD CULTURE RECEIVED NO GROWTH TO DATE CULTURE WILL BE HELD FOR 5 DAYS BEFORE ISSUING A FINAL NEGATIVE REPORT     Note: Culture results may be compromised due to an excessive volume of blood received in culture bottles.     Performed at [facility]   Report Status PENDING   Incomplete  CULTURE, BLOOD (ROUTINE X 2)     Status: None   Collection Time    10/09/13  7:03 AM      Result Value Ref Range Status   Specimen  Description BLOOD RIGHT HAND   Final   Special Requests BOTTLES DRAWN AEROBIC AND ANAEROBIC 10 CC EACH   Final   Culture  Setup Time     Final   Value: 10/09/2013 15:11     Performed at [facility]   Culture     Final   Value:        BLOOD CULTURE RECEIVED NO GROWTH TO DATE CULTURE WILL BE HELD FOR 5 DAYS BEFORE ISSUING A FINAL NEGATIVE REPORT     Note: Culture results may be compromised due to an excessive volume of blood received in culture bottles.     Performed at [facility]   Report Status PENDING   Incomplete     Labs: Basic Metabolic Panel:  Recent Labs Lab 10/05/13 1015 10/06/13 0320 10/07/13 0345 10/10/13 1545 10/11/13 0530  NA 136* 134* 134* 137 139  K 4.1 4.3 4.2 3.8 3.7  CL 97 98 98 98 101  CO2 24 24 25 24 25   GLUCOSE 116* 136* 93 106* 95  BUN 17 15 11 9 8   CREATININE 1.48* 1.45* 1.32 1.35 1.47*  CALCIUM 9.2 8.6 8.9 9.1 8.7   Liver Function Tests:  Recent Labs Lab 10/05/13 1015  AST 19  ALT 8  ALKPHOS 85  BILITOT 0.9  PROT 7.4  ALBUMIN 2.3*   No results found for this basename: LIPASE, AMYLASE,  in the last 168 hours No results found for this basename: AMMONIA,  in the last 168 hours CBC:  Recent Labs Lab 10/06/13 0320 10/07/13 0345 10/09/13 0845 10/10/13 1545 10/11/13 0530  WBC 18.1* 15.9* 16.9* 16.5* 15.6*  HGB 9.4* 8.9* 8.5* 8.3* 7.9*  HCT 26.9* 25.6* 25.7* 24.9* 22.8*  MCV 76.0* 76.4* 78.8 79.3 77.6*  PLT 365 383 362 436* 490*   Cardiac Enzymes: No results found for this basename: CKTOTAL, CKMB, CKMBINDEX, TROPONINI,  in the last 168 hours BNP: BNP (last 3 results) No results found for this basename: PROBNP,  in the last 8760 hours CBG: No results found for this basename: GLUCAP,  in the last 168 hours     Signed:  Tarahji Ramthun, [place]  [facility] 10/12/2013, 8:34 AM

## 2013-10-12 NOTE — Discharge Instructions (Signed)
Nicotine Addiction Nicotine can act as both a stimulant (excites/activates) and a sedative (calms/quiets). Immediately after exposure to nicotine, there is a "kick" caused in part by the drug's stimulation of the adrenal glands and resulting discharge of adrenaline (epinephrine). The rush of adrenaline stimulates the body and causes a sudden release of sugar. This means that smokers are always slightly hyperglycemic. Hyperglycemic means that the blood sugar is high, just like in diabetics. Nicotine also decreases the amount of insulin which helps control sugar levels in the body. There is an increase in blood pressure, breathing, and the rate of heart beats.  In addition, nicotine indirectly causes a release of dopamine in the brain that controls pleasure and motivation. A similar reaction is seen with other drugs of abuse, such as cocaine and heroin. This dopamine release is thought to cause the pleasurable sensations when smoking. In some different cases, nicotine can also create a calming effect, depending on sensitivity of the smoker's nervous system and the dose of nicotine taken. WHAT HAPPENS WHEN NICOTINE IS TAKEN FOR LONG PERIODS OF TIME?  Long-term use of nicotine results in addiction. It is difficult to stop.  Repeated use of nicotine creates tolerance. Higher doses of nicotine are needed to get the "kick." When nicotine use is stopped, withdrawal may last a month or more. Withdrawal may begin within a few hours after the last cigarette. Symptoms peak within the first few days and may lessen within a few weeks. For some people, however, symptoms may last for months or longer. Withdrawal symptoms include:   Irritability.  Craving.  Learning and attention deficits.  Sleep disturbances.  Increased appetite. Craving for tobacco may last for 6 months or longer. Many behaviors done while using nicotine can also play a part in the severity of withdrawal symptoms. For some people, the feel,  smell, and sight of a cigarette and the ritual of obtaining, handling, lighting, and smoking the cigarette are closely linked with the pleasure of smoking. When stopped, they also miss the related behaviors which make the withdrawal or craving worse. While nicotine gum and patches may lessen the drug aspects of withdrawal, cravings often persist. WHAT ARE THE MEDICAL CONSEQUENCES OF NICOTINE USE?  Nicotine addiction accounts for one-third of all cancers. The top cancer caused by tobacco is lung cancer. Lung cancer is the number one cancer killer of both men and women.  Smoking is also associated with cancers of the:  Mouth.  Pharynx.  Larynx.  Esophagus.  Stomach.  Pancreas.  Cervix.  Kidney.  Ureter.  Bladder.  Smoking also causes lung diseases such as lasting (chronic) bronchitis and emphysema.  It worsens asthma in adults and children.  Smoking increases the risk of heart disease, including:  Stroke.  Heart attack.  Vascular disease.  Aneurysm.  Passive or secondary smoke can also increase medical risks including:  Asthma in children.  Sudden Infant Death Syndrome (SIDS).  Additionally, dropped cigarettes are the leading cause of residential fire fatalities.  Nicotine poisoning has been reported from accidental ingestion of tobacco products by children and pets. Death usually results in a few minutes from respiratory failure (when a person stops breathing) caused by paralysis. TREATMENT   Medication. Nicotine replacement medicines such as nicotine gum and the patch are used to stop smoking. These medicines gradually lower the dosage of nicotine in the body. These medicines do not contain the carbon monoxide and other toxins found in tobacco smoke.  Hypnotherapy.  Relaxation therapy.  Nicotine Anonymous (a 12-step support   program). Find times and locations in your local yellow pages. Document Released: 09/20/2003 Document Revised: 04/08/2011 Document  Reviewed: 03/12/2013 Exit[employer] Patient Information 2015 [employer]. This information is not intended to replace advice given to you by your health care provider. Make sure you discuss any questions you have with your health care provider.  

## 2013-10-13 ENCOUNTER — Encounter: Payer: Self-pay | Admitting: *Deleted

## 2013-10-13 ENCOUNTER — Non-Acute Institutional Stay (SKILLED_NURSING_FACILITY): Payer: Medicare Other | Admitting: Internal Medicine

## 2013-10-13 DIAGNOSIS — J449 Chronic obstructive pulmonary disease, unspecified: Secondary | ICD-10-CM

## 2013-10-13 DIAGNOSIS — N183 Chronic kidney disease, stage 3 unspecified: Secondary | ICD-10-CM

## 2013-10-13 DIAGNOSIS — J850 Gangrene and necrosis of lung: Secondary | ICD-10-CM

## 2013-10-13 DIAGNOSIS — B449 Aspergillosis, unspecified: Secondary | ICD-10-CM

## 2013-10-13 DIAGNOSIS — J852 Abscess of lung without pneumonia: Secondary | ICD-10-CM

## 2013-10-13 NOTE — Telephone Encounter (Signed)
error 

## 2013-10-14 ENCOUNTER — Ambulatory Visit: Payer: Medicare Other | Admitting: Internal Medicine

## 2013-10-14 ENCOUNTER — Telehealth: Payer: Self-pay | Admitting: Licensed Clinical Social Worker

## 2013-10-14 DIAGNOSIS — J449 Chronic obstructive pulmonary disease, unspecified: Secondary | ICD-10-CM | POA: Insufficient documentation

## 2013-10-14 DIAGNOSIS — J4489 Other specified chronic obstructive pulmonary disease: Secondary | ICD-10-CM | POA: Insufficient documentation

## 2013-10-14 LAB — FUNGUS CULTURE W SMEAR
FUNGAL SMEAR: NONE SEEN
Special Requests: NORMAL

## 2013-10-14 NOTE — Telephone Encounter (Signed)
Christy from [employer] called to notify Dr. Megan Salon of a rejected quantaferon Gold test  due to over filling the tube when the patient was in the hospital.  Test was unable to be performed
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
## 2013-10-14 NOTE — Telephone Encounter (Signed)
We will just order the quantiferon at his next ID o/p appointment if he still needs it.

## 2013-10-15 ENCOUNTER — Non-Acute Institutional Stay (SKILLED_NURSING_FACILITY): Payer: Medicare Other | Admitting: Internal Medicine

## 2013-10-15 DIAGNOSIS — N183 Chronic kidney disease, stage 3 unspecified: Secondary | ICD-10-CM

## 2013-10-15 DIAGNOSIS — N039 Chronic nephritic syndrome with unspecified morphologic changes: Secondary | ICD-10-CM

## 2013-10-15 DIAGNOSIS — D631 Anemia in chronic kidney disease: Secondary | ICD-10-CM

## 2013-10-15 DIAGNOSIS — D7289 Other specified disorders of white blood cells: Secondary | ICD-10-CM

## 2013-10-15 DIAGNOSIS — R739 Hyperglycemia, unspecified: Secondary | ICD-10-CM

## 2013-10-15 DIAGNOSIS — R7309 Other abnormal glucose: Secondary | ICD-10-CM

## 2013-10-15 LAB — ZINC: Zinc: 59 ug/dL — ABNORMAL LOW (ref 60–130)

## 2013-10-15 LAB — CULTURE, BLOOD (ROUTINE X 2)
CULTURE: NO GROWTH
Culture: NO GROWTH

## 2013-10-15 LAB — COPPER, SERUM: COPPER: 153 ug/dL (ref 70–175)

## 2013-10-15 NOTE — Telephone Encounter (Signed)
Ok thank you 

## 2013-10-18 LAB — IGE: IgE (Immunoglobulin E), Serum: 23280 IU/mL — ABNORMAL HIGH (ref 0.0–180.0)

## 2013-10-19 ENCOUNTER — Ambulatory Visit (INDEPENDENT_AMBULATORY_CARE_PROVIDER_SITE_OTHER): Payer: Medicare Other | Admitting: Internal Medicine

## 2013-10-19 ENCOUNTER — Encounter: Payer: Self-pay | Admitting: Internal Medicine

## 2013-10-19 ENCOUNTER — Other Ambulatory Visit: Payer: Self-pay | Admitting: *Deleted

## 2013-10-19 VITALS — BP 103/67 | HR 111 | Temp 98.7°F

## 2013-10-19 DIAGNOSIS — J852 Abscess of lung without pneumonia: Secondary | ICD-10-CM

## 2013-10-19 DIAGNOSIS — B4481 Allergic bronchopulmonary aspergillosis: Secondary | ICD-10-CM

## 2013-10-19 DIAGNOSIS — E43 Unspecified severe protein-calorie malnutrition: Secondary | ICD-10-CM

## 2013-10-19 DIAGNOSIS — B441 Other pulmonary aspergillosis: Secondary | ICD-10-CM

## 2013-10-19 LAB — CBC WITH DIFFERENTIAL/PLATELET
BASOS PCT: 0 % (ref 0–1)
Basophils Absolute: 0 10*3/uL (ref 0.0–0.1)
EOS ABS: 1.8 10*3/uL — AB (ref 0.0–0.7)
EOS PCT: 11 % — AB (ref 0–5)
HEMATOCRIT: 32.6 % — AB (ref 39.0–52.0)
Hemoglobin: 10.4 g/dL — ABNORMAL LOW (ref 13.0–17.0)
Lymphocytes Relative: 11 % — ABNORMAL LOW (ref 12–46)
Lymphs Abs: 1.8 10*3/uL (ref 0.7–4.0)
MCH: 26.3 pg (ref 26.0–34.0)
MCHC: 31.9 g/dL (ref 30.0–36.0)
MCV: 82.3 fL (ref 78.0–100.0)
MONO ABS: 1.3 10*3/uL — AB (ref 0.1–1.0)
Monocytes Relative: 8 % (ref 3–12)
Neutro Abs: 11.3 10*3/uL — ABNORMAL HIGH (ref 1.7–7.7)
Neutrophils Relative %: 70 % (ref 43–77)
Platelets: 746 10*3/uL — ABNORMAL HIGH (ref 150–400)
RBC: 3.96 MIL/uL — ABNORMAL LOW (ref 4.22–5.81)
RDW: 19.7 % — ABNORMAL HIGH (ref 11.5–15.5)
WBC: 16.2 10*3/uL — ABNORMAL HIGH (ref 4.0–10.5)

## 2013-10-19 MED ORDER — GUAIFENESIN-CODEINE 100-10 MG/5ML PO SOLN: ORAL | Status: DC

## 2013-10-19 NOTE — Progress Notes (Signed)
Subjective:    Patient ID: [name] [name], male    DOB: July 18, 1945, 68 y.o.   MRN: [id number]  HPI [name] [name] is a 68 yo M with hx of COPD, lung Ca  Dx in 2007 s/p lung resection of R apex,and CKD. He was noted to have productive cough, nightsweats, low grade fevers, weight loss since end of June. He is followed by Dr. Melvyn Novas at [facility] pulmonary, and seen in pulmonary clinic 8-20 due to several months of cough. He had bronchoscopy at that time. Cx are (-) but his path showed fungal hyphae. His AFB stain was (-). He was treated with levaquin then augmentin as an outpt without improvement. He was admitted in mid September for worsening pulmonary symptoms, plus   temp 102.5, WBC 14.2. His CXR showed increased LUL consolidation plus large cavitary lesion, confirmed on CT. His aspergillus serology was positive. Sputum cx did not identify any bacteria likely due to being started on antibiotics. He had 3 AFB sputum specimens which were negative. He was discharged on 3 wks of vanco/zosyn to discontinue on 9/30, plus voriconazole 200mg  BID (likely life long). He reports tolerating his antibiotics, no diarrhea, no rash, no thrush. He still has decreased appetite. Has lost 5-10 lb throughout this experience. Still has occ night sweats. Cough no worse.   Allergies  Allergen Reactions  . Morphine And Related Itching    Head to toe  . Chantix [Varenicline] Other (See Comments)    Dreams, itching, suicidal ideation  . Hydromorphone Hcl Itching    Tolerates Hydrocodone-Acetaminophen  . Ibuprofen Nausea And Vomiting  . Omnipaque [Iohexol]      Code: HIVES, Desc: FACIAL HIVES S/P IV CONTRAST.Marland KitchenMarland KitchenOK W/ 25MG  BENADRYL//A.C., Onset Date: [id number]   . Shellfish Allergy Swelling   Current Outpatient Prescriptions on File Prior to Visit  Medication Sig Dispense Refill  . acetaminophen (TYLENOL) 500 MG tablet Take 1,000 mg by mouth every 6 (six) hours as needed.      Marland Kitchen allopurinol (ZYLOPRIM) 100 MG tablet Take 100 mg  by mouth daily as needed (gout pain).       Marland Kitchen amitriptyline (ELAVIL) 50 MG tablet Take 1 tablet (50 mg total) by mouth daily.  30 tablet  5  . amLODipine (NORVASC) 10 MG tablet Take 1 tablet (10 mg total) by mouth daily.  90 tablet  1  . aspirin 81 MG chewable tablet Chew 162 mg by mouth daily as needed for fever.      . benzonatate (TESSALON) 100 MG capsule Take 1 capsule (100 mg total) by mouth 3 (three) times daily as needed for cough.  20 capsule  0  . feeding supplement, ENSURE COMPLETE, (ENSURE COMPLETE) LIQD Take 237 mLs by mouth 3 (three) times daily between meals.  90 Bottle  0  . ferrous sulfate 325 (65 FE) MG tablet Take 1 tablet (325 mg total) by mouth 2 (two) times daily with a meal.  60 tablet  3  . fluticasone (FLONASE) 50 MCG/ACT nasal spray Place 2 sprays into both nostrils daily.  16 g  6  . hydrochlorothiazide (MICROZIDE) 12.5 MG capsule Take 1 capsule (12.5 mg total) by mouth daily.  30 capsule  5  . HYDROmorphone (DILAUDID) 2 MG tablet Take 1 tablet (2 mg total) by mouth every 4 (four) hours as needed for severe pain.  30 tablet  0  . latanoprost (XALATAN) 0.005 % ophthalmic solution Place 1 drop into both eyes at bedtime.       Marland Kitchen  nicotine (NICODERM CQ - DOSED IN MG/24 HOURS) 21 mg/24hr patch Place 1 patch (21 mg total) onto the skin daily.  28 patch  0  . piperacillin-tazobactam (ZOSYN) 3.375 GM/50ML IVPB Inject 50 mLs (3.375 g total) into the vein every 8 (eight) hours.  50 mL  0  . polyethylene glycol (MIRALAX / GLYCOLAX) packet Take 17 g by mouth daily.  14 each  0  . PROAIR HFA 108 (90 BASE) MCG/ACT inhaler Inhale 2 puffs into the lungs every 4 (four) hours as needed for wheezing or shortness of breath.       . tamsulosin (FLOMAX) 0.4 MG CAPS capsule Take 1 capsule (0.4 mg total) by mouth daily after supper.  30 capsule  0  . valACYclovir (VALTREX) 1000 MG tablet Take 1 tablet (1,000 mg total) by mouth daily.  30 tablet  11  . vancomycin (VANCOCIN) 1 GM/200ML SOLN Inject  200 mLs (1,000 mg total) into the vein daily.  4000 mL  0  . voriconazole (VFEND) 200 MG tablet Take 1 tablet (200 mg total) by mouth every 12 (twelve) hours.  60 tablet  0   No current facility-administered medications on file prior to visit.   Active Ambulatory Problems    Diagnosis Date Noted  . Hypertension 12/05/2010  . Peripheral vascular disease 12/05/2010  . Glaucoma   . Asthma, intrinsic   . H/O alcohol abuse   . Aorto-iliac disease 09/18/2011  . 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 Atherosclerosis of native arteries of the extremities with intermittent claudication 03/20/2012  . Pain in limb 06/05/2012  . Pre-operative cardiovascular examination 08/21/2012  . PVD (peripheral vascular disease) 08/21/2012  . Smoker 10/29/2012  . History of lung cancer 10/29/2012  . Prostate cancer 04/28/2013  . Chronic cholecystitis with calculus 07/20/2013  . Poor compliance with fair medical history recall 07/20/2013  . Colon polyps 12/29/2011  . Hiatal hernia 07/20/2013  . Pulmonary infiltrates 07/29/2013  . Necrotizing pneumonia 10/03/2013  . Hospital acquired PNA 10/03/2013  . Protein-calorie malnutrition, severe 10/04/2013  . CKD (chronic kidney disease) stage 3, GFR 30-59 ml/min 10/04/2013  . History of lung abscess 10/05/2013  . Sepsis 10/06/2013  . Anemia, iron deficiency 10/09/2013  . Aspergillus pneumonia 10/12/2013  . Chronic airway obstruction, not elsewhere classified 10/14/2013   Resolved Ambulatory Problems    Diagnosis Date Noted  . Ulcer   . Heme positive stool 12/14/2012   Past Medical History  Diagnosis Date  . Pancreatitis   . Glaucoma   . COPD (chronic obstructive pulmonary disease)   . History of tobacco abuse   . PONV (postoperative nausea and vomiting)   . Anemia   . GERD (gastroesophageal reflux disease)   . Carpal tunnel syndrome, bilateral   . Prostate carcinoma   . Lung cancer, upper lobe 2007     History    Substance Use Topics  . Smoking status: Former Smoker -- 0.10 packs/day for 55 years  . Smokeless tobacco: Never Used     Comment:    . Alcohol Use: No     Comment: in the past  family history includes Alcohol abuse in his father; Cancer in his brother.  Review of Systems + weight loss, + productive cough + fatigue, 10 point ros is otherwise negative    Objective:   Physical Exam BP 103/67  Pulse 111  Temp(Src) 98.7 F (37.1 C) (Oral) Physical Exam  Constitutional: He is oriented to person, place, and time. He  appears well-developed and well-nourished. No distress. Thin/cachetic male HENT: bitemporal wasting Mouth/Throat: Oropharynx is clear and moist. No oropharyngeal exudate.  Cardiovascular: Normal rate, regular rhythm and normal heart sounds. Exam reveals no gallop and no friction rub.  No murmur heard.  Pulmonary/Chest: Effort normal and breath sounds normal. No respiratory distress. He has no wheezes. Decrease BS on left side Abdominal: Soft. Bowel sounds are normal. He exhibits no distension. There is no tenderness.  Lymphadenopathy:  He has no cervical adenopathy.  Neurological: He is alert and oriented to person, place, and time.  Skin: Skin is warm and dry. No rash noted. No erythema.  Psychiatric: He has a normal mood and affect. His behavior is normal.     FINDINGS:  Postoperative right partial pneumonectomy with surgical clips in the  right axilla. Diffuse pulmonary hyperinflation consistent with  emphysema. Scarring in the apices. The left lung demonstrates a  cavitary lesion with an air-fluid level in the upper lung region,  measuring about 5 cm diameter. There is diffuse surrounding airspace  and interstitial infiltration throughout the left upper lung,  lingula, and superior segment of left lower lung. Left apical gas  collection could represent pre-existing emphysematous bulla. No  pleural effusion. Changes are consistent with infectious or  inflammatory  process and could represent necrotizing pneumonia or  atypical pneumonia with lung abscess. Airways appear patent. No  pneumothorax. Normal heart size. Calcification in the aorta.  Esophagus is decompressed. No significant lymphadenopathy is noted  in the chest.  Included portions of the upper abdominal organs demonstrate  calcifications in the pancreas extensive vascular calcifications in  the upper abdomen. Postoperative changes in the cervical spine.  IMPRESSION:  Extensive infiltrative/inflammatory process in the left lung with  large cavitary process with air-fluid level in the left upper lung.  Changes likely to represent pneumonia or atypical pneumonia with  cavitation due to abscess or necrotizing pneumonia. Followup after  resolution of acute process is recommended to exclude underlying  cavitary mass lesion.      Assessment & Plan:   pulmonary abscess = continue on vancomycin and piptazo until 9/30. After which will change him to augmentin 875mg  BID x 3 wk. 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 Will check cmp today.  Protein-calorie malnutrition, severe = still not gained back weight that he lost from illness. We will ask him to have SNF give ensure 2 cans bid.  rtc in 3 wk

## 2013-10-19 NOTE — Telephone Encounter (Signed)
[facility] 

## 2013-10-19 NOTE — Addendum Note (Signed)
Addended by: Reggy Eye on: 10/19/2013 02:01 PM   Modules accepted: Orders

## 2013-10-20 ENCOUNTER — Non-Acute Institutional Stay (SKILLED_NURSING_FACILITY): Payer: Medicare Other | Admitting: Internal Medicine

## 2013-10-20 ENCOUNTER — Inpatient Hospital Stay: Payer: Medicare Other | Admitting: Internal Medicine

## 2013-10-20 DIAGNOSIS — I150 Renovascular hypertension: Secondary | ICD-10-CM

## 2013-10-20 DIAGNOSIS — N039 Chronic nephritic syndrome with unspecified morphologic changes: Secondary | ICD-10-CM

## 2013-10-20 DIAGNOSIS — D631 Anemia in chronic kidney disease: Secondary | ICD-10-CM

## 2013-10-20 DIAGNOSIS — K644 Residual hemorrhoidal skin tags: Secondary | ICD-10-CM

## 2013-10-20 DIAGNOSIS — N179 Acute kidney failure, unspecified: Secondary | ICD-10-CM

## 2013-10-20 LAB — COMPLETE METABOLIC PANEL WITH GFR
ALBUMIN: 2.9 g/dL — AB (ref 3.5–5.2)
ALK PHOS: 99 U/L (ref 39–117)
ALT: <8 U/L (ref 0–53)
AST: 14 U/L (ref 0–37)
BILIRUBIN TOTAL: 0.3 mg/dL (ref 0.2–1.2)
BUN: 16 mg/dL (ref 6–23)
CO2: 24 mEq/L (ref 19–32)
CREATININE: 1.83 mg/dL — AB (ref 0.50–1.35)
Calcium: 9.2 mg/dL (ref 8.4–10.5)
Chloride: 98 mEq/L (ref 96–112)
GFR, EST NON AFRICAN AMERICAN: 38 mL/min — AB
GFR, Est African American: 44 mL/min — ABNORMAL LOW
GLUCOSE: 99 mg/dL (ref 70–99)
Potassium: 4.3 mEq/L (ref 3.5–5.3)
Sodium: 136 mEq/L (ref 135–145)
Total Protein: 7.9 g/dL (ref 6.0–8.3)

## 2013-10-20 NOTE — Progress Notes (Signed)
Patient ID: [name] [name], male   DOB: September 22, 1945, 68 y.o.   MRN: [id number]           PROGRESS NOTE  DATE: 10/15/2013           FACILITY:  [facility]  LEVEL OF CARE: SNF (31)  Acute Visit  CHIEF COMPLAINT:  Manage chronic kidney disease stage III, anemia of chronic kidney disease, and leukocytosis.     HISTORY OF PRESENT ILLNESS: I was requested by the staff to assess the patient regarding above problem(s):  CHRONIC KIDNEY DISEASE: The patient's chronic kidney disease is unstable.  Patient denies increasing lower extremity swelling or confusion. Last BUN and creatinine are:   On 10/14/2013:  BUN 9, creatinine 1.63.  On 10/03/2013:  BUN 21, creatinine 1.52.    ANEMIA: The anemia is unstable. The patient denies fatigue, melena or hematochezia. No complications from the medications currently being used.  On 10/14/2013:  Hemoglobin 8.3, MCV 84.  On 10/03/2013:  Hemoglobin 9.3.    LEUKOCYTOSIS:  Worsening problem.  On 10/14/2013:  WBC 17.5, ANC 11.6.  On 10/03/2013:  WBC 14.2.  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  He denies increasing shortness of breath, chest pain, fever, chills or night sweats.  PAST MEDICAL HISTORY : Reviewed.  No changes/see problem list  CURRENT MEDICATIONS: Reviewed per MAR/see medication list  REVIEW OF SYSTEMS:  GENERAL: no change in appetite, no fatigue, no weight changes, no fever, chills or weakness RESPIRATORY: no cough, SOB, DOE,, wheezing, hemoptysis CARDIAC: no chest pain, edema or palpitations GI: no abdominal pain, diarrhea, constipation, heart burn, nausea or vomiting  PHYSICAL EXAMINATION  VS: see VS section  GENERAL: no acute distress, normal body habitus EYES: conjunctivae normal, sclerae normal, normal eye lids NECK: supple, trachea midline, no neck masses, no thyroid tenderness, no thyromegaly LYMPHATICS: no LAN in the neck, no supraclavicular  LAN RESPIRATORY: breathing is even & unlabored, BS CTAB CARDIAC: RRR, no murmur,no extra heart sounds, no edema GI: abdomen soft, normal BS, no masses, no tenderness, no hepatomegaly, no splenomegaly PSYCHIATRIC: the patient is alert & oriented to person, affect & behavior appropriate  LABS/RADIOLOGY:    10/14/2013:  Platelet count 644.  Glucose 135.    10/03/2013:  Platelet count 465.  Glucose 126.    ASSESSMENT/PLAN:    Chronic kidney disease stage III.  Unstable problem.  Renal functions are worsening.  Recheck.    Anemia of chronic kidney disease.  Unstable problem.  Hemoglobin has declined.  Recheck.    Leukocytosis with left shift.  Worsening problem, but patient is asymptomatic.   Recheck.    Hyperglycemia.  New problem.  Patient is not diabetic and he is not on prednisone.  Check fasting glucose and hemoglobin A1c.    Thrombocytosis.  Worsening problem.  Acute phase reactant.  Recheck.    CPT CODE: 41660             Gayani Y Dasanayaka, [place] [phone]

## 2013-10-21 DIAGNOSIS — D631 Anemia in chronic kidney disease: Secondary | ICD-10-CM | POA: Insufficient documentation

## 2013-10-21 DIAGNOSIS — N189 Chronic kidney disease, unspecified: Secondary | ICD-10-CM

## 2013-10-23 ENCOUNTER — Non-Acute Institutional Stay (SKILLED_NURSING_FACILITY): Payer: Medicare Other | Admitting: Internal Medicine

## 2013-10-23 DIAGNOSIS — B449 Aspergillosis, unspecified: Secondary | ICD-10-CM

## 2013-10-23 DIAGNOSIS — J850 Gangrene and necrosis of lung: Secondary | ICD-10-CM

## 2013-10-23 DIAGNOSIS — J852 Abscess of lung without pneumonia: Secondary | ICD-10-CM

## 2013-10-23 NOTE — Progress Notes (Signed)
Patient ID: [name] [name], male   DOB: 12-04-1945, 68 y.o.   MRN: [id number] Facility; [facility] Chief complaint; followup necrotizing pneumonia in the left upper lobe, vancomycin dosing error History a nurse brought her to my attention yesterday that there had been an error in the vancomycin dose on this patient. I am here today reviewing the situation. This is a man with a necrotizing left upper lobe pneumonia. He had a bronchoscopy in August. 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 Review of the microbiology on  link shows negative acid-fast bacilli cultures, negative fungal cultures, blood cultures negative. His pathology apparently showed fungal hyphae. He has a history of lung cancer in the right upper lobe resected in 2007. Also COPD.   He arrived in the facility on 9 mycin 750 mg IV twice a day , voriconazole 200 mg orally every 12, and Zosyn 3.375 g IV every 8 H. he also had his hemoglobin drop in the hospital to 7.7. He is apparently going to have 3 weeks of IV antibiotics. He is followed with infectious disease since his arrival here. His trough vancomycin level on 9/23 was 18.9. There is an order on 9/25 to reduce the vancomycin to 500 IV every 12. Although from what I understand this was 500 IV daily until this was corrected yesterday. A followup trough level is ordered for 5/27 now that he is on the correct dose of 500 IV every 12. Both the vancomycin and Zosyn are to continue to 9/30 and then he is to switch to Augmentin. I will take care of these orders. He does have chronic renal insufficiency however this appears to be stable with lab work from 9/25 showing a BUN of 13 a creatinine of 1.53  The patient states he feels well with only occasional cough occasionally productive of clear sputum he has no fever no chills no chest pain. States his appetite is improving. He does not feel short of  breath  Physical examination Gen. the patient is sitting up eating lunch and appears to have a very good appetite and oral intake Vitals; O2 sat is 99% on room air respirations 18 and unlabored. Pulse rate 77 Respiratory; there is some decreased air entry over the left lower lobe as compared with the right but really clear air entry without crackles or wheezes. 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 I don't think he vancomycin dosing her we'll really affect anything currently. He appears to be very well. #2 chronic renal insufficiency this appears to be stable as well. #3 protein calorie malnutrition listed as severe. He is oral intake appears to be picking up quite nicely at least as observed at lunch today.      CLINICAL DATA:  Fever for 2 weeks. Diagnosed with fungal infection of lungs 2 weeks ago. Previous history of lung cancer with right lung resection. Cough. 30 lb weight loss over 1 month.   EXAM: CT CHEST WITHOUT CONTRAST   TECHNIQUE: Multidetector CT imaging of the chest was performed following the standard protocol without IV contrast.   COMPARISON:  12/05/2010   FINDINGS: Postoperative right partial pneumonectomy with surgical clips in the right axilla. Diffuse pulmonary hyperinflation consistent with emphysema. Scarring in the apices. The left lung demonstrates a cavitary lesion with an air-fluid level  in the upper lung region, measuring about 5 cm diameter. There is diffuse surrounding airspace and interstitial infiltration throughout the left upper lung, lingula, and superior segment of left lower lung. Left apical gas collection could represent pre-existing emphysematous bulla. No pleural effusion. Changes are consistent with infectious or inflammatory process and could represent necrotizing  pneumonia or atypical pneumonia with lung abscess. Airways appear patent. No pneumothorax. Normal heart size. Calcification in the aorta. Esophagus is decompressed. No significant lymphadenopathy is noted in the chest.   Included portions of the upper abdominal organs demonstrate calcifications in the pancreas extensive vascular calcifications in the upper abdomen. Postoperative changes in the cervical spine.   IMPRESSION: Extensive infiltrative/inflammatory process in the left lung with large cavitary process with air-fluid level in the left upper lung. Changes likely to represent pneumonia or atypical pneumonia with cavitation due to abscess or necrotizing pneumonia. Followup after resolution of acute process is recommended to exclude underlying cavitary mass lesion.     Electronically Signed     Results for NATALIE, LECLAIRE (MRN [id number]) as of 10/23/2013 12:02  Ref. Range 10/10/2013 21:00 10/11/2013 05:30 10/11/2013 09:30 10/11/2013 13:15 10/19/2013 16:35  Sodium Latest Range: 135-145 mEq/L  139   136  Potassium Latest Range: 3.5-5.3 mEq/L  3.7   4.3  Chloride Latest Range: 96-112 mEq/L  101   98  CO2 Latest Range: 19-32 mEq/L  25   24  BUN Latest Range: 6-23 mg/dL  8   16  Creatinine Latest Range: 0.50-1.35 mg/dL  1.47 (H)   1.83 (H)  Calcium Latest Range: 8.4-10.5 mg/dL  8.7   9.2  GFR calc non Af Amer Latest Range: >90 mL/min  48 (L)     GFR calc Af Amer Latest Range: >90 mL/min  56 (L)     Glucose Latest Range: 70-99 mg/dL  95   99  Anion gap Latest Range: 5-15   13     Alkaline Phosphatase Latest Range: 39-117 U/L     99  Albumin Latest Range: 3.5-5.2 g/dL     2.9 (L)  AST Latest Range: 0-37 U/L     14  ALT Latest Range: 0-53 U/L     <8  Total Protein Latest Range: 6.0-8.3 g/dL     7.9  Total Bilirubin Latest Range: 0.2-1.2 mg/dL     0.3  GFR, Est African American No range found     44 (L)  GFR, Est Non African American No range found     38 (L)  Copper Latest Range:  70-175 mcg/dL    153   Zinc Latest Range: 60-130 mcg/dL    59 (L)   WBC Latest Range: 4.0-10.5 K/uL  15.6 (H)   16.2 (H)  RBC Latest Range: 4.22-5.81 MIL/uL  2.94 (L)   3.96 (L)  Hemoglobin Latest Range: 13.0-17.0 g/dL  7.9 (L)   10.4 (L)  HCT Latest Range: 39.0-52.0 %  22.8 (L)   32.6 (L)  MCV Latest Range: 78.0-100.0 fL  77.6 (L)   82.3  MCH Latest Range: 26.0-34.0 pg  26.9   26.3  MCHC Latest Range: 30.0-36.0 g/dL  34.6   31.9  RDW Latest Range: 11.5-15.5 %  18.4 (H)   19.7 (H)  Platelets Latest Range: 150-400 K/uL  490 (H)   746 (H)  Neutrophils Relative % Latest Range: 43-77 %     70  Lymphocytes Relative Latest Range: 12-46 %     11 (L)  Monocytes Relative Latest Range: 3-12 %  8  Eosinophils Relative Latest Range: 0-5 %     11 (H)  Basophils Relative Latest Range: 0-1 %     0  NEUT# Latest Range: 1.7-7.7 K/uL     11.3 (H)  Lymphocytes Absolute Latest Range: 0.7-4.0 K/uL     1.8  Monocytes Absolute Latest Range: 0.1-1.0 K/uL     1.3 (H)  Eosinophils Absolute Latest Range: 0.0-0.5 10e3/uL     1.8 (H)  Basophils Absolute Latest Range: 0.0-0.1 K/uL     0.0  Smear Review No range found     Criteria for review not met  Vancomycin Rm No range found   14.3    Vancomycin Tr Latest Range: 10.0-20.0 ug/mL 24.9 (H)

## 2013-10-25 ENCOUNTER — Non-Acute Institutional Stay (SKILLED_NURSING_FACILITY): Payer: Medicare Other | Admitting: Internal Medicine

## 2013-10-25 DIAGNOSIS — N183 Chronic kidney disease, stage 3 unspecified: Secondary | ICD-10-CM

## 2013-10-25 DIAGNOSIS — I150 Renovascular hypertension: Secondary | ICD-10-CM | POA: Insufficient documentation

## 2013-10-25 NOTE — Progress Notes (Addendum)
Patient ID: [name] [name], male   DOB: [birth year], 68 y.o.   MRN: [id number]           PROGRESS NOTE  DATE: 10/20/2013        FACILITY:  [facility]  LEVEL OF CARE: SNF (31)  Acute Visit  CHIEF COMPLAINT:  Manage acute renal failure, hemorrhoids, and hypertension.    HISTORY OF PRESENT ILLNESS: I was requested by the staff to assess the patient regarding above problem(s):  ACUTE RENAL FAILURE:  Worsening problem.   On 10/19/2013:  BUN 16, creatinine 1.34.   On 10/18/2013:  BUN 10, creatinine 1.49.  Patient is currently on vancomycin and Zosyn.  He denies confusion or lower extremity swelling.    HTN: Pt 's HTN is unstable.  Denies CP, sob, DOE, pedal edema, headaches, dizziness or visual disturbances.  No complications from the medications currently being used.  Last BP :   150/80.    HEMORRHOIDS:  Patient is complaining of hemorrhoids that are painful.  He denies any bleeding.    PAST MEDICAL HISTORY : Reviewed.  No changes/see problem list  CURRENT MEDICATIONS: Reviewed per MAR/see medication list  REVIEW OF SYSTEMS:  GENERAL: no change in appetite, no fatigue, no weight changes, no fever, chills or weakness RESPIRATORY: no cough, SOB, DOE,, wheezing, hemoptysis CARDIAC: no chest pain, edema or palpitations GI: no abdominal pain, diarrhea, constipation, heart burn, nausea or vomiting  PHYSICAL EXAMINATION  VS: see VS section  GENERAL: no acute distress, normal body habitus EYES: conjunctivae normal, sclerae normal, normal eye lids NECK: supple, trachea midline, no neck masses, no thyroid tenderness, no thyromegaly LYMPHATICS: no LAN in the neck, no supraclavicular LAN RESPIRATORY: breathing is even & unlabored, BS CTAB CARDIAC: RRR, no murmur,no extra heart sounds, no edema GI: abdomen soft, normal BS, no masses, no tenderness, no hepatomegaly, no splenomegaly RECTAL:  On anal exam, there are external hemorrhoids protruding out but no bleeding, open  skin, or discharge noted       PSYCHIATRIC: the patient is alert & oriented to person, affect & behavior appropriate  LABS/RADIOLOGY:  10/19/2013:  WBC 17.1, hemoglobin 10.4, MCV 83, platelets 747.     10/14/2013:  WBC 17.5, hemoglobin 8.3, platelets 644.    ASSESSMENT/PLAN:  Acute renal failure.  Unstable problem.  Renal functions are increasing.  Vancomycin dose per pharmacy protocol and recheck renal functions pending on 10/22/2013.     Hypertension.  Blood pressure uncontrolled.  Start metoprolol 12.5 mg b.i.d.    External hemorrhoids.  Ongoing problem.  Start Anusol cream q.d.   Offered General Surgery consult.  The patient declined.    Anemia of chronic kidney disease.  Hemoglobin improved.    Leukocytosis with left shift.  Stable problem.  Likely secondary to necrotizing pneumonia and aspergillus pneumonia.  We will recheck on 10/25/2013.    Thrombocytosis.  Acute phase reactant.   Unstable problem.  Platelet count has increased.  We will monitor.  Patient is asymptomatic.    CPT CODE: 71219           [name] [name] Y [name] [name], [name] [name] [phone]

## 2013-10-27 ENCOUNTER — Non-Acute Institutional Stay (SKILLED_NURSING_FACILITY): Payer: Medicare Other | Admitting: Internal Medicine

## 2013-10-27 DIAGNOSIS — N183 Chronic kidney disease, stage 3 unspecified: Secondary | ICD-10-CM

## 2013-10-27 DIAGNOSIS — R799 Abnormal finding of blood chemistry, unspecified: Secondary | ICD-10-CM

## 2013-10-27 DIAGNOSIS — N189 Chronic kidney disease, unspecified: Secondary | ICD-10-CM

## 2013-10-27 DIAGNOSIS — R7989 Other specified abnormal findings of blood chemistry: Secondary | ICD-10-CM

## 2013-10-27 DIAGNOSIS — D631 Anemia in chronic kidney disease: Secondary | ICD-10-CM

## 2013-10-27 DIAGNOSIS — D75838 Other thrombocytosis: Secondary | ICD-10-CM

## 2013-10-27 DIAGNOSIS — N039 Chronic nephritic syndrome with unspecified morphologic changes: Secondary | ICD-10-CM

## 2013-10-27 DIAGNOSIS — D72829 Elevated white blood cell count, unspecified: Secondary | ICD-10-CM

## 2013-10-27 NOTE — Progress Notes (Addendum)
Patient ID: [name] [name], male   DOB: 08/31/1945, 68 [name].o.   MRN: [id number]           PROGRESS NOTE  DATE: 10/25/2013           FACILITY:  [facility]  LEVEL OF CARE: SNF (31)  Acute Visit  CHIEF COMPLAINT:  Manage renal insufficiency.    HISTORY OF PRESENT ILLNESS: I was requested by the staff to assess the patient regarding above problem(s):  On 10/22/2013:  Patient's BUN was 13, creatinine 1.53.  On 10/24/2013:  BUN 17, creatinine 1.74.  On 10/19/2013:  BUN 17, creatinine 1.74.  Patient is currently receiving vancomycin, dosed by the pharmacy.  He denies increasing confusion or lower extremity swelling.    PAST MEDICAL HISTORY : Reviewed.  No changes/see problem list  CURRENT MEDICATIONS: Reviewed per MAR/see medication list  PHYSICAL EXAMINATION  VS: see VS section  GENERAL: no acute distress, thin body habitus RESPIRATORY: breathing is even & unlabored, BS CTAB CARDIAC: RRR, no murmur,no extra heart sounds, no edema  ASSESSMENT/PLAN:  CKD stage 111.  Renal functions are stable.  Pharmacy dosing the vancomycin and monitoring renal functions.    CPT CODE: 54492            [name] [name] [name] [name], [name] [name] [phone]

## 2013-10-28 ENCOUNTER — Telehealth: Payer: Self-pay

## 2013-10-28 ENCOUNTER — Non-Acute Institutional Stay (SKILLED_NURSING_FACILITY): Payer: Medicare Other | Admitting: Internal Medicine

## 2013-10-28 DIAGNOSIS — D631 Anemia in chronic kidney disease: Secondary | ICD-10-CM

## 2013-10-28 DIAGNOSIS — J850 Gangrene and necrosis of lung: Secondary | ICD-10-CM

## 2013-10-28 DIAGNOSIS — D72829 Elevated white blood cell count, unspecified: Secondary | ICD-10-CM

## 2013-10-28 DIAGNOSIS — B449 Aspergillosis, unspecified: Secondary | ICD-10-CM

## 2013-10-28 DIAGNOSIS — N189 Chronic kidney disease, unspecified: Secondary | ICD-10-CM

## 2013-10-28 NOTE — Telephone Encounter (Addendum)
Pt states that he is still at SNF.  Suppose to be discharged later today.  Will call patient back to complete TCM call.  Hospital follow up appointment rescheduled to 11/04/13 @ 11:30 am with Dr. Birdie Riddle.

## 2013-10-28 NOTE — Progress Notes (Addendum)
Patient ID: [name] [name], male   DOB: 07-Sep-1945, 68 y.o.   MRN: [id number]           PROGRESS NOTE  DATE: 10/27/2013         FACILITY:  [facility]  LEVEL OF CARE: SNF (31)  Acute Visit  CHIEF COMPLAINT:  Manage leukocytosis, renal failure, and anemia of chronic disease.    HISTORY OF PRESENT ILLNESS: I was requested by the staff to assess the patient regarding above problem(s):  LEUKOCYTOSIS:  On 10/25/2013:  WBC 14.3, ANC 8.8.  On 10/19/2013:  WBC 17.1.  The patient had necrotizing pneumonia and aspergillus pneumonia.    ANEMIA: The anemia is unstable. The patient denies fatigue, melena or hematochezia. No complications from the medications currently being used.   On 10/25/2013:  Hemoglobin 9.5, MCV 82.  On 10/19/2013:  Hemoglobin 10.4.    RENAL INSUFFICIENCY:  On 10/25/2013:  BUN 18, creatinine 1.42.  On 10/24/2013:  BUN 17, creatinine 1.74.    PAST MEDICAL HISTORY : Reviewed.  No changes/see problem list  CURRENT MEDICATIONS: Reviewed per MAR/see medication list  REVIEW OF SYSTEMS:  GENERAL: no change in appetite, no fatigue, no weight changes, no fever, chills or weakness RESPIRATORY: no cough, SOB, DOE,, wheezing, hemoptysis CARDIAC: no chest pain, edema or palpitations GI: no abdominal pain, diarrhea, constipation, heart burn, nausea or vomiting  PHYSICAL EXAMINATION  VS: see VS section  GENERAL: no acute distress, normal body habitus EYES: conjunctivae normal, sclerae normal, normal eye lids NECK: supple, trachea midline, no neck masses, no thyroid tenderness, no thyromegaly LYMPHATICS: no LAN in the neck, no supraclavicular LAN RESPIRATORY: breathing is even & unlabored, BS CTAB CARDIAC: RRR, no murmur,no extra heart sounds, no edema GI: abdomen soft, normal BS, no masses, no tenderness, no hepatomegaly, no splenomegaly PSYCHIATRIC: the patient is alert & oriented to person, affect & behavior appropriate  LABS/RADIOLOGY:    On 10/25/2013:   Platelets 633.     On 10/19/2013:  Platelets 747.    ASSESSMENT/PLAN:    Anemia of chronic kidney disease.  Unstable problem.  Hemoglobin declined.  We will follow up next week.    Leukocytosis with left shift.  Improving.  Continue current antibiotics.    CKD stage 111.  Improved.  Monitor for vancomycin use.    Reactive Thrombocytosis.  Acute phase reactant.  Counts improved.    CPT CODE: 06237           Gayani Y Dasanayaka, [place] [phone]

## 2013-10-29 ENCOUNTER — Ambulatory Visit: Payer: Medicare Other | Admitting: Family Medicine

## 2013-10-31 LAB — AFB CULTURE WITH SMEAR (NOT AT ARMC)
Acid Fast Smear: NONE SEEN
SPECIAL REQUESTS: NORMAL

## 2013-11-01 LAB — FUNGUS CULTURE W SMEAR
FUNGAL SMEAR: NONE SEEN
Special Requests: NORMAL

## 2013-11-01 NOTE — Progress Notes (Addendum)
Patient ID: [name] [name], male   DOB: [birth year], 68 y.o.   MRN: [id number]               PROGRESS NOTE  DATE:  10/28/2013      FAC[facility]: [facility]    LEVEL OF CARE:   SNF   Acute Visit/Discharge Visit       CHIEF COMPLAINT:  Pre-discharge review.     HISTORY OF PRESENT ILLNESS:  This is a patient who had a necrotizing pneumonia in the left upper lobe.  He had a bronchoscopy in August.  This ultimately was felt to represent pulmonary aspergillosis, although I do not believe there has actually been a positive culture.    Review of microbiology in [facility] shows nothing that would really be helpful other than he had negative acid-fast cultures, negative fungal cultures, and blood cultures negative.  A pathology report apparently showed fungal hyphae.    He came here for completion of vancomycin and Zosyn.  There are instructions found in his last Infectious Disease note (Dr. Baxter Flattery) for him to go home on Augmentin for a further three weeks.  He is also on Voriconazole 200 mg every 12 hours, which I am planning to continue.    The patient has baseline COPD, although he states he is not excessively short of breath.  He is ambulatory, independent, and going home.  He does not require home health or any DME.    PHYSICAL EXAMINATION:   GENERAL APPEARANCE:  The patient is in absolutely no distress.  He is afebrile.   CHEST/RESPIRATORY:  Good air entry in both upper lobes.   CARDIOVASCULAR:  CARDIAC:   Heart sounds are normal without any obvious difficulties.    ASSESSMENT/PLAN:  Cavitary left upper lobe pneumonia.  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  This appears to be stable.  He has completed his antibiotics.  Going home on Augmentin for three weeks, and Voriconazole.  He apparently has a follow-up with Dr. Baxter Flattery on 11/15/2013.    COPD.  This is stable.    Chronic renal failure.  This also appears to be stable.  Last checked on 10/25/2013 at 1.42.  This is better than  what it has been.    Leukocytosis.  This has been steadily coming down.  Last white count on 10/25/2013 was 14.3.  Differential count revealed a slight leukocytosis.  He also has thrombocytosis.  However, his last platelet count was 633,000 and is actually better.  No doubt, a reactive phenomenon.    The patient appears stable.  I have written orders, done his prescriptions, and reviewed his clinical status.   (greater than 30 minutes spent)

## 2013-11-01 NOTE — Telephone Encounter (Signed)
Since this pt is not mine and appears to be very complicated- please block my 11:00 same day appt and have pt come at 11.  That way we can get him checked in, meds reviewed, and I can start seeing him before my 11:15 pt.  Otherwise, given the complexity of his case and the fact that I am unfamiliar w/ all of his chronic medical problems, we will have to work through lunch.

## 2013-11-01 NOTE — Telephone Encounter (Signed)
Message forwarded to [employer], Admin Lead.  He blocked 11:00 am appt and spoke with patient.   Patient stated to Rachel Bo that he would try to come in at 37.  He's having to take public transportation.  He understands that the provider is requesting that he come in early.

## 2013-11-01 NOTE — Telephone Encounter (Signed)
Discharged from SNF:  10/28/13  Reason for admission: Sepsis-Necrotizing pneumonia and Aspergillus pneumonia   Recommendations for Outpatient Follow-up:  1. Discharge to SNF. Patient will be on IV vancomycin and zosyn for another 2 weeks ( ID will determine further antibiotic need during outpt follow up). i have placed stop date for abx as 10/27/2013 2. Patient will continue voriconazole ( may need for lifelong) 3. Please check CBC and CMP weekly while on antibiotics and antifungal.  4. Patient will follow up with hematologist Dr Lona Kettle as outpatient.   Transition Care Management Follow-up Telephone Call  How have you been since you were released from the hospital?  Has been feeling tired, weak, and nauseated since being discharged.  He feels like he is on too many medications. Medications reviewed.  No new medications identified.  Afebrile.  Encouraged patient to take blood pressure while on the phone.  Pt stated that machine was upstairs and he did not feel up to going upstairs to get it.  Pt was encouraged to eat and stay hydrated.  He was also encouraged to go back to ER if his symptoms were severe or worsen before appointment. Pt stated understanding.  He said that if he begins to feel worse, he will go back to the hospital.     Do you understand why you were in the hospital? yes   Do you understand the discharge instrcutions? yes  Items Reviewed:  Medications reviewed: yes, pt was encouraged to bring medications with him during visit.    Allergies reviewed: yes  Dietary changes reviewed: yes, regular with supplement Referrals reviewed: yes, Dr. Carollee Herter disease (has an appointment) and Dr. Dr Lona Kettle- hematologist (no appointment scheduled)   Functional Questionnaire:   Activities of Daily Living (ADLs):   He states they are independent in the following: ambulation, bathing and hygiene, feeding, continence, grooming, toileting and dressing States they require  assistance with the following: Friends come over to prepare meals for patient    Any transportation issues/concerns?: no, public transportation   Any patient concerns? no   Confirmed importance and date/time of follow-up visits scheduled: yes   Confirmed with patient if condition begins to worsen call PCP or go to the ER.  Patient was given the Call-a-Nurse line [phone]: yes   Hospital Follow Up Appointment scheduled with Dr. Birdie Riddle on 11/04/13 @ 11:30 am.

## 2013-11-02 ENCOUNTER — Telehealth: Payer: Self-pay | Admitting: *Deleted

## 2013-11-02 NOTE — Telephone Encounter (Signed)
Patient was released from [facility] 10/2.  He states he got his medication and started it on Sunday, 10/4.  He is unsure of which medications he is taking, but states he "feels like crap", has no energy, having changes in vision, increased coughing when he uses his breathing treatments, occasional shortness of breath. Patient has a follow up scheduled 10/19, but would like advice/a phone call before then.  Please advise.  Landis Gandy, RN

## 2013-11-03 NOTE — Telephone Encounter (Signed)
Please add him onto my schedule for tomorrow. Also please see if he is still on IV antibiotics and oral voriconazole.

## 2013-11-03 NOTE — Telephone Encounter (Signed)
He will see you tomorrow 3:45. Per discharge paperwork, patient is off IV antibiotics, taking voriconizole and augmentin. Patient wasn't sure of his meds.  Requested information from [facility].

## 2013-11-03 NOTE — Telephone Encounter (Signed)
I spoke with Mr. [name] [name] today by phone. Overall he is feeling much better. However when he picked up his new prescriptions after discharge from [facility] recently and started taking them he began to notice that his vision was blurry and he felt like his throat was closing up. I noted that he was started on amoxicillin clavulanate on discharge after his IV vancomycin and piperacillin tazobactam were stopped. He has continued on voriconazole. His new symptoms may be related to amoxicillin clavulanate. I asked him to bring all of his medications with him when he comes to clinic to see me tomorrow.

## 2013-11-04 ENCOUNTER — Ambulatory Visit (INDEPENDENT_AMBULATORY_CARE_PROVIDER_SITE_OTHER): Payer: PRIVATE HEALTH INSURANCE | Admitting: Family Medicine

## 2013-11-04 ENCOUNTER — Ambulatory Visit (INDEPENDENT_AMBULATORY_CARE_PROVIDER_SITE_OTHER): Payer: PRIVATE HEALTH INSURANCE | Admitting: Internal Medicine

## 2013-11-04 ENCOUNTER — Ambulatory Visit
Admission: RE | Admit: 2013-11-04 | Discharge: 2013-11-04 | Disposition: A | Discharge status: Home / Self Care | Payer: PRIVATE HEALTH INSURANCE | Source: Ambulatory Visit | Attending: Internal Medicine | Admitting: Internal Medicine

## 2013-11-04 ENCOUNTER — Encounter: Payer: Self-pay | Admitting: Internal Medicine

## 2013-11-04 ENCOUNTER — Encounter: Payer: Self-pay | Admitting: Family Medicine

## 2013-11-04 VITALS — BP 130/76 | HR 101 | Temp 98.4°F | Resp 17 | Wt 112.4 lb

## 2013-11-04 VITALS — BP 116/74 | HR 98 | Temp 98.2°F | Ht 66.5 in | Wt 116.5 lb

## 2013-11-04 DIAGNOSIS — Z23 Encounter for immunization: Secondary | ICD-10-CM

## 2013-11-04 DIAGNOSIS — D509 Iron deficiency anemia, unspecified: Secondary | ICD-10-CM

## 2013-11-04 DIAGNOSIS — N183 Chronic kidney disease, stage 3 unspecified: Secondary | ICD-10-CM

## 2013-11-04 DIAGNOSIS — M549 Dorsalgia, unspecified: Secondary | ICD-10-CM

## 2013-11-04 DIAGNOSIS — G8929 Other chronic pain: Secondary | ICD-10-CM | POA: Insufficient documentation

## 2013-11-04 DIAGNOSIS — J850 Gangrene and necrosis of lung: Secondary | ICD-10-CM

## 2013-11-04 LAB — CBC WITH DIFFERENTIAL/PLATELET
Basophils Absolute: 0.2 10*3/uL — ABNORMAL HIGH (ref 0.0–0.1)
Basophils Relative: 1.1 % (ref 0.0–3.0)
EOS ABS: 2.1 10*3/uL — AB (ref 0.0–0.7)
Eosinophils Relative: 12.4 % — ABNORMAL HIGH (ref 0.0–5.0)
HCT: 34 % — ABNORMAL LOW (ref 39.0–52.0)
HEMOGLOBIN: 10.8 g/dL — AB (ref 13.0–17.0)
LYMPHS ABS: 2.7 10*3/uL (ref 0.7–4.0)
LYMPHS PCT: 15.6 % (ref 12.0–46.0)
MCHC: 31.8 g/dL (ref 30.0–36.0)
MCV: 84.4 fl (ref 78.0–100.0)
MONO ABS: 1.3 10*3/uL — AB (ref 0.1–1.0)
Monocytes Relative: 7.7 % (ref 3.0–12.0)
NEUTROS ABS: 10.8 10*3/uL — AB (ref 1.4–7.7)
Neutrophils Relative %: 63.2 % (ref 43.0–77.0)
Platelets: 414 10*3/uL — ABNORMAL HIGH (ref 150.0–400.0)
RBC: 4.04 Mil/uL — ABNORMAL LOW (ref 4.22–5.81)
RDW: 21.5 % — AB (ref 11.5–15.5)
WBC: 17.1 10*3/uL — ABNORMAL HIGH (ref 4.0–10.5)

## 2013-11-04 LAB — BASIC METABOLIC PANEL
BUN: 42 mg/dL — ABNORMAL HIGH (ref 6–23)
CALCIUM: 10 mg/dL (ref 8.4–10.5)
CHLORIDE: 100 meq/L (ref 96–112)
CO2: 24 meq/L (ref 19–32)
Creatinine, Ser: 1.9 mg/dL — ABNORMAL HIGH (ref 0.4–1.5)
GFR: 45.84 mL/min — ABNORMAL LOW (ref >60.00)
Glucose, Bld: 88 mg/dL (ref 70–99)
Potassium: 4.1 mEq/L (ref 3.5–5.1)
Sodium: 137 mEq/L (ref 135–145)

## 2013-11-04 LAB — HEPATIC FUNCTION PANEL
ALK PHOS: 103 U/L (ref 39–117)
ALT: 9 U/L (ref 0–53)
AST: 14 U/L (ref 0–37)
Albumin: 2.8 g/dL — ABNORMAL LOW (ref 3.5–5.2)
BILIRUBIN DIRECT: 0 mg/dL (ref 0.0–0.3)
TOTAL PROTEIN: 9.5 g/dL — AB (ref 6.0–8.3)
Total Bilirubin: 0.4 mg/dL (ref 0.2–1.2)

## 2013-11-04 MED ORDER — HYDROCODONE-ACETAMINOPHEN 7.5-325 MG PO TABS: 1.0000 | ORAL_TABLET | Freq: Four times a day (QID) | ORAL | Status: DC | PRN

## 2013-11-04 NOTE — Assessment & Plan Note (Signed)
Pt reports this is ongoing issue for him and he is maintained on hydrocodone by his PCP.  Refill on script given in provider's absence.

## 2013-11-04 NOTE — Patient Instructions (Signed)
Follow up w/ Dr Bridget Hartshorn later today [facility] check on your kidney specialist referral We'll notify you of your lab results and make any changes if needed Continue your antibiotics until told differently by Dr Megan Salon Call with any questions or concerns Hang in there!!

## 2013-11-04 NOTE — Assessment & Plan Note (Signed)
New to provider.  On Augmentin and Voriconazole as directed by ID.  Has appt w/ them later this afternoon.  Will follow.

## 2013-11-04 NOTE — Progress Notes (Signed)
Pre visit review using our clinic review tool, if applicable. No additional management support is needed unless otherwise documented below in the visit note. 

## 2013-11-04 NOTE — Progress Notes (Addendum)
Patient ID: [name] [name], male   DOB: May 03, 1945, 68 y.o.   MRN: [id number]         [facility] for Infectious Disease  Patient Active Problem List   Diagnosis Date Noted  . 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 Necrotizing pneumonia 10/03/2013    Priority: High  . Chronic back pain 11/04/2013  . Secondary renovascular hypertension, benign 10/25/2013  . Anemia in chronic renal disease 10/21/2013  . Chronic airway obstruction, not elsewhere classified 10/14/2013  . History of lung abscess 10/05/2013  . Protein-calorie malnutrition, severe 10/04/2013  . CKD (chronic kidney disease) stage 3, GFR 30-59 ml/min 10/04/2013  . Chronic cholecystitis with calculus 07/20/2013  . Poor compliance with fair medical history recall 07/20/2013  . Hiatal hernia 07/20/2013  . Prostate cancer 04/28/2013  . Smoker 10/29/2012  . History of lung cancer 10/29/2012  . Pre-operative cardiovascular examination 08/21/2012  . PVD (peripheral vascular disease) 08/21/2012  . Pain in limb 06/05/2012  . Atherosclerosis of native arteries of the extremities with intermittent claudication 03/20/2012  . Colon polyps 12/29/2011  . 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 Aorto-iliac disease 09/18/2011  . Hypertension 12/05/2010  . Peripheral vascular disease 12/05/2010  . Glaucoma   . Asthma, intrinsic   . H/O alcohol abuse     Patient's Medications  New Prescriptions   No medications on file  Previous Medications   ALLOPURINOL (ZYLOPRIM) 100 MG TABLET    Take 100 mg by mouth daily as needed (gout pain).    AMITRIPTYLINE (ELAVIL) 50 MG TABLET    Take 1 tablet (50 mg total) by mouth daily.   AMLODIPINE (NORVASC) 10 MG TABLET    Take 1 tablet (10 mg total) by mouth daily.   ASPIRIN 81 MG CHEWABLE TABLET    Chew 162 mg by mouth daily as needed for fever.   FEEDING SUPPLEMENT, ENSURE COMPLETE, (ENSURE COMPLETE) LIQD    Take 237 mLs by mouth 3 (three) times  daily between meals.   FERROUS SULFATE 325 (65 FE) MG TABLET    Take 1 tablet (325 mg total) by mouth 2 (two) times daily with a meal.   FLUTICASONE (FLONASE) 50 MCG/ACT NASAL SPRAY    Place 2 sprays into both nostrils daily.   HYDROCHLOROTHIAZIDE (MICROZIDE) 12.5 MG CAPSULE    Take 1 capsule (12.5 mg total) by mouth daily.   HYDROCODONE-ACETAMINOPHEN (NORCO) 7.5-325 MG PER TABLET    Take 1-2 tablets by mouth every 6 (six) hours as needed for moderate pain. Take 1-2 tablets every 4 hours as needed.   LATANOPROST (XALATAN) 0.005 % OPHTHALMIC SOLUTION    Place 1 drop into both eyes at bedtime.    PROAIR HFA 108 (90 BASE) MCG/ACT INHALER    Inhale 2 puffs into the lungs every 4 (four) hours as needed for wheezing or shortness of breath.    TAMSULOSIN (FLOMAX) 0.4 MG CAPS CAPSULE    Take 1 capsule (0.4 mg total) by mouth daily after supper.   VALACYCLOVIR (VALTREX) 1000 MG TABLET    Take 1 tablet (1,000 mg total) by mouth daily.   VORICONAZOLE (VFEND) 200 MG TABLET    Take 1 tablet (200 mg total) by mouth every 12 (twelve) hours.  Modified Medications   No medications on file  Discontinued Medications   AMOXICILLIN-CLAVULANATE (AUGMENTIN) 875-125 MG PER TABLET    Take 1 tablet by mouth 2 (two) times daily.    Subjective: [name] [name] is in  for his hospital followup visit. He has a history of COPD and prior lung resection for cancer. He developed severe pneumonia and was hospitalized last month. Chest x-ray and CT showed multiloculated abscess in the left upper lobe. Lung biopsy done on August 20 had shown abundant fungal hyphae. Sputum cultures were negative but his aspergillus antibody was positive. He was treated with voriconazole in addition to IV vancomycin and piperacillin tazobactam. He completed 30 days of IV antibiotics at [facility] home on October 5 and then was discharged on Augmentin. He has continued to take voriconazole. After going home several days ago he began to develop some  epigastric pain and felt like his throat was closing up. He also noted that his eyes were blurry. He took some Alka-Seltzer last night and feels much better today. Overall he is feeling much better. He has not had any fever, chills or sweats. He is still somewhat short of breath but his cough is much improved and he no longer is bringing up any sputum. He states that his appetite has improved. Review of Systems: Pertinent items are noted in HPI.  Past Medical History  Diagnosis Date  . Peripheral vascular disease     with claudication  . Pancreatitis     chronic  . Hypertension   . Glaucoma   . COPD (chronic obstructive pulmonary disease)   . Ulcer     peptic ulcer disease  . H/O alcohol abuse   . History of tobacco abuse   . PONV (postoperative nausea and vomiting)   . Anemia   . GERD (gastroesophageal reflux disease)     "NOT TO BAD NOW"  . Carpal tunnel syndrome, bilateral   . Colon polyps 12/2011  . Prostate carcinoma   . Lung cancer, upper lobe 2007    History  Substance Use Topics  . Smoking status: Former Smoker -- 0.10 packs/day for 59 years    Quit date: 09/28/2013  . Smokeless tobacco: Never Used     Comment:    . Alcohol Use: No     Comment: in the past    Family History  Problem Relation Age of Onset  . Alcohol abuse Father   . Cancer Brother     Allergies  Allergen Reactions  . Morphine And Related Itching    Head to toe  . Chantix [Varenicline] Other (See Comments)    Dreams, itching, suicidal ideation  . Hydromorphone Hcl Itching    Tolerates Hydrocodone-Acetaminophen  . Ibuprofen Nausea And Vomiting  . Omnipaque [Iohexol]      Code: HIVES, Desc: FACIAL HIVES S/P IV CONTRAST.Marland KitchenMarland KitchenOK W/ 25MG  BENADRYL//A.C., Onset Date: 43154008   . Shellfish Allergy Swelling    Objective: Temp: 98.2 F (36.8 C) (10/08 1558) Temp Source: Oral (10/08 1056) BP: 116/74 mmHg (10/08 1558) Pulse Rate: 98 (10/08 1558)  General: His weight is down but he is in much  better spirits. He is well dressed and pleasant. He is quite apologetic for being so growth when he was in the hospital. 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 He may be feeling a little worse since discharge from the nursing home because of starting on Augmentin. I will have him stop that now and continue voriconazole indefinitely. He is very reluctant to seeing lots of physicians. He had blood work obtained today when he saw Dr. Birdie Riddle. I feel comfortable holding  off on hematology evaluation as long as his hemoglobin is stable and he is improving clinically.  Plan: 1. Continue voriconazole 2. Discontinue Augmentin 3. Check chest x-ray 4. Followup lab work done earlier today 5. Followup here in 4 weeks   Michel Bickers, MD [facility] for [place] [phone] pager   [phone] cell 11/04/2013, 4:30 PM

## 2013-11-04 NOTE — Assessment & Plan Note (Signed)
New to provider.  Pt received 2 units PRBCs during hospitalization and Aranesp on 9/11.  It was recommended that pt follow up w/ hematology (no appt scheduled) but pt very resistant to this idea- 'i hate seeing new doctors'.  Check CBC today.  Will try and get pt to see Heme as recommended once we have lab results to review.

## 2013-11-04 NOTE — Progress Notes (Signed)
   Subjective:    Patient ID: [name] [name], male    DOB: November 16, 1945, 68 y.o.   MRN: [id number]  [facility] f/u- pt went to the ER on 9/6 after 3 months of fever and multiple rounds of abx and had CT scan that showed LUL abscess vs necrotizing PNA.  Admitted to hospital 9/6-9/15.  Was found to be AFB (-).  Was aspergillosis antibody +.  Following w/ ID.  Completed course of IV abx at SNF.  Now on Augmentin and Voriconazole.  Has appt w/ Dr Megan Salon later today.  D/C'd from SNF on 10/1.  No fevers since returning home.  + SOB above baseline (hx of COPD).  Pt feels the SOB is due to deconditioning.  Pt was noted to be anemic while hospitalized.  Had blood transfusion- 2 pints per pt report.  Had Arenesp on 9/11.  D/c summary recommended f/u w/ Dr Lona Kettle (hematology)- 'i don't even know who that is'.  Pt denies CP, edema.  Last smoked cigarette yesterday.  His plan is to quit.   Review of Systems For ROS see HPI     Objective:   Physical Exam  Vitals reviewed. Constitutional: He is oriented to person, place, and time. No distress.  Cachetic  HENT:  Head: Normocephalic and atraumatic.  Neck: Normal range of motion. Neck supple.  Cardiovascular: Normal rate, regular rhythm, normal heart sounds and intact distal pulses.   Pulmonary/Chest: Effort normal and breath sounds normal. No respiratory distress. He has no wheezes. He has no rales.  Musculoskeletal: He exhibits no edema.  Lymphadenopathy:    He has no cervical adenopathy.  Neurological: He is alert and oriented to person, place, and time. No cranial nerve deficit. Coordination normal.  Skin: Skin is warm and dry.  Psychiatric: He has a normal mood and affect. His behavior is normal.          Assessment & Plan:

## 2013-11-04 NOTE — Assessment & Plan Note (Signed)
Pt has nephrology appt w/ [facility] pending.  Will get BMP today.

## 2013-11-15 ENCOUNTER — Encounter: Payer: Self-pay | Admitting: Internal Medicine

## 2013-11-15 ENCOUNTER — Ambulatory Visit (INDEPENDENT_AMBULATORY_CARE_PROVIDER_SITE_OTHER): Payer: PRIVATE HEALTH INSURANCE | Admitting: Internal Medicine

## 2013-11-15 VITALS — BP 122/80 | HR 106 | Temp 98.3°F | Wt 116.5 lb

## 2013-11-15 DIAGNOSIS — J679 Hypersensitivity pneumonitis due to unspecified organic dust: Secondary | ICD-10-CM

## 2013-11-15 DIAGNOSIS — B441 Other pulmonary aspergillosis: Secondary | ICD-10-CM

## 2013-11-15 DIAGNOSIS — J850 Gangrene and necrosis of lung: Secondary | ICD-10-CM

## 2013-11-15 MED ORDER — VORICONAZOLE 200 MG PO TABS: 200.0000 mg | ORAL_TABLET | Freq: Two times a day (BID) | ORAL | Status: AC

## 2013-11-15 NOTE — Progress Notes (Signed)
Patient ID: [name] [name], male   DOB: 1945/10/25, 68 y.o.   MRN: [id number]         [facility] for Infectious Disease  Patient Active Problem List   Diagnosis Date Noted  . 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 Necrotizing pneumonia 10/03/2013    Priority: High  . Chronic back pain 11/04/2013  . Secondary renovascular hypertension, benign 10/25/2013  . Anemia in chronic renal disease 10/21/2013  . Chronic airway obstruction, not elsewhere classified 10/14/2013  . History of lung abscess 10/05/2013  . Protein-calorie malnutrition, severe 10/04/2013  . CKD (chronic kidney disease) stage 3, GFR 30-59 ml/min 10/04/2013  . Chronic cholecystitis with calculus 07/20/2013  . Poor compliance with fair medical history recall 07/20/2013  . Hiatal hernia 07/20/2013  . Prostate cancer 04/28/2013  . Smoker 10/29/2012  . History of lung cancer 10/29/2012  . Pre-operative cardiovascular examination 08/21/2012  . PVD (peripheral vascular disease) 08/21/2012  . Pain in limb 06/05/2012  . Atherosclerosis of native arteries of the extremities with intermittent claudication 03/20/2012  . Colon polyps 12/29/2011  . 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 Aorto-iliac disease 09/18/2011  . Hypertension 12/05/2010  . Peripheral vascular disease 12/05/2010  . Glaucoma   . Asthma, intrinsic   . H/O alcohol abuse     Patient's Medications  New Prescriptions   No medications on file  Previous Medications   ALLOPURINOL (ZYLOPRIM) 100 MG TABLET    Take 100 mg by mouth daily as needed (gout pain).    AMITRIPTYLINE (ELAVIL) 50 MG TABLET    Take 1 tablet (50 mg total) by mouth daily.   AMLODIPINE (NORVASC) 10 MG TABLET    Take 1 tablet (10 mg total) by mouth daily.   ASPIRIN 81 MG CHEWABLE TABLET    Chew 162 mg by mouth daily as needed for fever.   FEEDING SUPPLEMENT, ENSURE COMPLETE, (ENSURE COMPLETE) LIQD    Take 237 mLs by mouth 3 (three) times  daily between meals.   FERROUS SULFATE 325 (65 FE) MG TABLET    Take 1 tablet (325 mg total) by mouth 2 (two) times daily with a meal.   FLUTICASONE (FLONASE) 50 MCG/ACT NASAL SPRAY    Place 2 sprays into both nostrils daily.   HYDROCHLOROTHIAZIDE (MICROZIDE) 12.5 MG CAPSULE    Take 1 capsule (12.5 mg total) by mouth daily.   HYDROCODONE-ACETAMINOPHEN (NORCO) 7.5-325 MG PER TABLET    Take 1-2 tablets by mouth every 6 (six) hours as needed for moderate pain. Take 1-2 tablets every 4 hours as needed.   LATANOPROST (XALATAN) 0.005 % OPHTHALMIC SOLUTION    Place 1 drop into both eyes at bedtime.    PROAIR HFA 108 (90 BASE) MCG/ACT INHALER    Inhale 2 puffs into the lungs every 4 (four) hours as needed for wheezing or shortness of breath.    TAMSULOSIN (FLOMAX) 0.4 MG CAPS CAPSULE    Take 1 capsule (0.4 mg total) by mouth daily after supper.   VALACYCLOVIR (VALTREX) 1000 MG TABLET    Take 1 tablet (1,000 mg total) by mouth daily.  Modified Medications   Modified Medication Previous Medication   VORICONAZOLE (VFEND) 200 MG TABLET voriconazole (VFEND) 200 MG tablet      Take 1 tablet (200 mg total) by mouth every 12 (twelve) hours.    Take 1 tablet (200 mg total) by mouth every 12 (twelve) hours.  Discontinued Medications   No medications on file  Subjective: [name] [name] is in for his routine visit. 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 He is no longer producing any sputum. He still has some cough but it has improved. His dyspnea on exertion remains unchanged. His appetite has improved but he has not started gaining back the weight he lost yet. Review of Systems: Pertinent items are noted in HPI.  Past Medical History  Diagnosis Date  . Peripheral vascular disease     with claudication  . Pancreatitis     chronic  . Hypertension   . Glaucoma   . COPD (chronic obstructive pulmonary disease)   . Ulcer     peptic  ulcer disease  . H/O alcohol abuse   . History of tobacco abuse   . PONV (postoperative nausea and vomiting)   . Anemia   . GERD (gastroesophageal reflux disease)     "NOT TO BAD NOW"  . Carpal tunnel syndrome, bilateral   . Colon polyps 12/2011  . Prostate carcinoma   . Lung cancer, upper lobe 2007    History  Substance Use Topics  . Smoking status: Former Smoker -- 0.10 packs/day for 51 years    Quit date: 09/28/2013  . Smokeless tobacco: Never Used     Comment:    . Alcohol Use: No     Comment: in the past    Family History  Problem Relation Age of Onset  . Alcohol abuse Father   . Cancer Brother     Allergies  Allergen Reactions  . Morphine And Related Itching    Head to toe  . Chantix [Varenicline] Other (See Comments)    Dreams, itching, suicidal ideation  . Hydromorphone Hcl Itching    Tolerates Hydrocodone-Acetaminophen  . Ibuprofen Nausea And Vomiting  . Omnipaque [Iohexol]      Code: HIVES, Desc: FACIAL HIVES S/P IV CONTRAST.Marland KitchenMarland KitchenOK W/ 25MG  BENADRYL//A.C., Onset Date: 72094709   . Shellfish Allergy Swelling    Objective: Temp: 98.3 F (36.8 C) (10/19 1358) Temp Source: Oral (10/19 1358) BP: 122/80 mmHg (10/19 1358) Pulse Rate: 106 (10/19 1358)  General: His weight remains 116 pounds. He is thin and in no distress Skin: No rash Lungs: Clear Cor: Regular S1-S2 with no murmurs  CMP     Component Value Date/Time   NA 137 11/04/2013 1136   K 4.1 11/04/2013 1136   CL 100 11/04/2013 1136   CO2 24 11/04/2013 1136   GLUCOSE 88 11/04/2013 1136   BUN 42* 11/04/2013 1136   CREATININE 1.9* 11/04/2013 1136   CREATININE 1.83* 10/19/2013 1635   CALCIUM 10.0 11/04/2013 1136   PROT 9.5* 11/04/2013 1136   ALBUMIN 2.8* 11/04/2013 1136   AST 14 11/04/2013 1136   ALT 9 11/04/2013 1136   ALKPHOS 103 11/04/2013 1136   BILITOT 0.4 11/04/2013 1136   GFRNONAA 38* 10/19/2013 1635   GFRNONAA 48* 10/11/2013 0530   GFRAA 44* 10/19/2013 1635   GFRAA 56* 10/11/2013 0530   Lab  Results  Component Value Date   WBC 17.1* 11/04/2013   HGB 10.8* 11/04/2013   HCT 34.0* 11/04/2013   MCV 84.4 11/04/2013   PLT 414.0* 11/04/2013   CHEST 2 VIEW  COMPARISON: 10/03/2013  FINDINGS:    IMPRESSION:  1. Large cavitary lesion at the left apex persists, with fluid  level. No pleural effusion or pneumothorax.  2. Pneumonia or hemorrhage surrounding the cavitary lesion has  decreased from September 2015.  By: Jorje Guild M.D.  On: 11/04/2013 17:09   Assessment: He is improving slowly on therapy for chronic, necrotizing Aspergillus pneumonia. I will continue voriconazole indefinitely. His right upper lobe cavity remains and there is still an air-fluid level. If this does not improve significantly in the next few months he will need a followup CT scan.  Plan: 1. Continue voriconazole 2. Followup in 6 weeks   Michel Bickers, MD [facility] for [place] [phone] pager   [phone] cell 11/15/2013, 2:19 PM

## 2013-11-16 LAB — AFB CULTURE WITH SMEAR (NOT AT ARMC)
Acid Fast Smear: NONE SEEN
SPECIAL REQUESTS: NORMAL

## 2013-11-17 LAB — AFB CULTURE WITH SMEAR (NOT AT ARMC)
Acid Fast Smear: NONE SEEN
SPECIAL REQUESTS: NORMAL

## 2013-11-18 LAB — AFB CULTURE WITH SMEAR (NOT AT ARMC)
Acid Fast Smear: NONE SEEN
SPECIAL REQUESTS: NORMAL

## 2013-11-23 ENCOUNTER — Ambulatory Visit (INDEPENDENT_AMBULATORY_CARE_PROVIDER_SITE_OTHER): Payer: PRIVATE HEALTH INSURANCE | Admitting: Medical

## 2013-11-23 ENCOUNTER — Encounter: Payer: Self-pay | Admitting: Medical

## 2013-11-23 VITALS — BP 145/87 | HR 84 | Temp 98.7°F | Ht 66.75 in | Wt 115.8 lb

## 2013-11-23 DIAGNOSIS — N183 Chronic kidney disease, stage 3 unspecified: Secondary | ICD-10-CM

## 2013-11-23 DIAGNOSIS — D631 Anemia in chronic kidney disease: Secondary | ICD-10-CM

## 2013-11-23 DIAGNOSIS — Z79899 Other long term (current) drug therapy: Secondary | ICD-10-CM

## 2013-11-23 DIAGNOSIS — N289 Disorder of kidney and ureter, unspecified: Secondary | ICD-10-CM

## 2013-11-23 DIAGNOSIS — N189 Chronic kidney disease, unspecified: Secondary | ICD-10-CM

## 2013-11-23 DIAGNOSIS — B449 Aspergillosis, unspecified: Secondary | ICD-10-CM

## 2013-11-23 DIAGNOSIS — D649 Anemia, unspecified: Secondary | ICD-10-CM

## 2013-11-23 LAB — CBC WITH DIFFERENTIAL/PLATELET
BASOS ABS: 0.1 10*3/uL (ref 0.0–0.1)
Basophils Relative: 0.7 % (ref 0.0–3.0)
Eosinophils Absolute: 1.6 10*3/uL — ABNORMAL HIGH (ref 0.0–0.7)
Eosinophils Relative: 12.4 % — ABNORMAL HIGH (ref 0.0–5.0)
HCT: 32.8 % — ABNORMAL LOW (ref 39.0–52.0)
HEMOGLOBIN: 10.7 g/dL — AB (ref 13.0–17.0)
LYMPHS PCT: 16.9 % (ref 12.0–46.0)
Lymphs Abs: 2.2 10*3/uL (ref 0.7–4.0)
MCHC: 32.5 g/dL (ref 30.0–36.0)
MCV: 84.7 fl (ref 78.0–100.0)
Monocytes Absolute: 1.1 10*3/uL — ABNORMAL HIGH (ref 0.1–1.0)
Monocytes Relative: 8.5 % (ref 3.0–12.0)
Neutro Abs: 8.2 10*3/uL — ABNORMAL HIGH (ref 1.4–7.7)
Neutrophils Relative %: 61.5 % (ref 43.0–77.0)
PLATELETS: 324 10*3/uL (ref 150.0–400.0)
RBC: 3.88 Mil/uL — ABNORMAL LOW (ref 4.22–5.81)
RDW: 19.6 % — AB (ref 11.5–15.5)
WBC: 13.3 10*3/uL — ABNORMAL HIGH (ref 4.0–10.5)

## 2013-11-23 LAB — COMPREHENSIVE METABOLIC PANEL
ALK PHOS: 150 U/L — AB (ref 39–117)
ALT: 9 U/L (ref 0–53)
AST: 15 U/L (ref 0–37)
Albumin: 2.6 g/dL — ABNORMAL LOW (ref 3.5–5.2)
BILIRUBIN TOTAL: 0.2 mg/dL (ref 0.2–1.2)
BUN: 22 mg/dL (ref 6–23)
CO2: 19 meq/L (ref 19–32)
Calcium: 9.7 mg/dL (ref 8.4–10.5)
Chloride: 102 mEq/L (ref 96–112)
Creatinine, Ser: 1.5 mg/dL (ref 0.4–1.5)
GFR: 62.61 mL/min (ref >60.00)
GLUCOSE: 81 mg/dL (ref 70–99)
Potassium: 4.5 mEq/L (ref 3.5–5.1)
Sodium: 135 mEq/L (ref 135–145)
Total Protein: 8.7 g/dL — ABNORMAL HIGH (ref 6.0–8.3)

## 2013-11-23 LAB — IRON AND TIBC
%SAT: 14 % — ABNORMAL LOW (ref 20–55)
IRON: 33 ug/dL — AB (ref 42–165)
TIBC: 229 ug/dL (ref 215–435)
UIBC: 196 ug/dL (ref 125–400)

## 2013-11-23 LAB — VITAMIN B12: Vitamin B-12: 449 pg/mL (ref 211–911)

## 2013-11-23 LAB — FERRITIN: FERRITIN: 898 ng/mL — AB (ref 22.0–322.0)
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
## 2013-11-23 NOTE — Progress Notes (Signed)
Pre visit review using our clinic review tool, if applicable. No additional management support is needed unless otherwise documented below in the visit note. 

## 2013-11-23 NOTE — Assessment & Plan Note (Addendum)
Pt recently followed up with specialist. He is still taking (voriconazole). His lungs fields sound clear today and his o2 sat is 97%.  Pt mentioned that infectious disease wants him to see a pulmonologist but he is not happy with this. I will make a referral but ask our staff to check and make sure infectious disease has not already mad.

## 2013-11-23 NOTE — Assessment & Plan Note (Addendum)
Will check his cmp today. And I did encourage/advise pt to see nephrologist. He expressed that he would not go and upset at idea of going.

## 2013-11-23 NOTE — Patient Instructions (Signed)
We will get cmp and cbc today. Assess your kidney function and your anemia level.   I would recommend you do attend nephrologist appointment and will refer you to pulmonologist as well. Continue to follow recommendation/treatment of infectious disease doctor for you lung infection  I recommend you make appointment in 4-6 wks(sooner if appointment is available) to see Dr. Etter Sjogren as your expressed desire to see her. During the interim if you have any worsening signs or symptoms then other providers can see you.

## 2013-11-23 NOTE — Assessment & Plan Note (Signed)
Will repeat cbc today. Also am getting some anemia panel studies and hemmocult cards x 3 for blood.

## 2013-11-26 DIAGNOSIS — G8929 Other chronic pain: Secondary | ICD-10-CM

## 2013-11-26 DIAGNOSIS — M549 Dorsalgia, unspecified: Secondary | ICD-10-CM

## 2013-11-30 ENCOUNTER — Other Ambulatory Visit (INDEPENDENT_AMBULATORY_CARE_PROVIDER_SITE_OTHER): Payer: Medicare Other

## 2013-11-30 DIAGNOSIS — D649 Anemia, unspecified: Secondary | ICD-10-CM

## 2013-11-30 LAB — POC HEMOCCULT BLD/STL (HOME/3-CARD/SCREEN)
Card #3 Fecal Occult Blood, POC: NEGATIVE
FECAL OCCULT BLD: NEGATIVE
FECAL OCCULT BLD: NEGATIVE

## 2013-12-01 ENCOUNTER — Encounter (HOSPITAL_COMMUNITY): Payer: Self-pay | Admitting: *Deleted

## 2013-12-01 ENCOUNTER — Emergency Department (HOSPITAL_COMMUNITY): Payer: Medicare Other

## 2013-12-01 ENCOUNTER — Emergency Department (HOSPITAL_COMMUNITY)
Admission: EM | Admit: 2013-12-01 | Discharge: 2013-12-01 | Disposition: A | Discharge status: Home / Self Care | Payer: Medicare Other | Attending: Emergency Medicine | Admitting: Emergency Medicine

## 2013-12-01 ENCOUNTER — Telehealth: Payer: Self-pay | Admitting: Family Medicine

## 2013-12-01 ENCOUNTER — Telehealth: Payer: Self-pay | Admitting: *Deleted

## 2013-12-01 ENCOUNTER — Institutional Professional Consult (permissible substitution): Payer: Medicare Other | Admitting: Pulmonary Disease

## 2013-12-01 DIAGNOSIS — Z85118 Personal history of other malignant neoplasm of bronchus and lung: Secondary | ICD-10-CM | POA: Insufficient documentation

## 2013-12-01 DIAGNOSIS — Z8701 Personal history of pneumonia (recurrent): Secondary | ICD-10-CM | POA: Insufficient documentation

## 2013-12-01 DIAGNOSIS — D649 Anemia, unspecified: Secondary | ICD-10-CM | POA: Diagnosis not present

## 2013-12-01 DIAGNOSIS — J441 Chronic obstructive pulmonary disease with (acute) exacerbation: Secondary | ICD-10-CM | POA: Insufficient documentation

## 2013-12-01 DIAGNOSIS — Z8669 Personal history of other diseases of the nervous system and sense organs: Secondary | ICD-10-CM | POA: Diagnosis not present

## 2013-12-01 DIAGNOSIS — H409 Unspecified glaucoma: Secondary | ICD-10-CM | POA: Insufficient documentation

## 2013-12-01 DIAGNOSIS — Z8546 Personal history of malignant neoplasm of prostate: Secondary | ICD-10-CM | POA: Diagnosis not present

## 2013-12-01 DIAGNOSIS — R079 Chest pain, unspecified: Secondary | ICD-10-CM

## 2013-12-01 DIAGNOSIS — Z8719 Personal history of other diseases of the digestive system: Secondary | ICD-10-CM | POA: Diagnosis not present

## 2013-12-01 DIAGNOSIS — Z87891 Personal history of nicotine dependence: Secondary | ICD-10-CM | POA: Insufficient documentation

## 2013-12-01 DIAGNOSIS — Z86010 Personal history of colonic polyps: Secondary | ICD-10-CM | POA: Diagnosis not present

## 2013-12-01 DIAGNOSIS — Z79899 Other long term (current) drug therapy: Secondary | ICD-10-CM | POA: Insufficient documentation

## 2013-12-01 DIAGNOSIS — Z872 Personal history of diseases of the skin and subcutaneous tissue: Secondary | ICD-10-CM | POA: Insufficient documentation

## 2013-12-01 DIAGNOSIS — Z7951 Long term (current) use of inhaled steroids: Secondary | ICD-10-CM | POA: Insufficient documentation

## 2013-12-01 DIAGNOSIS — I10 Essential (primary) hypertension: Secondary | ICD-10-CM | POA: Insufficient documentation

## 2013-12-01 DIAGNOSIS — I739 Peripheral vascular disease, unspecified: Secondary | ICD-10-CM | POA: Diagnosis not present

## 2013-12-01 DIAGNOSIS — Z7982 Long term (current) use of aspirin: Secondary | ICD-10-CM | POA: Insufficient documentation

## 2013-12-01 DIAGNOSIS — Z9889 Other specified postprocedural states: Secondary | ICD-10-CM | POA: Insufficient documentation

## 2013-12-01 HISTORY — DX: Aspergillosis, unspecified: B44.9

## 2013-12-01 HISTORY — DX: Gangrene and necrosis of lung: J85.0

## 2013-12-01 LAB — BASIC METABOLIC PANEL
ANION GAP: 13 (ref 5–15)
BUN: 21 mg/dL (ref 6–23)
CHLORIDE: 95 meq/L — AB (ref 96–112)
CO2: 24 meq/L (ref 19–32)
Calcium: 10.1 mg/dL (ref 8.4–10.5)
Creatinine, Ser: 1.44 mg/dL — ABNORMAL HIGH (ref 0.50–1.35)
GFR calc non Af Amer: 49 mL/min — ABNORMAL LOW (ref >90)
GFR, EST AFRICAN AMERICAN: 57 mL/min — AB (ref >90)
Glucose, Bld: 131 mg/dL — ABNORMAL HIGH (ref 70–99)
POTASSIUM: 4.4 meq/L (ref 3.7–5.3)
SODIUM: 132 meq/L — AB (ref 137–147)

## 2013-12-01 LAB — I-STAT TROPONIN, ED: TROPONIN I, POC: 0 ng/mL (ref 0.00–0.08)

## 2013-12-01 LAB — CBC
HCT: 32.4 % — ABNORMAL LOW (ref 39.0–52.0)
Hemoglobin: 10.8 g/dL — ABNORMAL LOW (ref 13.0–17.0)
MCH: 27.7 pg (ref 26.0–34.0)
MCHC: 33.3 g/dL (ref 30.0–36.0)
MCV: 83.1 fL (ref 78.0–100.0)
PLATELETS: 335 10*3/uL (ref 150–400)
RBC: 3.9 MIL/uL — AB (ref 4.22–5.81)
RDW: 17.5 % — AB (ref 11.5–15.5)
WBC: 15.4 10*3/uL — ABNORMAL HIGH (ref 4.0–10.5)

## 2013-12-01 LAB — D-DIMER, QUANTITATIVE (NOT AT ARMC): D DIMER QUANT: 1.35 ug{FEU}/mL — AB (ref 0.00–0.48)

## 2013-12-01 MED ORDER — TECHNETIUM TC 99M DIETHYLENETRIAME-PENTAACETIC ACID: 46.7000 | Freq: Once | INTRAVENOUS | Status: DC | PRN

## 2013-12-01 MED ORDER — OXYCODONE-ACETAMINOPHEN 5-325 MG PO TABS
2.0000 | ORAL_TABLET | Freq: Once | ORAL | Status: AC
  Administered 2013-12-01: 2 via ORAL
  Filled 2013-12-01: qty 2

## 2013-12-01 MED ORDER — TECHNETIUM TO 99M ALBUMIN AGGREGATED
5.0000 | Freq: Once | INTRAVENOUS | Status: AC | PRN
  Administered 2013-12-01: 5 via INTRAVENOUS

## 2013-12-01 NOTE — Telephone Encounter (Signed)
Call from nurse at patient's PCP office, Dr. Etter Sjogren; patient called there c/o chest tightness, sob, left shoulder pain, and jaw pain. She advised patient to go to the ED and he said he did not want to do this, he was sure the symptoms are from the voriconazole prescribed by Dr. Megan Salon. Patient also spoke to a pharmacist and was told this did not sound like side effects from the antibiotic. I called the patient and advised him that this could be serious and he needed to have someone take him to the ED. I waited a few minutes and called him back he said his goddaughter is on the way to his home to bring him to the ED.

## 2013-12-01 NOTE — Telephone Encounter (Addendum)
Spoke with patient and he stated when he takes Voriconazole he starts to have SOB, chest pain, left shoulder pain, jaw pain and lack of energy. The patient stated that he has had the symptoms since 10/28/13 when he was released from the hospital. Because of the pain he then takes a Hydrocodone and it makes him itch all over. The patient only has the symptoms when he takes the medication and wants to know what he should do since he is taking it twice a day. He is currently having some chest Pain at this time and having SOB. He said the pharmacist made him aware that the medications should not cause a problems if taken together. Call sent to [facility] per triage protocol.      KP

## 2013-12-01 NOTE — ED Notes (Signed)
Hx lung cancer. Pt was treated in hospital in September for aspergillus pneumonia and necrotizing pneumonia. Pt has been on antifungal/abx x1 month. Reports started having chest pain x2 weeks. Pain at present 7/10. Pt reports he is always SOB due to hx of lung cancer.

## 2013-12-01 NOTE — Telephone Encounter (Signed)
Spoke with patient and he gave the same report as below.  He states that he has been experiencing chest tightness and shortness of breath since he started Voriconazole.   He is still experiencing these symptoms at the time of call, but states that "it's not that bad now."    He really feels that it may be coming from medication and does not want to go to ER at this time despite it being advised to so.  He also stated that he was unable to come in for an appointment due to transportation issues.    The Voriconazole was prescribed by Dr. Michel Bickers.  Pt may be experiencing a reaction to Voriconazole.  Pt was advise that the message would be sent to Dr. Megan Salon and if his symptoms worsen he is to call 911 EMS and go to hospital.  Pt stated understanding and agreed.    Instead of messaging, Dr. Hale Bogus office was called to make them aware of the situation.  Spoke with Kennyth Lose, and her recommendation was to send patient to ER and she stated that she would call the patient and make Dr. Megan Salon aware.

## 2013-12-01 NOTE — Telephone Encounter (Signed)
I agree with RN assessment and advise.

## 2013-12-01 NOTE — ED Notes (Addendum)
Patient transported to [facility] 

## 2013-12-01 NOTE — Discharge Instructions (Signed)
Return to the ED with any concerns including difficulty breathing, worsening pain, vomiting and not able to keep down liquids, decreased level of alertness/lethargy, or any other alarming symptoms

## 2013-12-01 NOTE — Telephone Encounter (Signed)
Caller name: [name] [name] Relation to pt: self Call back number: [phone] Pharmacy:  Reason for call:   Patient states that the new medicine that he is on(does not know name) is making him feel bad(will not tell me what his symptoms are). Sending to you because Percell Miller has been seeing him recently.
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
## 2013-12-01 NOTE — Telephone Encounter (Signed)
Called patient to see which medication is causing problem,states he does not know which medication is causing itching and fatigue. Advised that I would confer with E. Saguier,patient states he does not want ES to handle he wants to talk to Dr. Etter Sjogren. Wants Dr. Etter Sjogren to call him. Advised I  Would give message to Dr. Ivy Lynn' nurse.

## 2013-12-02 NOTE — Telephone Encounter (Signed)
Pt was seen in the ED on 12/01/13.

## 2013-12-07 ENCOUNTER — Other Ambulatory Visit (INDEPENDENT_AMBULATORY_CARE_PROVIDER_SITE_OTHER): Payer: Medicare Other

## 2013-12-07 ENCOUNTER — Encounter: Payer: Self-pay | Admitting: Internal Medicine

## 2013-12-07 ENCOUNTER — Ambulatory Visit (INDEPENDENT_AMBULATORY_CARE_PROVIDER_SITE_OTHER): Payer: Medicare Other | Admitting: Internal Medicine

## 2013-12-07 VITALS — BP 118/60 | HR 78 | Ht 66.5 in | Wt 119.4 lb

## 2013-12-07 DIAGNOSIS — R918 Other nonspecific abnormal finding of lung field: Secondary | ICD-10-CM

## 2013-12-07 DIAGNOSIS — B449 Aspergillosis, unspecified: Secondary | ICD-10-CM

## 2013-12-07 DIAGNOSIS — J45909 Unspecified asthma, uncomplicated: Secondary | ICD-10-CM

## 2013-12-07 LAB — SEDIMENTATION RATE: Sed Rate: 110 mm/hr — ABNORMAL HIGH (ref 0–22)

## 2013-12-07 NOTE — Patient Instructions (Addendum)
Keep your appointment with Dr Megan Salon as planned  Please remember to go to the lab   department downstairs for your tests - we will call you with the results when they are available.    Dr Megan Salon may want you to see another pulmonary specialist and I would be happy to expedite this

## 2013-12-07 NOTE — Progress Notes (Signed)
Subjective:    Patient ID: [name] [name], male    DOB: [birth year]   MRN: [id number]    Brief patient profile:  21 yobm who says quit smoking 09/2013  sp vats RULobectomy for [facility] 2008  and no adjuvant therapy with onset of doe about the same time of his surgery with increased need for albuterol starting after surgery referred to pulmonary  10/28/2012  by Dr Hilarie Fredrickson (whom  he saw for chronic pancreatitis)  for copd eval   History of Present Illness  10/28/2012 1st  Pulmonary office visit/ [name] [name] still smoking cc doe x 5 years slow progressive variably across the room and on a good day walk flat like at the mall but no hills/ hurry even on best days, assoc with some nasal congestion but no purulent secretions rec Only use your albuterol (proaire)as a rescue medication   Please schedule a follow up office visit in 6 weeks, call sooner if needed with pfts    12/09/2012 f/u ov/[name] [name] re: copd vs asthma  Chief Complaint  Patient presents with  . Followup with PFT    Pt states that his SOB is unchanged- still using proair qid whether he needs it or not.    sometimes sob "just standing up", sometimes not unless goes up hills Not typically having any  noct spells Comes into office s am saba (none x 12 h) with nl pfts but feeling his usual symptoms s it and says would have already used it if we had not asked him to hold off. rec Only use your albuterol (proaire)  As rescue The key is to stop smoking completely before smoking completely stops you!  Pulmonary follow up is as needed   07/29/2013 f/u ov/[name] [name] re: p rx for Biaxin 07/07/13 for ? LUL pna Chief Complaint  Patient presents with  . Follow-up    Pt last seen 12/09/12. He states that he was dxed with pneumonitis approx 1 month ago and is having increased SOB since then. Using rescue inhaler 2-3 times per day.  He states that he is SOB with or w/o any exertion. He has prod cough with moderate amounts of sputum-? color. Pt concerned about  unintended wt loss that started about 6 wks ago.   No saba since 12 h prior to OV   Did not bring saba with him rec  Refer to HD as prob TB > all sputum studies neg/ neg IPPD > referred back to pulmonary     09/13/2013 f/u ov/[name] [name] re: cavitary dz LUL/ still smoking  Chief Complaint  Patient presents with  . Follow-up    Cough unchanged and is prod with mostly clear to white mucus, occasionally green to black.  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 Follow-up    Pt states that his breathing is unchanged since the last visit. He  c/o CP and occ cough- clear sputum.   24h per day L chest hurts mostly anteriory and "all over" and only changes p pain med some better, but nothing makes it "some worse" because "Everything makes it worse"- could not establish any def relationship to breathing or coughing in terms of intensity but he was extremely evasive on this point.  Rarely using saba, doesn't feel it helps any of his chronic problems  No obvious pattern in day to day or daytime variabilty or assoc   chest tightness, subjective wheeze overt sinus or hb symptoms. No unusual exp hx or h/o childhood pna/ asthma or knowledge of premature birth.  Sleeping ok without nocturnal  or early am exacerbation  of respiratory  c/o's or need for noct saba. Also denies any obvious fluctuation of symptoms with weather or environmental changes or other aggravating  or alleviating factors except as outlined above   Current Medications, Allergies, Complete Past Medical History, Past Surgical History, Family History, and Social History were reviewed in [employer] record.  ROS  The following are not active complaints unless bolded sore throat, dysphagia, dental problems, itching, sneezing,  nasal congestion or excess/ purulent secretions, ear ache,   fever, chills, sweats, unintended wt loss, pleuritic or exertional cp, hemoptysis,  orthopnea pnd or leg swelling, presyncope, palpitations, heartburn, abdominal pain, anorexia, nausea, vomiting, diarrhea  or change in bowel or urinary habits, change in stools or urine, dysuria,hematuria,  rash, arthralgias, visual complaints, headache, numbness weakness or ataxia or problems with walking or coordination,  change in mood/affect or memory.               Objective:   Physical Exam  07/29/2013         123 > 09/13/2013  123 > 12/07/2013  119   Wt Readings from Last 3 Encounters:  12/09/12 126 lb (57.153 kg)  10/28/12 127 lb 6.4 oz (57.788 kg)  10/20/12 127 lb (57.607 kg)        amb bm nad  With unusual affect, very limited insight into medical care/ hopeless helpless   HEENT: nl dentition, turbinates, and orophanx. Nl external ear canals without cough reflex   NECK :  without JVD/Nodes/TM/ nl carotid upstrokes bilaterally   LUNGS: no acc muscle use, clear to A and P bilaterally without cough on insp or exp maneuvers   CV:  RRR  no s3 or murmur or increase in P2, no edema   ABD:  soft and nontender with nl excursion in the supine position. No bruits or organomegaly, bowel sounds nl  MS:  warm without deformities, calf tenderness, cyanosis or clubbing  SKIN: warm and dry without lesions          cxr 12/01/13 Stable cavitary mass left apex with surrounding pneumonitis and volume loss. Underlying emphysema with areas of scarring. Postoperative change on the  right.   .  Recent Labs Lab 12/01/13 1346  NA 132*  K 4.4  CL 95*  CO2 24  BUN 21  CREATININE 1.44*  GLUCOSE 131*    Recent Labs Lab 12/01/13 1346  HGB 10.8*  HCT 32.4*  WBC 15.4*  PLT 335     Lab Results  Component Value Date   ESRSEDRATE 110* 12/07/2013         Assessment & Plan:

## 2013-12-08 ENCOUNTER — Telehealth: Payer: Self-pay | Admitting: Family Medicine

## 2013-12-08 ENCOUNTER — Other Ambulatory Visit: Payer: Self-pay | Admitting: Adult Health

## 2013-12-08 ENCOUNTER — Other Ambulatory Visit: Payer: Self-pay | Admitting: Internal Medicine

## 2013-12-08 LAB — ALLERGY FULL PROFILE
ALLERGEN, D PTERNOYSSINUS, D1: 1.44 kU/L — AB
Allergen,Goose feathers, e70: 0.34 kU/L — ABNORMAL HIGH
Alternaria Alternata: 3.69 kU/L — ABNORMAL HIGH
Aspergillus fumigatus, m3: 8.12 kU/L — ABNORMAL HIGH
BAHIA GRASS: 0.16 kU/L — AB
BOX ELDER: 0.19 kU/L — AB
Bermuda Grass: 1 kU/L — ABNORMAL HIGH
Candida Albicans: 13.3 kU/L — ABNORMAL HIGH
Cat Dander: <0.1 kU/L
Common Ragweed: 0.12 kU/L — ABNORMAL HIGH
Curvularia lunata: 1.46 kU/L — ABNORMAL HIGH
D. farinae: 1.02 kU/L — ABNORMAL HIGH
Dog Dander: 0.3 kU/L — ABNORMAL HIGH
Elm IgE: <0.1 kU/L
FESCUE: 0.17 kU/L — AB
G005 RYE, PERENNIAL: 0.18 kU/L — AB
G009 Red Top: 0.16 kU/L — ABNORMAL HIGH
Goldenrod: 0.32 kU/L — ABNORMAL HIGH
Helminthosporium halodes: 6.48 kU/L — ABNORMAL HIGH
House Dust Hollister: 0.23 kU/L — ABNORMAL HIGH
IgE (Immunoglobulin E), Serum: 8770 kU/L — ABNORMAL HIGH (ref <115)
Lamb's Quarters: 0.35 kU/L — ABNORMAL HIGH
OAK CLASS: 0.15 kU/L — AB
Plantain: 0.16 kU/L — ABNORMAL HIGH
SYCAMORE TREE: 0.21 kU/L — AB
Stemphylium Botryosum: 3.88 kU/L — ABNORMAL HIGH
TIMOTHY GRASS: 0.13 kU/L — AB

## 2013-12-08 NOTE — Telephone Encounter (Signed)
Error/gd °

## 2013-12-08 NOTE — Progress Notes (Signed)
Quick Note:  Spoke with pt and notified of results per Dr. Wert. Pt verbalized understanding and denied any questions.  ______ 

## 2013-12-09 LAB — ASPERGILLUS ANTIBODY (COMPLEMENT FIX)

## 2013-12-09 NOTE — Assessment & Plan Note (Addendum)
-  first noted LUL changes  07/07/13 > rx biaxin > cavitary changes 07/29/2013 > referred to HD - 07/29/2013  [facility] RA  2 laps @ 185 ft each stopped due to  fatigue no desat  - ESR 112  07/29/2013  - 07/29/13 referred to HD > w/u for TB neg > sputum from 08/06/13 growing M gordonae - FOB 09/16/13 > copious mp secretions LUL>  Fungal elements/necrosisbut neg fungal cultures  > ID referral 09/17/2013 >  verconazole started 9/5/201  Note IgE down from over 23 k to 8770 on rx for aspergillus but he is not feeling better and cxr still shows larg af level posteriorly on L. Not sure to what extent his pain (which is most severe anteriorly on the L) is directly linked to pna vs neuralgia vs entirely psychosomatic based on his answers to questions re pain today which were at times in approriate and disrepectful of my intent to help sort them out so I welcome referral of pt to tertiary medical center but he declined so will refer him back to Dr Megan Salon - ? Consideration for LULobectomy at some point but this would be risky and doubt would correct his pain.

## 2013-12-09 NOTE — Assessment & Plan Note (Signed)
-   PFT's wnl x dlco 51% 12/09/2012   Adequate control on present rx, reviewed > no change in rx needed  (prn saba)

## 2013-12-28 ENCOUNTER — Encounter: Payer: Self-pay | Admitting: Internal Medicine

## 2013-12-28 ENCOUNTER — Ambulatory Visit (INDEPENDENT_AMBULATORY_CARE_PROVIDER_SITE_OTHER): Payer: Medicare Other | Admitting: Internal Medicine

## 2013-12-28 VITALS — BP 163/91 | HR 88 | Temp 97.9°F | Wt 114.8 lb

## 2013-12-28 DIAGNOSIS — B441 Other pulmonary aspergillosis: Secondary | ICD-10-CM

## 2013-12-28 DIAGNOSIS — J679 Hypersensitivity pneumonitis due to unspecified organic dust: Secondary | ICD-10-CM

## 2013-12-28 NOTE — Progress Notes (Signed)
Patient ID: [name] [name], male   DOB: 25-Mar-1945, 68 y.o.   MRN: [id number]         [facility]  Patient Active Problem List   Diagnosis Date Noted  . 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 Necrotizing pneumonia 10/03/2013    Priority: High  . Chronic back pain 11/04/2013  . Secondary renovascular hypertension, benign 10/25/2013  . Anemia in chronic renal disease 10/21/2013  . Chronic airway obstruction, not elsewhere classified 10/14/2013  . History of lung abscess 10/05/2013  . Protein-calorie malnutrition, severe 10/04/2013  . CKD (chronic kidney disease) stage 3, GFR 30-59 ml/min 10/04/2013  . Chronic cholecystitis with calculus 07/20/2013  . Poor compliance with fair medical history recall 07/20/2013  . Hiatal hernia 07/20/2013  . Prostate cancer 04/28/2013  . Smoker 10/29/2012  . History of lung cancer 10/29/2012  . Pre-operative cardiovascular examination 08/21/2012  . PVD (peripheral vascular disease) 08/21/2012  . Pain in limb 06/05/2012  . Atherosclerosis of native arteries of the extremities with intermittent claudication 03/20/2012  . Colon polyps 12/29/2011  . 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 Aorto-iliac disease 09/18/2011  . Hypertension 12/05/2010  . Peripheral vascular disease 12/05/2010  . Glaucoma   . Asthma, intrinsic   . H/O alcohol abuse     Patient's Medications  New Prescriptions   No medications on file  Previous Medications   ALLOPURINOL (ZYLOPRIM) 100 MG TABLET    Take 100 mg by mouth daily as needed (gout pain).    AMITRIPTYLINE (ELAVIL) 50 MG TABLET    Take 1 tablet (50 mg total) by mouth daily.   AMLODIPINE (NORVASC) 10 MG TABLET    Take 1 tablet (10 mg total) by mouth daily.   ASPIRIN 81 MG CHEWABLE TABLET    Chew 162 mg by mouth daily as needed for fever.   FEEDING SUPPLEMENT, ENSURE COMPLETE, (ENSURE COMPLETE) LIQD    Take 237 mLs by mouth 3 (three) times  daily between meals.   FERROUS SULFATE 325 (65 FE) MG TABLET    Take 1 tablet (325 mg total) by mouth 2 (two) times daily with a meal.   FLUTICASONE (FLONASE) 50 MCG/ACT NASAL SPRAY    Place 2 sprays into both nostrils daily.   HYDROCHLOROTHIAZIDE (MICROZIDE) 12.5 MG CAPSULE    Take 1 capsule (12.5 mg total) by mouth daily.   HYDROCODONE-ACETAMINOPHEN (NORCO) 7.5-325 MG PER TABLET    Take 1-2 tablets by mouth every 6 (six) hours as needed for moderate pain. Take 1-2 tablets every 4 hours as needed.   LATANOPROST (XALATAN) 0.005 % OPHTHALMIC SOLUTION    Place 1 drop into both eyes at bedtime.    PROAIR HFA 108 (90 BASE) MCG/ACT INHALER    Inhale 2 puffs into the lungs every 4 (four) hours as needed for wheezing or shortness of breath.    TAMSULOSIN (FLOMAX) 0.4 MG CAPS CAPSULE    Take 1 capsule (0.4 mg total) by mouth daily after supper.   VALACYCLOVIR (VALTREX) 1000 MG TABLET    Take 1 tablet (1,000 mg total) by mouth daily.   VORICONAZOLE (VFEND) 200 MG TABLET    Take 1 tablet (200 mg total) by mouth every 12 (twelve) hours.  Modified Medications   No medications on file  Discontinued Medications   No medications on file    Subjective: Jaidyn is in for his follow-up visit. He states that he is not doing well. He says he  has constant pain in his left upper chest and left jaw. He says it is there all of the time.  During my exam he mentions on several occasions that he thinks the pain is a side effect of medications. He says that he is not sure which medication. At one point he says that it was probably due to a medication he was given 25 years ago. He states that he knows the federal government was aware that these medicines were dangerous. He says he has been out of all of his medications other than his voriconazole. He states that he has been missing doses of the voriconazole frequently. He says he just wants to sleep all day. His appetite is poor. He lives alone here in town. He says that his  sister will stop about twice each week and help prepare food for him. He says that on occasion he does by hydrocodone from a neighbor. He denies any alcohol or drug use. Review of Systems: Pertinent items are noted in HPI.  Past Medical History  Diagnosis Date  . Peripheral vascular disease     with claudication  . Pancreatitis     chronic  . Hypertension   . Glaucoma   . COPD (chronic obstructive pulmonary disease)   . Ulcer     peptic ulcer disease  . H/O alcohol abuse   . History of tobacco abuse   . PONV (postoperative nausea and vomiting)   . Anemia   . GERD (gastroesophageal reflux disease)     "NOT TO BAD NOW"  . Carpal tunnel syndrome, bilateral   . Colon polyps 12/2011  . Prostate carcinoma   . Lung cancer, upper lobe 2007  . 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 Necrotizing pneumonia     09/2013    History  Substance Use Topics  . Smoking status: Former Smoker -- 0.10 packs/day for 50 years    Quit date: 09/28/2013  . Smokeless tobacco: Never Used     Comment:    . Alcohol Use: No     Comment: in the past    Family History  Problem Relation Age of Onset  . Alcohol abuse Father   . Cancer Brother     Allergies  Allergen Reactions  . Morphine And Related Itching    Head to toe  . Chantix [Varenicline] Other (See Comments)    Dreams, itching, suicidal ideation  . Hydromorphone Hcl Itching    Tolerates Hydrocodone-Acetaminophen  . Ibuprofen Nausea And Vomiting  . Omnipaque [Iohexol]      Code: HIVES, Desc: FACIAL HIVES S/P IV CONTRAST.Marland KitchenMarland KitchenOK W/ 25MG  BENADRYL//A.C., Onset Date: [id number]   . Shellfish Allergy Swelling    Objective: Temp: 97.9 F (36.6 C) (12/01 0938) Temp Source: Oral (12/01 0938) BP: 163/91 mmHg (12/01 0938) Pulse Rate: 88 (12/01 0938)  General:  His weight is down 5 pounds to 114 Skin:  No rash Lungs:  Slightly decreased breath sounds in left upper lung posteriorly Cor:  Regular S1 and S2 with no murmur Abdomen:  Soft, flat  and nontender Mood and affect: angry and withdrawn. Poor eye contact.  Aspergillus antibody recently decreased from 1:32 to  1:16   Chest x-ray 12/01/2013: no change in left upper lobe cavitary infiltrate with air-fluid level   Assessment: Qusai continues to do very poorly.  I doubt that his pain can be attributed to his aspergillus pneumonia. It does not appear that he is taking voriconazole on a consistent basis. I doubt  that he would be a candidate for left upper lobe resection or that he would even survive such a procedure. I talked him about hospice and palliative care services but he got more angry with me. He states that nobody will listen to him and all he needs is narcotic pain medication.  Plan: 1.  I declined to prescribe narcotic pain medication 2.  I encouraged him to take his voriconazole twice daily every day 3.  Follow-up in 2 months   Michel Bickers, MD [facility] [place] [phone] pager   [phone] cell 12/28/2013, 10:00 AM

## 2014-01-04 ENCOUNTER — Ambulatory Visit (INDEPENDENT_AMBULATORY_CARE_PROVIDER_SITE_OTHER): Payer: Medicare Other | Admitting: Family Medicine

## 2014-01-04 ENCOUNTER — Telehealth: Payer: Self-pay | Admitting: Internal Medicine

## 2014-01-04 ENCOUNTER — Encounter: Payer: Self-pay | Admitting: Family Medicine

## 2014-01-04 VITALS — BP 167/82 | HR 69 | Temp 98.7°F | Wt 118.8 lb

## 2014-01-04 DIAGNOSIS — M10079 Idiopathic gout, unspecified ankle and foot: Secondary | ICD-10-CM

## 2014-01-04 DIAGNOSIS — B449 Aspergillosis, unspecified: Secondary | ICD-10-CM

## 2014-01-04 DIAGNOSIS — G4700 Insomnia, unspecified: Secondary | ICD-10-CM

## 2014-01-04 DIAGNOSIS — R0789 Other chest pain: Secondary | ICD-10-CM

## 2014-01-04 DIAGNOSIS — N400 Enlarged prostate without lower urinary tract symptoms: Secondary | ICD-10-CM

## 2014-01-04 DIAGNOSIS — Z23 Encounter for immunization: Secondary | ICD-10-CM

## 2014-01-04 DIAGNOSIS — M109 Gout, unspecified: Secondary | ICD-10-CM

## 2014-01-04 DIAGNOSIS — G894 Chronic pain syndrome: Secondary | ICD-10-CM

## 2014-01-04 DIAGNOSIS — I10 Essential (primary) hypertension: Secondary | ICD-10-CM

## 2014-01-04 DIAGNOSIS — B009 Herpesviral infection, unspecified: Secondary | ICD-10-CM

## 2014-01-04 LAB — CBC WITH DIFFERENTIAL/PLATELET
BASOS PCT: 0.5 % (ref 0.0–3.0)
Basophils Absolute: 0.1 10*3/uL (ref 0.0–0.1)
Eosinophils Absolute: 1.5 10*3/uL — ABNORMAL HIGH (ref 0.0–0.7)
Eosinophils Relative: 8.2 % — ABNORMAL HIGH (ref 0.0–5.0)
HCT: 30.5 % — ABNORMAL LOW (ref 39.0–52.0)
HEMOGLOBIN: 9.9 g/dL — AB (ref 13.0–17.0)
LYMPHS PCT: 12.5 % (ref 12.0–46.0)
Lymphs Abs: 2.2 10*3/uL (ref 0.7–4.0)
MCHC: 32.4 g/dL (ref 30.0–36.0)
MCV: 85.1 fl (ref 78.0–100.0)
MONOS PCT: 6.7 % (ref 3.0–12.0)
Monocytes Absolute: 1.2 10*3/uL — ABNORMAL HIGH (ref 0.1–1.0)
NEUTROS ABS: 12.7 10*3/uL — AB (ref 1.4–7.7)
Neutrophils Relative %: 72.1 % (ref 43.0–77.0)
Platelets: 347 10*3/uL (ref 150.0–400.0)
RBC: 3.58 Mil/uL — AB (ref 4.22–5.81)
RDW: 16.1 % — ABNORMAL HIGH (ref 11.5–15.5)
WBC: 17.7 10*3/uL — ABNORMAL HIGH (ref 4.0–10.5)

## 2014-01-04 LAB — BASIC METABOLIC PANEL
BUN: 20 mg/dL (ref 6–23)
CO2: 23 meq/L (ref 19–32)
Calcium: 9.8 mg/dL (ref 8.4–10.5)
Chloride: 112 mEq/L (ref 96–112)
Creatinine, Ser: 1.6 mg/dL — ABNORMAL HIGH (ref 0.4–1.5)
GFR: 57.53 mL/min — AB (ref >60.00)
Glucose, Bld: 79 mg/dL (ref 70–99)
Potassium: 4.4 mEq/L (ref 3.5–5.1)
SODIUM: 146 meq/L — AB (ref 135–145)

## 2014-01-04 LAB — POCT URINALYSIS DIPSTICK
Bilirubin, UA: NEGATIVE
Blood, UA: NEGATIVE
Glucose, UA: NEGATIVE
Ketones, UA: NEGATIVE
Leukocytes, UA: NEGATIVE
NITRITE UA: NEGATIVE
PH UA: 5.5
Spec Grav, UA: >=1.03
UROBILINOGEN UA: 0.2

## 2014-01-04 LAB — HEPATIC FUNCTION PANEL
ALK PHOS: 126 U/L — AB (ref 39–117)
ALT: 6 U/L (ref 0–53)
AST: 14 U/L (ref 0–37)
Albumin: 3.4 g/dL — ABNORMAL LOW (ref 3.5–5.2)
Bilirubin, Direct: 0 mg/dL (ref 0.0–0.3)
TOTAL PROTEIN: 8.8 g/dL — AB (ref 6.0–8.3)
Total Bilirubin: 0.2 mg/dL (ref 0.2–1.2)

## 2014-01-04 LAB — URIC ACID: Uric Acid, Serum: 7 mg/dL (ref 4.0–7.8)

## 2014-01-04 MED ORDER — VALACYCLOVIR HCL 1 G PO TABS: 1000.0000 mg | ORAL_TABLET | Freq: Every day | ORAL | Status: AC

## 2014-01-04 MED ORDER — ALLOPURINOL 100 MG PO TABS: 100.0000 mg | ORAL_TABLET | Freq: Every day | ORAL | Status: AC | PRN

## 2014-01-04 MED ORDER — TAMSULOSIN HCL 0.4 MG PO CAPS: 0.4000 mg | ORAL_CAPSULE | Freq: Every day | ORAL | Status: AC

## 2014-01-04 MED ORDER — HYDROCODONE-ACETAMINOPHEN 7.5-325 MG PO TABS: 1.0000 | ORAL_TABLET | Freq: Four times a day (QID) | ORAL | Status: DC | PRN

## 2014-01-04 MED ORDER — HYDROCHLOROTHIAZIDE 12.5 MG PO CAPS: 12.5000 mg | ORAL_CAPSULE | Freq: Every day | ORAL | Status: AC

## 2014-01-04 MED ORDER — AMLODIPINE BESYLATE 10 MG PO TABS: 10.0000 mg | ORAL_TABLET | Freq: Every day | ORAL | Status: AC

## 2014-01-04 NOTE — Progress Notes (Signed)
Pre visit review using our clinic review tool, if applicable. No additional management support is needed unless otherwise documented below in the visit note. 

## 2014-01-04 NOTE — Telephone Encounter (Signed)
Please advise Dr. Alva thanks 

## 2014-01-04 NOTE — Patient Instructions (Signed)

## 2014-01-04 NOTE — Telephone Encounter (Signed)
Called pt and appt scheduled for 02/10/13. Nothing further needed
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
## 2014-01-04 NOTE — Telephone Encounter (Signed)
Fine with me

## 2014-01-04 NOTE — Telephone Encounter (Signed)
OK with me - next opening

## 2014-01-04 NOTE — Telephone Encounter (Signed)
Dr Melvyn Novas, please advise if okay to switch docs Pt wants to go to [facility], so would need to see Dr Elsworth Soho Please advise thanks!

## 2014-01-05 ENCOUNTER — Other Ambulatory Visit: Payer: Self-pay | Admitting: Family Medicine

## 2014-01-05 DIAGNOSIS — N183 Chronic kidney disease, stage 3 unspecified: Secondary | ICD-10-CM

## 2014-01-05 DIAGNOSIS — D6489 Other specified anemias: Secondary | ICD-10-CM

## 2014-01-05 DIAGNOSIS — N184 Chronic kidney disease, stage 4 (severe): Secondary | ICD-10-CM

## 2014-01-06 ENCOUNTER — Telehealth: Payer: Self-pay

## 2014-01-06 MED ORDER — HYDROXYZINE HCL 25 MG PO TABS: 25.0000 mg | ORAL_TABLET | Freq: Three times a day (TID) | ORAL | Status: DC | PRN

## 2014-01-06 NOTE — Telephone Encounter (Signed)
Call from the patient and he stated he has been having a lot of itching lately and he realized most of his medication can cause itching, he wanted to know if he could get something for the itching.    Please advise      KP

## 2014-01-06 NOTE — Telephone Encounter (Signed)
noted 

## 2014-01-06 NOTE — Telephone Encounter (Signed)
Spoke with patient and he stated that he just saw his Nephrologist and he stated his kidney were fine and he was being released from Nephrology. I called over there and Bishop confirmed that the notes are scanned into our system. She stated his kidney were great the last time and his CKD was stable. She said she would get the lab results to [facility] to determine what she would like to do and either give [place] or the patient a call.  Results have been faxed.       KP

## 2014-01-06 NOTE — Telephone Encounter (Signed)
Hydroxyzine 25mg  1 po q8h prn itching----- may be from kidney abnormality

## 2014-01-07 MED ORDER — HYDROXYZINE HCL 10 MG PO TABS: 10.0000 mg | ORAL_TABLET | Freq: Three times a day (TID) | ORAL | Status: DC | PRN

## 2014-01-07 NOTE — Telephone Encounter (Addendum)
Call from [name] [name] and stated Dr.Goldsborough reviewed the labs that were faxed and the patient's CKD is stable, she does not think the itching is being caused by his kidney disease. The patient said the pharmacy did not give him his script because he needs a PA, the 10 mg dose is on the formulary. Please advise      KP

## 2014-01-07 NOTE — Addendum Note (Signed)
Addended by: Ewing Schlein on: 01/07/2014 05:19 PM   Modules accepted: Orders

## 2014-01-07 NOTE — Telephone Encounter (Signed)
patient has been made aware the Rx has been sent.      KP

## 2014-01-07 NOTE — Telephone Encounter (Signed)
Hydroxyzine 10 mg 1-2 po q8h prn itching  #60

## 2014-01-11 ENCOUNTER — Telehealth: Payer: Self-pay | Admitting: Family Medicine

## 2014-01-11 NOTE — Telephone Encounter (Signed)
Caller name:Brundage Greg Relation to CX:FQHK Call back number:[phone] Pharmacy:  Reason for call: pt states he is returning your call please call back.

## 2014-01-11 NOTE — Telephone Encounter (Signed)
States that he has not started anything new in the last several weeks.  He has not taken a antihistamine to see if it will relieve the rash and itching.  States that he has been taking the HCTZ for a while. From the records he has been on it for at least a year.  Please advise

## 2014-01-11 NOTE — Telephone Encounter (Signed)
What was the last med started?   How long has he been on hctz--- that could cause a rash and itching.

## 2014-01-11 NOTE — Telephone Encounter (Signed)
spoke with patient and he stated he wanted Dr.Lowne to call him, he said he called yesterday and never got a call back. Patient can not get the hydroxyzine and he said now he has rash all over the body and he thinks it is one of his medications. He said the rash is on his back, abdomen and right thiih. He also does not have an appetite and concerned that one of his medication is the cause. He does not think it was the gout med's, HTN med's or the pain med's because he has been taking it a long time. Please advise      KP

## 2014-01-11 NOTE — Telephone Encounter (Signed)
Ideally it would be best to see it --  He may need a steroid?  Or other med.   Any animals etc?----- i recommend an ov --- it was just itching before if there is a rash now one of [place] should see it.

## 2014-01-12 NOTE — Telephone Encounter (Signed)
Maybe he can try benadryl and  aveeno oatmeal bath in meantime for itching

## 2014-01-12 NOTE — Telephone Encounter (Signed)
Patient called back. Would not discuss anything with me. Would like a callback.

## 2014-01-12 NOTE — Telephone Encounter (Signed)
Spoke with patient and advised per recommendations. Will try to schedule with PCP for tomorrow. Calling back if he can come in.

## 2014-01-12 NOTE — Telephone Encounter (Signed)
Rescheduled appt to Thursday 01/13/2014 at 245pm. Patient states that he will have a ride at 230 to bring him.

## 2014-01-12 NOTE — Telephone Encounter (Signed)
Patient does not have any transportation. I put him in for next Tuesday and he said he will try to keep. Has to let his transportation service know a week in advance and he has other things scheduled.

## 2014-01-13 ENCOUNTER — Ambulatory Visit (INDEPENDENT_AMBULATORY_CARE_PROVIDER_SITE_OTHER): Payer: Medicare Other | Admitting: Family Medicine

## 2014-01-13 ENCOUNTER — Encounter: Payer: Self-pay | Admitting: Family Medicine

## 2014-01-13 VITALS — BP 116/68 | HR 72 | Temp 99.3°F | Wt 116.6 lb

## 2014-01-13 DIAGNOSIS — R21 Rash and other nonspecific skin eruption: Secondary | ICD-10-CM

## 2014-01-13 MED ORDER — METHYLPREDNISOLONE ACETATE 80 MG/ML IJ SUSP
80.0000 mg | Freq: Once | INTRAMUSCULAR | Status: AC
  Administered 2014-01-13: 80 mg via INTRAMUSCULAR

## 2014-01-13 MED ORDER — LINDANE 1 % EX LOTN: 1.0000 "application " | TOPICAL_LOTION | Freq: Once | CUTANEOUS | Status: DC

## 2014-01-13 MED ORDER — PREDNISONE 10 MG PO TABS: ORAL_TABLET | ORAL | Status: DC

## 2014-01-13 NOTE — Progress Notes (Signed)
Pre visit review using our clinic review tool, if applicable. No additional management support is needed unless otherwise documented below in the visit note. 

## 2014-01-13 NOTE — Progress Notes (Signed)
   Subjective:    Patient ID: [name] [name], male    DOB: [birth year], 68 y.o.   MRN: [id number]  HPI Pt here c/o rash on abd, back and groin that is very pruritic.  No animals in home and he lives by himself.  No new meds or adjustment on doses of meds.  No new lotions, soaps, detergents.  Rash started after last visit.     Review of Systems    as above Objective:   Physical Exam BP 116/68 mmHg  Pulse 72  Temp(Src) 99.3 F (37.4 C) (Oral)  Wt 116 lb 9.6 oz (52.889 kg)  SpO2 97% General appearance: alert, cooperative, appears stated age and no distress Skin: Skin color, texture, turgor normal. No rashes or lesions or papular - groin  Papules and dry skin on back and low abd      Assessment & Plan:  1. Rash and nonspecific skin eruption Suspect scabies---rto prn - predniSONE (DELTASONE) 10 MG tablet; 3 po qd for 3 days then 2 po qd for 3 days the 1 po qd for 3 days  Dispense: 18 tablet; Refill: 0 - lindane lotion (KWELL) 1 %; Apply 1 application topically once.  Dispense: 30 mL; Refill: 0

## 2014-01-13 NOTE — Patient Instructions (Signed)

## 2014-01-17 ENCOUNTER — Telehealth: Payer: Self-pay

## 2014-01-17 MED ORDER — PERMETHRIN 5 % EX CREA: 1.0000 "application " | TOPICAL_CREAM | Freq: Once | CUTANEOUS | Status: DC

## 2014-01-17 NOTE — Telephone Encounter (Signed)
Kwell is no longer manufactured. Rx for Elimite sent to the pharmacy.      KP

## 2014-01-17 NOTE — Telephone Encounter (Signed)
[name] [name]  [phone]  Herbie Baltimore called and would like for you to call him back he stated that it was important.

## 2014-01-18 ENCOUNTER — Ambulatory Visit: Payer: Medicare Other | Admitting: Family Medicine

## 2014-02-01 ENCOUNTER — Encounter: Payer: Self-pay | Admitting: Family Medicine

## 2014-02-10 ENCOUNTER — Ambulatory Visit (INDEPENDENT_AMBULATORY_CARE_PROVIDER_SITE_OTHER): Payer: Medicare Other | Admitting: Pulmonary Disease

## 2014-02-10 ENCOUNTER — Other Ambulatory Visit: Payer: Self-pay | Admitting: Pulmonary Disease

## 2014-02-10 ENCOUNTER — Encounter: Payer: Self-pay | Admitting: Pulmonary Disease

## 2014-02-10 ENCOUNTER — Telehealth: Payer: Self-pay | Admitting: Family Medicine

## 2014-02-10 VITALS — BP 138/78 | HR 64 | Temp 98.5°F | Ht 66.5 in | Wt 120.0 lb

## 2014-02-10 DIAGNOSIS — B449 Aspergillosis, unspecified: Secondary | ICD-10-CM | POA: Diagnosis not present

## 2014-02-10 DIAGNOSIS — J850 Gangrene and necrosis of lung: Secondary | ICD-10-CM

## 2014-02-10 DIAGNOSIS — G894 Chronic pain syndrome: Secondary | ICD-10-CM

## 2014-02-10 NOTE — Assessment & Plan Note (Signed)
Continue bronchodilator therapy Smoking cessation was again emphasized

## 2014-02-10 NOTE — Patient Instructions (Addendum)
Blood work - CBC, CMET CT chest - no contrast Based on this , I will discuss with dr Megan Salon Stay on Vfend

## 2014-02-10 NOTE — Telephone Encounter (Signed)
Ok to refill x1--- need uds

## 2014-02-10 NOTE — Assessment & Plan Note (Signed)
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 I wonder if we need to revisit this diagnosis here. Would start with repeating his CT scan We will also obtain a CBC with differential to see if leukocytosis is worsening-if so we'll treat him for a superimposed bacterial infection. Will discuss with Dr. Megan Salon, but would be open to performing another bronchoscopy His lung function may permit resection (note low DLCO) , but his overall health may not-in other words he may be inoperable Malignancy seems to have been ruled out conclusively.

## 2014-02-10 NOTE — Progress Notes (Signed)
Subjective:    Patient ID: [name] [name], male    DOB: 1945-05-05, 69 y.o.   MRN: [id number]  HPI  67 yobm smoker sp vats RULobectomy for NSCLC 2008 referred to pulmonary 10/28/2012 by Dr Hilarie Fredrickson (whom he saw for chronic pancreatitis) for copd eval Last seen by wert in 12/07/2013  Significant tests/ events  - 07/2013 referred to HD > w/u for MTB neg > sputum from 08/06/13 growing M gordonae - FOB 09/16/13 > copious mp secretions LUL> Fungal hyphae/necrosisbut neg fungal cultures, afb cx neg > ID referral 09/17/2013 > verconazole started 10/02/2013  -CT chest 09/2013- Extensive infiltrative/inflammatory process in the left lung with large cavitary process with air-fluid level in the left upper lung. PFTS 11/2012 - no airway obstruction, air trapping, DLCO 51% consistent with emphysema  He developed a pneumonia in the left upper lobe 06/2013, which progressively got worse and 07/2013 and was noted to be necrotizing on chest x-ray in 08/2013 and again on CT chest in 09/2013. Extremely high IgE level was noted, he is also followed by Dr. Megan Salon from [facility] His main complaint continues to be pain in his left lung, now he also reports pain radiating on the right side and to his jaw. This is partially relieved by hydrocodone for a period of time. He is quite frustrated since he has not found relief after seeing multiple doctors. He was placed on voriconazole -he does claim compliance, but there has been some doubts about this. He continues to smoke a few cigarettes a day  Past Medical History  Diagnosis Date  . Peripheral vascular disease     with claudication  . Pancreatitis     chronic  . Hypertension   . Glaucoma   . COPD (chronic obstructive pulmonary disease)   . Ulcer     peptic ulcer disease  . H/O alcohol abuse   . History of tobacco abuse   . PONV (postoperative nausea and vomiting)   . Anemia   . GERD (gastroesophageal reflux disease)     "NOT TO BAD NOW"  . Carpal tunnel  syndrome, bilateral   . Colon polyps 12/2011  . Prostate carcinoma   . Lung cancer, upper lobe 2007  . 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 Necrotizing pneumonia     09/2013      Review of Systems  Constitutional: Positive for unexpected weight change. Negative for fever.  HENT: Positive for sinus pressure. Negative for congestion, dental problem, ear pain, nosebleeds, postnasal drip, rhinorrhea, sneezing, sore throat and trouble swallowing.   Eyes: Negative for redness and itching.  Respiratory: Positive for cough, chest tightness, shortness of breath and wheezing.   Cardiovascular: Positive for chest pain. Negative for palpitations and leg swelling.  Gastrointestinal: Negative for nausea and vomiting.  Genitourinary: Negative for dysuria.  Musculoskeletal: Negative for joint swelling.  Skin: Negative for rash.  Neurological: Negative for headaches.  Hematological: Does not bruise/bleed easily.  Psychiatric/Behavioral: Negative for dysphoric mood. The patient is not nervous/anxious.        Objective:   Physical Exam   Gen. Pleasant, cachectic, in no distress, normal affect ENT - no lesions, no post nasal drip Neck: No JVD, no thyromegaly, no carotid bruits Lungs: no use of accessory muscles, no dullness to percussion, decreased without rales or rhonchi  Cardiovascular: Rhythm regular, heart sounds  normal, no murmurs or gallops, no peripheral edema Abdomen: soft and non-tender, no hepatosplenomegaly, BS normal. Musculoskeletal: No deformities, no cyanosis , 1+ clubbing  Neuro:  alert, non focal        Assessment & Plan:

## 2014-02-10 NOTE — Telephone Encounter (Signed)
Hydrocodone requested  Last seen 01/13/14 and filled 01/04/14 #60 UDS 12.8.15 +Marijuana  Patient denied using Marijuana  Please advise     KP

## 2014-02-10 NOTE — Telephone Encounter (Signed)
Caller name: Eligio Relation to pt: Call back number: [phone]  Reason for call: Pt requested to be called by Maudie Mercury to consult about medication. Please Advise.

## 2014-02-11 ENCOUNTER — Telehealth: Payer: Self-pay | Admitting: Family Medicine

## 2014-02-11 LAB — CBC WITH DIFFERENTIAL/PLATELET
BASOS ABS: 0 10*3/uL (ref 0.0–0.1)
BASOS PCT: 0 % (ref 0–1)
EOS ABS: 1.1 10*3/uL — AB (ref 0.0–0.7)
EOS PCT: 6 % — AB (ref 0–5)
HEMATOCRIT: 32.2 % — AB (ref 39.0–52.0)
HEMOGLOBIN: 10.6 g/dL — AB (ref 13.0–17.0)
Lymphocytes Relative: 13 % (ref 12–46)
Lymphs Abs: 2.3 10*3/uL (ref 0.7–4.0)
MCH: 27.9 pg (ref 26.0–34.0)
MCHC: 32.9 g/dL (ref 30.0–36.0)
MCV: 84.7 fL (ref 78.0–100.0)
MONO ABS: 1.1 10*3/uL — AB (ref 0.1–1.0)
MONOS PCT: 6 % (ref 3–12)
MPV: 10 fL (ref 8.6–12.4)
Neutro Abs: 13.1 10*3/uL — ABNORMAL HIGH (ref 1.7–7.7)
Neutrophils Relative %: 75 % (ref 43–77)
Platelets: 341 10*3/uL (ref 150–400)
RBC: 3.8 MIL/uL — ABNORMAL LOW (ref 4.22–5.81)
RDW: 16.2 % — AB (ref 11.5–15.5)
WBC: 17.5 10*3/uL — AB (ref 4.0–10.5)

## 2014-02-11 MED ORDER — HYDROCODONE-ACETAMINOPHEN 7.5-325 MG PO TABS: 1.0000 | ORAL_TABLET | Freq: Four times a day (QID) | ORAL | Status: DC | PRN

## 2014-02-11 NOTE — Telephone Encounter (Signed)
It takes several weeks to get out  Of systerm

## 2014-02-11 NOTE — Telephone Encounter (Signed)
Patient aware Rx ready for pick up.  He said he was smoking and he since stopped because he was trying to get into a government programs ands they would not accept him into the Marijuana was out of his system. He is not sure if it would be completely out at this time  But he insists that he is not smoking because he is going to try to get back into the government assistance program. He is aware he would need another UDS.    KP

## 2014-02-11 NOTE — Telephone Encounter (Signed)
emmi mailed  °

## 2014-02-14 ENCOUNTER — Telehealth: Payer: Self-pay | Admitting: Pulmonary Disease

## 2014-02-14 ENCOUNTER — Telehealth: Payer: Self-pay | Admitting: Family Medicine

## 2014-02-14 ENCOUNTER — Ambulatory Visit (INDEPENDENT_AMBULATORY_CARE_PROVIDER_SITE_OTHER): Payer: Medicare Other | Admitting: Family Medicine

## 2014-02-14 ENCOUNTER — Encounter: Payer: Self-pay | Admitting: Family Medicine

## 2014-02-14 VITALS — BP 127/82 | HR 88 | Temp 98.7°F | Wt 119.0 lb

## 2014-02-14 DIAGNOSIS — M549 Dorsalgia, unspecified: Secondary | ICD-10-CM | POA: Diagnosis not present

## 2014-02-14 DIAGNOSIS — G894 Chronic pain syndrome: Secondary | ICD-10-CM

## 2014-02-14 MED ORDER — ALPRAZOLAM 0.5 MG PO TABS: ORAL_TABLET | ORAL | Status: AC

## 2014-02-14 NOTE — Telephone Encounter (Signed)
Please advise      KP 

## 2014-02-14 NOTE — Patient Instructions (Signed)

## 2014-02-14 NOTE — Telephone Encounter (Signed)
I just know he has one--- it was done outside [facility], i believe --please ask patient where it was done  We will need to get records if not in care everywhere

## 2014-02-14 NOTE — Progress Notes (Signed)
Pre visit review using our clinic review tool, if applicable. No additional management support is needed unless otherwise documented below in the visit note. 

## 2014-02-14 NOTE — Telephone Encounter (Signed)
Let radiology know we don't know any details---we do need to get those records

## 2014-02-14 NOTE — Telephone Encounter (Signed)
Caller name: alana from [facility] Relation to pt: Call back number: [phone] Pharmacy:  Reason for call:     Alana from [facility] wants to know if Dr. Etter Sjogren knows angthing about the stint information??

## 2014-02-14 NOTE — Telephone Encounter (Signed)
Spoke with patient and he stated he got his sent put in in the 90's, He said he had stent's in both legs and 1 in the stomach. He said that Dr.Spruill may still have his records.  Please advise     KP

## 2014-02-15 NOTE — Telephone Encounter (Signed)
The imaging was ok'd with a Tech. There are some records also scanned in the patient's chart.     KP

## 2014-02-16 ENCOUNTER — Telehealth: Payer: Self-pay | Admitting: Pulmonary Disease

## 2014-02-16 DIAGNOSIS — D72829 Elevated white blood cell count, unspecified: Secondary | ICD-10-CM

## 2014-02-16 NOTE — Telephone Encounter (Signed)
On the lab results, you documented but was the pt made aware of labs?

## 2014-02-16 NOTE — Telephone Encounter (Signed)
Closed TE in error.  Yes, Pt was made aware of labs.  Thanks.

## 2014-02-16 NOTE — Progress Notes (Signed)
   Subjective:    Patient ID: [name] [name], male    DOB: [birth year], 69 y.o.   MRN: [id number]  HPI Pt here c/o worsening back pain.  He is taking 2 pain tabs regularly.  No new injury.  He is willing to see pain management.   Review of Systems  Respiratory: Negative for chest tightness and shortness of breath.   Cardiovascular: Negative for chest pain and palpitations.  Musculoskeletal: Positive for back pain and gait problem. Negative for neck pain and neck stiffness.  Neurological: Positive for weakness. Negative for dizziness, tremors, seizures, syncope, facial asymmetry, speech difficulty, light-headedness, numbness and headaches.  Psychiatric/Behavioral: Positive for dysphoric mood. Negative for hallucinations, behavioral problems, confusion, decreased concentration and agitation.       Objective:   Physical Exam BP 127/82 mmHg  Pulse 88  Temp(Src) 98.7 F (37.1 C) (Oral)  Wt 119 lb (53.978 kg)  SpO2 99% General appearance: alert, cooperative, appears stated age and no distress Neck: no adenopathy, supple, symmetrical, trachea midline and thyroid not enlarged, symmetric, no tenderness/mass/nodules Lungs: clear to auscultation bilaterally Heart: S1, S2 normal Extremities: extremities normal, atraumatic, no cyanosis or edema  Neuro-- atalgic gait      Assessment & Plan:  1. Chronic pain syndrome   - Ambulatory referral to Pain Clinic  2. Back pain with radiation Worsening pain - MR Lumbar Spine Wo Contrast; Future - ALPRAZolam (XANAX) 0.5 MG tablet; Take 1 tab 30 min prior to procedure and tid prn  Dispense: 10 tablet; Refill: 0

## 2014-02-16 NOTE — Telephone Encounter (Signed)
Patient having a lot of aches and pains in the chest area through to the back. SOB, having to use rescue inhaler 2 puffs, 4 times daily.. Patient says that he stopped taking iron tablets and does not want to take them again. He wants to know what is causing his blood count to be high and what can be done to treat it?

## 2014-02-16 NOTE — Progress Notes (Signed)
Quick Note:  Patient notified. No questions or concerns at this time. Nothing further needed.  ______

## 2014-02-17 MED ORDER — LEVOFLOXACIN 500 MG PO TABS: 500.0000 mg | ORAL_TABLET | Freq: Every day | ORAL | Status: DC

## 2014-02-17 NOTE — Telephone Encounter (Signed)
Patient notified.  No questions or concerns at this time. Nothing further needed. Orders entered.  Levaquin sent to pharmacy.

## 2014-02-17 NOTE — Telephone Encounter (Signed)
Ct voricaonazole Start levaquin 500 daily x 7days Then rpt CBC

## 2014-02-18 ENCOUNTER — Ambulatory Visit (HOSPITAL_BASED_OUTPATIENT_CLINIC_OR_DEPARTMENT_OTHER): Payer: Medicare Other

## 2014-02-23 ENCOUNTER — Ambulatory Visit
Admission: RE | Admit: 2014-02-23 | Discharge: 2014-02-23 | Disposition: A | Discharge status: Home / Self Care | Payer: Medicare Other | Source: Ambulatory Visit | Attending: Family Medicine | Admitting: Family Medicine

## 2014-02-23 DIAGNOSIS — R29898 Other symptoms and signs involving the musculoskeletal system: Secondary | ICD-10-CM | POA: Diagnosis not present

## 2014-02-23 DIAGNOSIS — M4317 Spondylolisthesis, lumbosacral region: Secondary | ICD-10-CM | POA: Diagnosis not present

## 2014-02-23 DIAGNOSIS — M5126 Other intervertebral disc displacement, lumbar region: Secondary | ICD-10-CM | POA: Diagnosis not present

## 2014-02-23 DIAGNOSIS — M549 Dorsalgia, unspecified: Secondary | ICD-10-CM

## 2014-02-24 ENCOUNTER — Telehealth: Payer: Self-pay | Admitting: Family Medicine

## 2014-02-24 NOTE — Telephone Encounter (Signed)
Caller name: Orie Relation to pt: self Call back number: [phone] Pharmacy:  Reason for call:   Patient called in wanting to talk to [facility].  It's regarding what you talked about last week to him. He would not go into detail with me

## 2014-02-24 NOTE — Telephone Encounter (Signed)
Patient has a paper to have completed and since he has transportation issues he wanted to know if he could get the paper completed while he is getting his CT scan tomorrow. I advised to leave it at the desk and we will take care of it for him. He verbalized understanding and has agreed.      KP

## 2014-02-24 NOTE — Telephone Encounter (Signed)
MSG left to call the office      KP 

## 2014-02-25 ENCOUNTER — Ambulatory Visit (HOSPITAL_BASED_OUTPATIENT_CLINIC_OR_DEPARTMENT_OTHER)
Admission: RE | Admit: 2014-02-25 | Discharge: 2014-02-25 | Disposition: A | Discharge status: Home / Self Care | Payer: Medicare Other | Source: Ambulatory Visit | Attending: Pulmonary Disease | Admitting: Pulmonary Disease

## 2014-02-25 ENCOUNTER — Other Ambulatory Visit: Payer: Self-pay

## 2014-02-25 ENCOUNTER — Other Ambulatory Visit: Payer: Self-pay | Admitting: Pulmonary Disease

## 2014-02-25 ENCOUNTER — Other Ambulatory Visit (INDEPENDENT_AMBULATORY_CARE_PROVIDER_SITE_OTHER): Payer: Medicare Other

## 2014-02-25 DIAGNOSIS — B449 Aspergillosis, unspecified: Secondary | ICD-10-CM

## 2014-02-25 DIAGNOSIS — R740 Nonspecific elevation of levels of transaminase and lactic acid dehydrogenase [LDH]: Secondary | ICD-10-CM | POA: Diagnosis not present

## 2014-02-25 DIAGNOSIS — J158 Pneumonia due to other specified bacteria: Secondary | ICD-10-CM | POA: Diagnosis not present

## 2014-02-25 DIAGNOSIS — R7402 Elevation of levels of lactic acid dehydrogenase (LDH): Secondary | ICD-10-CM

## 2014-02-25 DIAGNOSIS — Z85118 Personal history of other malignant neoplasm of bronchus and lung: Secondary | ICD-10-CM | POA: Diagnosis not present

## 2014-02-25 DIAGNOSIS — C3490 Malignant neoplasm of unspecified part of unspecified bronchus or lung: Secondary | ICD-10-CM | POA: Diagnosis not present

## 2014-02-25 DIAGNOSIS — R918 Other nonspecific abnormal finding of lung field: Secondary | ICD-10-CM

## 2014-02-25 LAB — CBC WITH DIFFERENTIAL/PLATELET
Basophils Absolute: 0 10*3/uL (ref 0.0–0.1)
Basophils Relative: 0 % (ref 0–1)
Eosinophils Absolute: 1 10*3/uL — ABNORMAL HIGH (ref 0.0–0.7)
Eosinophils Relative: 5 % (ref 0–5)
HCT: 31.2 % — ABNORMAL LOW (ref 39.0–52.0)
HEMOGLOBIN: 10 g/dL — AB (ref 13.0–17.0)
LYMPHS PCT: 11 % — AB (ref 12–46)
Lymphs Abs: 2.1 10*3/uL (ref 0.7–4.0)
MCH: 27.2 pg (ref 26.0–34.0)
MCHC: 32.1 g/dL (ref 30.0–36.0)
MCV: 84.8 fL (ref 78.0–100.0)
MONOS PCT: 8 % (ref 3–12)
MPV: 9.8 fL (ref 8.6–12.4)
Monocytes Absolute: 1.5 10*3/uL — ABNORMAL HIGH (ref 0.1–1.0)
NEUTROS PCT: 76 % (ref 43–77)
Neutro Abs: 14.7 10*3/uL — ABNORMAL HIGH (ref 1.7–7.7)
Platelets: 409 10*3/uL — ABNORMAL HIGH (ref 150–400)
RBC: 3.68 MIL/uL — ABNORMAL LOW (ref 4.22–5.81)
RDW: 16.2 % — ABNORMAL HIGH (ref 11.5–15.5)
WBC: 19.3 10*3/uL — ABNORMAL HIGH (ref 4.0–10.5)

## 2014-02-25 LAB — COMPREHENSIVE METABOLIC PANEL
AST: 10 U/L (ref 0–37)
Albumin: 3 g/dL — ABNORMAL LOW (ref 3.5–5.2)
Alkaline Phosphatase: 141 U/L — ABNORMAL HIGH (ref 39–117)
BUN: 19 mg/dL (ref 6–23)
CALCIUM: 9 mg/dL (ref 8.4–10.5)
CHLORIDE: 101 meq/L (ref 96–112)
CO2: 27 mEq/L (ref 19–32)
Creat: 1.47 mg/dL — ABNORMAL HIGH (ref 0.50–1.35)
Glucose, Bld: 85 mg/dL (ref 70–99)
Potassium: 4.1 mEq/L (ref 3.5–5.3)
SODIUM: 140 meq/L (ref 135–145)
Total Bilirubin: 0.3 mg/dL (ref 0.2–1.2)
Total Protein: 6.4 g/dL (ref 6.0–8.3)

## 2014-02-25 LAB — HEPATIC FUNCTION PANEL
ALK PHOS: 140 U/L — AB (ref 39–117)
ALT: 4 U/L (ref 0–53)
AST: 10 U/L (ref 0–37)
Albumin: 3.2 g/dL — ABNORMAL LOW (ref 3.5–5.2)
BILIRUBIN TOTAL: 0.3 mg/dL (ref 0.2–1.2)
Bilirubin, Direct: 0.1 mg/dL (ref 0.0–0.3)
TOTAL PROTEIN: 7.4 g/dL (ref 6.0–8.3)

## 2014-02-25 MED ORDER — HYDROCODONE-ACETAMINOPHEN 7.5-325 MG PO TABS: 1.0000 | ORAL_TABLET | Freq: Four times a day (QID) | ORAL | Status: AC | PRN

## 2014-02-25 NOTE — Telephone Encounter (Signed)
Rx given to the patient.       KP

## 2014-02-25 NOTE — Telephone Encounter (Signed)
Last seen 02/14/14 and filled 02/11/14 #60 Current UDS pending Previous UDS +Marijuana   Please advise      KP

## 2014-02-25 NOTE — Telephone Encounter (Signed)
Verbal for #90. Printed and given to Flora to sign    KP

## 2014-02-28 NOTE — Addendum Note (Signed)
Addended by: Parke Poisson E on: 02/28/2014 10:04 AM   Modules accepted: Orders

## 2014-02-28 NOTE — Progress Notes (Signed)
CT biopsy order was placed as a referral - this has been corrected.

## 2014-03-01 ENCOUNTER — Ambulatory Visit: Payer: Medicare Other | Admitting: Internal Medicine

## 2014-03-01 DIAGNOSIS — H401212 Low-tension glaucoma, right eye, moderate stage: Secondary | ICD-10-CM | POA: Diagnosis not present

## 2014-03-01 DIAGNOSIS — H25813 Combined forms of age-related cataract, bilateral: Secondary | ICD-10-CM | POA: Diagnosis not present

## 2014-03-02 ENCOUNTER — Other Ambulatory Visit: Payer: Self-pay

## 2014-03-02 ENCOUNTER — Other Ambulatory Visit: Payer: Self-pay | Admitting: Pulmonary Disease

## 2014-03-02 ENCOUNTER — Other Ambulatory Visit: Payer: Self-pay | Admitting: *Deleted

## 2014-03-02 DIAGNOSIS — R918 Other nonspecific abnormal finding of lung field: Secondary | ICD-10-CM

## 2014-03-02 DIAGNOSIS — M48061 Spinal stenosis, lumbar region without neurogenic claudication: Secondary | ICD-10-CM

## 2014-03-04 ENCOUNTER — Emergency Department (HOSPITAL_COMMUNITY): Payer: Medicare Other

## 2014-03-04 ENCOUNTER — Telehealth: Payer: Self-pay | Admitting: *Deleted

## 2014-03-04 ENCOUNTER — Inpatient Hospital Stay (HOSPITAL_COMMUNITY)
Admission: EM | Admit: 2014-03-04 | Discharge: 2014-03-29 | DRG: 207 | Disposition: E | Payer: Medicare Other | Attending: Internal Medicine | Admitting: Internal Medicine

## 2014-03-04 ENCOUNTER — Encounter (HOSPITAL_COMMUNITY): Payer: Self-pay | Admitting: Emergency Medicine

## 2014-03-04 DIAGNOSIS — R918 Other nonspecific abnormal finding of lung field: Secondary | ICD-10-CM | POA: Diagnosis present

## 2014-03-04 DIAGNOSIS — J449 Chronic obstructive pulmonary disease, unspecified: Secondary | ICD-10-CM | POA: Diagnosis present

## 2014-03-04 DIAGNOSIS — I471 Supraventricular tachycardia: Secondary | ICD-10-CM | POA: Diagnosis present

## 2014-03-04 DIAGNOSIS — D62 Acute posthemorrhagic anemia: Secondary | ICD-10-CM | POA: Diagnosis present

## 2014-03-04 DIAGNOSIS — Z6823 Body mass index (BMI) 23.0-23.9, adult: Secondary | ICD-10-CM

## 2014-03-04 DIAGNOSIS — T380X5A Adverse effect of glucocorticoids and synthetic analogues, initial encounter: Secondary | ICD-10-CM | POA: Diagnosis present

## 2014-03-04 DIAGNOSIS — J810 Acute pulmonary edema: Secondary | ICD-10-CM | POA: Diagnosis not present

## 2014-03-04 DIAGNOSIS — I739 Peripheral vascular disease, unspecified: Secondary | ICD-10-CM | POA: Diagnosis not present

## 2014-03-04 DIAGNOSIS — R739 Hyperglycemia, unspecified: Secondary | ICD-10-CM | POA: Diagnosis present

## 2014-03-04 DIAGNOSIS — R042 Hemoptysis: Secondary | ICD-10-CM | POA: Diagnosis present

## 2014-03-04 DIAGNOSIS — I4891 Unspecified atrial fibrillation: Secondary | ICD-10-CM | POA: Diagnosis not present

## 2014-03-04 DIAGNOSIS — Z79899 Other long term (current) drug therapy: Secondary | ICD-10-CM | POA: Diagnosis not present

## 2014-03-04 DIAGNOSIS — E43 Unspecified severe protein-calorie malnutrition: Secondary | ICD-10-CM | POA: Diagnosis not present

## 2014-03-04 DIAGNOSIS — R578 Other shock: Secondary | ICD-10-CM | POA: Diagnosis not present

## 2014-03-04 DIAGNOSIS — R4689 Other symptoms and signs involving appearance and behavior: Secondary | ICD-10-CM | POA: Diagnosis not present

## 2014-03-04 DIAGNOSIS — J189 Pneumonia, unspecified organism: Secondary | ICD-10-CM

## 2014-03-04 DIAGNOSIS — B449 Aspergillosis, unspecified: Secondary | ICD-10-CM | POA: Diagnosis not present

## 2014-03-04 DIAGNOSIS — T17808A Unspecified foreign body in other parts of respiratory tract causing other injury, initial encounter: Secondary | ICD-10-CM | POA: Diagnosis present

## 2014-03-04 DIAGNOSIS — K219 Gastro-esophageal reflux disease without esophagitis: Secondary | ICD-10-CM | POA: Diagnosis present

## 2014-03-04 DIAGNOSIS — Z9889 Other specified postprocedural states: Secondary | ICD-10-CM

## 2014-03-04 DIAGNOSIS — B440 Invasive pulmonary aspergillosis: Secondary | ICD-10-CM | POA: Diagnosis present

## 2014-03-04 DIAGNOSIS — N170 Acute kidney failure with tubular necrosis: Secondary | ICD-10-CM | POA: Diagnosis not present

## 2014-03-04 DIAGNOSIS — R0902 Hypoxemia: Secondary | ICD-10-CM

## 2014-03-04 DIAGNOSIS — Z8546 Personal history of malignant neoplasm of prostate: Secondary | ICD-10-CM

## 2014-03-04 DIAGNOSIS — Z515 Encounter for palliative care: Secondary | ICD-10-CM | POA: Diagnosis not present

## 2014-03-04 DIAGNOSIS — Z809 Family history of malignant neoplasm, unspecified: Secondary | ICD-10-CM

## 2014-03-04 DIAGNOSIS — F17210 Nicotine dependence, cigarettes, uncomplicated: Secondary | ICD-10-CM | POA: Diagnosis not present

## 2014-03-04 DIAGNOSIS — Z66 Do not resuscitate: Secondary | ICD-10-CM | POA: Diagnosis present

## 2014-03-04 DIAGNOSIS — J9811 Atelectasis: Secondary | ICD-10-CM

## 2014-03-04 DIAGNOSIS — Z86010 Personal history of colonic polyps: Secondary | ICD-10-CM

## 2014-03-04 DIAGNOSIS — J9621 Acute and chronic respiratory failure with hypoxia: Secondary | ICD-10-CM | POA: Diagnosis present

## 2014-03-04 DIAGNOSIS — J9600 Acute respiratory failure, unspecified whether with hypoxia or hypercapnia: Secondary | ICD-10-CM | POA: Insufficient documentation

## 2014-03-04 DIAGNOSIS — D630 Anemia in neoplastic disease: Secondary | ICD-10-CM | POA: Diagnosis present

## 2014-03-04 DIAGNOSIS — C3412 Malignant neoplasm of upper lobe, left bronchus or lung: Principal | ICD-10-CM | POA: Diagnosis present

## 2014-03-04 DIAGNOSIS — I129 Hypertensive chronic kidney disease with stage 1 through stage 4 chronic kidney disease, or unspecified chronic kidney disease: Secondary | ICD-10-CM | POA: Diagnosis not present

## 2014-03-04 DIAGNOSIS — Z8619 Personal history of other infectious and parasitic diseases: Secondary | ICD-10-CM

## 2014-03-04 DIAGNOSIS — N183 Chronic kidney disease, stage 3 unspecified: Secondary | ICD-10-CM | POA: Diagnosis present

## 2014-03-04 DIAGNOSIS — M4322 Fusion of spine, cervical region: Secondary | ICD-10-CM | POA: Diagnosis present

## 2014-03-04 DIAGNOSIS — Z9289 Personal history of other medical treatment: Secondary | ICD-10-CM

## 2014-03-04 DIAGNOSIS — J9601 Acute respiratory failure with hypoxia: Secondary | ICD-10-CM

## 2014-03-04 DIAGNOSIS — Z7189 Other specified counseling: Secondary | ICD-10-CM

## 2014-03-04 DIAGNOSIS — Z811 Family history of alcohol abuse and dependence: Secondary | ICD-10-CM

## 2014-03-04 DIAGNOSIS — C781 Secondary malignant neoplasm of mediastinum: Secondary | ICD-10-CM | POA: Diagnosis not present

## 2014-03-04 DIAGNOSIS — T7431XA Adult psychological abuse, confirmed, initial encounter: Secondary | ICD-10-CM

## 2014-03-04 DIAGNOSIS — Z978 Presence of other specified devices: Secondary | ICD-10-CM

## 2014-03-04 DIAGNOSIS — J9620 Acute and chronic respiratory failure, unspecified whether with hypoxia or hypercapnia: Secondary | ICD-10-CM

## 2014-03-04 DIAGNOSIS — Z85118 Personal history of other malignant neoplasm of bronchus and lung: Secondary | ICD-10-CM | POA: Diagnosis not present

## 2014-03-04 DIAGNOSIS — R0603 Acute respiratory distress: Secondary | ICD-10-CM

## 2014-03-04 DIAGNOSIS — IMO0001 Reserved for inherently not codable concepts without codable children: Secondary | ICD-10-CM

## 2014-03-04 DIAGNOSIS — Z9114 Patient's other noncompliance with medication regimen: Secondary | ICD-10-CM | POA: Diagnosis present

## 2014-03-04 DIAGNOSIS — Z902 Acquired absence of lung [part of]: Secondary | ICD-10-CM | POA: Diagnosis present

## 2014-03-04 DIAGNOSIS — Y95 Nosocomial condition: Secondary | ICD-10-CM | POA: Diagnosis present

## 2014-03-04 DIAGNOSIS — J690 Pneumonitis due to inhalation of food and vomit: Secondary | ICD-10-CM | POA: Diagnosis not present

## 2014-03-04 DIAGNOSIS — C3492 Malignant neoplasm of unspecified part of left bronchus or lung: Secondary | ICD-10-CM | POA: Diagnosis not present

## 2014-03-04 DIAGNOSIS — K567 Ileus, unspecified: Secondary | ICD-10-CM
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
## 2014-03-04 NOTE — ED Notes (Signed)
Pt arrived to the ED with a complaint of spitting up blood.  Pt began spitting up blood today.  Pt also has low oxygen saturation rates.  Pt was seen for a fungal infection of the lungs and has been taking medication for same.  As well he was told he had a pneumonia and has been taking medication for this.

## 2014-03-04 NOTE — Telephone Encounter (Signed)
Patient called, wanting to speak directly with Dr. Megan Salon.  RN notified patient that he was not available this afternoon, patient stated he didn't want to speak with anyone else.  RN again stated he was not available this afternoon, but that I could pass along a message.  Patient declined, stating "you don't know what I need."  RN advised patient to call Monday. Landis Gandy, RN

## 2014-03-05 ENCOUNTER — Encounter (HOSPITAL_COMMUNITY): Payer: Self-pay | Admitting: *Deleted

## 2014-03-05 DIAGNOSIS — J9600 Acute respiratory failure, unspecified whether with hypoxia or hypercapnia: Secondary | ICD-10-CM | POA: Diagnosis not present

## 2014-03-05 DIAGNOSIS — C3410 Malignant neoplasm of upper lobe, unspecified bronchus or lung: Secondary | ICD-10-CM | POA: Diagnosis not present

## 2014-03-05 DIAGNOSIS — I129 Hypertensive chronic kidney disease with stage 1 through stage 4 chronic kidney disease, or unspecified chronic kidney disease: Secondary | ICD-10-CM | POA: Diagnosis present

## 2014-03-05 DIAGNOSIS — R0902 Hypoxemia: Secondary | ICD-10-CM | POA: Diagnosis not present

## 2014-03-05 DIAGNOSIS — R739 Hyperglycemia, unspecified: Secondary | ICD-10-CM | POA: Diagnosis present

## 2014-03-05 DIAGNOSIS — N170 Acute kidney failure with tubular necrosis: Secondary | ICD-10-CM | POA: Diagnosis not present

## 2014-03-05 DIAGNOSIS — M7989 Other specified soft tissue disorders: Secondary | ICD-10-CM | POA: Diagnosis not present

## 2014-03-05 DIAGNOSIS — Z8619 Personal history of other infectious and parasitic diseases: Secondary | ICD-10-CM | POA: Diagnosis not present

## 2014-03-05 DIAGNOSIS — I517 Cardiomegaly: Secondary | ICD-10-CM | POA: Diagnosis not present

## 2014-03-05 DIAGNOSIS — Z452 Encounter for adjustment and management of vascular access device: Secondary | ICD-10-CM | POA: Diagnosis not present

## 2014-03-05 DIAGNOSIS — I739 Peripheral vascular disease, unspecified: Secondary | ICD-10-CM | POA: Diagnosis present

## 2014-03-05 DIAGNOSIS — C3412 Malignant neoplasm of upper lobe, left bronchus or lung: Secondary | ICD-10-CM | POA: Diagnosis not present

## 2014-03-05 DIAGNOSIS — R578 Other shock: Secondary | ICD-10-CM | POA: Diagnosis not present

## 2014-03-05 DIAGNOSIS — T380X5A Adverse effect of glucocorticoids and synthetic analogues, initial encounter: Secondary | ICD-10-CM | POA: Diagnosis present

## 2014-03-05 DIAGNOSIS — J810 Acute pulmonary edema: Secondary | ICD-10-CM | POA: Diagnosis not present

## 2014-03-05 DIAGNOSIS — Y95 Nosocomial condition: Secondary | ICD-10-CM | POA: Diagnosis present

## 2014-03-05 DIAGNOSIS — R918 Other nonspecific abnormal finding of lung field: Secondary | ICD-10-CM | POA: Diagnosis not present

## 2014-03-05 DIAGNOSIS — D62 Acute posthemorrhagic anemia: Secondary | ICD-10-CM | POA: Diagnosis not present

## 2014-03-05 DIAGNOSIS — J449 Chronic obstructive pulmonary disease, unspecified: Secondary | ICD-10-CM | POA: Diagnosis not present

## 2014-03-05 DIAGNOSIS — Z86010 Personal history of colonic polyps: Secondary | ICD-10-CM | POA: Diagnosis not present

## 2014-03-05 DIAGNOSIS — Z9114 Patient's other noncompliance with medication regimen: Secondary | ICD-10-CM | POA: Diagnosis present

## 2014-03-05 DIAGNOSIS — I4891 Unspecified atrial fibrillation: Secondary | ICD-10-CM | POA: Diagnosis not present

## 2014-03-05 DIAGNOSIS — Z85118 Personal history of other malignant neoplasm of bronchus and lung: Secondary | ICD-10-CM

## 2014-03-05 DIAGNOSIS — I472 Ventricular tachycardia: Secondary | ICD-10-CM | POA: Diagnosis not present

## 2014-03-05 DIAGNOSIS — Z6823 Body mass index (BMI) 23.0-23.9, adult: Secondary | ICD-10-CM | POA: Diagnosis not present

## 2014-03-05 DIAGNOSIS — Z515 Encounter for palliative care: Secondary | ICD-10-CM | POA: Diagnosis not present

## 2014-03-05 DIAGNOSIS — J90 Pleural effusion, not elsewhere classified: Secondary | ICD-10-CM | POA: Diagnosis not present

## 2014-03-05 DIAGNOSIS — R042 Hemoptysis: Secondary | ICD-10-CM | POA: Diagnosis not present

## 2014-03-05 DIAGNOSIS — Z79899 Other long term (current) drug therapy: Secondary | ICD-10-CM | POA: Diagnosis not present

## 2014-03-05 DIAGNOSIS — Z809 Family history of malignant neoplasm, unspecified: Secondary | ICD-10-CM | POA: Diagnosis not present

## 2014-03-05 DIAGNOSIS — Z4682 Encounter for fitting and adjustment of non-vascular catheter: Secondary | ICD-10-CM | POA: Diagnosis not present

## 2014-03-05 DIAGNOSIS — Z902 Acquired absence of lung [part of]: Secondary | ICD-10-CM | POA: Diagnosis present

## 2014-03-05 DIAGNOSIS — N183 Chronic kidney disease, stage 3 (moderate): Secondary | ICD-10-CM | POA: Diagnosis not present

## 2014-03-05 DIAGNOSIS — Z86718 Personal history of other venous thrombosis and embolism: Secondary | ICD-10-CM | POA: Diagnosis not present

## 2014-03-05 DIAGNOSIS — Z7189 Other specified counseling: Secondary | ICD-10-CM | POA: Diagnosis not present

## 2014-03-05 DIAGNOSIS — J984 Other disorders of lung: Secondary | ICD-10-CM | POA: Diagnosis not present

## 2014-03-05 DIAGNOSIS — Z8546 Personal history of malignant neoplasm of prostate: Secondary | ICD-10-CM | POA: Diagnosis not present

## 2014-03-05 DIAGNOSIS — B449 Aspergillosis, unspecified: Secondary | ICD-10-CM

## 2014-03-05 DIAGNOSIS — J9620 Acute and chronic respiratory failure, unspecified whether with hypoxia or hypercapnia: Secondary | ICD-10-CM | POA: Diagnosis not present

## 2014-03-05 DIAGNOSIS — B440 Invasive pulmonary aspergillosis: Secondary | ICD-10-CM | POA: Diagnosis not present

## 2014-03-05 DIAGNOSIS — C781 Secondary malignant neoplasm of mediastinum: Secondary | ICD-10-CM | POA: Diagnosis present

## 2014-03-05 DIAGNOSIS — F17210 Nicotine dependence, cigarettes, uncomplicated: Secondary | ICD-10-CM | POA: Diagnosis present

## 2014-03-05 DIAGNOSIS — J690 Pneumonitis due to inhalation of food and vomit: Secondary | ICD-10-CM | POA: Diagnosis present

## 2014-03-05 DIAGNOSIS — J9690 Respiratory failure, unspecified, unspecified whether with hypoxia or hypercapnia: Secondary | ICD-10-CM | POA: Diagnosis not present

## 2014-03-05 DIAGNOSIS — E43 Unspecified severe protein-calorie malnutrition: Secondary | ICD-10-CM | POA: Diagnosis present

## 2014-03-05 DIAGNOSIS — K219 Gastro-esophageal reflux disease without esophagitis: Secondary | ICD-10-CM | POA: Diagnosis present

## 2014-03-05 DIAGNOSIS — T17808A Unspecified foreign body in other parts of respiratory tract causing other injury, initial encounter: Secondary | ICD-10-CM | POA: Diagnosis present

## 2014-03-05 DIAGNOSIS — J189 Pneumonia, unspecified organism: Secondary | ICD-10-CM | POA: Diagnosis not present

## 2014-03-05 DIAGNOSIS — Z811 Family history of alcohol abuse and dependence: Secondary | ICD-10-CM | POA: Diagnosis not present

## 2014-03-05 DIAGNOSIS — D630 Anemia in neoplastic disease: Secondary | ICD-10-CM | POA: Diagnosis present

## 2014-03-05 DIAGNOSIS — I471 Supraventricular tachycardia: Secondary | ICD-10-CM | POA: Diagnosis present

## 2014-03-05 DIAGNOSIS — Z66 Do not resuscitate: Secondary | ICD-10-CM | POA: Diagnosis present

## 2014-03-05 DIAGNOSIS — M4322 Fusion of spine, cervical region: Secondary | ICD-10-CM | POA: Diagnosis present

## 2014-03-05 DIAGNOSIS — J9601 Acute respiratory failure with hypoxia: Secondary | ICD-10-CM | POA: Diagnosis not present

## 2014-03-05 DIAGNOSIS — J9621 Acute and chronic respiratory failure with hypoxia: Secondary | ICD-10-CM | POA: Diagnosis not present

## 2014-03-05 LAB — CBC WITH DIFFERENTIAL/PLATELET
Basophils Absolute: 0 10*3/uL (ref 0.0–0.1)
Basophils Relative: 0 % (ref 0–1)
Eosinophils Absolute: 1.4 10*3/uL — ABNORMAL HIGH (ref 0.0–0.7)
Eosinophils Relative: 5 % (ref 0–5)
HEMATOCRIT: 30.5 % — AB (ref 39.0–52.0)
Hemoglobin: 10.4 g/dL — ABNORMAL LOW (ref 13.0–17.0)
LYMPHS ABS: 2.8 10*3/uL (ref 0.7–4.0)
Lymphocytes Relative: 10 % — ABNORMAL LOW (ref 12–46)
MCH: 27.9 pg (ref 26.0–34.0)
MCHC: 34.1 g/dL (ref 30.0–36.0)
MCV: 81.8 fL (ref 78.0–100.0)
MONOS PCT: 7 % (ref 3–12)
Monocytes Absolute: 1.9 10*3/uL — ABNORMAL HIGH (ref 0.1–1.0)
NEUTROS ABS: 21.7 10*3/uL — AB (ref 1.7–7.7)
Neutrophils Relative %: 78 % — ABNORMAL HIGH (ref 43–77)
PLATELETS: 502 10*3/uL — AB (ref 150–400)
RBC: 3.73 MIL/uL — AB (ref 4.22–5.81)
RDW: 15.6 % — AB (ref 11.5–15.5)
WBC: 27.8 10*3/uL — AB (ref 4.0–10.5)

## 2014-03-05 LAB — BASIC METABOLIC PANEL
Anion gap: 12 (ref 5–15)
BUN: 24 mg/dL — ABNORMAL HIGH (ref 6–23)
CALCIUM: 8.9 mg/dL (ref 8.4–10.5)
CHLORIDE: 98 mmol/L (ref 96–112)
CO2: 30 mmol/L (ref 19–32)
CREATININE: 1.59 mg/dL — AB (ref 0.50–1.35)
GFR calc non Af Amer: 44 mL/min — ABNORMAL LOW (ref >90)
GFR, EST AFRICAN AMERICAN: 51 mL/min — AB (ref >90)
Glucose, Bld: 93 mg/dL (ref 70–99)
Potassium: 3.9 mmol/L (ref 3.5–5.1)
Sodium: 140 mmol/L (ref 135–145)

## 2014-03-05 LAB — EXPECTORATED SPUTUM ASSESSMENT W GRAM STAIN, RFLX TO RESP C

## 2014-03-05 LAB — APTT: aPTT: 54 seconds — ABNORMAL HIGH (ref 24–37)

## 2014-03-05 LAB — EXPECTORATED SPUTUM ASSESSMENT W REFEX TO RESP CULTURE

## 2014-03-05 LAB — SAMPLE TO BLOOD BANK

## 2014-03-05 LAB — I-STAT TROPONIN, ED: Troponin i, poc: 0 ng/mL (ref 0.00–0.08)

## 2014-03-05 LAB — PROTIME-INR
INR: 1.17 (ref 0.00–1.49)
Prothrombin Time: 15 seconds (ref 11.6–15.2)

## 2014-03-05 LAB — RAPID HIV SCREEN (WH-MAU): Rapid HIV Screen: NONREACTIVE

## 2014-03-05 MED ORDER — ALLOPURINOL 100 MG PO TABS
100.0000 mg | ORAL_TABLET | Freq: Every day | ORAL | Status: DC | PRN
  Administered 2014-03-09: 100 mg via ORAL
  Filled 2014-03-05 (×2): qty 1

## 2014-03-05 MED ORDER — FENTANYL CITRATE 0.05 MG/ML IJ SOLN
100.0000 ug | Freq: Once | INTRAMUSCULAR | Status: AC
  Administered 2014-03-05: 100 ug via INTRAVENOUS
  Filled 2014-03-05: qty 2

## 2014-03-05 MED ORDER — ALBUTEROL SULFATE (2.5 MG/3ML) 0.083% IN NEBU
2.5000 mg | INHALATION_SOLUTION | RESPIRATORY_TRACT | Status: DC | PRN
  Administered 2014-03-09 – 2014-03-11 (×5): 2.5 mg via RESPIRATORY_TRACT
  Filled 2014-03-05 (×5): qty 3

## 2014-03-05 MED ORDER — ALPRAZOLAM 0.5 MG PO TABS
0.5000 mg | ORAL_TABLET | Freq: Three times a day (TID) | ORAL | Status: DC | PRN
  Administered 2014-03-06 – 2014-03-08 (×4): 0.5 mg via ORAL
  Filled 2014-03-05 (×5): qty 1

## 2014-03-05 MED ORDER — ALBUTEROL SULFATE HFA 108 (90 BASE) MCG/ACT IN AERS: 2.0000 | INHALATION_SPRAY | RESPIRATORY_TRACT | Status: DC | PRN

## 2014-03-05 MED ORDER — VALACYCLOVIR HCL 500 MG PO TABS
1000.0000 mg | ORAL_TABLET | Freq: Every day | ORAL | Status: DC
  Administered 2014-03-05 – 2014-03-09 (×5): 1000 mg via ORAL
  Filled 2014-03-05 (×7): qty 2

## 2014-03-05 MED ORDER — LATANOPROST 0.005 % OP SOLN
1.0000 [drp] | Freq: Every day | OPHTHALMIC | Status: DC
  Administered 2014-03-05 – 2014-03-18 (×14): 1 [drp] via OPHTHALMIC
  Filled 2014-03-05: qty 2.5

## 2014-03-05 MED ORDER — HYDROCHLOROTHIAZIDE 12.5 MG PO CAPS
12.5000 mg | ORAL_CAPSULE | Freq: Every day | ORAL | Status: DC
  Administered 2014-03-05: 12.5 mg via ORAL
  Filled 2014-03-05 (×2): qty 1

## 2014-03-05 MED ORDER — HYDROMORPHONE HCL 1 MG/ML IJ SOLN
1.0000 mg | INTRAMUSCULAR | Status: DC | PRN
  Administered 2014-03-05 – 2014-03-06 (×11): 1 mg via INTRAVENOUS
  Filled 2014-03-05 (×11): qty 1

## 2014-03-05 MED ORDER — FLUTICASONE PROPIONATE 50 MCG/ACT NA SUSP
2.0000 | Freq: Every day | NASAL | Status: DC
  Administered 2014-03-05 – 2014-03-10 (×2): 2 via NASAL
  Filled 2014-03-05 (×3): qty 16

## 2014-03-05 MED ORDER — ENSURE COMPLETE PO LIQD
237.0000 mL | Freq: Three times a day (TID) | ORAL | Status: DC
  Administered 2014-03-05 – 2014-03-09 (×8): 237 mL via ORAL

## 2014-03-05 MED ORDER — VORICONAZOLE 200 MG PO TABS
200.0000 mg | ORAL_TABLET | Freq: Two times a day (BID) | ORAL | Status: DC
  Administered 2014-03-05 – 2014-03-09 (×10): 200 mg via ORAL
  Filled 2014-03-05 (×12): qty 1

## 2014-03-05 MED ORDER — AMLODIPINE BESYLATE 10 MG PO TABS
10.0000 mg | ORAL_TABLET | Freq: Every day | ORAL | Status: DC
  Administered 2014-03-05 – 2014-03-09 (×4): 10 mg via ORAL
  Filled 2014-03-05 (×7): qty 1

## 2014-03-05 MED ORDER — TAMSULOSIN HCL 0.4 MG PO CAPS
0.4000 mg | ORAL_CAPSULE | Freq: Every day | ORAL | Status: DC
  Administered 2014-03-05 – 2014-03-09 (×5): 0.4 mg via ORAL
  Filled 2014-03-05 (×6): qty 1

## 2014-03-05 MED ORDER — SODIUM CHLORIDE 0.9 % IV SOLN
INTRAVENOUS | Status: DC
  Administered 2014-03-05 – 2014-03-07 (×3): via INTRAVENOUS
  Administered 2014-03-08: 75 mL/h via INTRAVENOUS
  Administered 2014-03-09 – 2014-03-11 (×4): via INTRAVENOUS
  Administered 2014-03-12: 1000 mL via INTRAVENOUS

## 2014-03-05 NOTE — Progress Notes (Signed)
PULMONARY / CRITICAL CARE MEDICINE   Name: [name] [name] MRN: [id number] DOB: 15-Jul-1945    ADMISSION DATE:  03/12/2014 CONSULTATION DATE:  03/05/14  REFERRING MD :  Alcario Drought  CHIEF COMPLAINT:  hemoptysis  INITIAL PRESENTATION: hemoptysis, LUL cavitary lesion with mediastinal invasion  STUDIES:  CXR: LUL cavitary lseion and mediastinal invasion  SIGNIFICANT EVENTS:   HISTORY OF PRESENT ILLNESS:   This is a 44M known smoker for a long time, non alcoholic, history of RUL NSCLC in 2008 s/p resection. Apparently in June 2015 started having LUL "pneumonia" which was treated but still slowly progressing, becoming bigger, mass-like, with cavitary component and air fluid level. Has had bronch and TBBx that showed no atypia but did have some hyphae (but no growth), with positive aspergillus Ab thus he was being treated with voriconazole as outpatient for a few months (for the most part compliant although he admits to missing his med once in a while). Despite this the LUL lesion keeps getting bigger. Then yesterday he suddenly started having hemoptysis described as blood clots subjectively totalling for about a cup. Also has chest pain across his upper chest radiating to the back, worsened by direct pressure and lying down. He reported losing about 40 pounds the past year or so. No fever, had chills. No N/V, no abd pain, no rash, no diarrhea, no dysuria, no rash. He was from [place] but has lived in [place] for more than 10 years. No pets, no known exposure, no molds in the house. Not diabetic.    SUBJECTIVE: Current dilaudid dose insufficient for pain control. Note hx itching with narcotics- limited tolerance. Still intermittent hemoptysis- clots.  VITAL SIGNS: Temp:  [98.5 F (36.9 C)-99.2 F (37.3 C)] 99.2 F (37.3 C) (02/06 0532) Pulse Rate:  [67-96] 78 (02/06 0532) Resp:  [9-20] 16 (02/06 0532) BP: (107-150)/(51-82) 111/51 mmHg (02/06 0532) SpO2:  [84 %-100 %] 95 % (02/06 0900) Weight:   [54.568 kg (120 lb 4.8 oz)] 54.568 kg (120 lb 4.8 oz) (02/06 0216) HEMODYNAMICS:   VENTILATOR SETTINGS:   INTAKE / OUTPUT: No intake or output data in the 24 hours ending 03/05/14 1403  PHYSICAL EXAMINATION: General:  Appears thin, cachectic, uncomfortable at rest. Tender L suprascapular and shoulder area. Neuro:  No focal weakness, no facial droop, no slurred speech HEENT:  Atraumatic, no stridor Cardiovascular:  RRR, no loud murmur Lungs:  Inspiratory rales LLL, decreased breath sounds LUL, no wheeze Abdomen:  Soft, nontender, no guarding Musculoskeletal:  No deformities, no leg edema Skin:  No ecchymosis  LABS:  CBC  Recent Labs Lab 03/25/2014 2310  WBC 27.8*  HGB 10.4*  HCT 30.5*  PLT 502*   Coag's  Recent Labs Lab 03/05/14 0628  APTT 54*  INR 1.17   BMET  Recent Labs Lab 03/21/2014 2310  NA 140  K 3.9  CL 98  CO2 30  BUN 24*  CREATININE 1.59*  GLUCOSE 93   Electrolytes  Recent Labs Lab 03/21/2014 2310  CALCIUM 8.9   Sepsis Markers No results for input(s): LATICACIDVEN, PROCALCITON, O2SATVEN in the last 168 hours. ABG No results for input(s): PHART, PCO2ART, PO2ART in the last 168 hours. Liver Enzymes No results for input(s): AST, ALT, ALKPHOS, BILITOT, ALBUMIN in the last 168 hours. Cardiac Enzymes No results for input(s): TROPONINI, PROBNP in the last 168 hours. Glucose No results for input(s): GLUCAP in the last 168 hours.  Imaging Dg Chest 2 View  03/05/2014   CLINICAL DATA:  Hemoptysis.  Lung cancer.  EXAM: CHEST  2 VIEW  COMPARISON:  Chest CT 02/25/2014  FINDINGS: The cardiac silhouette, mediastinal and hilar contours are Stable. Large mass occupying the left lung apex and mediastinum with cavitation. Underlying advanced emphysematous changes and right upper lobe pulmonary scarring. No obvious acute overlying pulmonary process. No pleural effusion.  IMPRESSION: Stable large left apical lung mass with cavitation and mediastinal invasion.  No  definite acute overlying pulmonary process.   Electronically Signed   By: Kalman Jewels M.D.   On: 03/14/2014 23:12   CXR and latest CT reviewed 2/6  ASSESSMENT / PLAN:  PULMONARY OETT A: hemoptysis likely related to invasion by the LUL cavitary lesion, difficult to distinguish between malignancy vs fungal infection/abscess. Leaning slightly more towards malignancy but abscess/invasive fungal infection is a close second. Would not be surprised if he has both superimposed on each other. P:   He has CT bx scheduled for 2/8. If bleeding slows for visibility, may be able to bronch sooner. Need fungal cultures and cytopath regardless. Quantify hemoptysis Check PT/aPTT Avoid all blood thinner Clear liquids for now in case he needs intubation of procedure May continue voriconazole for now. Follow up sputum culture. 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 Possible superimposed abscess P:   BCx2  UC  Sputum pending Abx: voriconazole  ENDOCRINE A:  No acute issue P:   monitor  NEUROLOGIC A:  No acute issue P:   RASS goal:     FAMILY  - Updates:   - Inter-disciplinary family meet or Palliative Care meeting due by:      TODAY'S SUMMARY: 48M with new hemoptysis, weight loss, LUL mass/cavitary lesion invading mediastinum and spine and possibly abscess in the setting of possible aspergillus in the past, smoker. Concerning for either worsening invasive aspergillus vs malignancy. Likely needs repeat  bronchoscopy. Maybe even surgical intervention. HAs history or NSCLC s/p resection in 2008 He was scheduled for CT bx 2/8. If bleeding slows, he might be candidate for bronch sooner. Can check Monday for earlier needle bx. Needs more pain control.     CD [name] Groleau, MD Pulmonary [facility] [phone] Pager: [phone]  03/05/2014, 2:03 PM

## 2014-03-05 NOTE — Progress Notes (Signed)
[facility] PROGRESS NOTE  [name] [name] OEU:[id number] DOB: [birth year] DOA: 03/21/2014 PCP: Garnet Koyanagi, DO  Assessment/Plan: 1. Left upper lobe cavitary lesion -Patient found to have changes to left upper lobe from a chest x-ray in July 2015, which has progressed to cavitary process. -In the month of September he was treated with IV vancomycin, Zosyn and voriconazole -At this point unclear if this represents malignancy or invasive fungal infection or perhaps a combination of the two -Pulmonary medicine has been consulted, recommending repeat bronchoscopy as repeat chest x-ray showed large left apical lung mass with cavitation and mediastinal invasion -Will continue voriconazole 200 mg by mouth twice a day -Await further recommendations from pulmonary medicine  2.  Hemoptysis -Likely secondary to left upper lobe cavitary lesion -Repeat chest x-ray performed overnight showing stable left large apical lung mass -Hemoglobin remained stable at 10.4 -Continue to monitor H&H -Will likely undergo repeat bronchoscopy for further evaluation  3.  Leukocytosis -Initial labs showing elevated white count of 27,800, likely due to underlying infectious process and perhaps malignancy  4.  Chronic obstructive pulmonary disease -Stable   Code Status: Full code Family Communication:  Disposition Plan: Pulmonary medicine consulted, recommending repeat bronchoscopy   Consultants:  Pulmonary critical care medicine   Antibiotics:  Voriconazole  Valtrex  HPI/Subjective: Patient is a 69 year old gentleman with a past medical history of tobacco abuse, non-small cell lung cancer status post resection without adjuvant therapy, having a chest x-ray on 07/08/2013 which revealed extensive primary interstitial pneumonitis in the posterior segment of the left upper lobe. On 07/29/2013 repeat chest x-ray showed progression of consolidation in the left upper lobe with suspicious areas of cavitation.  A sputum culture from 08/06/2013 growing Mycobacterium. AFB cultures were negative. Patient undergoing a bronchoscopy in August 2015 with cultures negative however pathology revealed fungal hyphae. Aspergillus serology positive. He was admitted to the medicine service on 10/03/2013 at which time he was started on voriconazole. CT scan of lungs performed on 10/04/2013 showed extensive infiltrative/inflammatory process in the left lung with large cavitary process. He was discharged on IV Voriconazole, Zosyn, vancomycin that hospitalization. A repeat CT scan of chest performed on 02/25/2014 showed increased size and bulky peripheral soft tissue component of masslike opacity in the left upper lobe with involvement of the left ilium and mediastinum. Also noted was new bone destruction of T5 vertebral body consistent with osseous involvement. Patient admitted to the medicine service on 03/05/2014 presenting with complaints of hemoptysis. Patient was evaluated by pulmonary/critical care medicine recommending continuing current antimicrobial therapy along with bronchoscopy.  Objective: Filed Vitals:   03/05/14 0532  BP: 111/51  Pulse: 78  Temp: 99.2 F (37.3 C)  Resp: 16   No intake or output data in the 24 hours ending 03/05/14 0911 Filed Weights   03/05/14 0216  Weight: 54.568 kg (120 lb 4.8 oz)    Exam:   General:  Ill-appearing, cachectic, no acute distress  Cardiovascular: Regular rate and rhythm normal S1-S2  Respiratory: Diminished breath sounds bilaterally, decreased breath sounds bilaterally, scattered end expiratory wheezes, on supplemental oxygen  Abdomen: Soft nontender nondistended  Musculoskeletal: Bilateral muscle atrophy  Data Reviewed: Basic Metabolic Panel:  Recent Labs Lab 03/02/2014 2310  NA 140  K 3.9  CL 98  CO2 30  GLUCOSE 93  BUN 24*  CREATININE 1.59*  CALCIUM 8.9   Liver Function Tests: No results for input(s): AST, ALT, ALKPHOS, BILITOT, PROT, ALBUMIN in  the last 168 hours. No results for input(s): LIPASE, AMYLASE in  the last 168 hours. No results for input(s): AMMONIA in the last 168 hours. CBC:  Recent Labs Lab 03/20/2014 2310  WBC 27.8*  NEUTROABS 21.7*  HGB 10.4*  HCT 30.5*  MCV 81.8  PLT 502*   Cardiac Enzymes: No results for input(s): CKTOTAL, CKMB, CKMBINDEX, TROPONINI in the last 168 hours. BNP (last 3 results) No results for input(s): BNP in the last 8760 hours.  ProBNP (last 3 results) No results for input(s): PROBNP in the last 8760 hours.  CBG: No results for input(s): GLUCAP in the last 168 hours.  Recent Results (from the past 240 hour(s))  Culture, sputum-assessment     Status: None   Collection Time: 03/05/14  6:52 AM  Result Value Ref Range Status   Specimen Description SPUTUM  Final   Special Requests NONE  Final   Sputum evaluation   Final    THIS SPECIMEN IS ACCEPTABLE. RESPIRATORY CULTURE REPORT TO FOLLOW.   Report Status 03/05/2014 FINAL  Final     Studies: Dg Chest 2 View  03/09/2014   CLINICAL DATA:  Hemoptysis.  Lung cancer.  EXAM: CHEST  2 VIEW  COMPARISON:  Chest CT 02/25/2014  FINDINGS: The cardiac silhouette, mediastinal and hilar contours are Stable. Large mass occupying the left lung apex and mediastinum with cavitation. Underlying advanced emphysematous changes and right upper lobe pulmonary scarring. No obvious acute overlying pulmonary process. No pleural effusion.  IMPRESSION: Stable large left apical lung mass with cavitation and mediastinal invasion.  No definite acute overlying pulmonary process.   Electronically Signed   By: Kalman Jewels M.D.   On: 03/16/2014 23:12    Scheduled Meds: . amLODipine  10 mg Oral Daily  . feeding supplement (ENSURE COMPLETE)  237 mL Oral TID BM  . fluticasone  2 spray Each Nare Daily  . hydrochlorothiazide  12.5 mg Oral Daily  . latanoprost  1 drop Both Eyes QHS  . tamsulosin  0.4 mg Oral QPC supper  . valACYclovir  1,000 mg Oral Daily  .  voriconazole  200 mg Oral Q12H   Continuous Infusions: . sodium chloride 75 mL/hr at 03/05/14 7939    Principal Problem:   Lung mass Active Problems:   History of lung cancer   CKD (chronic kidney disease) stage 3, GFR 30-59 ml/min   Aspergillus pneumonia   Hemoptysis    Time spent:     Kelvin Cellar  [facility] Pager [phone]. If 7PM-7AM, please contact night-coverage at [email or web address], password [facility] 03/05/2014, 9:11 AM  LOS: 1 day
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
## 2014-03-05 NOTE — Progress Notes (Signed)
Pt had home meds in his possession when admitted to floor; informed pt that meds would need to be stored in pharmacy until someone would be able to pick them up. Pt understands policy and was cooperative. Meds were counted and verified with Reynold Bowen, RN, med list reviewed by pt. Meds are currently stored in the Pharmacy vault.

## 2014-03-05 NOTE — Consult Note (Signed)
PULMONARY / CRITICAL CARE MEDICINE   Name: [name] [name] MRN: [id number] DOB: 16-May-1945    ADMISSION DATE:  03/26/2014 CONSULTATION DATE:  03/05/14  REFERRING MD :  Alcario Drought  CHIEF COMPLAINT:  hemoptysis  INITIAL PRESENTATION: hemoptysis, LUL cavitary lesion with mediastinal invasion  STUDIES:  CXR: LUL cavitary lseion and mediastinal invasion  SIGNIFICANT EVENTS:   HISTORY OF PRESENT ILLNESS:   This is a 49M known smoker for a long time, non alcoholic, history of RUL NSCLC in 2008 s/p resection. Apparently in June 2015 started having LUL "pneumonia" which was treated but still slowly progressing, becoming bigger, mass-like, with cavitary component and air fluid level. Has had bronch and TBBx that showed no atypia but did have some hyphae (but no growth), with positive aspergillus Ab thus he was being treated with voriconazole as outpatient for a few months (for the most part compliant although he admits to missing his med once in a while). Despite this the LUL lesion keeps getting bigger. Then yesterday he suddenly started having hemoptysis described as blood clots subjectively totalling for about a cup. Also has chest pain across his upper chest radiating to the back, worsened by direct pressure and lying down. He reported losing about 40 pounds the past year or so. No fever, had chills. No N/V, no abd pain, no rash, no diarrhea, no dysuria, no rash. He was from [place] but has lived in [place] for more than 10 years. No pets, no known exposure, no molds in the house. Not diabetic.   PAST MEDICAL HISTORY :   has a past medical history of Peripheral vascular disease; Pancreatitis; Hypertension; Glaucoma; COPD (chronic obstructive pulmonary disease); Ulcer; H/O alcohol abuse; History of tobacco abuse; PONV (postoperative nausea and vomiting); Anemia; GERD (gastroesophageal reflux disease); Carpal tunnel syndrome, bilateral; Colon polyps (12/2011); Prostate carcinoma; Lung cancer, upper lobe  (2007); Aspergillus pneumonia; and Necrotizing pneumonia.  has past surgical history that includes bilateral common iliac and external iliac angioplasty and stenting; Epigastric hernia repair (12/20/2001); Anterior cervical decomp/discectomy fusion; Chalazion excision (03/13/2011); Eye surgery (2013); Lung surgery; Ankle arthroplasty; Vascular surgery; Foot surgery; Cardiac catheterization; Femoral-popliteal Bypass Graft (Left, 09/09/2012); Endarterectomy femoral (Left, 09/09/2012); Patch angioplasty (Left, 09/09/2012); and Video bronchoscopy (Bilateral, 09/16/2013). Prior to Admission medications   Medication Sig Start Date End Date Taking? Authorizing Provider  ALPRAZolam (XANAX) 0.5 MG tablet Take 1 tab 30 min prior to procedure and tid prn Patient taking differently: Take 0.5 mg by mouth 3 (three) times daily as needed for anxiety.  02/14/14  Yes Yvonne R Lowne, DO  amLODipine (NORVASC) 10 MG tablet Take 1 tablet (10 mg total) by mouth daily. 01/04/14  Yes Rosalita Chessman, DO  feeding supplement, ENSURE COMPLETE, (ENSURE COMPLETE) LIQD Take 237 mLs by mouth 3 (three) times daily between meals. 10/12/13  Yes Nishant Dhungel, MD  fluticasone (FLONASE) 50 MCG/ACT nasal spray Place 2 sprays into both nostrils daily. 07/07/13  Yes Yvonne R Lowne, DO  hydrochlorothiazide (MICROZIDE) 12.5 MG capsule Take 1 capsule (12.5 mg total) by mouth daily. 01/04/14  Yes Rosalita Chessman, DO  HYDROcodone-acetaminophen (NORCO) 7.5-325 MG per tablet Take 1 tablet by mouth every 6 (six) hours as needed for moderate pain. 02/25/14  Yes Yvonne R Lowne, DO  latanoprost (XALATAN) 0.005 % ophthalmic solution Place 1 drop into both eyes at bedtime.  03/03/12  Yes Historical Provider, MD  PROAIR HFA 108 (90 BASE) MCG/ACT inhaler Inhale 2 puffs into the lungs every 4 (four) hours as needed for wheezing  or shortness of breath.  06/11/10  Yes Historical Provider, MD  tamsulosin (FLOMAX) 0.4 MG CAPS capsule Take 1 capsule (0.4 mg total) by mouth  daily after supper. 01/04/14  Yes Yvonne R Lowne, DO  valACYclovir (VALTREX) 1000 MG tablet Take 1 tablet (1,000 mg total) by mouth daily. 01/04/14  Yes Yvonne R Lowne, DO  voriconazole (VFEND) 200 MG tablet Take 1 tablet (200 mg total) by mouth every 12 (twelve) hours. 11/15/13  Yes Michel Bickers, MD  allopurinol (ZYLOPRIM) 100 MG tablet Take 1 tablet (100 mg total) by mouth daily as needed (gout pain). 01/04/14   Rosalita Chessman, DO   Allergies  Allergen Reactions  . Morphine And Related Itching    Head to toe  . Chantix [Varenicline] Other (See Comments)    Dreams, itching, suicidal ideation  . Hydromorphone Hcl Itching    Tolerates Hydrocodone-Acetaminophen  . Ibuprofen Nausea And Vomiting  . Omnipaque [Iohexol]      Code: HIVES, Desc: FACIAL HIVES S/P IV CONTRAST.Marland KitchenMarland KitchenOK W/ 25MG  BENADRYL//A.C., Onset Date: [id number]   . Shellfish Allergy Swelling    FAMILY HISTORY:  indicated that his mother is deceased. He indicated that his father is deceased. He indicated that only one of his three sisters is alive. He indicated that both of his brothers are deceased. He indicated that his daughter is deceased.  SOCIAL HISTORY:  reports that he has been smoking.  He has never used smokeless tobacco. He reports that he uses illicit drugs (Marijuana). He reports that he does not drink alcohol.  REVIEW OF SYSTEMS:  No focal weakness, no vision changes, no hoarseness, no night sweat, no speech changes, no dizziness  SUBJECTIVE:   VITAL SIGNS: Temp:  [98.5 F (36.9 C)-98.7 F (37.1 C)] 98.7 F (37.1 C) (02/06 0216) Pulse Rate:  [67-96] 75 (02/06 0216) Resp:  [9-20] 16 (02/06 0216) BP: (107-150)/(67-82) 132/71 mmHg (02/06 0216) SpO2:  [84 %-100 %] 94 % (02/06 0216) Weight:  [54.568 kg (120 lb 4.8 oz)] 54.568 kg (120 lb 4.8 oz) (02/06 0216) HEMODYNAMICS:   VENTILATOR SETTINGS:   INTAKE / OUTPUT: No intake or output data in the 24 hours ending 03/05/14 0424  PHYSICAL EXAMINATION: General:   Appears thin, cachectic, comfortable at rest Neuro:  No focal weakness, no facial droop, no slurred speech HEENT:  Atraumatic, no stridor Cardiovascular:  RRR, no loud murmur Lungs:  Inspiratory rales LLL, decreased breath sounds LUL, no wheeze Abdomen:  Soft, nontender, no guarding Musculoskeletal:  No deformities, no leg edema Skin:  No ecchymosis  LABS:  CBC  Recent Labs Lab 03/27/2014 2310  WBC 27.8*  HGB 10.4*  HCT 30.5*  PLT 502*   Coag's No results for input(s): APTT, INR in the last 168 hours. BMET  Recent Labs Lab 03/11/2014 2310  NA 140  K 3.9  CL 98  CO2 30  BUN 24*  CREATININE 1.59*  GLUCOSE 93   Electrolytes  Recent Labs Lab 03/06/2014 2310  CALCIUM 8.9   Sepsis Markers No results for input(s): LATICACIDVEN, PROCALCITON, O2SATVEN in the last 168 hours. ABG No results for input(s): PHART, PCO2ART, PO2ART in the last 168 hours. Liver Enzymes No results for input(s): AST, ALT, ALKPHOS, BILITOT, ALBUMIN in the last 168 hours. Cardiac Enzymes No results for input(s): TROPONINI, PROBNP in the last 168 hours. Glucose No results for input(s): GLUCAP in the last 168 hours.  Imaging Dg Chest 2 View  03/18/2014   CLINICAL DATA:  Hemoptysis.  Lung cancer.  EXAM: CHEST  2 VIEW  COMPARISON:  Chest CT 02/25/2014  FINDINGS: The cardiac silhouette, mediastinal and hilar contours are Stable. Large mass occupying the left lung apex and mediastinum with cavitation. Underlying advanced emphysematous changes and right upper lobe pulmonary scarring. No obvious acute overlying pulmonary process. No pleural effusion.  IMPRESSION: Stable large left apical lung mass with cavitation and mediastinal invasion.  No definite acute overlying pulmonary process.   Electronically Signed   By: Kalman Jewels M.D.   On: 03/15/2014 23:12     ASSESSMENT / PLAN:  PULMONARY OETT A: hemoptysis likely related to invasion by the LUL cavitary lesion, difficult to distinguish between  malignancy vs fungal infection/abscess. Leaning slightly more towards malignancy but abscess/invasive fungal infection is a close second. Would not be surprised if he has both superimposed on each other. P:   Needs repeat bronch, and maybe even surgical biopsy/intervention especially considering the size and complexity of the lesion Quantify hemoptysis Check PT/aPTT Avoid all blood thinner Clear liquids for now in case he needs intubation of procedure May continue voriconazole for now. Follow up sputum culture. 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 Possible superimposed abscess P:   BCx2  UC  Sputum pending Abx: voriconazole  ENDOCRINE A:  No acute issue P:   monitor  NEUROLOGIC A:  No acute issue P:   RASS goal:     FAMILY  - Updates:   - Inter-disciplinary family meet or Palliative Care meeting due by:      TODAY'S SUMMARY: 37M with new hemoptysis, weight loss, LUL mass/cavitaroy lesion and possibly abscess in the setting of possible aspergillus in the past, smoker. Concerning for either worsening invasive aspergillus vs malignancy. Likely needs repeat bronchoscopy. Maybe even surgical intervention. HAs history or NSCLC s/p resection in 2008    Critical care time: 35 minutes  Pulmonary and [place] Pager: [phone]  03/05/2014, 4:24 AM

## 2014-03-06 DIAGNOSIS — C3412 Malignant neoplasm of upper lobe, left bronchus or lung: Principal | ICD-10-CM

## 2014-03-06 DIAGNOSIS — D62 Acute posthemorrhagic anemia: Secondary | ICD-10-CM

## 2014-03-06 LAB — BASIC METABOLIC PANEL
Anion gap: 8 (ref 5–15)
BUN: 17 mg/dL (ref 6–23)
CALCIUM: 8.2 mg/dL — AB (ref 8.4–10.5)
CHLORIDE: 104 mmol/L (ref 96–112)
CO2: 26 mmol/L (ref 19–32)
CREATININE: 1.22 mg/dL (ref 0.50–1.35)
GFR, EST AFRICAN AMERICAN: 70 mL/min — AB (ref >90)
GFR, EST NON AFRICAN AMERICAN: 60 mL/min — AB (ref >90)
Glucose, Bld: 79 mg/dL (ref 70–99)
POTASSIUM: 3.9 mmol/L (ref 3.5–5.1)
Sodium: 138 mmol/L (ref 135–145)

## 2014-03-06 LAB — CBC
HCT: 25.6 % — ABNORMAL LOW (ref 39.0–52.0)
HEMOGLOBIN: 8.3 g/dL — AB (ref 13.0–17.0)
MCH: 26.5 pg (ref 26.0–34.0)
MCHC: 32.4 g/dL (ref 30.0–36.0)
MCV: 81.8 fL (ref 78.0–100.0)
PLATELETS: 439 10*3/uL — AB (ref 150–400)
RBC: 3.13 MIL/uL — ABNORMAL LOW (ref 4.22–5.81)
RDW: 15.5 % (ref 11.5–15.5)
WBC: 25.7 10*3/uL — ABNORMAL HIGH (ref 4.0–10.5)

## 2014-03-06 LAB — PREPARE RBC (CROSSMATCH)

## 2014-03-06 MED ORDER — OXYCODONE HCL 5 MG PO TABS
10.0000 mg | ORAL_TABLET | Freq: Four times a day (QID) | ORAL | Status: DC | PRN
Start: 2014-03-06 — End: 2014-03-09
  Administered 2014-03-06 – 2014-03-08 (×4): 10 mg via ORAL
  Filled 2014-03-06 (×6): qty 2

## 2014-03-06 MED ORDER — SODIUM CHLORIDE 0.9 % IV SOLN
Freq: Once | INTRAVENOUS | Status: AC
Start: 2014-03-06 — End: 2014-03-06
  Administered 2014-03-06: 09:00:00 via INTRAVENOUS

## 2014-03-06 MED ORDER — HYDROMORPHONE HCL 2 MG/ML IJ SOLN
2.0000 mg | INTRAMUSCULAR | Status: DC | PRN
  Administered 2014-03-06 – 2014-03-10 (×29): 2 mg via INTRAVENOUS
  Filled 2014-03-06 (×30): qty 1

## 2014-03-06 NOTE — Progress Notes (Signed)
Pt wants to see which MD is doing his bronchoscopy tomorrow before signing his consent.

## 2014-03-06 NOTE — Progress Notes (Addendum)
[facility] PROGRESS NOTE  [name] [name] ATF:[id number] DOB: [birth year] DOA: 03/27/2014 PCP: Garnet Koyanagi, DO  Interim Summary Patient is a 69 year old gentleman with a past medical history of tobacco abuse, non-small cell lung cancer status post resection without adjuvant therapy, having a chest x-ray on 07/08/2013 which revealed extensive primary interstitial pneumonitis in the posterior segment of the left upper lobe. On 07/29/2013 repeat chest x-ray showed progression of consolidation in the left upper lobe with suspicious areas of cavitation. A sputum culture from 08/06/2013 growing Mycobacterium. AFB cultures were negative. Patient undergoing a bronchoscopy in August 2015 with cultures negative however pathology revealed fungal hyphae. Aspergillus serology positive. He was admitted to the medicine service on 10/03/2013 at which time he was started on voriconazole. CT scan of lungs performed on 10/04/2013 showed extensive infiltrative/inflammatory process in the left lung with large cavitary process. He was discharged on IV Voriconazole, Zosyn, vancomycin with home health services. A repeat CT scan of chest performed on 02/25/2014 showed increased size and bulky peripheral soft tissue component of masslike opacity in the left upper lobe with involvement of the left ilium and mediastinum. Also noted was new bone destruction of T5 vertebral body consistent with osseous involvement. Patient admitted to the medicine service on 03/05/2014 presenting with complaints of hemoptysis. Patient was evaluated by pulmonary/critical care medicine during this hospitalization recommending continuing current antimicrobial therapy and having him undergo bronchoscopy which is planned for the next 24-48 hours.   Assessment/Plan: 1. Left upper lobe cavitary lesion; infection vs malignancy -Patient found to have changes to left upper lobe from a chest x-ray in July 2015, which has progressed to cavitary process. -In  the month of September he was treated with IV vancomycin, Zosyn and voriconazole -At this point unclear if this represents malignancy or invasive fungal infection or perhaps a combination of the two -Patient being a smoker with history of lung cancer.  -Pulmonary medicine has been consulted, recommending repeat bronchoscopy as repeat chest x-ray showed large left apical lung mass with cavitation and mediastinal invasion -Will continue voriconazole 200 mg by mouth twice a day -Plan for bronchoscopy in the next 24-48 hours  2.  Hemoptysis  -Likely secondary to left upper lobe cavitary lesion -Repeat chest x-ray performed overnight showing stable left large apical lung mass -Hg dropping from 10.4 to 8.3  -Will transfuse 1 unit of blood.  3. Acute blood loss anemia -Patient presenting with hemoptysis and does not appear to have any other source of bleed -Continues to have hemoptysis although seems to be slowing down since yesterday -Will type and cross and transfuse 1 unit of PRBC's today.  -Will continue to follow his H&H -Pulmonary following  4. Pain Management -Patient complaining of pleuritic chest pain, having severe left sided thoracic pain with palpation -Will treat with oxycodone 10 mg PO q 6 hours PRN and increase his dilaudid to 2 mg IV 1 2 hours for severe breakthrough pain  5.  Leukocytosis -Initial labs showing elevated white count of 27,800, likely due to underlying infectious process and perhaps malignancy  6.  Chronic obstructive pulmonary disease -Stable   Code Status: Full code Family Communication:  Disposition Plan: Pulmonary medicine consulted, recommending repeat bronchoscopy   Consultants:  Pulmonary critical care medicine   Antibiotics:  Voriconazole  Valtrex  HPI/Subjective: He reports ongoing hemoptysis along with severe pain involving left hemithorax. He also requested that his diet be advanced.  Objective: Filed Vitals:   03/06/14 0444  BP:  113/52  Pulse: 90  Temp: 99.5 F (37.5 C)  Resp: 20    Intake/Output Summary (Last 24 hours) at 03/06/14 0819 Last data filed at 03/06/14 0700  Gross per 24 hour  Intake   2925 ml  Output    775 ml  Net   2150 ml   Filed Weights   03/05/14 0216  Weight: 54.568 kg (120 lb 4.8 oz)    Exam:   General:  Ill-appearing, cachectic, no acute distress  Cardiovascular: Regular rate and rhythm normal S1-S2  Respiratory: Diminished breath sounds bilaterally, decreased breath sounds bilaterally, scattered end expiratory wheezes, has a few rhonchi, on supplemental oxygen  Abdomen: Soft nontender nondistended  Musculoskeletal: Bilateral muscle atrophy  Data Reviewed: Basic Metabolic Panel:  Recent Labs Lab 03/15/2014 2310 03/06/14 0535  NA 140 138  K 3.9 3.9  CL 98 104  CO2 30 26  GLUCOSE 93 79  BUN 24* 17  CREATININE 1.59* 1.22  CALCIUM 8.9 8.2*   Liver Function Tests: No results for input(s): AST, ALT, ALKPHOS, BILITOT, PROT, ALBUMIN in the last 168 hours. No results for input(s): LIPASE, AMYLASE in the last 168 hours. No results for input(s): AMMONIA in the last 168 hours. CBC:  Recent Labs Lab 03/09/2014 2310 03/06/14 0535  WBC 27.8* 25.7*  NEUTROABS 21.7*  --   HGB 10.4* 8.3*  HCT 30.5* 25.6*  MCV 81.8 81.8  PLT 502* 439*   Cardiac Enzymes: No results for input(s): CKTOTAL, CKMB, CKMBINDEX, TROPONINI in the last 168 hours. BNP (last 3 results) No results for input(s): BNP in the last 8760 hours.  ProBNP (last 3 results) No results for input(s): PROBNP in the last 8760 hours.  CBG: No results for input(s): GLUCAP in the last 168 hours.  Recent Results (from the past 240 hour(s))  Culture, blood (routine x 2) Call MD if unable to obtain prior to antibiotics being given     Status: None (Preliminary result)   Collection Time: 03/05/14  6:28 AM  Result Value Ref Range Status   Specimen Description BLOOD LEFT ARM  Final   Special Requests BOTTLES DRAWN  AEROBIC AND ANAEROBIC 10CC EACH  Final   Culture   Final           BLOOD CULTURE RECEIVED NO GROWTH TO DATE CULTURE WILL BE HELD FOR 5 DAYS BEFORE ISSUING A FINAL NEGATIVE REPORT Performed at [facility]    Report Status PENDING  Incomplete  Culture, blood (routine x 2) Call MD if unable to obtain prior to antibiotics being given     Status: None (Preliminary result)   Collection Time: 03/05/14  6:35 AM  Result Value Ref Range Status   Specimen Description BLOOD LEFT ARM  Final   Special Requests BOTTLES DRAWN AEROBIC AND ANAEROBIC 10CC EACH  Final   Culture   Final           BLOOD CULTURE RECEIVED NO GROWTH TO DATE CULTURE WILL BE HELD FOR 5 DAYS BEFORE ISSUING A FINAL NEGATIVE REPORT Performed at [facility]    Report Status PENDING  Incomplete  Culture, sputum-assessment     Status: None   Collection Time: 03/05/14  6:52 AM  Result Value Ref Range Status   Specimen Description SPUTUM  Final   Special Requests NONE  Final   Sputum evaluation   Final    THIS SPECIMEN IS ACCEPTABLE. RESPIRATORY CULTURE REPORT TO FOLLOW.   Report Status 03/05/2014 FINAL  Final     Studies: Dg Chest 2 View  03/25/2014   CLINICAL DATA:  Hemoptysis.  Lung cancer.  EXAM: CHEST  2 VIEW  COMPARISON:  Chest CT 02/25/2014  FINDINGS: The cardiac silhouette, mediastinal and hilar contours are Stable. Large mass occupying the left lung apex and mediastinum with cavitation. Underlying advanced emphysematous changes and right upper lobe pulmonary scarring. No obvious acute overlying pulmonary process. No pleural effusion.  IMPRESSION: Stable large left apical lung mass with cavitation and mediastinal invasion.  No definite acute overlying pulmonary process.   Electronically Signed   By: Kalman Jewels M.D.   On: 03/01/2014 23:12    Scheduled Meds: . sodium chloride   Intravenous Once  . amLODipine  10 mg Oral Daily  . feeding supplement (ENSURE COMPLETE)  237 mL Oral TID BM  . fluticasone  2  spray Each Nare Daily  . latanoprost  1 drop Both Eyes QHS  . tamsulosin  0.4 mg Oral QPC supper  . valACYclovir  1,000 mg Oral Daily  . voriconazole  200 mg Oral Q12H   Continuous Infusions: . sodium chloride 75 mL/hr at 03/05/14 1734    Principal Problem:   Lung mass Active Problems:   History of lung cancer   CKD (chronic kidney disease) stage 3, GFR 30-59 ml/min   Aspergillus pneumonia   Hemoptysis    Time spent: 35 min    Kelvin Cellar  [facility] Pager [phone]. If 7PM-7AM, please contact night-coverage at [email or web address], password [facility] 03/06/2014, 8:19 AM  LOS: 2 days

## 2014-03-06 NOTE — Progress Notes (Signed)
PULMONARY / CRITICAL CARE MEDICINE   Name: [name] [name] MRN: [id number] DOB: 1945-04-05    ADMISSION DATE:  03/06/2014 CONSULTATION DATE:  03/05/14  REFERRING MD :  Alcario Drought  CHIEF COMPLAINT:  hemoptysis  INITIAL PRESENTATION: hemoptysis, LUL cavitary lesion with mediastinal invasion  STUDIES:  CXR: LUL cavitary lseion and mediastinal invasion  SIGNIFICANT EVENTS:   HISTORY OF PRESENT ILLNESS:   This is a 60M known smoker for a long time, non alcoholic, history of RUL NSCLC in 2008 s/p resection. Apparently in June 2015 started having LUL "pneumonia" which was treated but still slowly progressing, becoming bigger, mass-like, with cavitary component and air fluid level. Has had bronch and TBBx that showed no atypia but did have some hyphae (but no growth), with positive aspergillus Ab thus he was being treated with voriconazole as outpatient for a few months (for the most part compliant although he admits to missing his med once in a while). Despite this the LUL lesion keeps getting bigger. Then yesterday he suddenly started having hemoptysis described as blood clots subjectively totalling for about a cup. Also has chest pain across his upper chest radiating to the back, worsened by direct pressure and lying down. He reported losing about 40 pounds the past year or so. No fever, had chills. No N/V, no abd pain, no rash, no diarrhea, no dysuria, no rash. He was from [place] but has lived in [place] for more than 10 years. No pets, no known exposure, no molds in the house. Not diabetic.    SUBJECTIVE: Asks to be fed.  Still intermittent hemoptysis- clots. I discussed possibility of bronchoscopy for bx LUL mass. Risks esp of respiratory distress and increased bleeding.  VITAL SIGNS: Temp:  [98.9 F (37.2 C)-100.6 F (38.1 C)] 98.9 F (37.2 C) (02/07 1242) Pulse Rate:  [63-90] 64 (02/07 1242) Resp:  [18-20] 18 (02/07 1242) BP: (104-125)/(52-85) 104/60 mmHg (02/07 1242) SpO2:  [96 %-100  %] 99 % (02/07 0959) HEMODYNAMICS:   VENTILATOR SETTINGS:   INTAKE / OUTPUT:  Intake/Output Summary (Last 24 hours) at 03/06/14 1433 Last data filed at 03/06/14 1242  Gross per 24 hour  Intake   3440 ml  Output    775 ml  Net   2665 ml    PHYSICAL EXAMINATION: General:  Appears thin, cachectic, uncomfortable at rest. Tender L suprascapular and shoulder area. Neuro:  No focal weakness, no facial droop, no slurred speech. Mood is quietly angry/ accusatory HEENT:  Atraumatic, no stridor Cardiovascular:  RRR, no loud murmur Lungs:  Inspiratory rales LLL, decreased breath sounds LUL, no wheeze Abdomen:  Soft, nontender, no guarding Musculoskeletal:  No deformities, no leg edema Skin:  No ecchymosis  LABS:  CBC  Recent Labs Lab 03/15/2014 2310 03/06/14 0535  WBC 27.8* 25.7*  HGB 10.4* 8.3*  HCT 30.5* 25.6*  PLT 502* 439*   Coag's  Recent Labs Lab 03/05/14 0628  APTT 54*  INR 1.17   BMET  Recent Labs Lab 03/13/2014 2310 03/06/14 0535  NA 140 138  K 3.9 3.9  CL 98 104  CO2 30 26  BUN 24* 17  CREATININE 1.59* 1.22  GLUCOSE 93 79   Electrolytes  Recent Labs Lab 03/22/2014 2310 03/06/14 0535  CALCIUM 8.9 8.2*   Sepsis Markers No results for input(s): LATICACIDVEN, PROCALCITON, O2SATVEN in the last 168 hours. ABG No results for input(s): PHART, PCO2ART, PO2ART in the last 168 hours. Liver Enzymes No results for input(s): AST, ALT, ALKPHOS, BILITOT, ALBUMIN in the  last 168 hours. Cardiac Enzymes No results for input(s): TROPONINI, PROBNP in the last 168 hours. Glucose No results for input(s): GLUCAP in the last 168 hours.  Imaging No results found. CXR and latest CT reviewed 2/6  ASSESSMENT / PLAN:  PULMONARY OETT A: hemoptysis likely related to invasion by the LUL cavitary lesion, difficult to distinguish between malignancy vs fungal infection/abscess. Leaning slightly more towards malignancy but abscess/invasive fungal infection is a close second.  Would not be surprised if he has both superimposed on each other. P:   He has CT bx scheduled for 2/8. If bleeding slows for visibility, may be able to bronch sooner. Need fungal cultures and cytopath regardless. Quantify hemoptysis Check PT/aPTT Avoid all blood thinner Clear liquids for now in case he needs intubation of procedure May continue voriconazole for now. Follow up sputum culture. 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 Possible superimposed abscess P:   BCx2  UC  Sputum pending Abx: voriconazole  ENDOCRINE A:  No acute issue P:   monitor  NEUROLOGIC A:  No acute issue P:   RASS goal:     FAMILY  - Updates:   - Inter-disciplinary family meet or Palliative Care meeting due by:      TODAY'S SUMMARY:  12M with new hemoptysis, weight loss, LUL mass/cavitary lesion invading mediastinum and spine and possibly abscess in the setting of possible aspergillus in the past, smoker. Concerning for either worsening invasive aspergillus vs malignancy. Likely needs repeat bronchoscopy. Maybe even surgical intervention. HAs history or NSCLC s/p resection in 2008 He was scheduled for CT bx 2/18.   He is coughing less blood. I have changed diet to full liquid, to hold after midnight tonight. Tentatively PCCM can do bronchoscopy tomorrow or Tuesday     CD Annamaria Boots, MD Pulmonary and [place] Jerilynn Mages [phone] Pager: [phone]  03/06/2014, 2:33 PM

## 2014-03-06 NOTE — Progress Notes (Signed)
Had about 30 cc's total in past 12 hours of dark reddish brown thick sputum. Continues to struggle with chronic pain issues and has been receiving 1 mg of Dilaudid q 2 hrs rates pain between 6 and 9 with periods of sleep noted.  Pt would like "Demerol" for pain.  States he used to get it all the time when in the hospital.  Education given and verbalizes understanding.  Temp high this shift was 100.6 and it has decreased to 99.5 this AM

## 2014-03-07 ENCOUNTER — Inpatient Hospital Stay (HOSPITAL_COMMUNITY): Payer: Medicare Other

## 2014-03-07 ENCOUNTER — Encounter (HOSPITAL_COMMUNITY): Admission: EM | Disposition: E | Payer: Self-pay | Source: Home / Self Care | Attending: Pulmonary Disease

## 2014-03-07 DIAGNOSIS — Z85118 Personal history of other malignant neoplasm of bronchus and lung: Secondary | ICD-10-CM | POA: Diagnosis not present

## 2014-03-07 DIAGNOSIS — Z8546 Personal history of malignant neoplasm of prostate: Secondary | ICD-10-CM | POA: Diagnosis not present

## 2014-03-07 DIAGNOSIS — J9690 Respiratory failure, unspecified, unspecified whether with hypoxia or hypercapnia: Secondary | ICD-10-CM | POA: Diagnosis not present

## 2014-03-07 DIAGNOSIS — R918 Other nonspecific abnormal finding of lung field: Secondary | ICD-10-CM | POA: Diagnosis not present

## 2014-03-07 HISTORY — PX: VIDEO BRONCHOSCOPY: SHX5072

## 2014-03-07 LAB — TYPE AND SCREEN
ABO/RH(D): O POS
ANTIBODY SCREEN: NEGATIVE
Unit division: 0

## 2014-03-07 LAB — BASIC METABOLIC PANEL
Anion gap: 10 (ref 5–15)
BUN: 15 mg/dL (ref 6–23)
CALCIUM: 7.9 mg/dL — AB (ref 8.4–10.5)
CHLORIDE: 101 mmol/L (ref 96–112)
CO2: 24 mmol/L (ref 19–32)
Creatinine, Ser: 1.17 mg/dL (ref 0.50–1.35)
GFR calc Af Amer: 73 mL/min — ABNORMAL LOW (ref >90)
GFR calc non Af Amer: 63 mL/min — ABNORMAL LOW (ref >90)
GLUCOSE: 93 mg/dL (ref 70–99)
Potassium: 4.1 mmol/L (ref 3.5–5.1)
Sodium: 135 mmol/L (ref 135–145)

## 2014-03-07 LAB — CBC
HCT: UNDETERMINED % (ref 39.0–52.0)
HEMATOCRIT: 28.7 % — AB (ref 39.0–52.0)
HEMOGLOBIN: UNDETERMINED g/dL (ref 13.0–17.0)
Hemoglobin: 9.7 g/dL — ABNORMAL LOW (ref 13.0–17.0)
MCH: UNDETERMINED pg (ref 26.0–34.0)
MCH: 27.9 pg (ref 26.0–34.0)
MCHC: UNDETERMINED g/dL (ref 30.0–36.0)
MCHC: 33.8 g/dL (ref 30.0–36.0)
MCV: UNDETERMINED fL (ref 78.0–100.0)
MCV: 82.5 fL (ref 78.0–100.0)
PLATELETS: 440 10*3/uL — AB (ref 150–400)
Platelets: UNDETERMINED 10*3/uL (ref 150–400)
RBC: UNDETERMINED MIL/uL (ref 4.22–5.81)
RBC: 3.48 MIL/uL — ABNORMAL LOW (ref 4.22–5.81)
RDW: UNDETERMINED % (ref 11.5–15.5)
RDW: 15.4 % (ref 11.5–15.5)
WBC: UNDETERMINED 10*3/uL (ref 4.0–10.5)
WBC: 29.8 10*3/uL — ABNORMAL HIGH (ref 4.0–10.5)

## 2014-03-07 LAB — CULTURE, RESPIRATORY
CULTURE: NORMAL
GRAM STAIN: NONE SEEN

## 2014-03-07 LAB — CULTURE, RESPIRATORY W GRAM STAIN

## 2014-03-07 SURGERY — BRONCHOSCOPY, WITH FLUOROSCOPY
Anesthesia: Moderate Sedation | Laterality: Bilateral

## 2014-03-07 MED ORDER — LIDOCAINE HCL 2 % EX GEL
1.0000 "application " | Freq: Once | CUTANEOUS | Status: DC
  Filled 2014-03-07: qty 5

## 2014-03-07 MED ORDER — MIDAZOLAM HCL 10 MG/2ML IJ SOLN
INTRAMUSCULAR | Status: AC
  Filled 2014-03-07: qty 4

## 2014-03-07 MED ORDER — LIDOCAINE HCL 2 % EX GEL
CUTANEOUS | Status: DC | PRN
  Administered 2014-03-07: 1

## 2014-03-07 MED ORDER — PHENYLEPHRINE HCL 0.25 % NA SOLN
NASAL | Status: DC | PRN
Start: 2014-03-07 — End: 2014-03-07
  Administered 2014-03-07: 2 via NASAL

## 2014-03-07 MED ORDER — ACETAMINOPHEN 325 MG PO TABS
650.0000 mg | ORAL_TABLET | ORAL | Status: DC | PRN
  Administered 2014-03-07 – 2014-03-18 (×8): 650 mg via ORAL
  Filled 2014-03-07 (×8): qty 2

## 2014-03-07 MED ORDER — BUTAMBEN-TETRACAINE-BENZOCAINE 2-2-14 % EX AERO
1.0000 | INHALATION_SPRAY | Freq: Once | CUTANEOUS | Status: DC
  Filled 2014-03-07: qty 20

## 2014-03-07 MED ORDER — LIDOCAINE HCL 1 % IJ SOLN
INTRAMUSCULAR | Status: DC | PRN
  Administered 2014-03-07: 6 mL via RESPIRATORY_TRACT

## 2014-03-07 MED ORDER — PHENYLEPHRINE HCL 0.25 % NA SOLN
1.0000 | Freq: Four times a day (QID) | NASAL | Status: DC | PRN
  Filled 2014-03-07: qty 15

## 2014-03-07 MED ORDER — MIDAZOLAM HCL 10 MG/2ML IJ SOLN
INTRAMUSCULAR | Status: DC | PRN
  Administered 2014-03-07: 2 mg via INTRAVENOUS

## 2014-03-07 MED ORDER — FENTANYL CITRATE 0.05 MG/ML IJ SOLN
INTRAMUSCULAR | Status: DC | PRN
  Administered 2014-03-07 (×2): 50 ug via INTRAVENOUS

## 2014-03-07 MED ORDER — FENTANYL CITRATE 0.05 MG/ML IJ SOLN
INTRAMUSCULAR | Status: AC
  Filled 2014-03-07: qty 4

## 2014-03-07 MED ORDER — SODIUM CHLORIDE 0.9 % IV SOLN
INTRAVENOUS | Status: DC
  Administered 2014-03-07: 11:00:00 via INTRAVENOUS

## 2014-03-07 NOTE — Op Note (Addendum)
PCCM Bronchoscopy Procedure Note  The patient was informed of the risks (including but not limited to bleeding, infection, respiratory failure, lung injury, tooth/oral injury) and benefits of the procedure and gave consent, see chart.  Indication: left upper lobe mass, hemoptysis  Post procedure Diagnosis: Left upper lung mass  Location: [facility] Endoscopy suite  Condition pre procedure: stable  Medications for procedure: Versed 2mg , Fentanyl 143mcg IV  Procedure description and findings: The bronchoscope was introduced through the nose and passed to the bilateral lungs to the level of the subsegmental bronchi throughout the tracheobronchial tree.  Airway exam revealed decreased movement of the left true vocal cord.  The trachea was normal in appearance.  The right tracheobronchial tree was normal without lesion though the right upper lobe orifice was surgically absent.    The left tracheobronchial tree mucosa was nodular, and chronically inflamed in appearance.  The left lower lobe had moderate chronic inflamatory changes.  However the left upper lobe was markedly abnormal, particularly the superior segment which was initially completely occluded with a thick, yellow mucus plug.  This was aspirated after washing and a considerable amount of time was spent suctioning this until the airway was clear. However the mucosa there was friable and appeared markedly abnormal.  There was evidence of old blood suctioned from the lingula but none from the left upper lobe.  See pictures from the bronchoscopy scanned into the chart.   Procedures performed:  Therapeutic aspiration of the superior segment of the left upper lobe. BAL was performed from the superior segment of the left upper lobe.   Endobronchial and transbronchial brushing of the left upper lobe superior segment (4 total passes). Endobronchial and transbronchial biopsy of the left upper lobe superior segment (7 total biopsies  taken).  Specimens sent:  Surgical pathology> biopsy Routine cytology> brushing and BAL Bacterial and fungal cultures > BAL   Condition post procedure: Stable  EBL: < 5cc  Complications: None  Patient instructions: back to hospital  [name], MD [place] [facility] Pager: [phone] Cell: [phone] If no response, call [phone]

## 2014-03-07 NOTE — Telephone Encounter (Signed)
He was hospitalized shortly after he placed his phone call. He was admitted with recent onset of hemoptysis. He underwent bronchoscopy with biopsies today.

## 2014-03-07 NOTE — H&P (Signed)
[facility] PCCM  Mr. Bielinski has an enlarging mass in his left upper lobe which has been treated for aspergillus disease.  However his ongoing cachexia, dyspnea and chest pain brought him in to the hospital  O: Filed Vitals:   03/06/14 2052 03/06/14 2259 03/02/2014 0557 03/17/2014 0558  BP: 119/55  122/52   Pulse: 75  92   Temp: 101 F (38.3 C) 100.2 F (37.9 C) 100.4 F (38 C)   TempSrc: Oral  Oral   Resp: 20  20   Height:      Weight:      SpO2: 97%  85% 95%   Gen: mild pain, cachexia HEENT: NCAT PULM: diminished left upper lobe CV: RRR, no mgr AB: BS+, soft, nontender Ext: thin Neuro: awake and alert   CT images reviewed by me Labs, medications reviewed by me  Impression: 1) large left upper lobe mass 2) Aspergillus lung disease 3) cachexia  Plan: 1) Bronchoscopy with biopsy today, discussed with patient 2/8  [name], MD [place] [facility] Pager: [phone] Cell: [phone] If no response, call [phone]

## 2014-03-07 NOTE — Progress Notes (Signed)
[facility] Progress Note Patient Name: [name] [name] DOB: 11-30-1945 MRN: [id number]   Date of Service  03/23/2014  HPI/Events of Note  Call from nurse that patient has sats of 85% on 6 L [place].  Patient is refusing FM O2 - had lung bx today.  eICU Interventions  Plan: Stat ABG Stat PCXR     Intervention Category Intermediate Interventions: Respiratory distress - evaluation and management  Tedra Coppernoll 03/16/2014, 11:31 PM

## 2014-03-07 NOTE — Progress Notes (Addendum)
[facility] PROGRESS NOTE  [name] [name] FTD:[id number] DOB: [birth year] DOA: 03/10/2014 PCP: Garnet Koyanagi, DO   HPI/Subjective: Patient is a 69 year old gentleman with a past medical history of tobacco abuse, non-small cell lung cancer status post resection without adjuvant therapy, having a chest x-ray on 07/08/2013 which revealed extensive primary interstitial pneumonitis in the posterior segment of the left upper lobe. On 07/29/2013 repeat chest x-ray showed progression of consolidation in the left upper lobe with suspicious areas of cavitation. A sputum culture from 08/06/2013 growing Mycobacterium. AFB cultures were negative. Patient undergoing a bronchoscopy in August 2015 with cultures negative however pathology revealed fungal hyphae. Aspergillus serology positive. He was admitted to the medicine service on 10/03/2013 at which time he was started on voriconazole. CT scan of lungs performed on 10/04/2013 showed extensive infiltrative/inflammatory process in the left lung with large cavitary process. He was discharged on IV Voriconazole, Zosyn, vancomycin that hospitalization. A repeat CT scan of chest performed on 02/25/2014 showed increased size and bulky peripheral soft tissue component of masslike opacity in the left upper lobe with involvement of the left ilium and mediastinum. Also noted was new bone destruction of T5 vertebral body consistent with osseous involvement. Patient admitted to the medicine service on 03/05/2014 presenting with complaints of hemoptysis. Patient was evaluated by pulmonary/critical care medicine recommending continuing current antimicrobial therapy along with bronchoscopy.  Subjective: Patient ignoring me and looking at his phone after I introduced myself. Only asks who is going to do the procedure and will not talk with me further.   Assessment/Plan: 1. Left upper lobe cavitary lesion- h/o lung CA as mentioned above -Patient found to have changes to left upper  lobe from a chest x-ray in July 2015, which has progressed to cavitary process. -In the month of September he was treated with IV vancomycin, Zosyn and voriconazole -on voriconazole 200 mg by mouth twice a day -for bronch today to r/o recurrent cancer- management per pulm team- prognosis is poor  2.  Hemoptysis -Likely secondary to left upper lobe cavitary lesion -Repeat chest x-ray performed showing stable left large apical lung mass  3.  Leukocytosis -Initial labs showing elevated white count of 27,800, likely due to underlying infectious process and perhaps malignancy  4.  Chronic obstructive pulmonary disease -Stable   Code Status: Full code Family Communication:  Disposition Plan: plan per pulmonary- will d/c home when given clearance by them   Consultants:  Pulmonary critical care medicine   Antibiotics: Anti-infectives    Start     Dose/Rate Route Frequency Ordered Stop   03/05/14 1000  valACYclovir (VALTREX) tablet 1,000 mg     1,000 mg Oral Daily 03/05/14 0152     03/05/14 1000  voriconazole (VFEND) tablet 200 mg     200 mg Oral Every 12 hours 03/05/14 0152         Objective: Filed Vitals:   03/12/2014 0557  BP: 122/52  Pulse: 92  Temp: 100.4 F (38 C)  Resp: 20    Intake/Output Summary (Last 24 hours) at 03/06/2014 1025 Last data filed at 03/20/2014 0900  Gross per 24 hour  Intake   2460 ml  Output   2940 ml  Net   -480 ml   Filed Weights   03/05/14 0216  Weight: 54.568 kg (120 lb 4.8 oz)    Exam:   General: cachectic, no acute distress  Cardiovascular: Regular rate and rhythm normal S1-S2  Respiratory: Diminished breath sounds bilaterally, decreased breath sounds bilaterally, scattered end expiratory wheezes,  on supplemental oxygen  Abdomen: Soft nontender nondistended  Musculoskeletal: Bilateral muscle atrophy  Data Reviewed: Basic Metabolic Panel:  Recent Labs Lab 03/23/2014 2310 03/06/14 0535 03/16/2014 0730  NA 140 138 135  K 3.9  3.9 4.1  CL 98 104 101  CO2 30 26 24   GLUCOSE 93 79 93  BUN 24* 17 15  CREATININE 1.59* 1.22 1.17  CALCIUM 8.9 8.2* 7.9*   Liver Function Tests: No results for input(s): AST, ALT, ALKPHOS, BILITOT, PROT, ALBUMIN in the last 168 hours. No results for input(s): LIPASE, AMYLASE in the last 168 hours. No results for input(s): AMMONIA in the last 168 hours. CBC:  Recent Labs Lab 03/05/2014 2310 03/06/14 0535 03/09/2014 0527 03/06/2014 0730  WBC 27.8* 25.7* SPECIMEN CONTAMINATED, UNABLE TO PERFORM TEST(S). 29.8*  NEUTROABS 21.7*  --   --   --   HGB 10.4* 8.3* SPECIMEN CONTAMINATED, UNABLE TO PERFORM TEST(S). 9.7*  HCT 30.5* 25.6* SPECIMEN CONTAMINATED, UNABLE TO PERFORM TEST(S). 28.7*  MCV 81.8 81.8 SPECIMEN CONTAMINATED, UNABLE TO PERFORM TEST(S). 82.5  PLT 502* 439* SPECIMEN CONTAMINATED, UNABLE TO PERFORM TEST(S). 440*   Cardiac Enzymes: No results for input(s): CKTOTAL, CKMB, CKMBINDEX, TROPONINI in the last 168 hours. BNP (last 3 results) No results for input(s): BNP in the last 8760 hours.  ProBNP (last 3 results) No results for input(s): PROBNP in the last 8760 hours.  CBG: No results for input(s): GLUCAP in the last 168 hours.  Recent Results (from the past 240 hour(s))  Culture, blood (routine x 2) Call MD if unable to obtain prior to antibiotics being given     Status: None (Preliminary result)   Collection Time: 03/05/14  6:28 AM  Result Value Ref Range Status   Specimen Description BLOOD LEFT ARM  Final   Special Requests BOTTLES DRAWN AEROBIC AND ANAEROBIC 10CC EACH  Final   Culture   Final           BLOOD CULTURE RECEIVED NO GROWTH TO DATE CULTURE WILL BE HELD FOR 5 DAYS BEFORE ISSUING A FINAL NEGATIVE REPORT Performed at [facility]    Report Status PENDING  Incomplete  Culture, blood (routine x 2) Call MD if unable to obtain prior to antibiotics being given     Status: None (Preliminary result)   Collection Time: 03/05/14  6:35 AM  Result Value Ref  Range Status   Specimen Description BLOOD LEFT ARM  Final   Special Requests BOTTLES DRAWN AEROBIC AND ANAEROBIC 10CC EACH  Final   Culture   Final           BLOOD CULTURE RECEIVED NO GROWTH TO DATE CULTURE WILL BE HELD FOR 5 DAYS BEFORE ISSUING A FINAL NEGATIVE REPORT Performed at [facility]    Report Status PENDING  Incomplete  Culture, sputum-assessment     Status: None   Collection Time: 03/05/14  6:52 AM  Result Value Ref Range Status   Specimen Description SPUTUM  Final   Special Requests NONE  Final   Sputum evaluation   Final    THIS SPECIMEN IS ACCEPTABLE. RESPIRATORY CULTURE REPORT TO FOLLOW.   Report Status 03/05/2014 FINAL  Final  Fungus Culture with Smear     Status: None (Preliminary result)   Collection Time: 03/05/14  6:52 AM  Result Value Ref Range Status   Specimen Description TRACHEAL SITE  Final   Special Requests NONE  Final   Fungal Smear   Final    NO YEAST OR FUNGAL ELEMENTS SEEN  Performed at [facility]   Final    CULTURE IN PROGRESS FOR FOUR WEEKS Performed at [facility]    Report Status PENDING  Incomplete  Culture, respiratory (NON-Expectorated)     Status: None (Preliminary result)   Collection Time: 03/05/14  6:52 AM  Result Value Ref Range Status   Specimen Description SPUTUM  Final   Special Requests NONE  Final   Gram Stain PENDING  Incomplete   Culture   Final    Culture reincubated for better growth Performed at [facility]    Report Status PENDING  Incomplete     Studies: No results found.  Scheduled Meds: . amLODipine  10 mg Oral Daily  . feeding supplement (ENSURE COMPLETE)  237 mL Oral TID BM  . fluticasone  2 spray Each Nare Daily  . latanoprost  1 drop Both Eyes QHS  . tamsulosin  0.4 mg Oral QPC supper  . valACYclovir  1,000 mg Oral Daily  . voriconazole  200 mg Oral Q12H   Continuous Infusions: . sodium chloride 75 mL/hr at 03/06/14 1500       Time spent: 30  min    [place] [facility] Pager -  [email or web address], password [facility] 03/03/2014, 10:25 AM  LOS: 3 days

## 2014-03-07 NOTE — Progress Notes (Signed)
Video bronchoscopy done. Intervention bronchial washing Intervention bronchial brushing Intervention bronchial biopsy

## 2014-03-07 NOTE — Care Management Note (Addendum)
    Page 1 of 1   03/06/2014     11:54:59 AM CARE MANAGEMENT NOTE 03/06/2014  Patient:  [name] [name]   Account Number:  [id number]  Date Initiated:  03/22/2014  Documentation initiated by:  Dessa Phi  Subjective/Objective Assessment:   69 y/o m admitted w/Lung mass.MK:LKJZ Ca.     Action/Plan:   From home.   Anticipated DC Date:  03/22/2014   Anticipated DC Plan:  [place]  CM consult      Choice offered to / List presented to:             Status of service:  In process, will continue to follow Medicare Important Message given?  YES (If response is "NO", the following Medicare IM given date fields will be blank) Date Medicare IM given:  03/08/2014 Medicare IM given by:  [facility] Date Additional Medicare IM given:   Additional Medicare IM given by:    Discharge Disposition:    Per UR Regulation:  Reviewed for med. necessity/level of care/duration of stay  If discussed at [place] of Stay Meetings, dates discussed:    Comments:  03/13/2014 Dessa Phi RN BSN NCM [phone] Bronchoscopy today.Noted may d/c today post procedure if stableNo anticipated d/c needs.

## 2014-03-08 ENCOUNTER — Encounter (HOSPITAL_COMMUNITY): Payer: Self-pay | Admitting: Pulmonary Disease

## 2014-03-08 ENCOUNTER — Ambulatory Visit: Payer: Medicare Other | Admitting: Internal Medicine

## 2014-03-08 DIAGNOSIS — J9601 Acute respiratory failure with hypoxia: Secondary | ICD-10-CM

## 2014-03-08 DIAGNOSIS — J189 Pneumonia, unspecified organism: Secondary | ICD-10-CM

## 2014-03-08 LAB — BLOOD GAS, ARTERIAL
Acid-base deficit: 1 mmol/L (ref 0.0–2.0)
BICARBONATE: 23.1 meq/L (ref 20.0–24.0)
DRAWN BY: 11249
FIO2: 0.4 %
O2 Content: 8 L/min
O2 SAT: 90.5 %
PATIENT TEMPERATURE: 99.2
PCO2 ART: 39.2 mmHg (ref 35.0–45.0)
PH ART: 7.389 (ref 7.350–7.450)
PO2 ART: 63.2 mmHg — AB (ref 80.0–100.0)
TCO2: 21.5 mmol/L (ref 0–100)

## 2014-03-08 LAB — BRAIN NATRIURETIC PEPTIDE: B Natriuretic Peptide: 443 pg/mL — ABNORMAL HIGH (ref 0.0–100.0)

## 2014-03-08 LAB — MRSA PCR SCREENING: MRSA by PCR: NEGATIVE

## 2014-03-08 MED ORDER — VANCOMYCIN HCL IN DEXTROSE 1-5 GM/200ML-% IV SOLN
1000.0000 mg | Freq: Once | INTRAVENOUS | Status: AC
  Administered 2014-03-08: 1000 mg via INTRAVENOUS
  Filled 2014-03-08: qty 200

## 2014-03-08 MED ORDER — CETYLPYRIDINIUM CHLORIDE 0.05 % MT LIQD
7.0000 mL | Freq: Two times a day (BID) | OROMUCOSAL | Status: DC
  Administered 2014-03-08 – 2014-03-10 (×5): 7 mL via OROMUCOSAL

## 2014-03-08 MED ORDER — GUAIFENESIN 200 MG PO TABS: 600.0000 mg | ORAL_TABLET | Freq: Two times a day (BID) | ORAL | Status: DC

## 2014-03-08 MED ORDER — VANCOMYCIN HCL 500 MG IV SOLR
500.0000 mg | Freq: Two times a day (BID) | INTRAVENOUS | Status: DC
  Administered 2014-03-08 – 2014-03-17 (×18): 500 mg via INTRAVENOUS
  Filled 2014-03-08 (×21): qty 500

## 2014-03-08 MED ORDER — PIPERACILLIN-TAZOBACTAM 3.375 G IVPB
3.3750 g | Freq: Three times a day (TID) | INTRAVENOUS | Status: AC
  Administered 2014-03-08 – 2014-03-17 (×29): 3.375 g via INTRAVENOUS
  Filled 2014-03-08 (×22): qty 50

## 2014-03-08 MED ORDER — GUAIFENESIN ER 600 MG PO TB12
600.0000 mg | ORAL_TABLET | Freq: Two times a day (BID) | ORAL | Status: DC
  Administered 2014-03-08 – 2014-03-09 (×4): 600 mg via ORAL
  Filled 2014-03-08 (×5): qty 1

## 2014-03-08 NOTE — Progress Notes (Signed)
PULMONARY / CRITICAL CARE MEDICINE   Name: [name] [name] MRN: [id number] DOB: February 19, 1945    ADMISSION DATE:  03/01/2014 CONSULTATION DATE:  03/05/14  REFERRING MD :  Alcario Drought  CHIEF COMPLAINT:  hemoptysis  INITIAL PRESENTATION: hemoptysis, LUL cavitary lesion with mediastinal invasion  STUDIES:  CXR: LUL cavitary lseion and mediastinal invasion  SIGNIFICANT EVENTS:   2/9 Bronchoscopy> mucus plugging LUL, mass left upper lobe, biopsy, brushing, washing   SUBJECTIVE: Bronch yesterday, became more hypoxemic overnight.  He says he feels a little worse than baseline but not by much.  Coughing up mucus, no blood.  VITAL SIGNS: Temp:  [98.5 F (36.9 C)-100.6 F (38.1 C)] 100.4 F (38 C) (02/09 0745) Pulse Rate:  [64-118] 85 (02/09 0700) Resp:  [9-27] 11 (02/09 0700) BP: (86-134)/(43-79) 104/55 mmHg (02/09 0700) SpO2:  [63 %-97 %] 97 % (02/09 0700) FiO2 (%):  [40 %-100 %] 100 % (02/09 0400) HEMODYNAMICS:   VENTILATOR SETTINGS: Vent Mode:  [-]  FiO2 (%):  [40 %-100 %] 100 % INTAKE / OUTPUT:  Intake/Output Summary (Last 24 hours) at 03/08/14 0846 Last data filed at 03/08/14 0827  Gross per 24 hour  Intake 2197.5 ml  Output   1050 ml  Net 1147.5 ml    PHYSICAL EXAMINATION: General:  Thin, cachectic, breathing comfortably HEENT: NCAT EOMi PULM : diminished left upper lobe, bilateral crackles CV: RRR, no mgr AB: BS+, soft, nontender Ext: warm, no edema Neuro: A&Ox4, maew  LABS:  CBC  Recent Labs Lab 03/06/14 0535 02/28/2014 0527 03/14/2014 0730  WBC 25.7* SPECIMEN CONTAMINATED, UNABLE TO PERFORM TEST(S). 29.8*  HGB 8.3* SPECIMEN CONTAMINATED, UNABLE TO PERFORM TEST(S). 9.7*  HCT 25.6* SPECIMEN CONTAMINATED, UNABLE TO PERFORM TEST(S). 28.7*  PLT 439* SPECIMEN CONTAMINATED, UNABLE TO PERFORM TEST(S). 440*   Coag's  Recent Labs Lab 03/05/14 0628  APTT 54*  INR 1.17   BMET  Recent Labs Lab 03/12/2014 2310 03/06/14 0535 03/15/2014 0730  NA 140 138 135  K 3.9  3.9 4.1  CL 98 104 101  CO2 30 26 24   BUN 24* 17 15  CREATININE 1.59* 1.22 1.17  GLUCOSE 93 79 93   Electrolytes  Recent Labs Lab 03/25/2014 2310 03/06/14 0535 03/12/2014 0730  CALCIUM 8.9 8.2* 7.9*   Sepsis Markers No results for input(s): LATICACIDVEN, PROCALCITON, O2SATVEN in the last 168 hours. ABG  Recent Labs Lab 03/08/14 0017  PHART 7.389  PCO2ART 39.2  PO2ART 63.2*   Liver Enzymes No results for input(s): AST, ALT, ALKPHOS, BILITOT, ALBUMIN in the last 168 hours. Cardiac Enzymes No results for input(s): TROPONINI, PROBNP in the last 168 hours. Glucose No results for input(s): GLUCAP in the last 168 hours.  Imaging Dg Chest Port 1 View  03/08/2014   CLINICAL DATA:  Decreased O2 saturation.  Initial encounter.  EXAM: PORTABLE CHEST - 1 VIEW  COMPARISON:  Chest radiograph performed earlier today at 12:41 p.m.  FINDINGS: There is new right mid lung zone and worsening left midlung zone airspace opacity, concerning for superimposed pneumonia, though mild interstitial edema might have a similar appearance. Underlying chronic lung changes are again seen, with scarring at the left lung base and the known mass at the left lung apex.  No definite pleural effusion or pneumothorax is seen. Mild scarring is again noted at the right lung apex.  The cardiomediastinal silhouette is borderline normal in size. No acute osseous abnormalities are identified. Cervical spinal fusion hardware is partially imaged.  IMPRESSION: 1. New right mid lung zone and  worsening left mid lung zone airspace opacity, concerning for new superimposed pneumonia, though mild interstitial edema might have a similar appearance. 2. Underlying left apical lung mass and chronic lung changes again seen.   Electronically Signed   By: Garald Balding M.D.   On: 03/08/2014 00:11   Dg Chest Port 1 View  03/25/2014   CLINICAL DATA:  Left upper lobe transbronchial biopsy, hypertension, COPD, lung cancer  EXAM: PORTABLE CHEST - 1  VIEW  COMPARISON:  03/11/2014  FINDINGS: Similar background hyperinflation with parenchymal scarring compatible with COPD/emphysema. Large left upper lobe/ hilar mass / consolidation process. No significant interval change. No effusion or pneumothorax evident. Slight tracheal deviation to the right as before.  IMPRESSION: Stable large left upper lobe/hilar mass and background COPD/ emphysema.  No superimposed enlarging effusion or pneumothorax following bronchoscopy.   Electronically Signed   By: Daryll Brod M.D.   On: 03/01/2014 12:53   Dg C-arm 1-60 Min-no Report  03/10/2014   CLINICAL DATA: left side bronchoscopy   C-ARM 1-60 MINUTES  Fluoroscopy was utilized by the requesting physician.  No radiographic  interpretation.    CXR and latest CT reviewed 2/6  ASSESSMENT / PLAN:  PULMONARY OETT A: Large left upper lobe mass, prior lung cancer and known fungal disease> s/p bronchoscopy 2/8 Acute on chronic hypoxemic respiratory failure > appears to be due to HCAP vs aspiration pneumonitis, does not appear volume up COPD Known pulmonary fungal disease, treated as aspergillus with voriconazole P:   F/u bronchoscopy results Treat for HCAP, see ID Flutter valve Guaifenesin Titrate O2 to O2 saturation > 88% Check proBNP  CARDIOVASCULAR CVL A: HTN P:  Monitor Tele  RENAL A:  CKD St 3 stable P:   Avoid nephrotoxic meds, monitor electrolytes  GASTROINTESTINAL A:  No acute issues P:   Monitor Regular diet  HEMATOLOGIC A:  Chronic anemia P:  monitor  INFECTIOUS A:  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  S/P bronchoscopy on 2/8, then developed HCAP vs aspiration 2/9 early AM.  Treat as HCAP, f/u bronch and cultures.  [name], MD [place] [facility] Pager: [phone] Cell: [phone] If no response, call [phone]    03/08/2014, 8:46 AM

## 2014-03-08 NOTE — Progress Notes (Signed)
Hospitalist on call notified about eleavated temp ( 100.8) and low O2 sat- 74% RA. New order received for Tylenol PRN and to notify critical care.

## 2014-03-08 NOTE — Progress Notes (Addendum)
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 On 07/29/2013 repeat chest x-ray showed progression of consolidation in the left upper lobe with suspicious areas of cavitation. A sputum culture from 08/06/2013 growing Mycobacterium. AFB cultures were negative. Patient undergoing a bronchoscopy in August 2015 with cultures negative however pathology revealed fungal hyphae. Aspergillus serology positive. He was admitted to the medicine service on 10/03/2013 at which time he was started on voriconazole. CT scan of lungs performed on 10/04/2013 showed extensive infiltrative/inflammatory process in the left lung with large cavitary process. He was discharged on IV Voriconazole, Zosyn, vancomycin that hospitalization. A repeat CT scan of chest performed on 02/25/2014 showed increased size and bulky peripheral soft tissue component of masslike opacity in the left upper lobe with involvement of the left ilium and mediastinum. Also noted was new bone destruction of T5 vertebral body consistent with osseous involvement. Patient admitted to the medicine service on 03/05/2014 presenting with complaints of hemoptysis. Patient was evaluated by pulmonary/critical care medicine recommending continuing current antimicrobial therapy along with bronchoscopy.  Subjective: Still not communicating with me today. Has no complaints.    Assessment/Plan: Left upper lobe cavitary lesion- h/o lung CA as mentioned above -Patient found to have changes to left upper lobe from a chest x-ray in July 2015, which has progressed to cavitary process. -In the  month of September he was treated with IV vancomycin, Zosyn and voriconazole -on voriconazole 200 mg by mouth twice a day -s/p bronch- await biopsy results-   HCAP-  Leukocytosis - Dr Lake Bells suspecting pneumonia- possibly aspiration during bronch- - has started patients on antibiotics today. - wean O2 as able- in SDU now after bronch yesterday   Hemoptysis -Likely secondary to left upper lobe cavitary lesion -Repeat chest x-ray performed showing stable left large apical lung mass  Chronic obstructive pulmonary disease -Stable   Code Status: Full code Family Communication:  Disposition Plan: plan per pulmonary- will d/c home when given clearance by them   Consultants:  Pulmonary critical care medicine   Antibiotics: Anti-infectives    Start     Dose/Rate Route Frequency Ordered Stop   03/08/14 2200  vancomycin (VANCOCIN) 500 mg in sodium chloride 0.9 % 100 mL IVPB     500 mg100 mL/hr over 60 Minutes Intravenous Every 12 hours 03/08/14 0931     03/08/14 1100  vancomycin (VANCOCIN) IVPB 1000 mg/200 mL premix     1,000 mg200 mL/hr over 60 Minutes Intravenous  Once 03/08/14 0931     03/08/14 1100  piperacillin-tazobactam (ZOSYN) IVPB 3.375 g     3.375 g12.5 mL/hr over 240 Minutes Intravenous 3 times per day 03/08/14 0931     03/05/14 1000  valACYclovir (VALTREX) tablet 1,000 mg     1,000 mg Oral Daily 03/05/14 0152     03/05/14 1000  voriconazole (VFEND) tablet 200 mg     200 mg Oral Every 12 hours 03/05/14 0152         Objective: Filed Vitals:   03/08/14 1024  BP: 132/77  Pulse: 107  Temp:   Resp: 14    Intake/Output Summary (Last 24 hours) at 03/08/14 1111 Last data filed at 03/08/14 1100  Gross  per 24 hour  Intake 2577.5 ml  Output   1050 ml  Net 1527.5 ml   Filed Weights   03/05/14 0216  Weight: 54.568 kg (120 lb 4.8 oz)    Exam:   General: cachectic, no acute distress  Cardiovascular: Regular rate and rhythm normal S1-S2  Respiratory:  Diminished breath sounds bilaterally, decreased breath sounds bilaterally, scattered end expiratory wheezes, on 100% FiO2 now  Abdomen: Soft nontender nondistended  Musculoskeletal: Bilateral muscle atrophy  Data Reviewed: Basic Metabolic Panel:  Recent Labs Lab 03/24/2014 2310 03/06/14 0535 03/25/2014 0730  NA 140 138 135  K 3.9 3.9 4.1  CL 98 104 101  CO2 30 26 24   GLUCOSE 93 79 93  BUN 24* 17 15  CREATININE 1.59* 1.22 1.17  CALCIUM 8.9 8.2* 7.9*   Liver Function Tests: No results for input(s): AST, ALT, ALKPHOS, BILITOT, PROT, ALBUMIN in the last 168 hours. No results for input(s): LIPASE, AMYLASE in the last 168 hours. No results for input(s): AMMONIA in the last 168 hours. CBC:  Recent Labs Lab 03/15/2014 2310 03/06/14 0535 03/12/2014 0527 03/22/2014 0730  WBC 27.8* 25.7* SPECIMEN CONTAMINATED, UNABLE TO PERFORM TEST(S). 29.8*  NEUTROABS 21.7*  --   --   --   HGB 10.4* 8.3* SPECIMEN CONTAMINATED, UNABLE TO PERFORM TEST(S). 9.7*  HCT 30.5* 25.6* SPECIMEN CONTAMINATED, UNABLE TO PERFORM TEST(S). 28.7*  MCV 81.8 81.8 SPECIMEN CONTAMINATED, UNABLE TO PERFORM TEST(S). 82.5  PLT 502* 439* SPECIMEN CONTAMINATED, UNABLE TO PERFORM TEST(S). 440*   Cardiac Enzymes: No results for input(s): CKTOTAL, CKMB, CKMBINDEX, TROPONINI in the last 168 hours. BNP (last 3 results)  Recent Labs  03/08/14 0950  BNP 443.0*    ProBNP (last 3 results) No results for input(s): PROBNP in the last 8760 hours.  CBG: No results for input(s): GLUCAP in the last 168 hours.  Recent Results (from the past 240 hour(s))  Culture, blood (routine x 2) Call MD if unable to obtain prior to antibiotics being given     Status: None (Preliminary result)   Collection Time: 03/05/14  6:28 AM  Result Value Ref Range Status   Specimen Description BLOOD LEFT ARM  Final   Special Requests BOTTLES DRAWN AEROBIC AND ANAEROBIC 10CC EACH  Final   Culture   Final           BLOOD CULTURE RECEIVED NO GROWTH TO DATE  CULTURE WILL BE HELD FOR 5 DAYS BEFORE ISSUING A FINAL NEGATIVE REPORT Performed at [facility]    Report Status PENDING  Incomplete  Culture, blood (routine x 2) Call MD if unable to obtain prior to antibiotics being given     Status: None (Preliminary result)   Collection Time: 03/05/14  6:35 AM  Result Value Ref Range Status   Specimen Description BLOOD LEFT ARM  Final   Special Requests BOTTLES DRAWN AEROBIC AND ANAEROBIC 10CC EACH  Final   Culture   Final           BLOOD CULTURE RECEIVED NO GROWTH TO DATE CULTURE WILL BE HELD FOR 5 DAYS BEFORE ISSUING A FINAL NEGATIVE REPORT Performed at [facility]    Report Status PENDING  Incomplete  Culture, sputum-assessment     Status: None   Collection Time: 03/05/14  6:52 AM  Result Value Ref Range Status   Specimen Description SPUTUM  Final   Special Requests NONE  Final   Sputum evaluation   Final    THIS SPECIMEN IS ACCEPTABLE. RESPIRATORY CULTURE REPORT  TO FOLLOW.   Report Status 03/05/2014 FINAL  Final  Fungus Culture with Smear     Status: None (Preliminary result)   Collection Time: 03/05/14  6:52 AM  Result Value Ref Range Status   Specimen Description TRACHEAL SITE  Final   Special Requests NONE  Final   Fungal Smear   Final    NO YEAST OR FUNGAL ELEMENTS SEEN Performed at [facility]    Culture   Final    CULTURE IN PROGRESS FOR FOUR WEEKS Performed at [facility]    Report Status PENDING  Incomplete  Culture, respiratory (NON-Expectorated)     Status: None   Collection Time: 03/05/14  6:52 AM  Result Value Ref Range Status   Specimen Description SPUTUM  Final   Special Requests NONE  Final   Gram Stain   Final    NO WBC SEEN FEW SQUAMOUS EPITHELIAL CELLS PRESENT FEW GRAM POSITIVE COCCI IN PAIRS AND CHAINS IN CLUSTERS Performed at [facility]    Culture   Final    NORMAL OROPHARYNGEAL FLORA Performed at [facility]    Report Status 03/21/2014 FINAL  Final   Fungus Culture with Smear     Status: None (Preliminary result)   Collection Time: 03/18/2014 11:53 AM  Result Value Ref Range Status   Specimen Description BRONCHIAL ALVEOLAR LAVAGE  Final   Special Requests Normal  Final   Fungal Smear   Final    NO YEAST OR FUNGAL ELEMENTS SEEN Performed at [facility]    Culture   Final    CULTURE IN PROGRESS FOR FOUR WEEKS Performed at [facility]    Report Status PENDING  Incomplete  Culture, respiratory (NON-Expectorated)     Status: None (Preliminary result)   Collection Time: 03/13/2014 11:53 AM  Result Value Ref Range Status   Specimen Description BRONCHIAL ALVEOLAR LAVAGE  Final   Special Requests Normal  Final   Gram Stain PENDING  Incomplete   Culture   Final    Culture reincubated for better growth Performed at [facility]    Report Status PENDING  Incomplete  MRSA PCR Screening     Status: None   Collection Time: 03/08/14  1:15 AM  Result Value Ref Range Status   MRSA by PCR NEGATIVE NEGATIVE Final    Comment:        The GeneXpert MRSA Assay (FDA approved for NASAL specimens only), is one component of a comprehensive MRSA colonization surveillance program. It is not intended to diagnose MRSA infection nor to guide or monitor treatment for MRSA infections.      Studies: Dg Chest Port 1 View  03/08/2014   CLINICAL DATA:  Decreased O2 saturation.  Initial encounter.  EXAM: PORTABLE CHEST - 1 VIEW  COMPARISON:  Chest radiograph performed earlier today at 12:41 p.m.  FINDINGS: There is new right mid lung zone and worsening left midlung zone airspace opacity, concerning for superimposed pneumonia, though mild interstitial edema might have a similar appearance. Underlying chronic lung changes are again seen, with scarring at the left lung base and the known mass at the left lung apex.  No definite pleural effusion or pneumothorax is seen. Mild scarring is again noted at the right lung apex.  The  cardiomediastinal silhouette is borderline normal in size. No acute osseous abnormalities are identified. Cervical spinal fusion hardware is partially imaged.  IMPRESSION: 1. New right mid lung zone and worsening left mid lung zone airspace opacity,  concerning for new superimposed pneumonia, though mild interstitial edema might have a similar appearance. 2. Underlying left apical lung mass and chronic lung changes again seen.   Electronically Signed   By: Garald Balding M.D.   On: 03/08/2014 00:11   Dg Chest Port 1 View  03/13/2014   CLINICAL DATA:  Left upper lobe transbronchial biopsy, hypertension, COPD, lung cancer  EXAM: PORTABLE CHEST - 1 VIEW  COMPARISON:  03/16/2014  FINDINGS: Similar background hyperinflation with parenchymal scarring compatible with COPD/emphysema. Large left upper lobe/ hilar mass / consolidation process. No significant interval change. No effusion or pneumothorax evident. Slight tracheal deviation to the right as before.  IMPRESSION: Stable large left upper lobe/hilar mass and background COPD/ emphysema.  No superimposed enlarging effusion or pneumothorax following bronchoscopy.   Electronically Signed   By: Daryll Brod M.D.   On: 03/11/2014 12:53   Dg C-arm 1-60 Min-no Report  03/24/2014   CLINICAL DATA: left side bronchoscopy   C-ARM 1-60 MINUTES  Fluoroscopy was utilized by the requesting physician.  No radiographic  interpretation.     Scheduled Meds: . amLODipine  10 mg Oral Daily  . antiseptic oral rinse  7 mL Mouth Rinse BID  . butamben-tetracaine-benzocaine  1 spray Topical Once  . feeding supplement (ENSURE COMPLETE)  237 mL Oral TID BM  . fluticasone  2 spray Each Nare Daily  . guaiFENesin  600 mg Oral BID  . latanoprost  1 drop Both Eyes QHS  . lidocaine  1 application Topical Once  . piperacillin-tazobactam (ZOSYN)  IV  3.375 g Intravenous 3 times per day  . tamsulosin  0.4 mg Oral QPC supper  . valACYclovir  1,000 mg Oral Daily  . vancomycin  500 mg  Intravenous Q12H  . vancomycin  1,000 mg Intravenous Once  . voriconazole  200 mg Oral Q12H   Continuous Infusions: . sodium chloride 75 mL/hr (03/08/14 0827)  . sodium chloride Stopped (03/06/2014 1300)       Time spent: 30 min    [facility] Pager -  [email or web address], password [facility] 03/08/2014, 11:11 AM  LOS: 4 days

## 2014-03-08 NOTE — Progress Notes (Signed)
Critical care notified about patients current status. Ordered CXR STAT and ABG Stat. Result of CXR- ?PNA/pulm edema. New order to transfer patient to stepdown. O2 sat maintaining 87-90% at 40% venturi mask.

## 2014-03-08 NOTE — Progress Notes (Signed)
Pt has hydromorphone listed as an allergy with reaction of itching, however pt also has PRN Hydromorphone ordered and has received it several times this admission. Pt is currently c/o of 8/10 pain. Called to discuss with Dr. Jimmy Footman and will give Dilaudid as ordered since pt has been tolerating it thus far.   Idelle Crouch, RN

## 2014-03-08 NOTE — Progress Notes (Signed)
Rt gave pt flutter valve. Pt knows and understands how to use. 

## 2014-03-08 NOTE — Progress Notes (Signed)
ANTIBIOTIC CONSULT NOTE - INITIAL  Pharmacy Consult for vancomcyin/zosyn Indication: pneumonia  Allergies  Allergen Reactions  . Morphine And Related Itching    Head to toe  . Chantix [Varenicline] Other (See Comments)    Dreams, itching, suicidal ideation  . Hydromorphone Hcl Itching    Tolerates Hydrocodone-Acetaminophen  . Ibuprofen Nausea And Vomiting  . Omnipaque [Iohexol]      Code: HIVES, Desc: FACIAL HIVES S/P IV CONTRAST.Marland KitchenMarland KitchenOK W/ 25MG  BENADRYL//A.C., Onset Date: [id number]   . Shellfish Allergy Swelling    Patient Measurements: Height: 5\' 6"  (167.6 cm) Weight: 120 lb 4.8 oz (54.568 kg) IBW/kg (Calculated) : 63.8 Adjusted Body Weight:   Vital Signs: Temp: 100.4 F (38 C) (02/09 0745) Temp Source: Oral (02/09 0745) BP: 104/55 mmHg (02/09 0700) Pulse Rate: 85 (02/09 0700) Intake/Output from previous day: 02/08 0701 - 02/09 0700 In: 2197.5 [P.O.:360; I.V.:1837.5] Out: 750 [Urine:750] Intake/Output from this shift: Total I/O In: -  Out: 300 [Urine:300]  Labs:  Recent Labs  03/06/14 0535 03/12/2014 0527 03/27/2014 0730  WBC 25.7* SPECIMEN CONTAMINATED, UNABLE TO PERFORM TEST(S). 29.8*  HGB 8.3* SPECIMEN CONTAMINATED, UNABLE TO PERFORM TEST(S). 9.7*  PLT 439* SPECIMEN CONTAMINATED, UNABLE TO PERFORM TEST(S). 440*  CREATININE 1.22  --  1.17   Estimated Creatinine Clearance: 48 mL/min (by C-G formula based on Cr of 1.17). No results for input(s): VANCOTROUGH, VANCOPEAK, VANCORANDOM, GENTTROUGH, GENTPEAK, GENTRANDOM, TOBRATROUGH, TOBRAPEAK, TOBRARND, AMIKACINPEAK, AMIKACINTROU, AMIKACIN in the last 72 hours.   Microbiology: Recent Results (from the past 720 hour(s))  Culture, blood (routine x 2) Call MD if unable to obtain prior to antibiotics being given     Status: None (Preliminary result)   Collection Time: 03/05/14  6:28 AM  Result Value Ref Range Status   Specimen Description BLOOD LEFT ARM  Final   Special Requests BOTTLES DRAWN AEROBIC AND ANAEROBIC 10CC  EACH  Final   Culture   Final           BLOOD CULTURE RECEIVED NO GROWTH TO DATE CULTURE WILL BE HELD FOR 5 DAYS BEFORE ISSUING A FINAL NEGATIVE REPORT Performed at [facility]    Report Status PENDING  Incomplete  Culture, blood (routine x 2) Call MD if unable to obtain prior to antibiotics being given     Status: None (Preliminary result)   Collection Time: 03/05/14  6:35 AM  Result Value Ref Range Status   Specimen Description BLOOD LEFT ARM  Final   Special Requests BOTTLES DRAWN AEROBIC AND ANAEROBIC 10CC EACH  Final   Culture   Final           BLOOD CULTURE RECEIVED NO GROWTH TO DATE CULTURE WILL BE HELD FOR 5 DAYS BEFORE ISSUING A FINAL NEGATIVE REPORT Performed at [facility]    Report Status PENDING  Incomplete  Culture, sputum-assessment     Status: None   Collection Time: 03/05/14  6:52 AM  Result Value Ref Range Status   Specimen Description SPUTUM  Final   Special Requests NONE  Final   Sputum evaluation   Final    THIS SPECIMEN IS ACCEPTABLE. RESPIRATORY CULTURE REPORT TO FOLLOW.   Report Status 03/05/2014 FINAL  Final  Fungus Culture with Smear     Status: None (Preliminary result)   Collection Time: 03/05/14  6:52 AM  Result Value Ref Range Status   Specimen Description TRACHEAL SITE  Final   Special Requests NONE  Final   Fungal Smear   Final    NO YEAST  OR FUNGAL ELEMENTS SEEN Performed at [facility]    Culture   Final    CULTURE IN PROGRESS FOR FOUR WEEKS Performed at [facility]    Report Status PENDING  Incomplete  Culture, respiratory (NON-Expectorated)     Status: None   Collection Time: 03/05/14  6:52 AM  Result Value Ref Range Status   Specimen Description SPUTUM  Final   Special Requests NONE  Final   Gram Stain   Final    NO WBC SEEN FEW SQUAMOUS EPITHELIAL CELLS PRESENT FEW GRAM POSITIVE COCCI IN PAIRS AND CHAINS IN CLUSTERS Performed at [facility]    Culture   Final    NORMAL OROPHARYNGEAL  FLORA Performed at [facility]    Report Status 03/06/2014 FINAL  Final  MRSA PCR Screening     Status: None   Collection Time: 03/08/14  1:15 AM  Result Value Ref Range Status   MRSA by PCR NEGATIVE NEGATIVE Final    Comment:        The GeneXpert MRSA Assay (FDA approved for NASAL specimens only), is one component of a comprehensive MRSA colonization surveillance program. It is not intended to diagnose MRSA infection nor to guide or monitor treatment for MRSA infections.     Medical History: Past Medical History  Diagnosis Date  . Peripheral vascular disease     with claudication  . Pancreatitis     chronic  . Hypertension   . Glaucoma   . COPD (chronic obstructive pulmonary disease)   . Ulcer     peptic ulcer disease  . H/O alcohol abuse   . History of tobacco abuse   . PONV (postoperative nausea and vomiting)   . Anemia   . GERD (gastroesophageal reflux disease)     "NOT TO BAD NOW"  . Carpal tunnel syndrome, bilateral   . Colon polyps 12/2011  . Prostate carcinoma   . Lung cancer, upper lobe 2007  . 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 He also recently completed a 7 day course of outpt levaquin (1/21). Concerning for either worsening invasive aspergillus vs malignancy. PCCM consulted, repeat bronchoscopy done 2/8.  Orders to start vanco./zosyn 2/9 for HCAP  (PTA) 2/6 >> voriconazole >> (PTA) 2/6 >> valacyclovir >>   Tmax: 100.6  WBCs: 29.8 Renal: SCr improved to 1.17, CrCl ~ 48 ml/min  2/6 Sputum fungal: no fungal elements seen, cx in process 2/6 sputum: Nl flora 2/6 blood x2: ngtd 2/8 fungus smear: pending 2/8 BAL: pending  Goal of Therapy:  Vancomycin trough level 15-20 mcg/ml  Plan:   Vancomycin 1gm x 1 then 500mg  IV  q12h  Monitor renal function and check trough if remains on vancomycin > 48hrs  Zosyn 3.375gm IV q8h over 4h infusion  Await culture results and opportunity to narrow antibiotics  Doreene Eland, PharmD, BCPS.   Pager: [phone]  03/08/2014,8:51 AM

## 2014-03-09 ENCOUNTER — Inpatient Hospital Stay (HOSPITAL_COMMUNITY): Payer: Medicare Other

## 2014-03-09 DIAGNOSIS — N183 Chronic kidney disease, stage 3 (moderate): Secondary | ICD-10-CM

## 2014-03-09 LAB — CBC
HEMATOCRIT: 26.8 % — AB (ref 39.0–52.0)
HEMOGLOBIN: 8.7 g/dL — AB (ref 13.0–17.0)
MCH: 27.2 pg (ref 26.0–34.0)
MCHC: 32.5 g/dL (ref 30.0–36.0)
MCV: 83.8 fL (ref 78.0–100.0)
Platelets: 377 10*3/uL (ref 150–400)
RBC: 3.2 MIL/uL — AB (ref 4.22–5.81)
RDW: 15.6 % — ABNORMAL HIGH (ref 11.5–15.5)
WBC: 23.9 10*3/uL — ABNORMAL HIGH (ref 4.0–10.5)

## 2014-03-09 LAB — CULTURE, RESPIRATORY W GRAM STAIN

## 2014-03-09 LAB — CULTURE, RESPIRATORY: SPECIAL REQUESTS: NORMAL

## 2014-03-09 MED ORDER — HYDROMORPHONE HCL 2 MG/ML IJ SOLN
INTRAMUSCULAR | Status: AC
  Filled 2014-03-09: qty 1

## 2014-03-09 MED ORDER — IBUPROFEN 200 MG PO TABS: 600.0000 mg | ORAL_TABLET | ORAL | Status: DC | PRN

## 2014-03-09 MED ORDER — OXYCODONE HCL 5 MG PO TABS
10.0000 mg | ORAL_TABLET | ORAL | Status: DC | PRN
  Administered 2014-03-09 – 2014-03-10 (×4): 10 mg via ORAL
  Filled 2014-03-09 (×5): qty 2

## 2014-03-09 MED ORDER — HYDROMORPHONE HCL 1 MG/ML IJ SOLN
1.0000 mg | Freq: Once | INTRAMUSCULAR | Status: AC
  Administered 2014-03-09: 1 mg via INTRAVENOUS

## 2014-03-09 NOTE — Progress Notes (Deleted)
During hourly rounding, patient appears very sleepy. Patient easy to awake after responding to name. While talking with patient, patient begins to drift back to sleep. Challenged patient to keep eyes open and speak with visitors for 5 minutes. Will continue to monitor. If patient is unable to stay awake, will contact CCM and make aware.

## 2014-03-09 NOTE — Progress Notes (Signed)
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 On 07/29/2013 repeat chest x-ray showed progression of consolidation in the left upper lobe with suspicious areas of cavitation. A sputum culture from 08/06/2013 growing Mycobacterium. AFB cultures were negative. Patient undergoing a bronchoscopy in August 2015 with cultures negative however pathology revealed fungal hyphae. Aspergillus serology positive. He was admitted to the medicine service on 10/03/2013 at which time he was started on voriconazole. CT scan of lungs performed on 10/04/2013 showed extensive infiltrative/inflammatory process in the left lung with large cavitary process. He was discharged on IV Voriconazole, Zosyn, vancomycin that hospitalization. A repeat CT scan of chest performed on 02/25/2014 showed increased size and bulky peripheral soft tissue component of masslike opacity in the left upper lobe with involvement of the left ilium and mediastinum. Also noted was new bone destruction of T5 vertebral body consistent with osseous involvement. Patient admitted to the medicine service on 03/05/2014 presenting with complaints of hemoptysis. Patient was evaluated by pulmonary/critical care medicine recommending continuing current antimicrobial therapy along with bronchoscopy.  Subjective: Continues to mostly ignore me. When I tell him I am his doctor her tells me that he can't tell one from the other. C/o pain in his chest and asking for his pain medication.   Assessment/Plan: Left upper lobe cavitary lesion- h/o lung CA as mentioned above -Patient found to have  changes to left upper lobe from a chest x-ray in July 2015, which has progressed to cavitary process. -In the month of September he was treated with IV vancomycin, Zosyn and voriconazole -on voriconazole 200 mg by mouth twice a day -s/p bronch- biopsy reveals malignant cells- pulm team has contacted Oncology  [facility]-  Leukocytosis - Dr Lake Bells suspecting pneumonia as a result of aspiration during bronch and started patient on Vanc and Zosyn on 2/9 - wean O2 as able- in SDU now   Hemoptysis -Likely secondary to left upper lobe cavitary lesion/ cancer -Repeat chest x-ray performed showing stable left large apical lung mass  Chronic obstructive pulmonary disease -Stable   Code Status: Full code- Dr Lake Bells discussed code status - daughter wants him to be a full code Family Communication:  Disposition Plan: plan per pulmonary- will d/c home when given clearance by them   Consultants:  Pulmonary critical care medicine   Antibiotics: Anti-infectives    Start     Dose/Rate Route Frequency Ordered Stop   03/08/14 2200  vancomycin (VANCOCIN) 500 mg in sodium chloride 0.9 % 100 mL IVPB     500 mg 100 mL/hr over 60 Minutes Intravenous Every 12 hours 03/08/14 0931     03/08/14 1100  vancomycin (VANCOCIN) IVPB 1000 mg/200 mL premix     1,000 mg 200 mL/hr over 60 Minutes Intravenous  Once 03/08/14 0931 03/08/14 1155   03/08/14 1100  piperacillin-tazobactam (ZOSYN) IVPB 3.375 g     3.375 g 12.5 mL/hr over 240 Minutes Intravenous 3 times per day 03/08/14 0931     03/05/14 1000  valACYclovir (VALTREX) tablet 1,000 mg     1,000 mg Oral Daily 03/05/14 0152     03/05/14 1000  voriconazole (VFEND) tablet 200 mg     200 mg Oral Every  12 hours 03/05/14 0152        Objective: Filed Vitals:   03/09/14 1400  BP: 121/61  Pulse: 103  Temp:   Resp: 23    Intake/Output Summary (Last 24 hours) at 03/09/14 1707 Last data filed at 03/09/14 1500  Gross per 24 hour  Intake   2000 ml  Output     850 ml  Net   1150 ml   Filed Weights   03/05/14 0216  Weight: 54.568 kg (120 lb 4.8 oz)    Exam:   General: cachectic, no acute distress  Cardiovascular: Regular rate and rhythm normal S1-S2  Respiratory: Diminished breath sounds bilaterally, decreased breath sounds bilaterally, scattered end expiratory wheezes, on 100% FiO2   Abdomen: Soft nontender nondistended  Musculoskeletal: Bilateral muscle atrophy  Data Reviewed: Basic Metabolic Panel:  Recent Labs Lab 03/17/2014 2310 03/06/14 0535 02/28/2014 0730  NA 140 138 135  K 3.9 3.9 4.1  CL 98 104 101  CO2 30 26 24   GLUCOSE 93 79 93  BUN 24* 17 15  CREATININE 1.59* 1.22 1.17  CALCIUM 8.9 8.2* 7.9*   Liver Function Tests: No results for input(s): AST, ALT, ALKPHOS, BILITOT, PROT, ALBUMIN in the last 168 hours. No results for input(s): LIPASE, AMYLASE in the last 168 hours. No results for input(s): AMMONIA in the last 168 hours. CBC:  Recent Labs Lab 03/05/2014 2310 03/06/14 0535 03/23/2014 0527 03/21/2014 0730 03/09/14 0359  WBC 27.8* 25.7* SPECIMEN CONTAMINATED, UNABLE TO PERFORM TEST(S). 29.8* 23.9*  NEUTROABS 21.7*  --   --   --   --   HGB 10.4* 8.3* SPECIMEN CONTAMINATED, UNABLE TO PERFORM TEST(S). 9.7* 8.7*  HCT 30.5* 25.6* SPECIMEN CONTAMINATED, UNABLE TO PERFORM TEST(S). 28.7* 26.8*  MCV 81.8 81.8 SPECIMEN CONTAMINATED, UNABLE TO PERFORM TEST(S). 82.5 83.8  PLT 502* 439* SPECIMEN CONTAMINATED, UNABLE TO PERFORM TEST(S). 440* 377   Cardiac Enzymes: No results for input(s): CKTOTAL, CKMB, CKMBINDEX, TROPONINI in the last 168 hours. BNP (last 3 results)  Recent Labs  03/08/14 0950  BNP 443.0*    ProBNP (last 3 results) No results for input(s): PROBNP in the last 8760 hours.  CBG: No results for input(s): GLUCAP in the last 168 hours.  Recent Results (from the past 240 hour(s))  Culture, blood (routine x 2) Call MD if unable to obtain prior to antibiotics being given     Status: None (Preliminary  result)   Collection Time: 03/05/14  6:28 AM  Result Value Ref Range Status   Specimen Description BLOOD LEFT ARM  Final   Special Requests BOTTLES DRAWN AEROBIC AND ANAEROBIC 10CC EACH  Final   Culture   Final           BLOOD CULTURE RECEIVED NO GROWTH TO DATE CULTURE WILL BE HELD FOR 5 DAYS BEFORE ISSUING A FINAL NEGATIVE REPORT Performed at [facility]    Report Status PENDING  Incomplete  Culture, blood (routine x 2) Call MD if unable to obtain prior to antibiotics being given     Status: None (Preliminary result)   Collection Time: 03/05/14  6:35 AM  Result Value Ref Range Status   Specimen Description BLOOD LEFT ARM  Final   Special Requests BOTTLES DRAWN AEROBIC AND ANAEROBIC 10CC EACH  Final   Culture   Final           BLOOD CULTURE RECEIVED NO GROWTH TO DATE CULTURE WILL BE HELD FOR 5 DAYS BEFORE ISSUING A FINAL NEGATIVE REPORT Performed at [facility]    Report Status PENDING  Incomplete  Culture, sputum-assessment     Status: None   Collection Time: 03/05/14  6:52 AM  Result Value Ref Range Status   Specimen Description SPUTUM  Final   Special Requests NONE  Final   Sputum evaluation   Final    THIS SPECIMEN IS ACCEPTABLE. RESPIRATORY CULTURE REPORT TO FOLLOW.   Report Status 03/05/2014 FINAL  Final  Fungus Culture with Smear     Status: None (Preliminary result)   Collection Time: 03/05/14  6:52 AM  Result Value Ref Range Status   Specimen Description TRACHEAL SITE  Final   Special Requests NONE  Final   Fungal Smear   Final    NO YEAST OR FUNGAL ELEMENTS SEEN Performed at [facility]    Culture   Final    CULTURE IN PROGRESS FOR FOUR WEEKS Performed at [facility]    Report Status PENDING  Incomplete  Culture, respiratory (NON-Expectorated)     Status: None   Collection Time: 03/05/14  6:52 AM  Result Value Ref Range Status   Specimen Description SPUTUM  Final   Special Requests NONE  Final   Gram Stain   Final    NO WBC  SEEN FEW SQUAMOUS EPITHELIAL CELLS PRESENT FEW GRAM POSITIVE COCCI IN PAIRS AND CHAINS IN CLUSTERS Performed at [facility]    Culture   Final    NORMAL OROPHARYNGEAL FLORA Performed at [facility]    Report Status 03/24/2014 FINAL  Final  Fungus Culture with Smear     Status: None (Preliminary result)   Collection Time: 02/28/2014 11:53 AM  Result Value Ref Range Status   Specimen Description BRONCHIAL ALVEOLAR LAVAGE  Final   Special Requests Normal  Final   Fungal Smear   Final    NO YEAST OR FUNGAL ELEMENTS SEEN Performed at [facility]    Culture   Final    CULTURE IN PROGRESS FOR FOUR WEEKS Performed at [facility]    Report Status PENDING  Incomplete  Culture, respiratory (NON-Expectorated)     Status: None   Collection Time: 03/09/2014 11:53 AM  Result Value Ref Range Status   Specimen Description BRONCHIAL ALVEOLAR LAVAGE  Final   Special Requests Normal  Final   Gram Stain   Final    ABUNDANT WBC PRESENT, PREDOMINANTLY PMN NO SQUAMOUS EPITHELIAL CELLS SEEN NO ORGANISMS SEEN Performed at [facility]    Culture   Final    Non-Pathogenic Oropharyngeal-type Flora Isolated. Performed at [facility]    Report Status 03/09/2014 FINAL  Final  MRSA PCR Screening     Status: None   Collection Time: 03/08/14  1:15 AM  Result Value Ref Range Status   MRSA by PCR NEGATIVE NEGATIVE Final    Comment:        The GeneXpert MRSA Assay (FDA approved for NASAL specimens only), is one component of a comprehensive MRSA colonization surveillance program. It is not intended to diagnose MRSA infection nor to guide or monitor treatment for MRSA infections.   Culture, blood (routine x 2)     Status: None (Preliminary result)   Collection Time: 03/08/14  8:45 AM  Result Value Ref Range Status   Specimen Description BLOOD RIGHT HAND  Final   Special Requests BOTTLES DRAWN AEROBIC AND ANAEROBIC 3CC  Final   Culture   Final            BLOOD  CULTURE RECEIVED NO GROWTH TO DATE CULTURE WILL BE HELD FOR 5 DAYS BEFORE ISSUING A FINAL NEGATIVE REPORT Performed at [facility]    Report Status PENDING  Incomplete  Culture, blood (routine x 2)     Status: None (Preliminary result)   Collection Time: 03/08/14  8:50 AM  Result Value Ref Range Status   Specimen Description BLOOD RIGHT ARM  Final   Special Requests BOTTLES DRAWN AEROBIC AND ANAEROBIC 3CC  Final   Culture   Final           BLOOD CULTURE RECEIVED NO GROWTH TO DATE CULTURE WILL BE HELD FOR 5 DAYS BEFORE ISSUING A FINAL NEGATIVE REPORT Performed at [facility]    Report Status PENDING  Incomplete     Studies: Dg Chest Port 1 View  03/09/2014   CLINICAL DATA:  Respiratory failure, shortness of breath ; 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  EXAM: PORTABLE CHEST - 1 VIEW  COMPARISON:  Portable chest x-ray of March 07, 2014  FINDINGS: The right lung remains well-expanded. There is stable right apical pleural thickening and stable increased interstitial density in the lung. On the left there is increasing density in the upper 1/2 of the hemi thorax with mild displacement of the trachea toward the right. There remains a small amount of aerated right lower lung. There is partial obscuration of the hemidiaphragm. The cardiac silhouette is normal in size. The pulmonary vascularity is not engorged. The observed bony thorax is unremarkable.  IMPRESSION: 1. Increasing opacification of the right mid and upper hemi thorax with evidence of mild displacement of the trachea toward the right. This is consistent with progressive atelectasis and/or pneumonia. There is a known left upper lobe mass. 2. Stable mildly increased interstitial density in the right lung.   Electronically Signed   By: David  Martinique   On: 03/09/2014 07:41   Dg Chest Port 1 View  03/08/2014   CLINICAL DATA:  Decreased O2 saturation.  Initial encounter.  EXAM: PORTABLE  CHEST - 1 VIEW  COMPARISON:  Chest radiograph performed earlier today at 12:41 p.m.  FINDINGS: There is new right mid lung zone and worsening left midlung zone airspace opacity, concerning for superimposed pneumonia, though mild interstitial edema might have a similar appearance. Underlying chronic lung changes are again seen, with scarring at the left lung base and the known mass at the left lung apex.  No definite pleural effusion or pneumothorax is seen. Mild scarring is again noted at the right lung apex.  The cardiomediastinal silhouette is borderline normal in size. No acute osseous abnormalities are identified. Cervical spinal fusion hardware is partially imaged.  IMPRESSION: 1. New right mid lung zone and worsening left mid lung zone airspace opacity, concerning for new superimposed pneumonia, though mild interstitial edema might have a similar appearance. 2. Underlying left apical lung mass and chronic lung changes again seen.   Electronically Signed   By: Garald Balding M.D.   On: 03/08/2014 00:11    Scheduled Meds: . amLODipine  10 mg Oral Daily  . antiseptic oral rinse  7 mL Mouth Rinse BID  . butamben-tetracaine-benzocaine  1 spray Topical Once  . feeding supplement (ENSURE COMPLETE)  237 mL Oral TID BM  . fluticasone  2 spray Each Nare Daily  . guaiFENesin  600 mg Oral BID  . latanoprost  1 drop Both Eyes QHS  . lidocaine  1 application Topical Once  . piperacillin-tazobactam (ZOSYN)  IV  3.375 g Intravenous 3 times per day  . tamsulosin  0.4 mg Oral QPC supper  . valACYclovir  1,000 mg Oral Daily  . vancomycin  500 mg Intravenous Q12H  . voriconazole  200 mg Oral Q12H   Continuous Infusions: . sodium chloride 75 mL/hr at 03/09/14 0012  . sodium chloride Stopped (03/06/2014 1300)       Time spent: 30 min    [facility] Pager -  [email or web address], password [email or web address] 03/09/2014, 5:07 PM  LOS: 4 days

## 2014-03-09 NOTE — Progress Notes (Signed)
Central tele called to state they witnessed non-sustained SVT with heart-rate in 170's. Checked alarms and rhythm with secretary and was unable to capture/view rhythm. Patient asymptomatic, HR 84, RR 13, BP 92/52, and 02 93% on NRB. Will continue to monitor.

## 2014-03-09 NOTE — Progress Notes (Signed)
Patient continues to be uncomfortable and 'miserable.' Text-paged MD Rizwan to make aware patient's concerns of pain medication not working. HR 100's, O2 saturdation 90% on NRB. No new orders placed. Will continue to monitor and intervene to make comfortable.

## 2014-03-09 NOTE — Progress Notes (Signed)
PULMONARY / CRITICAL CARE MEDICINE   Name: [name] [name] MRN: [id number] DOB: [birth year]    ADMISSION DATE:  03/03/2014 CONSULTATION DATE:  03/05/14  REFERRING MD :  [name] [name]  CHIEF COMPLAINT:  hemoptysis  INITIAL PRESENTATION: hemoptysis, LUL cavitary lesion with mediastinal invasion  STUDIES:  CXR: LUL cavitary lseion and mediastinal invasion  SIGNIFICANT EVENTS:   2/9 Bronchoscopy> mucus plugging LUL, mass left upper lobe, biopsy, brushing, washing   SUBJECTIVE:   VITAL SIGNS: Temp:  [98.4 F (36.9 C)-100.6 F (38.1 C)] 99.7 F (37.6 C) (02/10 0800) Pulse Rate:  [76-117] 93 (02/10 1005) Resp:  [10-21] 17 (02/10 1005) BP: (81-126)/(47-70) 126/61 mmHg (02/10 1005) SpO2:  [73 %-96 %] 93 % (02/10 1005) HEMODYNAMICS:   VENTILATOR SETTINGS:   INTAKE / OUTPUT:  Intake/Output Summary (Last 24 hours) at 03/09/14 1049 Last data filed at 03/09/14 0800  Gross per 24 hour  Intake   2520 ml  Output    925 ml  Net   1595 ml    PHYSICAL EXAMINATION: General:  Thin, cachectic, breathing comfortably HEENT: NCAT EOMi PULM : more diminished left upper lobe, bilateral crackles CV: RRR, no mgr AB: BS+, soft, nontender Ext: warm, no edema Neuro: A&Ox4, maew  LABS:  CBC  Recent Labs Lab 03/27/2014 0527 03/03/2014 0730 03/09/14 0359  WBC SPECIMEN CONTAMINATED, UNABLE TO PERFORM TEST(S). 29.8* 23.9*  HGB SPECIMEN CONTAMINATED, UNABLE TO PERFORM TEST(S). 9.7* 8.7*  HCT SPECIMEN CONTAMINATED, UNABLE TO PERFORM TEST(S). 28.7* 26.8*  PLT SPECIMEN CONTAMINATED, UNABLE TO PERFORM TEST(S). 440* 377   Coag's  Recent Labs Lab 03/05/14 0628  APTT 54*  INR 1.17   BMET  Recent Labs Lab 03/01/2014 2310 03/06/14 0535 03/09/2014 0730  NA 140 138 135  K 3.9 3.9 4.1  CL 98 104 101  CO2 30 26 24   BUN 24* 17 15  CREATININE 1.59* 1.22 1.17  GLUCOSE 93 79 93   Electrolytes  Recent Labs Lab 03/23/2014 2310 03/06/14 0535 03/03/2014 0730  CALCIUM 8.9 8.2* 7.9*   Sepsis  Markers No results for input(s): LATICACIDVEN, PROCALCITON, O2SATVEN in the last 168 hours. ABG  Recent Labs Lab 03/08/14 0017  PHART 7.389  PCO2ART 39.2  PO2ART 63.2*   Liver Enzymes No results for input(s): AST, ALT, ALKPHOS, BILITOT, ALBUMIN in the last 168 hours. Cardiac Enzymes No results for input(s): TROPONINI, PROBNP in the last 168 hours. Glucose No results for input(s): GLUCAP in the last 168 hours.  Imaging No results found. Worsening RUL/RML atx, mild but stable right sided airspace disease  ASSESSMENT / PLAN:  Large left upper lobe mass, prior lung cancer and known fungal disease> s/p bronchoscopy 2/8 with + malignant cells from cytology  Acute on chronic hypoxemic respiratory failure > in setting of probable post obstructive PNA COPD Known pulmonary fungal disease, treated as aspergillus with voriconazole P:   F/u bronchoscopy results Heme/onc called Treat for HCAP, see ID Flutter valve Guaifenesin Titrate O2 to O2 saturation > 88% Poor candidate for intubation/repeat FOB   HTN P:  Monitor Tele  CKD St 3 stable P:   Avoid nephrotoxic meds, monitor electrolytes  Chronic anemia P:  monitor  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  S/P bronchoscopy on 2/8, then developed HCAP vs aspiration vs simply post obstructive process 2/9 early AM.  Treat as HCAP, f/u bronch and cultures. Add low dose motrin to assist w/ pain. Heme/onc called.  [name] [name] ACNP-BC [place] Pager # [phone] OR # [phone] if no answer      03/09/2014, 10:49 AM    Attending:  I have seen and examined the patient with nurse  practitioner/resident and agree with the note above.   Today his respiratory status appears slightly worse.  His CXR is worse with a worsening left lung infiltrate.  His respiratory exam has not changed otherwise today.  I had a lengthy conversation with his daughter today about the biopsy showing high grade poorly differentiated malignancy.  She was quite upset considering the long duration of this illness.  Further, I explained that he is currently critically ill and is high risk for death from this illness. Should he required life support, I explained it would be difficult to get him off the vent.  He defers all decision making on this illness to his daughter, though I recommended they discuss this together.  At this point he remains full code.  Will continue to treat with antibiotics, chest PT, as we are doing.  My cc time 35 minutes  [name] Awkward, MD [facility] Pager: [phone] Cell: [phone] If no response, call [phone]

## 2014-03-09 NOTE — Progress Notes (Signed)
Made the mention to MD Rizwan that patient appears to be uncomfortable and patient states q2h PRN Dilaudid is not managing his pain. MD Rizwan stated to give OxyCodone with the Dilaudid until MD Lake Bells sees patient. Stated it is schedule q6H and she advised to change it to q4H. Continuing to keep patient as comfortable as able and maintaining vital signs. O2 currently 92% on NRB.

## 2014-03-10 ENCOUNTER — Inpatient Hospital Stay (HOSPITAL_COMMUNITY): Payer: Medicare Other

## 2014-03-10 DIAGNOSIS — J9600 Acute respiratory failure, unspecified whether with hypoxia or hypercapnia: Secondary | ICD-10-CM

## 2014-03-10 DIAGNOSIS — R222 Localized swelling, mass and lump, trunk: Secondary | ICD-10-CM

## 2014-03-10 DIAGNOSIS — I471 Supraventricular tachycardia: Secondary | ICD-10-CM | POA: Insufficient documentation

## 2014-03-10 DIAGNOSIS — C3410 Malignant neoplasm of upper lobe, unspecified bronchus or lung: Secondary | ICD-10-CM

## 2014-03-10 DIAGNOSIS — J9601 Acute respiratory failure with hypoxia: Secondary | ICD-10-CM | POA: Insufficient documentation

## 2014-03-10 DIAGNOSIS — I4891 Unspecified atrial fibrillation: Secondary | ICD-10-CM | POA: Insufficient documentation

## 2014-03-10 LAB — BLOOD GAS, ARTERIAL
Acid-base deficit: 5.4 mmol/L — ABNORMAL HIGH (ref 0.0–2.0)
Bicarbonate: 21 mEq/L (ref 20.0–24.0)
DRAWN BY: 295031
FIO2: 1 %
LHR: 14 {breaths}/min
MECHVT: 500 mL
O2 Saturation: 92.5 %
PATIENT TEMPERATURE: 101
PCO2 ART: 52.1 mmHg — AB (ref 35.0–45.0)
PEEP: 8 cmH2O
PH ART: 7.238 — AB (ref 7.350–7.450)
TCO2: 20.4 mmol/L (ref 0–100)
pO2, Arterial: 82.4 mmHg (ref 80.0–100.0)

## 2014-03-10 LAB — CBC
HCT: 27.2 % — ABNORMAL LOW (ref 39.0–52.0)
HEMATOCRIT: 28 % — AB (ref 39.0–52.0)
HEMOGLOBIN: 8.8 g/dL — AB (ref 13.0–17.0)
Hemoglobin: 8.8 g/dL — ABNORMAL LOW (ref 13.0–17.0)
MCH: 27.5 pg (ref 26.0–34.0)
MCH: 26.7 pg (ref 26.0–34.0)
MCHC: 32.4 g/dL (ref 30.0–36.0)
MCHC: 31.4 g/dL (ref 30.0–36.0)
MCV: 85 fL (ref 78.0–100.0)
MCV: 85.1 fL (ref 78.0–100.0)
PLATELETS: 410 10*3/uL — AB (ref 150–400)
Platelets: 437 10*3/uL — ABNORMAL HIGH (ref 150–400)
RBC: 3.2 MIL/uL — AB (ref 4.22–5.81)
RBC: 3.29 MIL/uL — ABNORMAL LOW (ref 4.22–5.81)
RDW: 15.8 % — ABNORMAL HIGH (ref 11.5–15.5)
RDW: 16.1 % — AB (ref 11.5–15.5)
WBC: 32.2 10*3/uL — ABNORMAL HIGH (ref 4.0–10.5)
WBC: 32.7 10*3/uL — ABNORMAL HIGH (ref 4.0–10.5)

## 2014-03-10 LAB — GLUCOSE, CAPILLARY
GLUCOSE-CAPILLARY: 158 mg/dL — AB (ref 70–99)
GLUCOSE-CAPILLARY: 124 mg/dL — AB (ref 70–99)
GLUCOSE-CAPILLARY: 120 mg/dL — AB (ref 70–99)

## 2014-03-10 LAB — BASIC METABOLIC PANEL
Anion gap: 11 (ref 5–15)
BUN: 17 mg/dL (ref 6–23)
CO2: 22 mmol/L (ref 19–32)
CREATININE: 1.46 mg/dL — AB (ref 0.50–1.35)
Calcium: 8.2 mg/dL — ABNORMAL LOW (ref 8.4–10.5)
Chloride: 109 mmol/L (ref 96–112)
GFR calc Af Amer: 56 mL/min — ABNORMAL LOW (ref >90)
GFR, EST NON AFRICAN AMERICAN: 48 mL/min — AB (ref >90)
GLUCOSE: 167 mg/dL — AB (ref 70–99)
Potassium: 4 mmol/L (ref 3.5–5.1)
SODIUM: 142 mmol/L (ref 135–145)

## 2014-03-10 MED ORDER — VALACYCLOVIR HCL 500 MG PO TABS
1000.0000 mg | ORAL_TABLET | Freq: Every day | ORAL | Status: DC
  Administered 2014-03-11 – 2014-03-18 (×8): 1000 mg
  Filled 2014-03-10 (×8): qty 2

## 2014-03-10 MED ORDER — CETYLPYRIDINIUM CHLORIDE 0.05 % MT LIQD
7.0000 mL | Freq: Four times a day (QID) | OROMUCOSAL | Status: DC
  Administered 2014-03-10: 7 mL via OROMUCOSAL

## 2014-03-10 MED ORDER — FENTANYL CITRATE 0.05 MG/ML IJ SOLN
INTRAMUSCULAR | Status: AC
  Filled 2014-03-10: qty 2

## 2014-03-10 MED ORDER — ONDANSETRON 8 MG/NS 50 ML IVPB
8.0000 mg | Freq: Once | INTRAVENOUS | Status: AC
  Administered 2014-03-10: 8 mg via INTRAVENOUS
  Filled 2014-03-10: qty 8

## 2014-03-10 MED ORDER — FENTANYL BOLUS VIA INFUSION
25.0000 ug | INTRAVENOUS | Status: DC | PRN
  Administered 2014-03-12 – 2014-03-19 (×5): 25 ug via INTRAVENOUS
  Filled 2014-03-10 (×2): qty 25

## 2014-03-10 MED ORDER — SODIUM CHLORIDE 0.9 % IV SOLN
25.0000 ug/h | INTRAVENOUS | Status: DC
  Administered 2014-03-10 (×3): 50 ug/h via INTRAVENOUS
  Administered 2014-03-10: 400 ug/h via INTRAVENOUS
  Administered 2014-03-11: 250 ug/h via INTRAVENOUS
  Administered 2014-03-11 (×2): 400 ug/h via INTRAVENOUS
  Administered 2014-03-12: 200 ug/h via INTRAVENOUS
  Administered 2014-03-12: 400 ug/h via INTRAVENOUS
  Administered 2014-03-12: 250 ug/h via INTRAVENOUS
  Administered 2014-03-13 – 2014-03-14 (×2): 200 ug/h via INTRAVENOUS
  Administered 2014-03-15: 250 ug/h via INTRAVENOUS
  Administered 2014-03-15: 200 ug/h via INTRAVENOUS
  Administered 2014-03-16: 300 ug/h via INTRAVENOUS
  Administered 2014-03-16: 250 ug/h via INTRAVENOUS
  Administered 2014-03-16: 50 ug/h via INTRAVENOUS
  Administered 2014-03-17 (×3): 350 ug/h via INTRAVENOUS
  Administered 2014-03-18 (×2): 375 ug/h via INTRAVENOUS
  Administered 2014-03-18: 400 ug/h via INTRAVENOUS
  Administered 2014-03-19: 300 ug/h via INTRAVENOUS
  Filled 2014-03-10 (×26): qty 50

## 2014-03-10 MED ORDER — MIDAZOLAM HCL 2 MG/2ML IJ SOLN
1.0000 mg | INTRAMUSCULAR | Status: DC | PRN
  Administered 2014-03-11 – 2014-03-19 (×27): 1 mg via INTRAVENOUS
  Filled 2014-03-10 (×24): qty 2

## 2014-03-10 MED ORDER — DILTIAZEM HCL 100 MG IV SOLR
5.0000 mg/h | INTRAVENOUS | Status: DC
  Administered 2014-03-10: 5 mg/h via INTRAVENOUS
  Filled 2014-03-10: qty 100

## 2014-03-10 MED ORDER — MIDAZOLAM HCL 2 MG/2ML IJ SOLN
INTRAMUSCULAR | Status: AC
  Filled 2014-03-10: qty 2

## 2014-03-10 MED ORDER — AMIODARONE LOAD VIA INFUSION
150.0000 mg | Freq: Once | INTRAVENOUS | Status: AC
  Administered 2014-03-10: 150 mg via INTRAVENOUS
  Filled 2014-03-10: qty 83.34

## 2014-03-10 MED ORDER — FENTANYL 50 MCG/HR TD PT72
50.0000 ug | MEDICATED_PATCH | TRANSDERMAL | Status: DC
Start: 2014-03-10 — End: 2014-03-10
  Administered 2014-03-10: 50 ug via TRANSDERMAL
  Filled 2014-03-10: qty 1

## 2014-03-10 MED ORDER — MIDAZOLAM HCL 2 MG/2ML IJ SOLN
1.0000 mg | INTRAMUSCULAR | Status: AC | PRN
  Administered 2014-03-10 – 2014-03-12 (×3): 1 mg via INTRAVENOUS
  Filled 2014-03-10 (×6): qty 2

## 2014-03-10 MED ORDER — CHLORHEXIDINE GLUCONATE 0.12 % MT SOLN
15.0000 mL | Freq: Two times a day (BID) | OROMUCOSAL | Status: DC
Start: 2014-03-10 — End: 2014-03-19
  Administered 2014-03-10 – 2014-03-19 (×18): 15 mL via OROMUCOSAL
  Filled 2014-03-10 (×16): qty 15

## 2014-03-10 MED ORDER — VITAL HIGH PROTEIN PO LIQD
1000.0000 mL | ORAL | Status: DC
  Administered 2014-03-10: 1000 mL
  Filled 2014-03-10 (×2): qty 1000

## 2014-03-10 MED ORDER — MIDAZOLAM HCL 2 MG/2ML IJ SOLN
INTRAMUSCULAR | Status: AC
Start: 2014-03-10 — End: 2014-03-10
  Administered 2014-03-10: 13:00:00
  Filled 2014-03-10: qty 2

## 2014-03-10 MED ORDER — AMIODARONE HCL IN DEXTROSE 360-4.14 MG/200ML-% IV SOLN
30.0000 mg/h | INTRAVENOUS | Status: DC
  Administered 2014-03-11 – 2014-03-12 (×4): 30 mg/h via INTRAVENOUS
  Filled 2014-03-10 (×5): qty 200

## 2014-03-10 MED ORDER — CHLORHEXIDINE GLUCONATE 0.12 % MT SOLN
15.0000 mL | Freq: Two times a day (BID) | OROMUCOSAL | Status: DC
  Administered 2014-03-10: 15 mL via OROMUCOSAL

## 2014-03-10 MED ORDER — PANTOPRAZOLE SODIUM 40 MG IV SOLR
40.0000 mg | Freq: Two times a day (BID) | INTRAVENOUS | Status: DC
Start: 2014-03-10 — End: 2014-03-19
  Administered 2014-03-10 – 2014-03-19 (×19): 40 mg via INTRAVENOUS
  Filled 2014-03-10 (×19): qty 40

## 2014-03-10 MED ORDER — PHENYLEPHRINE HCL 10 MG/ML IJ SOLN
30.0000 ug/min | INTRAMUSCULAR | Status: DC
  Administered 2014-03-10: 30 ug/min via INTRAVENOUS
  Administered 2014-03-11: 50 ug/min via INTRAVENOUS
  Administered 2014-03-11: 30 ug/min via INTRAVENOUS
  Administered 2014-03-11: 40 ug/min via INTRAVENOUS
  Administered 2014-03-12: 30 ug/min via INTRAVENOUS
  Administered 2014-03-12: 35 ug/min via INTRAVENOUS
  Filled 2014-03-10 (×8): qty 1

## 2014-03-10 MED ORDER — VORICONAZOLE 200 MG PO TABS
200.0000 mg | ORAL_TABLET | Freq: Two times a day (BID) | ORAL | Status: DC
  Administered 2014-03-10 – 2014-03-18 (×17): 200 mg
  Filled 2014-03-10 (×19): qty 1

## 2014-03-10 MED ORDER — ETOMIDATE 2 MG/ML IV SOLN
INTRAVENOUS | Status: AC
Start: 2014-03-10 — End: 2014-03-10
  Administered 2014-03-10: 20 mg
  Filled 2014-03-10: qty 20

## 2014-03-10 MED ORDER — FENTANYL CITRATE 0.05 MG/ML IJ SOLN
50.0000 ug | Freq: Once | INTRAMUSCULAR | Status: AC
  Administered 2014-03-10: 50 ug via INTRAVENOUS

## 2014-03-10 MED ORDER — SODIUM CHLORIDE 0.9 % IV BOLUS (SEPSIS)
500.0000 mL | Freq: Once | INTRAVENOUS | Status: AC
  Administered 2014-03-10: 500 mL via INTRAVENOUS

## 2014-03-10 MED ORDER — HYDROMORPHONE HCL 2 MG/ML IJ SOLN
2.0000 mg | INTRAMUSCULAR | Status: DC | PRN
  Administered 2014-03-10: 2 mg via INTRAVENOUS
  Filled 2014-03-10: qty 1

## 2014-03-10 MED ORDER — AMIODARONE HCL IN DEXTROSE 360-4.14 MG/200ML-% IV SOLN
60.0000 mg/h | INTRAVENOUS | Status: AC
  Administered 2014-03-10 (×2): 60 mg/h via INTRAVENOUS
  Filled 2014-03-10: qty 200

## 2014-03-10 MED ORDER — PROMETHAZINE HCL 25 MG/ML IJ SOLN: 12.5000 mg | Freq: Four times a day (QID) | INTRAMUSCULAR | Status: DC | PRN

## 2014-03-10 MED ORDER — DILTIAZEM LOAD VIA INFUSION
15.0000 mg | Freq: Once | INTRAVENOUS | Status: AC
  Administered 2014-03-10: 15 mg via INTRAVENOUS
  Filled 2014-03-10: qty 15

## 2014-03-10 MED ORDER — SUCCINYLCHOLINE CHLORIDE 20 MG/ML IJ SOLN
INTRAMUSCULAR | Status: AC
  Administered 2014-03-10: 13:00:00
  Filled 2014-03-10: qty 1

## 2014-03-10 MED ORDER — PANTOPRAZOLE SODIUM 40 MG PO TBEC
40.0000 mg | DELAYED_RELEASE_TABLET | Freq: Every day | ORAL | Status: DC
  Administered 2014-03-10: 40 mg via ORAL
  Filled 2014-03-10 (×2): qty 1

## 2014-03-10 MED ORDER — ACETYLCYSTEINE 20 % IN SOLN
3.0000 mL | Freq: Two times a day (BID) | RESPIRATORY_TRACT | Status: AC
  Administered 2014-03-10 – 2014-03-11 (×3): 3 mL via RESPIRATORY_TRACT
  Filled 2014-03-10 (×3): qty 4

## 2014-03-10 MED ORDER — FUROSEMIDE 10 MG/ML IJ SOLN
40.0000 mg | Freq: Four times a day (QID) | INTRAMUSCULAR | Status: DC
  Administered 2014-03-10: 40 mg via INTRAVENOUS
  Filled 2014-03-10: qty 4

## 2014-03-10 MED ORDER — ONDANSETRON HCL 4 MG/2ML IJ SOLN
4.0000 mg | Freq: Four times a day (QID) | INTRAMUSCULAR | Status: DC | PRN
  Administered 2014-03-10: 4 mg via INTRAVENOUS
  Filled 2014-03-10: qty 2

## 2014-03-10 MED ORDER — CETYLPYRIDINIUM CHLORIDE 0.05 % MT LIQD
7.0000 mL | Freq: Four times a day (QID) | OROMUCOSAL | Status: DC
  Administered 2014-03-11 – 2014-03-19 (×32): 7 mL via OROMUCOSAL

## 2014-03-10 MED ORDER — FENTANYL CITRATE 0.05 MG/ML IJ SOLN
INTRAMUSCULAR | Status: AC
  Administered 2014-03-10: 13:00:00
  Filled 2014-03-10: qty 2

## 2014-03-10 MED ORDER — FENTANYL CITRATE 0.05 MG/ML IJ SOLN
100.0000 ug | Freq: Once | INTRAMUSCULAR | Status: AC
  Administered 2014-03-10: 100 ug via INTRAVENOUS

## 2014-03-10 MED ORDER — CHLORHEXIDINE GLUCONATE 0.12 % MT SOLN
OROMUCOSAL | Status: AC
  Administered 2014-03-10: 15:00:00
  Filled 2014-03-10: qty 15

## 2014-03-10 MED ORDER — ROCURONIUM BROMIDE 50 MG/5ML IV SOLN
INTRAVENOUS | Status: AC
  Administered 2014-03-10: 10 mg
  Filled 2014-03-10: qty 2

## 2014-03-10 MED ORDER — LIDOCAINE HCL (CARDIAC) 20 MG/ML IV SOLN
INTRAVENOUS | Status: AC
  Administered 2014-03-10: 13:00:00
  Filled 2014-03-10: qty 5

## 2014-03-10 MED ORDER — GI COCKTAIL ~~LOC~~
30.0000 mL | Freq: Once | ORAL | Status: AC
  Administered 2014-03-10: 30 mL via ORAL
  Filled 2014-03-10: qty 30

## 2014-03-10 NOTE — Procedures (Signed)
Central Venous Catheter Insertion Procedure Note [name] [name] [id number] 06-25-1945  Procedure: Insertion of Central Venous Catheter Indications: Assessment of intravascular volume, Drug and/or fluid administration and Frequent blood sampling  Procedure Details Consent: Unable to obtain consent because of emergent medical necessity. Time Out: Verified patient identification, verified procedure, site/side was marked, verified correct patient position, special equipment/implants available, medications/allergies/relevent history reviewed, required imaging and test results available.  Performed Real time [place] was used to ID and cannulate the vessel  Maximum sterile technique was used including antiseptics, cap, gloves, gown, hand hygiene, mask and sheet. Skin prep: Chlorhexidine; local anesthetic administered A antimicrobial bonded/coated triple lumen catheter was placed in the right internal jugular vein using the Seldinger technique.  Evaluation Blood flow good Complications: No apparent complications Patient did tolerate procedure well. Chest X-ray ordered to verify placement.  CXR: pending.  [name],PETE 03/10/2014, 2:48 PM

## 2014-03-10 NOTE — Progress Notes (Signed)
PULMONARY / CRITICAL CARE MEDICINE   Name: [name] [name] MRN: [id number] DOB: 03/26/1945    ADMISSION DATE:  03/26/2014 CONSULTATION DATE:  03/05/14  REFERRING MD :  Alcario Drought  CHIEF COMPLAINT:  hemoptysis  INITIAL PRESENTATION: hemoptysis, LUL cavitary lesion with mediastinal invasion  STUDIES:  CXR: LUL cavitary lseion and mediastinal invasion  SIGNIFICANT EVENTS:   2/9 Bronchoscopy> mucus plugging LUL, mass left upper lobe, biopsy, brushing, washing; biopsy with high grade, poorly differentiated carcinoma   SUBJECTIVE: more dyspneic this morning, afib with RVR, vomiting this morning  VITAL SIGNS: Temp:  [97 F (36.1 C)-100.1 F (37.8 C)] 97 F (36.1 C) (02/11 0800) Pulse Rate:  [69-120] 99 (02/11 0600) Resp:  [12-26] 12 (02/11 0600) BP: (97-128)/(57-68) 114/59 mmHg (02/11 0600) SpO2:  [88 %-97 %] 90 % (02/11 0600) HEMODYNAMICS:   VENTILATOR SETTINGS:   INTAKE / OUTPUT:  Intake/Output Summary (Last 24 hours) at 03/10/14 1036 Last data filed at 03/10/14 0945  Gross per 24 hour  Intake   1780 ml  Output    850 ml  Net    930 ml    PHYSICAL EXAMINATION: General:  Thin, cachectic, respiratory distress today HEENT: NCAT, NRB mask in place PULM: diminished LUL, crackles noted CV: Tachy, irreg irreg, JVD noted Ab: BS+, soft,  Ext: warm, no edema Neuro: A&Ox4, more interactive today  LABS:  CBC  Recent Labs Lab 03/10/2014 0527 03/18/2014 0730 03/09/14 0359  WBC SPECIMEN CONTAMINATED, UNABLE TO PERFORM TEST(S). 29.8* 23.9*  HGB SPECIMEN CONTAMINATED, UNABLE TO PERFORM TEST(S). 9.7* 8.7*  HCT SPECIMEN CONTAMINATED, UNABLE TO PERFORM TEST(S). 28.7* 26.8*  PLT SPECIMEN CONTAMINATED, UNABLE TO PERFORM TEST(S). 440* 377   Coag's  Recent Labs Lab 03/05/14 0628  APTT 54*  INR 1.17   BMET  Recent Labs Lab 03/06/2014 2310 03/06/14 0535 03/06/2014 0730  NA 140 138 135  K 3.9 3.9 4.1  CL 98 104 101  CO2 30 26 24   BUN 24* 17 15  CREATININE 1.59* 1.22 1.17   GLUCOSE 93 79 93   Electrolytes  Recent Labs Lab 03/09/2014 2310 03/06/14 0535 03/18/2014 0730  CALCIUM 8.9 8.2* 7.9*   Sepsis Markers No results for input(s): LATICACIDVEN, PROCALCITON, O2SATVEN in the last 168 hours. ABG  Recent Labs Lab 03/08/14 0017  PHART 7.389  PCO2ART 39.2  PO2ART 63.2*   Liver Enzymes No results for input(s): AST, ALT, ALKPHOS, BILITOT, ALBUMIN in the last 168 hours. Cardiac Enzymes No results for input(s): TROPONINI, PROBNP in the last 168 hours. Glucose No results for input(s): GLUCAP in the last 168 hours.  Imaging  2/11 CXR > pending  ASSESSMENT / PLAN:  Large left upper lobe mass, prior lung cancer and known fungal disease> s/p bronchoscopy 2/8 with high grade poorly differentiated carcinoma Acute on chronic hypoxemic respiratory failure > in setting of probable post obstructive PNA COPD; overall poor prognosis considering cancer, COPD Known pulmonary fungal disease, treated as aspergillus with voriconazole P:   Heme/onc consulted Treat for HCAP, see ID Flutter valve Guaifenesin Titrate O2 to O2 saturation > 88% Cautiously try mucomyst for thick chest secretions 2/11 High risk for intubation> Family wishes for full code status  Afib with RVR HTN P:  12 lead EKG now Diltiazem gtt Monitor Tele  CKD St 3 stable P:   Monitor BMET and UOP Replace electrolytes as needed  Chronic anemia P:  monitor  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  S/P bronchoscopy on 2/8 which showed high grade, poorly differentiated carcinoma.  then developed HCAP vs aspiration vs simply post obstructive process 2/9  early AM.  Worsening respiratory status 2/11, high risk intubation.  Afib RVR 2/11, start dilt, add mucomyst neb if able.     My cc time 45 minutes  [name], MD [facility] Pager: [phone] Cell: [phone] If no response, call [phone]

## 2014-03-10 NOTE — Consult Note (Signed)
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 remains hospitalized thereafter, would recommendations regarding his care Thank you very much for allowing [place] the opportunity to participate in the care of this patient.  Hemoptysis Secondary to malignancy, in the form of a cavitary left upper lobe mass May need radiation oncology to evaluate Appreciate critical care involvement  Healthcare associated pneumonia versus aspiration  Appreciate pulmonary-critical care medicine involvement. His respiratory status is worsening, he is at high risk of intubation  Atrial fibrillation with RVR In the setting of malignancy, and anemia On the Diltiazem. 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 Follow-up with hematology was recommended at that time, but the  patient declined to do so. No transfusion is indicated at this time Monitor counts closely Transfuse blood to maintain a Hb of 8 g or if the patient is acutely  bleeding  Leukocytosis This is due to Steroids, inflammation No intervention is indicated at this time Will continue to monitor  Malnutrition Consider Nutrition evaluation  Full Code  Other medical issues as per admitting team     [facility] E, PA-C 03/10/2014 11:22 AM   ADDENDUM: COURTESY NOTE  69 y.o. 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  Our lung cancer specialist, Dr Julien Nordmann, will be on call this weekend and will fully consult on this case. Please let us know if we canbe of further help at this point.    I personally saw this patient and performed a substantive portion of this encounter with the listed APP documented above.   Chauncey Cruel, MD Medical Oncology and Hematology [facility] [address] [place], [place] [address] Tel. [phone]    Fax. [phone]

## 2014-03-10 NOTE — Progress Notes (Signed)
[facility] Progress Note Patient Name: Cleland Simkins DOB: 1945/09/15 MRN: [id number]   Date of Service  03/10/2014  HPI/Events of Note  Pt's fentanyl; has been titrated up to 400, remains uncomfortable.   eICU Interventions  Will add versed prn pushed and follow; may need to change to alternative infusion     Intervention Category Minor Interventions: Agitation / anxiety - evaluation and management  Adael Culbreath,Rondle S. 03/10/2014, 11:50 PM

## 2014-03-10 NOTE — Progress Notes (Signed)
Pulled ETT back 1 cm per CCM Marni Griffon)

## 2014-03-10 NOTE — Procedures (Signed)
Intubation Procedure Note [name] [name] [id number] 1945/04/01  Procedure: Intubation Indications: Respiratory insufficiency  Procedure Details Consent: Risks of procedure as well as the alternatives and risks of each were explained to the (patient/caregiver).  Consent for procedure obtained. Time Out: Verified patient identification, verified procedure, site/side was marked, verified correct patient position, special equipment/implants available, medications/allergies/relevent history reviewed, required imaging and test results available.  Performed  Drugs Fentanyl 186mcg, Versed 1mg , 20mg  etomidate, Rocuronium 50mg  DL x 1 with GS 3 blade Grade 1 view 8.0 ET tube passed through cords under direct visualization. Secured at 24 cm Placement confirmed with bilateral breath sounds, positive EtCO2 change and smoke in tube   Evaluation Hemodynamic Status: BP stable throughout; O2 sats: stable throughout Patient's Current Condition: stable Complications: No apparent complications Patient did tolerate procedure well. Chest X-ray ordered to verify placement.  CXR: pending.   Simonne Maffucci 03/10/2014

## 2014-03-10 NOTE — Progress Notes (Signed)
ANTIBIOTIC CONSULT NOTE - Follow up  Pharmacy Consult for Vancomcyin/Zosyn Indication: pneumonia  Allergies  Allergen Reactions  . Morphine And Related Itching    Head to toe  . Chantix [Varenicline] Other (See Comments)    Dreams, itching, suicidal ideation  . Hydromorphone Hcl Itching    Tolerates Hydrocodone-Acetaminophen  . Ibuprofen Nausea And Vomiting  . Omnipaque [Iohexol]      Code: HIVES, Desc: FACIAL HIVES S/P IV CONTRAST.Marland KitchenMarland KitchenOK W/ 25MG  BENADRYL//A.C., Onset Date: [id number]   . Shellfish Allergy Swelling    Patient Measurements: Height: 5\' 6"  (167.6 cm) Weight: 120 lb 4.8 oz (54.568 kg) IBW/kg (Calculated) : 63.8 Adjusted Body Weight:   Vital Signs: Temp: 98 F (36.7 C) (02/11 1155) Temp Source: Oral (02/11 1155) BP: 114/59 mmHg (02/11 0600) Pulse Rate: 99 (02/11 0600) Intake/Output from previous day: 02/10 0701 - 02/11 0700 In: 2105 [P.O.:80; I.V.:1725; IV Piggyback:300] Out: 925 [Urine:925] Intake/Output from this shift: Total I/O In: -  Out: 250 [Urine:250]  Labs:  Recent Labs  03/09/14 0359 03/10/14 1135  WBC 23.9* 32.2*  HGB 8.7* 8.8*  PLT 377 410*  CREATININE  --  1.46*   Estimated Creatinine Clearance: 38.4 mL/min (by C-G formula based on Cr of 1.46). No results for input(s): VANCOTROUGH, VANCOPEAK, VANCORANDOM, GENTTROUGH, GENTPEAK, GENTRANDOM, TOBRATROUGH, TOBRAPEAK, TOBRARND, AMIKACINPEAK, AMIKACINTROU, AMIKACIN in the last 72 hours.   Microbiology: Recent Results (from the past 720 hour(s))  Culture, blood (routine x 2) Call MD if unable to obtain prior to antibiotics being given     Status: None (Preliminary result)   Collection Time: 03/05/14  6:28 AM  Result Value Ref Range Status   Specimen Description BLOOD LEFT ARM  Final   Special Requests BOTTLES DRAWN AEROBIC AND ANAEROBIC 10CC EACH  Final   Culture   Final           BLOOD CULTURE RECEIVED NO GROWTH TO DATE CULTURE WILL BE HELD FOR 5 DAYS BEFORE ISSUING A FINAL NEGATIVE  REPORT Performed at [facility]    Report Status PENDING  Incomplete  Culture, blood (routine x 2) Call MD if unable to obtain prior to antibiotics being given     Status: None (Preliminary result)   Collection Time: 03/05/14  6:35 AM  Result Value Ref Range Status   Specimen Description BLOOD LEFT ARM  Final   Special Requests BOTTLES DRAWN AEROBIC AND ANAEROBIC 10CC EACH  Final   Culture   Final           BLOOD CULTURE RECEIVED NO GROWTH TO DATE CULTURE WILL BE HELD FOR 5 DAYS BEFORE ISSUING A FINAL NEGATIVE REPORT Performed at [facility]    Report Status PENDING  Incomplete  Culture, sputum-assessment     Status: None   Collection Time: 03/05/14  6:52 AM  Result Value Ref Range Status   Specimen Description SPUTUM  Final   Special Requests NONE  Final   Sputum evaluation   Final    THIS SPECIMEN IS ACCEPTABLE. RESPIRATORY CULTURE REPORT TO FOLLOW.   Report Status 03/05/2014 FINAL  Final  Fungus Culture with Smear     Status: None (Preliminary result)   Collection Time: 03/05/14  6:52 AM  Result Value Ref Range Status   Specimen Description TRACHEAL SITE  Final   Special Requests NONE  Final   Fungal Smear   Final    NO YEAST OR FUNGAL ELEMENTS SEEN Performed at [facility]    Culture   Final  YEAST ISOLATED;ID TO FOLLOW Performed at [facility]    Report Status PENDING  Incomplete  Culture, respiratory (NON-Expectorated)     Status: None   Collection Time: 03/05/14  6:52 AM  Result Value Ref Range Status   Specimen Description SPUTUM  Final   Special Requests NONE  Final   Gram Stain   Final    NO WBC SEEN FEW SQUAMOUS EPITHELIAL CELLS PRESENT FEW GRAM POSITIVE COCCI IN PAIRS AND CHAINS IN CLUSTERS Performed at [facility]    Culture   Final    NORMAL OROPHARYNGEAL FLORA Performed at [facility]    Report Status 03/09/2014 FINAL  Final  Fungus Culture with Smear     Status: None (Preliminary result)    Collection Time: 03/01/2014 11:53 AM  Result Value Ref Range Status   Specimen Description BRONCHIAL ALVEOLAR LAVAGE  Final   Special Requests Normal  Final   Fungal Smear   Final    NO YEAST OR FUNGAL ELEMENTS SEEN Performed at [facility]    Culture   Final    CULTURE IN PROGRESS FOR FOUR WEEKS Performed at [facility]    Report Status PENDING  Incomplete  Culture, respiratory (NON-Expectorated)     Status: None   Collection Time: 03/20/2014 11:53 AM  Result Value Ref Range Status   Specimen Description BRONCHIAL ALVEOLAR LAVAGE  Final   Special Requests Normal  Final   Gram Stain   Final    ABUNDANT WBC PRESENT, PREDOMINANTLY PMN NO SQUAMOUS EPITHELIAL CELLS SEEN NO ORGANISMS SEEN Performed at [facility]    Culture   Final    Non-Pathogenic Oropharyngeal-type Flora Isolated. Performed at [facility]    Report Status 03/09/2014 FINAL  Final  MRSA PCR Screening     Status: None   Collection Time: 03/08/14  1:15 AM  Result Value Ref Range Status   MRSA by PCR NEGATIVE NEGATIVE Final    Comment:        The GeneXpert MRSA Assay (FDA approved for NASAL specimens only), is one component of a comprehensive MRSA colonization surveillance program. It is not intended to diagnose MRSA infection nor to guide or monitor treatment for MRSA infections.   Culture, blood (routine x 2)     Status: None (Preliminary result)   Collection Time: 03/08/14  8:45 AM  Result Value Ref Range Status   Specimen Description BLOOD RIGHT HAND  Final   Special Requests BOTTLES DRAWN AEROBIC AND ANAEROBIC 3CC  Final   Culture   Final           BLOOD CULTURE RECEIVED NO GROWTH TO DATE CULTURE WILL BE HELD FOR 5 DAYS BEFORE ISSUING A FINAL NEGATIVE REPORT Performed at [facility]    Report Status PENDING  Incomplete  Culture, blood (routine x 2)     Status: None (Preliminary result)   Collection Time: 03/08/14  8:50 AM  Result Value Ref Range Status    Specimen Description BLOOD RIGHT ARM  Final   Special Requests BOTTLES DRAWN AEROBIC AND ANAEROBIC 3CC  Final   Culture   Final           BLOOD CULTURE RECEIVED NO GROWTH TO DATE CULTURE WILL BE HELD FOR 5 DAYS BEFORE ISSUING A FINAL NEGATIVE REPORT Performed at [facility]    Report Status PENDING  Incomplete   Assessment: 30 yoM admitted with hemoptysis and progressing LUL cavitary lesion. 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 He also recently completed a 7 day course of outpt levaquin (1/21). Concerning for either worsening invasive aspergillus vs malignancy. PCCM consulted, repeat bronchoscopy done 2/8 mucus plugging LUL, mass left upper lobe, biopsy, brushing, washing. Orders to start vanco./zosyn 2/9 for HCAP.  (PTA) 2/6 >> voriconazole >> (PTA) 2/6 >> valacyclovir >>  2/9 >> Vanc >> 2/9 >> Zosyn >>  Tmax: AF WBCs: cont rising. 32.2 now. Renal: CKD III, SCr now rising, CrCl 38CG, 50N  2/6 Sputum fungal: no fungal elements seen, cx in process 2/6 sputum: Nl flora 2/6 blood x 2: ngtd 2/8 fungus smear: no yeast or fungal elements seen 2/8 BAL: nonpathogenic flora 2/9 blood x 2:   Goal of Therapy:  Vancomycin trough level 15-20 mcg/ml  Appropriate antibiotic dosing for renal function; eradication of infection  Plan:  Cont Vanc 500mg  IV q12h. Cont Zosyn 3.375g IV Q8H infused over 4hrs. Cont Vfend and Valtrex as ordered.  Measure Vanc trough tonight at 2130.  Follow up renal fxn, culture results, and clinical course.  Romeo Rabon, PharmD, pager [phone]. 03/10/2014,1:14 PM.

## 2014-03-10 NOTE — Progress Notes (Addendum)
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 On 07/29/2013 repeat chest x-ray showed progression of consolidation in the left upper lobe with suspicious areas of cavitation. A sputum culture from 08/06/2013 growing Mycobacterium. AFB cultures were negative. Patient undergoing a bronchoscopy in August 2015 with cultures negative however pathology revealed fungal hyphae. Aspergillus serology positive. He was admitted to the medicine service on 10/03/2013 at which time he was started on voriconazole. CT scan of lungs performed on 10/04/2013 showed extensive infiltrative/inflammatory process in the left lung with large cavitary process. He was discharged on IV Voriconazole, Zosyn, vancomycin that hospitalization. A repeat CT scan of chest performed on 02/25/2014 showed increased size and bulky peripheral soft tissue component of masslike opacity in the left upper lobe with involvement of the left ilium and mediastinum. Also noted was new bone destruction of T5 vertebral body consistent with osseous involvement. Patient admitted to the medicine service on 03/05/2014 presenting with complaints of hemoptysis. Patient was evaluated by pulmonary/critical care medicine recommending continuing current antimicrobial therapy along with bronchoscopy.  Subjective: I discussed his biopsy findings of cancer and superimposed pneumonia with him this morning as he stated to me that no one will tell him how his is doing. He had a number of questions. Pain was controlled at that time.  I was later called by the RN for an episode of vomiting -  vomitus was black in color. Just now I was updated that he is now in SVT and Dr Lake Bells has initiated a Cardizem infusion. Patient is now complaining of uncontrolled chest pain. Due to vomiting, he is unable to take oral pain meds.   Assessment/Plan: Left upper lobe cavitary lesion- hemoptysis-  h/o lung CA now with possible recurrence -Patient found to have changes to left upper lobe from a chest x-ray in July 2015, which has progressed to cavitary process. -In the month of September he was treated with IV vancomycin, Zosyn and voriconazole -on voriconazole 200 mg by mouth twice a day -s/p bronch- biopsy reveals malignant cells- pulm team has contacted Oncology  Chest pain - due to above- has been quite uncontrolled requiring frequent IV Dilaudid - unable to take oral pain meds due to vomiting today- start Fentanyl patch at 50 mcg today  HCAP-  Leukocytosis - Dr [place] Bells suspecting pneumonia as a result of aspiration during bronch and started patient on Vanc and Zosyn on 2/9 - wean O2 as able- in SDU now   A-fib/ ? pulm edema - agree with Cardizem infusion- will order an ECHO and check thyroid function - most likely is secondary to resp distress  Vomitus - black vomitus could indicate GI bleed- will give BID IV protonix, Zofran, Phenergan and follow Hb closely today - current is not a candidate for endoscopy due to resp distress and SVT  Chronic obstructive pulmonary disease -Stable   Code Status: Full code- Dr Lake Bells discussed code status - daughter wants him to be a full code Family Communication: with daughter today Disposition Plan: plan per pulmonary- will d/c home when given clearance by them   Consultants:  Pulmonary critical care medicine   Antibiotics: Anti-infectives    Start     Dose/Rate  Route Frequency Ordered Stop   03/08/14 2200  vancomycin (VANCOCIN) 500 mg in sodium chloride 0.9 % 100 mL IVPB     500 mg 100 mL/hr over 60 Minutes Intravenous Every 12 hours  03/08/14 0931     03/08/14 1100  vancomycin (VANCOCIN) IVPB 1000 mg/200 mL premix     1,000 mg 200 mL/hr over 60 Minutes Intravenous  Once 03/08/14 0931 03/08/14 1155   03/08/14 1100  piperacillin-tazobactam (ZOSYN) IVPB 3.375 g     3.375 g 12.5 mL/hr over 240 Minutes Intravenous 3 times per day 03/08/14 0931     03/05/14 1000  valACYclovir (VALTREX) tablet 1,000 mg     1,000 mg Oral Daily 03/05/14 0152     03/05/14 1000  voriconazole (VFEND) tablet 200 mg     200 mg Oral Every 12 hours 03/05/14 0152        Objective: Filed Vitals:   03/10/14 0800  BP:   Pulse:   Temp: 97 F (36.1 C)  Resp:     Intake/Output Summary (Last 24 hours) at 03/10/14 1121 Last data filed at 03/10/14 0945  Gross per 24 hour  Intake   1705 ml  Output    850 ml  Net    855 ml   Filed Weights   03/05/14 0216  Weight: 54.568 kg (120 lb 4.8 oz)    Exam:   General: cachectic, no acute distress  Cardiovascular: Regular rate and rhythm normal S1-S2  Respiratory: Diminished breath sounds bilaterally, decreased breath sounds bilaterally, scattered end expiratory wheezes, on 100% FiO2   Abdomen: Soft nontender nondistended  Musculoskeletal: Bilateral muscle atrophy  Data Reviewed: Basic Metabolic Panel:  Recent Labs Lab 03/12/2014 2310 03/06/14 0535 03/17/2014 0730  NA 140 138 135  K 3.9 3.9 4.1  CL 98 104 101  CO2 30 26 24   GLUCOSE 93 79 93  BUN 24* 17 15  CREATININE 1.59* 1.22 1.17  CALCIUM 8.9 8.2* 7.9*   Liver Function Tests: No results for input(s): AST, ALT, ALKPHOS, BILITOT, PROT, ALBUMIN in the last 168 hours. No results for input(s): LIPASE, AMYLASE in the last 168 hours. No results for input(s): AMMONIA in the last 168 hours. CBC:  Recent Labs Lab 03/25/2014 2310 03/06/14 0535 03/13/2014 0527 03/24/2014 0730 03/09/14 0359  WBC 27.8* 25.7* SPECIMEN CONTAMINATED, UNABLE TO PERFORM TEST(S). 29.8* 23.9*  NEUTROABS 21.7*  --   --   --   --   HGB 10.4* 8.3* SPECIMEN  CONTAMINATED, UNABLE TO PERFORM TEST(S). 9.7* 8.7*  HCT 30.5* 25.6* SPECIMEN CONTAMINATED, UNABLE TO PERFORM TEST(S). 28.7* 26.8*  MCV 81.8 81.8 SPECIMEN CONTAMINATED, UNABLE TO PERFORM TEST(S). 82.5 83.8  PLT 502* 439* SPECIMEN CONTAMINATED, UNABLE TO PERFORM TEST(S). 440* 377   Cardiac Enzymes: No results for input(s): CKTOTAL, CKMB, CKMBINDEX, TROPONINI in the last 168 hours. BNP (last 3 results)  Recent Labs  03/08/14 0950  BNP 443.0*    ProBNP (last 3 results) No results for input(s): PROBNP in the last 8760 hours.  CBG: No results for input(s): GLUCAP in the last 168 hours.  Recent Results (from the past 240 hour(s))  Culture, blood (routine x 2) Call MD if unable to obtain prior to antibiotics being given     Status: None (Preliminary result)   Collection Time: 03/05/14  6:28 AM  Result Value Ref Range Status   Specimen Description BLOOD LEFT ARM  Final   Special Requests BOTTLES DRAWN AEROBIC AND ANAEROBIC 10CC EACH  Final   Culture  Final           BLOOD CULTURE RECEIVED NO GROWTH TO DATE CULTURE WILL BE HELD FOR 5 DAYS BEFORE ISSUING A FINAL NEGATIVE REPORT Performed at [facility]    Report Status PENDING  Incomplete  Culture, blood (routine x 2) Call MD if unable to obtain prior to antibiotics being given     Status: None (Preliminary result)   Collection Time: 03/05/14  6:35 AM  Result Value Ref Range Status   Specimen Description BLOOD LEFT ARM  Final   Special Requests BOTTLES DRAWN AEROBIC AND ANAEROBIC 10CC EACH  Final   Culture   Final           BLOOD CULTURE RECEIVED NO GROWTH TO DATE CULTURE WILL BE HELD FOR 5 DAYS BEFORE ISSUING A FINAL NEGATIVE REPORT Performed at [facility]    Report Status PENDING  Incomplete  Culture, sputum-assessment     Status: None   Collection Time: 03/05/14  6:52 AM  Result Value Ref Range Status   Specimen Description SPUTUM  Final   Special Requests NONE  Final   Sputum evaluation   Final    THIS  SPECIMEN IS ACCEPTABLE. RESPIRATORY CULTURE REPORT TO FOLLOW.   Report Status 03/05/2014 FINAL  Final  Fungus Culture with Smear     Status: None (Preliminary result)   Collection Time: 03/05/14  6:52 AM  Result Value Ref Range Status   Specimen Description TRACHEAL SITE  Final   Special Requests NONE  Final   Fungal Smear   Final    NO YEAST OR FUNGAL ELEMENTS SEEN Performed at [facility]    Culture   Final    CULTURE IN PROGRESS FOR FOUR WEEKS Performed at [facility]    Report Status PENDING  Incomplete  Culture, respiratory (NON-Expectorated)     Status: None   Collection Time: 03/05/14  6:52 AM  Result Value Ref Range Status   Specimen Description SPUTUM  Final   Special Requests NONE  Final   Gram Stain   Final    NO WBC SEEN FEW SQUAMOUS EPITHELIAL CELLS PRESENT FEW GRAM POSITIVE COCCI IN PAIRS AND CHAINS IN CLUSTERS Performed at [facility]    Culture   Final    NORMAL OROPHARYNGEAL FLORA Performed at [facility]    Report Status 03/16/2014 FINAL  Final  Fungus Culture with Smear     Status: None (Preliminary result)   Collection Time: 03/13/2014 11:53 AM  Result Value Ref Range Status   Specimen Description BRONCHIAL ALVEOLAR LAVAGE  Final   Special Requests Normal  Final   Fungal Smear   Final    NO YEAST OR FUNGAL ELEMENTS SEEN Performed at [facility]    Culture   Final    CULTURE IN PROGRESS FOR FOUR WEEKS Performed at [facility]    Report Status PENDING  Incomplete  Culture, respiratory (NON-Expectorated)     Status: None   Collection Time: 03/22/2014 11:53 AM  Result Value Ref Range Status   Specimen Description BRONCHIAL ALVEOLAR LAVAGE  Final   Special Requests Normal  Final   Gram Stain   Final    ABUNDANT WBC PRESENT, PREDOMINANTLY PMN NO SQUAMOUS EPITHELIAL CELLS SEEN NO ORGANISMS SEEN Performed at [facility]    Culture   Final    Non-Pathogenic Oropharyngeal-type Flora  Isolated. Performed at [facility]    Report Status 03/09/2014 FINAL  Final  MRSA PCR  Screening     Status: None   Collection Time: 03/08/14  1:15 AM  Result Value Ref Range Status   MRSA by PCR NEGATIVE NEGATIVE Final    Comment:        The GeneXpert MRSA Assay (FDA approved for NASAL specimens only), is one component of a comprehensive MRSA colonization surveillance program. It is not intended to diagnose MRSA infection nor to guide or monitor treatment for MRSA infections.   Culture, blood (routine x 2)     Status: None (Preliminary result)   Collection Time: 03/08/14  8:45 AM  Result Value Ref Range Status   Specimen Description BLOOD RIGHT HAND  Final   Special Requests BOTTLES DRAWN AEROBIC AND ANAEROBIC 3CC  Final   Culture   Final           BLOOD CULTURE RECEIVED NO GROWTH TO DATE CULTURE WILL BE HELD FOR 5 DAYS BEFORE ISSUING A FINAL NEGATIVE REPORT Performed at [facility]    Report Status PENDING  Incomplete  Culture, blood (routine x 2)     Status: None (Preliminary result)   Collection Time: 03/08/14  8:50 AM  Result Value Ref Range Status   Specimen Description BLOOD RIGHT ARM  Final   Special Requests BOTTLES DRAWN AEROBIC AND ANAEROBIC 3CC  Final   Culture   Final           BLOOD CULTURE RECEIVED NO GROWTH TO DATE CULTURE WILL BE HELD FOR 5 DAYS BEFORE ISSUING A FINAL NEGATIVE REPORT Performed at [facility]    Report Status PENDING  Incomplete     Studies: Dg Chest Port 1 View  03/10/2014   CLINICAL DATA:  Acute respiratory failure with hypoxia.  EXAM: PORTABLE CHEST - 1 VIEW  COMPARISON:  Radiographs of same day and March 07, 2014.  FINDINGS: No pneumothorax is noted. Mild left pleural effusion is noted. Significantly increased diffuse interstitial densities are noted throughout the right lung concerning for edema or inflammation. There is complete opacification of the left upper lobe concerning for atelectasis or pneumonia  related to neoplasm. Stable cardiac silhouette. Trachea deviation to the right is noted.  IMPRESSION: Increased diffuse interstitial densities are seen throughout the right lung concerning for edema or pneumonia. Increased left upper lobe opacity is noted concerning for neoplasm with associated atelectasis or pneumonia. This results in mild tracheal deviation to the right.   Electronically Signed   By: Marijo Conception, M.D.   On: 03/10/2014 11:03   Dg Chest Port 1 View  03/09/2014   CLINICAL DATA:  Respiratory failure, shortness of breath ; 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  EXAM: PORTABLE CHEST - 1 VIEW  COMPARISON:  Portable chest x-ray of March 07, 2014  FINDINGS: The right lung remains well-expanded. There is stable right apical pleural thickening and stable increased interstitial density in the lung. On the left there is increasing density in the upper 1/2 of the hemi thorax with mild displacement of the trachea toward the right. There remains a small amount of aerated right lower lung. There is partial obscuration of the hemidiaphragm. The cardiac silhouette is normal in size. The pulmonary vascularity is not engorged. The observed bony thorax is unremarkable.  IMPRESSION: 1. Increasing opacification of the right mid and upper hemi thorax with evidence of mild displacement of the trachea toward the right. This is consistent with progressive atelectasis and/or pneumonia. There is a known left upper lobe mass. 2. Stable mildly increased  interstitial density in the right lung.   Electronically Signed   By: David  Martinique   On: 03/09/2014 07:41    Scheduled Meds: . acetylcysteine  3 mL Nebulization BID  . amLODipine  10 mg Oral Daily  . antiseptic oral rinse  7 mL Mouth Rinse BID  . butamben-tetracaine-benzocaine  1 spray Topical Once  . diltiazem  15 mg Intravenous Once  . feeding supplement (ENSURE COMPLETE)  237 mL Oral TID BM  . fluticasone  2 spray Each Nare  Daily  . furosemide  40 mg Intravenous Q6H  . guaiFENesin  600 mg Oral BID  . latanoprost  1 drop Both Eyes QHS  . lidocaine  1 application Topical Once  . ondansetron (ZOFRAN) IV  8 mg Intravenous Once  . pantoprazole (PROTONIX) IV  40 mg Intravenous Q12H  . piperacillin-tazobactam (ZOSYN)  IV  3.375 g Intravenous 3 times per day  . tamsulosin  0.4 mg Oral QPC supper  . valACYclovir  1,000 mg Oral Daily  . vancomycin  500 mg Intravenous Q12H  . voriconazole  200 mg Oral Q12H   Continuous Infusions: . sodium chloride 75 mL/hr at 03/10/14 0611  . sodium chloride Stopped (03/26/2014 1300)  . diltiazem (CARDIZEM) infusion 5 mg/hr (03/10/14 1059)       Time spent: 30 min    [facility] Pager -  [email or web address], password [facility] 03/10/2014, 11:21 AM  LOS: 5 days

## 2014-03-11 ENCOUNTER — Inpatient Hospital Stay (HOSPITAL_COMMUNITY): Payer: Medicare Other

## 2014-03-11 DIAGNOSIS — I472 Ventricular tachycardia: Secondary | ICD-10-CM

## 2014-03-11 DIAGNOSIS — M7989 Other specified soft tissue disorders: Secondary | ICD-10-CM

## 2014-03-11 DIAGNOSIS — Z86718 Personal history of other venous thrombosis and embolism: Secondary | ICD-10-CM

## 2014-03-11 LAB — CULTURE, BLOOD (ROUTINE X 2)
CULTURE: NO GROWTH
Culture: NO GROWTH

## 2014-03-11 LAB — T4, FREE: Free T4: 1.02 ng/dL (ref 0.80–1.80)

## 2014-03-11 LAB — BASIC METABOLIC PANEL
Anion gap: 10 (ref 5–15)
BUN: 19 mg/dL (ref 6–23)
CHLORIDE: 106 mmol/L (ref 96–112)
CO2: 22 mmol/L (ref 19–32)
Calcium: 7.9 mg/dL — ABNORMAL LOW (ref 8.4–10.5)
Creatinine, Ser: 1.73 mg/dL — ABNORMAL HIGH (ref 0.50–1.35)
GFR calc Af Amer: 46 mL/min — ABNORMAL LOW (ref >90)
GFR calc non Af Amer: 39 mL/min — ABNORMAL LOW (ref >90)
Glucose, Bld: 167 mg/dL — ABNORMAL HIGH (ref 70–99)
Potassium: 4.2 mmol/L (ref 3.5–5.1)
Sodium: 138 mmol/L (ref 135–145)

## 2014-03-11 LAB — GLUCOSE, CAPILLARY
Glucose-Capillary: 120 mg/dL — ABNORMAL HIGH (ref 70–99)
Glucose-Capillary: 148 mg/dL — ABNORMAL HIGH (ref 70–99)
Glucose-Capillary: 164 mg/dL — ABNORMAL HIGH (ref 70–99)
Glucose-Capillary: 133 mg/dL — ABNORMAL HIGH (ref 70–99)
Glucose-Capillary: 132 mg/dL — ABNORMAL HIGH (ref 70–99)

## 2014-03-11 LAB — LACTIC ACID, PLASMA: LACTIC ACID, VENOUS: 1.4 mmol/L (ref 0.5–2.0)

## 2014-03-11 LAB — TSH: TSH: 4.234 u[IU]/mL (ref 0.350–4.500)

## 2014-03-11 LAB — VANCOMYCIN, TROUGH: Vancomycin Tr: 16.8 ug/mL (ref 10.0–20.0)

## 2014-03-11 MED ORDER — SODIUM CHLORIDE 0.9 % IV SOLN
INTRAVENOUS | Status: DC
  Administered 2014-03-12 – 2014-03-14 (×2): via INTRAVENOUS

## 2014-03-11 MED ORDER — METOPROLOL TARTRATE 1 MG/ML IV SOLN
INTRAVENOUS | Status: AC
  Filled 2014-03-11: qty 5

## 2014-03-11 MED ORDER — HEPARIN SODIUM (PORCINE) 5000 UNIT/ML IJ SOLN
5000.0000 [IU] | Freq: Three times a day (TID) | INTRAMUSCULAR | Status: DC
  Administered 2014-03-11 – 2014-03-19 (×26): 5000 [IU] via SUBCUTANEOUS
  Filled 2014-03-11 (×26): qty 1

## 2014-03-11 MED ORDER — VITAL AF 1.2 CAL PO LIQD
1000.0000 mL | ORAL | Status: DC
  Administered 2014-03-11: 1000 mL
  Filled 2014-03-11 (×6): qty 1000

## 2014-03-11 MED ORDER — ACETAMINOPHEN 10 MG/ML IV SOLN
1000.0000 mg | Freq: Once | INTRAVENOUS | Status: AC
  Administered 2014-03-11: 1000 mg via INTRAVENOUS
  Filled 2014-03-11: qty 100

## 2014-03-11 MED ORDER — VITAL HIGH PROTEIN PO LIQD: 1000.0000 mL | ORAL | Status: DC

## 2014-03-11 MED ORDER — METOPROLOL TARTRATE 1 MG/ML IV SOLN
5.0000 mg | INTRAVENOUS | Status: DC | PRN
  Administered 2014-03-11: 5 mg via INTRAVENOUS
  Filled 2014-03-11 (×2): qty 5

## 2014-03-11 MED ORDER — METOPROLOL TARTRATE 1 MG/ML IV SOLN
5.0000 mg | Freq: Once | INTRAVENOUS | Status: AC
  Administered 2014-03-11: 5 mg via INTRAVENOUS

## 2014-03-11 MED ORDER — SODIUM CHLORIDE 0.9 % IV SOLN
INTRAVENOUS | Status: DC
  Administered 2014-03-14: 17:00:00 via INTRAVENOUS

## 2014-03-11 NOTE — Progress Notes (Signed)
[facility] Progress Note Patient Name: Maki Sweetser DOB: [birth year] MRN: [id number]   Date of Service  03/11/2014  HPI/Events of Note  A fib with HR 140's - has received amio bolus and currently on infusion.   eICU Interventions  BP adequate to try metoprolol 5mg , assess for improved rate control      Intervention Category Intermediate Interventions: Arrhythmia - evaluation and management  Kura Bethards,Ledger S. 03/11/2014, 2:26 AM

## 2014-03-11 NOTE — Progress Notes (Signed)
[facility] Progress Note Patient Name: [name] [name] DOB: 1945/02/11 MRN: [id number]   Date of Service  03/11/2014  HPI/Events of Note    eICU Interventions  PCV for synchrony     Intervention Category Intermediate Interventions: Respiratory distress - evaluation and management  ALVA,RAKESH V. 03/11/2014, 8:06 PM

## 2014-03-11 NOTE — Progress Notes (Signed)
  Echocardiogram 2D Echocardiogram has been performed.  [name] [name] 03/11/2014, 10:48 AM

## 2014-03-11 NOTE — Progress Notes (Signed)
CARE MANAGEMENT NOTE 03/11/2014  Patient:  [name] [name],[name] [name]   Account Number:  [id number]  Date Initiated:  03/25/2014  Documentation initiated by:  Dessa Phi  Subjective/Objective Assessment:   69 y/o m admitted w/Lung mass.ZS:WFUX Ca.     Action/Plan:   From home.   Anticipated DC Date:  03/14/2014   Anticipated DC Plan:  [place]  CM consult      Choice offered to / List presented to:             Status of service:  In process, will continue to follow Medicare Important Message given?  YES (If response is "NO", the following Medicare IM given date fields will be blank) Date Medicare IM given:  03/23/2014 Medicare IM given by:  [facility] Date Additional Medicare IM given:  03/09/2014 Additional Medicare IM given by:  [facility] Raynisha Avilla  Discharge Disposition:    Per UR Regulation:  Reviewed for med. necessity/level of care/duration of stay  If discussed at [place] of Stay Meetings, dates discussed:    Comments:  Feb. 12 2016/Rainie Crenshaw L. Rosana Hoes, RN, BSN, CCM/Case Management [place] [phone] No discharge needs present of time of review. patient requuired intubation pm of [id number].  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  S/P bronchoscopy on 2/8, then developed HCAP vs aspiration 2/9 early AM.  Treat as HCAP, f/u bronch and cultures.  03/15/2014 Dessa Phi RN BSN NCM [phone] Bronchoscopy today.Noted may d/c today post procedure if stableNo anticipated d/c needs.
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
## 2014-03-11 NOTE — Progress Notes (Signed)
VASCULAR LAB PRELIMINARY  PRELIMINARY  PRELIMINARY  PRELIMINARY  Left upper extremity venous duplex and right lower extremity arterial duplex completed.    Preliminary report:   1.  Venous:  Left:  No evidence of DVT or superficial thrombosis.    2.  Arterial:  Patent ATA at the ankle.  FA occlusion consistent with angiogram of 2014 showing peripheral arterial disease with ATA runoff only.    Tanush Drees, RVT 03/11/2014, 11:23 AM

## 2014-03-11 NOTE — Progress Notes (Signed)
PULMONARY / CRITICAL CARE MEDICINE   Name: [name] [name] MRN: [id number] DOB: [birth year]    ADMISSION DATE:  03/27/2014 CONSULTATION DATE:  03/05/14  REFERRING MD :  Alcario Drought  CHIEF COMPLAINT:  hemoptysis  INITIAL PRESENTATION: hemoptysis, LUL cavitary lesion with mediastinal invasion  STUDIES:  CXR: LUL cavitary lseion and mediastinal invasion  SIGNIFICANT EVENTS:   2/9 Bronchoscopy> mucus plugging LUL, mass left upper lobe, biopsy, brushing, washing; biopsy with high grade, poorly differentiated carcinoma 2/11 Intubated  SUBJECTIVE: Intubated yesterday, requiring high sedation and FiO2, Afib with RVR  VITAL SIGNS: Temp:  [97.9 F (36.6 C)-101 F (38.3 C)] 98.8 F (37.1 C) (02/12 0340) Pulse Rate:  [135-155] 135 (02/12 0358) Resp:  [9-25] 11 (02/12 0600) BP: (69-157)/(52-84) 99/63 mmHg (02/12 0600) SpO2:  [84 %-100 %] 88 % (02/12 0825) FiO2 (%):  [60 %-100 %] 70 % (02/12 0825) Weight:  [57.2 kg (126 lb 1.7 oz)-61.2 kg (134 lb 14.7 oz)] 61.2 kg (134 lb 14.7 oz) (02/12 0545) HEMODYNAMICS:   VENTILATOR SETTINGS: Vent Mode:  [-] PRVC FiO2 (%):  [60 %-100 %] 70 % Set Rate:  [14 bmp] 14 bmp Vt Set:  [500 mL] 500 mL PEEP:  [8 cmH20] 8 cmH20 Plateau Pressure:  [14 cmH20-28 cmH20] 28 cmH20 INTAKE / OUTPUT:  Intake/Output Summary (Last 24 hours) at 03/11/14 0854 Last data filed at 03/11/14 0600  Gross per 24 hour  Intake 3876.71 ml  Output   1480 ml  Net 2396.71 ml    PHYSICAL EXAMINATION: General:  Cachectic male on vent HEENT: NCAT, ETT in place PULM: diminished LUL, crackles noted bibasilar CV: Tachy, irreg irreg, JVD noted Ab: BS+, soft,  Ext: warm, no edema Neuro: A&Ox4, more interactive today  LABS:  CBC  Recent Labs Lab 03/09/14 0359 03/10/14 1135 03/10/14 1800  WBC 23.9* 32.2* 32.7*  HGB 8.7* 8.8* 8.8*  HCT 26.8* 27.2* 28.0*  PLT 377 410* 437*   Coag's  Recent Labs Lab 03/05/14 0628  APTT 54*  INR 1.17   BMET  Recent Labs Lab  03/06/14 0535 03/24/2014 0730 03/10/14 1135  NA 138 135 142  K 3.9 4.1 4.0  CL 104 101 109  CO2 26 24 22   BUN 17 15 17   CREATININE 1.22 1.17 1.46*  GLUCOSE 79 93 167*   Electrolytes  Recent Labs Lab 03/06/14 0535 03/25/2014 0730 03/10/14 1135  CALCIUM 8.2* 7.9* 8.2*   Sepsis Markers No results for input(s): LATICACIDVEN, PROCALCITON, O2SATVEN in the last 168 hours. ABG  Recent Labs Lab 03/08/14 0017 03/10/14 1537  PHART 7.389 7.238*  PCO2ART 39.2 52.1*  PO2ART 63.2* 82.4   Liver Enzymes No results for input(s): AST, ALT, ALKPHOS, BILITOT, ALBUMIN in the last 168 hours. Cardiac Enzymes No results for input(s): TROPONINI, PROBNP in the last 168 hours. Glucose  Recent Labs Lab 03/10/14 1541 03/10/14 1933 03/10/14 2309 03/11/14 0338  GLUCAP 158* 124* 120* 120*    Imaging  2/11 CXR > pending  ASSESSMENT / PLAN:  Respiratory Acute respiratory failure with hypoxemia > due to HCAP, COPD, mass, pulmonary edema? 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, treated as aspergillus with voriconazole Poor prognosis from respiratory standpoint P:   Heme/onc consulted Treat for HCAP, see ID Pulmonary toilette with vent Titrate O2 to O2 saturation > 88% Plan is for vent no longer than 7 days Diurese when BP allows  CV Afib with RVR Shock> presumably sedation related, possibly septic shock  P:  Check lactic acid Neosynephrine considering afib rvr CVP goal 8-12 Wean neo for MAP > 65 Re-bolus amiodarone now Continue amiodarone gtt F/u Echo Monitor Tele  Renal CKD St 3 stable P:   Monitor BMET and UOP Replace electrolytes as needed  Heme Chronic anemia, not bleeding P:  monitor  ID 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  Family updated bedside today and I explained that his prognosis is poor. Because we have just now diagnosed his lung cancer the family remains in shock and wishes to attempt full support with the ventilator to see if we can offer him treatment.  Input from Oncology appreciated.  Today will treat afib rvr, continue to treat pneumonia.   My cc time 45 minutes  [name], MD [place] [facility] Pager: [phone] Cell: [phone] If no response, call [phone]

## 2014-03-11 NOTE — Progress Notes (Signed)
[name] Progress Note Patient Name: [name] [name] DOB: 05-28-1945 MRN: [id number]   Date of Service  03/11/2014  HPI/Events of Note  febrile  eICU Interventions  Iv tylenol, cannot take po     Intervention Category Minor Interventions: Routine modifications to care plan (e.g. PRN medications for pain, fever)  Asencion Noble 03/11/2014, 10:09 PM

## 2014-03-11 NOTE — Progress Notes (Signed)
ANTIBIOTIC CONSULT NOTE - Follow up  Pharmacy Consult for Vancomcyin/Zosyn Indication: pneumonia  Allergies  Allergen Reactions  . Morphine And Related Itching    Head to toe  . Chantix [Varenicline] Other (See Comments)    Dreams, itching, suicidal ideation  . Hydromorphone Hcl Itching    Tolerates Hydrocodone-Acetaminophen  . Ibuprofen Nausea And Vomiting  . Omnipaque [Iohexol]      Code: HIVES, Desc: FACIAL HIVES S/P IV CONTRAST.Marland KitchenMarland KitchenOK W/ 25MG  BENADRYL//A.C., Onset Date: [id number]   . Shellfish Allergy Swelling    Patient Measurements: Height: 5\' 6"  (167.6 cm) Weight: 134 lb 14.7 oz (61.2 kg) IBW/kg (Calculated) : 63.8 Adjusted Body Weight:   Vital Signs: Temp: 100.5 F (38.1 C) (02/12 1200) Temp Source: Oral (02/12 1200) BP: 85/57 mmHg (02/12 0900) Pulse Rate: 135 (02/12 0358) Intake/Output from previous day: 02/11 0701 - 02/12 0700 In: 4139.4 [I.V.:2919.4; NG/GT:420; IV Piggyback:800] Out: 1480 [Urine:1480] Intake/Output from this shift: Total I/O In: 262 [I.V.:202; NG/GT:60] Out: 115 [Urine:115]  Labs:  Recent Labs  03/09/14 0359 03/10/14 1135 03/10/14 1800 03/11/14 1047  WBC 23.9* 32.2* 32.7*  --   HGB 8.7* 8.8* 8.8*  --   PLT 377 410* 437*  --   CREATININE  --  1.46*  --  1.73*   Estimated Creatinine Clearance: 36.4 mL/min (by C-G formula based on Cr of 1.73).  Recent Labs  03/11/14 1047  Nebo 16.8     Microbiology: Recent Results (from the past 720 hour(s))  Culture, blood (routine x 2) Call MD if unable to obtain prior to antibiotics being given     Status: None   Collection Time: 03/05/14  6:28 AM  Result Value Ref Range Status   Specimen Description BLOOD LEFT ARM  Final   Special Requests BOTTLES DRAWN AEROBIC AND ANAEROBIC 10CC EACH  Final   Culture   Final    NO GROWTH 5 DAYS Performed at [facility]    Report Status 03/11/2014 FINAL  Final  Culture, blood (routine x 2) Call MD if unable to obtain prior to  antibiotics being given     Status: None   Collection Time: 03/05/14  6:35 AM  Result Value Ref Range Status   Specimen Description BLOOD LEFT ARM  Final   Special Requests BOTTLES DRAWN AEROBIC AND ANAEROBIC 10CC EACH  Final   Culture   Final    NO GROWTH 5 DAYS Performed at [facility]    Report Status 03/11/2014 FINAL  Final  Culture, sputum-assessment     Status: None   Collection Time: 03/05/14  6:52 AM  Result Value Ref Range Status   Specimen Description SPUTUM  Final   Special Requests NONE  Final   Sputum evaluation   Final    THIS SPECIMEN IS ACCEPTABLE. RESPIRATORY CULTURE REPORT TO FOLLOW.   Report Status 03/05/2014 FINAL  Final  Fungus Culture with Smear     Status: None (Preliminary result)   Collection Time: 03/05/14  6:52 AM  Result Value Ref Range Status   Specimen Description TRACHEAL SITE  Final   Special Requests NONE  Final   Fungal Smear   Final    NO YEAST OR FUNGAL ELEMENTS SEEN Performed at [facility]    Culture   Final    YEAST ISOLATED;ID TO FOLLOW Performed at [facility]    Report Status PENDING  Incomplete  Culture, respiratory (NON-Expectorated)     Status: None   Collection Time: 03/05/14  6:52 AM  Result Value Ref Range Status   Specimen Description SPUTUM  Final   Special Requests NONE  Final   Gram Stain   Final    NO WBC SEEN FEW SQUAMOUS EPITHELIAL CELLS PRESENT FEW GRAM POSITIVE COCCI IN PAIRS AND CHAINS IN CLUSTERS Performed at [facility]    Culture   Final    NORMAL OROPHARYNGEAL FLORA Performed at [facility]    Report Status 03/10/2014 FINAL  Final  Fungus Culture with Smear     Status: None (Preliminary result)   Collection Time: 03/06/2014 11:53 AM  Result Value Ref Range Status   Specimen Description BRONCHIAL ALVEOLAR LAVAGE  Final   Special Requests Normal  Final   Fungal Smear   Final    NO YEAST OR FUNGAL ELEMENTS SEEN Performed at [facility]    Culture    Final    CULTURE IN PROGRESS FOR FOUR WEEKS Performed at [facility]    Report Status PENDING  Incomplete  Culture, respiratory (NON-Expectorated)     Status: None   Collection Time: 03/20/2014 11:53 AM  Result Value Ref Range Status   Specimen Description BRONCHIAL ALVEOLAR LAVAGE  Final   Special Requests Normal  Final   Gram Stain   Final    ABUNDANT WBC PRESENT, PREDOMINANTLY PMN NO SQUAMOUS EPITHELIAL CELLS SEEN NO ORGANISMS SEEN Performed at [facility]    Culture   Final    Non-Pathogenic Oropharyngeal-type Flora Isolated. Performed at [facility]    Report Status 03/09/2014 FINAL  Final  MRSA PCR Screening     Status: None   Collection Time: 03/08/14  1:15 AM  Result Value Ref Range Status   MRSA by PCR NEGATIVE NEGATIVE Final    Comment:        The GeneXpert MRSA Assay (FDA approved for NASAL specimens only), is one component of a comprehensive MRSA colonization surveillance program. It is not intended to diagnose MRSA infection nor to guide or monitor treatment for MRSA infections.   Culture, blood (routine x 2)     Status: None (Preliminary result)   Collection Time: 03/08/14  8:45 AM  Result Value Ref Range Status   Specimen Description BLOOD RIGHT HAND  Final   Special Requests BOTTLES DRAWN AEROBIC AND ANAEROBIC 3CC  Final   Culture   Final           BLOOD CULTURE RECEIVED NO GROWTH TO DATE CULTURE WILL BE HELD FOR 5 DAYS BEFORE ISSUING A FINAL NEGATIVE REPORT Performed at [facility]    Report Status PENDING  Incomplete  Culture, blood (routine x 2)     Status: None (Preliminary result)   Collection Time: 03/08/14  8:50 AM  Result Value Ref Range Status   Specimen Description BLOOD RIGHT ARM  Final   Special Requests BOTTLES DRAWN AEROBIC AND ANAEROBIC 3CC  Final   Culture   Final           BLOOD CULTURE RECEIVED NO GROWTH TO DATE CULTURE WILL BE HELD FOR 5 DAYS BEFORE ISSUING A FINAL NEGATIVE REPORT Performed at  [facility]    Report Status PENDING  Incomplete   Assessment: 70 yoM admitted with hemoptysis and progressing LUL cavitary lesion. 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 He also recently completed a 7 day course of outpt levaquin (1/21). Concerning for either worsening invasive aspergillus vs malignancy. PCCM consulted, repeat bronchoscopy done 2/8 mucus plugging LUL, mass left upper  lobe, biopsy, brushing, washing. Orders to start vanco./zosyn 2/9 for HCAP.  (PTA) 2/6 >> voriconazole po>> (PTA) 2/6 >> valacyclovir po >>  2/9 >> Vanc >> 2/9 >> Zosyn >>  Tmax: AF WBCs: cont rising. 32.2 now. Renal: CKD III, SCr now rising, CrCl 38CG, 50N  2/6 Sputum fungal: no fungal elements on smear, cx in process 2/6 sputum: nl flora 2/6 blood x 2: ngtd 2/8 fungus smear: no yeast or fungal elements seen 2/8 BAL: nonpathogenic flora 2/9 blood x 2: ngtd  Today 2/12: Vancomycin trough 16.8 mcg/ml on 500mg  q12, in goal range  Goal of Therapy:  Vancomycin trough level 15-20 mcg/ml  Appropriate antibiotic dosing for renal function; eradication of infection  Plan:  Cont Vanc 500mg  IV q12h. Cont Zosyn 3.375g IV Q8H infused over 4hrs. Cont Vfend and Valtrex as ordered.  Follow up renal fxn, culture results, and clinical course.  Minda Ditto PharmD Pager [phone] 03/11/2014, 12:47 PM

## 2014-03-12 ENCOUNTER — Inpatient Hospital Stay (HOSPITAL_COMMUNITY): Payer: Medicare Other

## 2014-03-12 DIAGNOSIS — J9601 Acute respiratory failure with hypoxia: Secondary | ICD-10-CM

## 2014-03-12 LAB — CBC WITH DIFFERENTIAL/PLATELET
BASOS ABS: 0 10*3/uL (ref 0.0–0.1)
Basophils Relative: 0 % (ref 0–1)
Eosinophils Absolute: 0.9 10*3/uL — ABNORMAL HIGH (ref 0.0–0.7)
Eosinophils Relative: 3 % (ref 0–5)
HEMATOCRIT: 27.7 % — AB (ref 39.0–52.0)
HEMOGLOBIN: 8.9 g/dL — AB (ref 13.0–17.0)
LYMPHS PCT: 4 % — AB (ref 12–46)
Lymphs Abs: 1.3 10*3/uL (ref 0.7–4.0)
MCH: 27.2 pg (ref 26.0–34.0)
MCHC: 32.1 g/dL (ref 30.0–36.0)
MCV: 84.7 fL (ref 78.0–100.0)
Monocytes Absolute: 2.5 10*3/uL — ABNORMAL HIGH (ref 0.1–1.0)
Monocytes Relative: 8 % (ref 3–12)
Neutro Abs: 26.9 10*3/uL — ABNORMAL HIGH (ref 1.7–7.7)
Neutrophils Relative %: 85 % — ABNORMAL HIGH (ref 43–77)
Platelets: 439 10*3/uL — ABNORMAL HIGH (ref 150–400)
RBC: 3.27 MIL/uL — ABNORMAL LOW (ref 4.22–5.81)
RDW: 16.2 % — ABNORMAL HIGH (ref 11.5–15.5)
WBC: 31.6 10*3/uL — ABNORMAL HIGH (ref 4.0–10.5)

## 2014-03-12 LAB — BLOOD GAS, ARTERIAL
Acid-base deficit: 4 mmol/L — ABNORMAL HIGH (ref 0.0–2.0)
BICARBONATE: 20.9 meq/L (ref 20.0–24.0)
DRAWN BY: 307971
FIO2: 0.7 %
LHR: 14 {breaths}/min
O2 Saturation: 91.1 %
PCO2 ART: 39.8 mmHg (ref 35.0–45.0)
PEEP: 8 cmH2O
PH ART: 7.34 — AB (ref 7.350–7.450)
PRESSURE SUPPORT: 0 cmH2O
Patient temperature: 37
Pressure control: 12 cmH2O
TCO2: 18.5 mmol/L (ref 0–100)
pO2, Arterial: 64.3 mmHg — ABNORMAL LOW (ref 80.0–100.0)

## 2014-03-12 LAB — GLUCOSE, CAPILLARY
GLUCOSE-CAPILLARY: 137 mg/dL — AB (ref 70–99)
Glucose-Capillary: 131 mg/dL — ABNORMAL HIGH (ref 70–99)
Glucose-Capillary: 146 mg/dL — ABNORMAL HIGH (ref 70–99)
Glucose-Capillary: 153 mg/dL — ABNORMAL HIGH (ref 70–99)
Glucose-Capillary: 155 mg/dL — ABNORMAL HIGH (ref 70–99)

## 2014-03-12 LAB — BASIC METABOLIC PANEL
Anion gap: 9 (ref 5–15)
BUN: 20 mg/dL (ref 6–23)
CALCIUM: 7.9 mg/dL — AB (ref 8.4–10.5)
CO2: 21 mmol/L (ref 19–32)
CREATININE: 1.86 mg/dL — AB (ref 0.50–1.35)
Chloride: 108 mmol/L (ref 96–112)
GFR calc Af Amer: 42 mL/min — ABNORMAL LOW (ref >90)
GFR calc non Af Amer: 36 mL/min — ABNORMAL LOW (ref >90)
Glucose, Bld: 134 mg/dL — ABNORMAL HIGH (ref 70–99)
Potassium: 4.6 mmol/L (ref 3.5–5.1)
Sodium: 138 mmol/L (ref 135–145)

## 2014-03-12 MED ORDER — DILTIAZEM HCL 100 MG IV SOLR
5.0000 mg/h | INTRAVENOUS | Status: DC
  Administered 2014-03-12: 15 mg/h via INTRAVENOUS
  Administered 2014-03-12: 5 mg/h via INTRAVENOUS
  Administered 2014-03-13 (×3): 15 mg/h via INTRAVENOUS
  Filled 2014-03-12: qty 100

## 2014-03-12 MED ORDER — PHENYLEPHRINE HCL 10 MG/ML IJ SOLN
30.0000 ug/min | INTRAMUSCULAR | Status: DC
  Administered 2014-03-12: 150 ug/min via INTRAVENOUS
  Administered 2014-03-12: 160 ug/min via INTRAVENOUS
  Administered 2014-03-13: 120 ug/min via INTRAVENOUS
  Administered 2014-03-13: 100 ug/min via INTRAVENOUS
  Administered 2014-03-14: 50 ug/min via INTRAVENOUS
  Administered 2014-03-15: 110 ug/min via INTRAVENOUS
  Administered 2014-03-15: 130 ug/min via INTRAVENOUS
  Administered 2014-03-15: 110 ug/min via INTRAVENOUS
  Administered 2014-03-16: 130 ug/min via INTRAVENOUS
  Administered 2014-03-16: 90 ug/min via INTRAVENOUS
  Administered 2014-03-16: 130 ug/min via INTRAVENOUS
  Administered 2014-03-16: 120 ug/min via INTRAVENOUS
  Administered 2014-03-17: 100 ug/min via INTRAVENOUS
  Administered 2014-03-17: 130 ug/min via INTRAVENOUS
  Administered 2014-03-17: 80 ug/min via INTRAVENOUS
  Administered 2014-03-18: 150 ug/min via INTRAVENOUS
  Administered 2014-03-18: 160 ug/min via INTRAVENOUS
  Administered 2014-03-18: 130 ug/min via INTRAVENOUS
  Administered 2014-03-18: 155 ug/min via INTRAVENOUS
  Administered 2014-03-19: 190 ug/min via INTRAVENOUS
  Administered 2014-03-19 (×2): 160 ug/min via INTRAVENOUS
  Administered 2014-03-19: 200 ug/min via INTRAVENOUS
  Filled 2014-03-12 (×26): qty 4

## 2014-03-12 MED ORDER — DILTIAZEM LOAD VIA INFUSION
10.0000 mg | Freq: Once | INTRAVENOUS | Status: AC
  Administered 2014-03-12: 10 mg via INTRAVENOUS
  Filled 2014-03-12: qty 10

## 2014-03-12 MED ORDER — QUETIAPINE FUMARATE 50 MG PO TABS
25.0000 mg | ORAL_TABLET | Freq: Every day | ORAL | Status: DC
  Administered 2014-03-12 – 2014-03-17 (×6): 25 mg via ORAL
  Filled 2014-03-12 (×7): qty 1

## 2014-03-12 NOTE — Progress Notes (Signed)
PULMONARY / CRITICAL CARE MEDICINE   Name: [name] [name] MRN: [id number] DOB: [birth year]    ADMISSION DATE:  03/10/2014 CONSULTATION DATE:  03/05/14  REFERRING MD :  Alcario Drought  CHIEF COMPLAINT:  hemoptysis  INITIAL PRESENTATION: hemoptysis, LUL cavitary lesion with mediastinal invasion  STUDIES:  CXR: LUL cavitary lseion and mediastinal invasion  SIGNIFICANT EVENTS:   2/9 Bronchoscopy> mucus plugging LUL, mass left upper lobe, biopsy, brushing, washing; biopsy with high grade, poorly differentiated carcinoma 2/11 Intubated  SUBJECTIVE: Intubated 2/11, requiring high sedation and FiO2, Afib with RVR  VITAL SIGNS: Temp:  [98.8 F (37.1 C)-103 F (39.4 C)] 98.8 F (37.1 C) (02/13 0329) Pulse Rate:  [130-138] 130 (02/13 0414) Resp:  [12-28] 21 (02/13 1000) BP: (79-125)/(55-83) 125/83 mmHg (02/13 1000) SpO2:  [86 %-98 %] 93 % (02/13 1000) FiO2 (%):  [50 %-80 %] 70 % (02/13 0900) Weight:  [64.5 kg (142 lb 3.2 oz)] 64.5 kg (142 lb 3.2 oz) (02/13 0500)   HEMODYNAMICS: CVP:  [20 mmHg-25 mmHg] 22 mmHg   VENTILATOR SETTINGS: Vent Mode:  [-] PCV FiO2 (%):  [50 %-80 %] 70 % Set Rate:  [14 bmp] 14 bmp Vt Set:  [550 mL] 550 mL PEEP:  [8 cmH20] 8 cmH20 Pressure Support:  [15 cmH20] 15 cmH20 Plateau Pressure:  [18 cmH20-21 cmH20] 19 cmH20   INTAKE / OUTPUT:  Intake/Output Summary (Last 24 hours) at 03/12/14 1109 Last data filed at 03/12/14 1003  Gross per 24 hour  Intake 5793.2 ml  Output    655 ml  Net 5138.2 ml   PHYSICAL EXAMINATION: General:  Cachectic male on vent HEENT: [place]/AT, ETT in place PULM: diminished LUL, crackles noted bibasilar CV: Tachy, irreg irreg, JVD noted Ab: BS+, soft,  Ext: warm, no edema Neuro: A&Ox4, more interactive today  LABS:  CBC  Recent Labs Lab 03/10/14 1135 03/10/14 1800 03/12/14 0500  WBC 32.2* 32.7* 31.6*  HGB 8.8* 8.8* 8.9*  HCT 27.2* 28.0* 27.7*  PLT 410* 437* 439*   Coag's No results for input(s): APTT, INR in the last 168  hours. BMET  Recent Labs Lab 03/10/14 1135 03/11/14 1047 03/12/14 0610  NA 142 138 138  K 4.0 4.2 4.6  CL 109 106 108  CO2 22 22 21   BUN 17 19 20   CREATININE 1.46* 1.73* 1.86*  GLUCOSE 167* 167* 134*   Electrolytes  Recent Labs Lab 03/10/14 1135 03/11/14 1047 03/12/14 0610  CALCIUM 8.2* 7.9* 7.9*   Sepsis Markers  Recent Labs Lab 03/11/14 1047  LATICACIDVEN 1.4   ABG  Recent Labs Lab 03/08/14 0017 03/10/14 1537  PHART 7.389 7.238*  PCO2ART 39.2 52.1*  PO2ART 63.2* 82.4   Liver Enzymes No results for input(s): AST, ALT, ALKPHOS, BILITOT, ALBUMIN in the last 168 hours. Cardiac Enzymes No results for input(s): TROPONINI, PROBNP in the last 168 hours. Glucose  Recent Labs Lab 03/11/14 0818 03/11/14 1220 03/11/14 1624 03/11/14 2008 03/12/14 0015 03/12/14 0327  GLUCAP 148* 164* 133* 132* 146* 137*   Imaging  2/11 CXR > pending  ASSESSMENT / PLAN:  Respiratory Acute respiratory failure with hypoxemia > due to HCAP, COPD, mass, pulmonary edema? 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  CV Afib with RVR Shock> presumably sedation related, possibly septic shock P:  Neosynephrine considering afib rvr CVP goal 8-12 Wean neo for MAP > 65 Re-bolus amiodarone now D/C amiodarone gtt Cardizem drip ordered Echo noted, normal EF. 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  Will continue vent support.  HR control as above.  Will need involvement of palliative care.  Will need further discussion with family.  The patient is critically ill with multiple organ systems failure and requires high complexity decision making for assessment and support, frequent evaluation and titration of therapies, application of advanced monitoring technologies and extensive interpretation of multiple databases.   Critical Care Time devoted to patient care services described in this note is  35  Minutes. This time reflects time of care of this signee Dr Jennet Maduro. This critical care time does not reflect procedure time, or teaching time or supervisory time of PA/NP/Med student/Med Resident etc but could involve care discussion time.  Rush Farmer, M.D. [facility] Pulmonary/Critical Care Medicine. Pager: [phone]. After hours pager: [phone].

## 2014-03-12 NOTE — Progress Notes (Signed)
During assessment nurse noticed patient abdomen was very taut and distended. Tube feeding were running at 35. Nurse checked residual of tube feed and discovered over 700 of residuals in stomach. Tube feeds stopped. Will notify physician upon arrival. Auscultation of tube noted tube to be in correct position. Will continue to monitor.

## 2014-03-13 ENCOUNTER — Inpatient Hospital Stay (HOSPITAL_COMMUNITY): Payer: Medicare Other

## 2014-03-13 DIAGNOSIS — J9690 Respiratory failure, unspecified, unspecified whether with hypoxia or hypercapnia: Secondary | ICD-10-CM

## 2014-03-13 LAB — BASIC METABOLIC PANEL
Anion gap: 7 (ref 5–15)
BUN: 24 mg/dL — ABNORMAL HIGH (ref 6–23)
CHLORIDE: 109 mmol/L (ref 96–112)
CO2: 21 mmol/L (ref 19–32)
CREATININE: 1.75 mg/dL — AB (ref 0.50–1.35)
Calcium: 8 mg/dL — ABNORMAL LOW (ref 8.4–10.5)
GFR calc Af Amer: 45 mL/min — ABNORMAL LOW (ref >90)
GFR, EST NON AFRICAN AMERICAN: 39 mL/min — AB (ref >90)
Glucose, Bld: 136 mg/dL — ABNORMAL HIGH (ref 70–99)
POTASSIUM: 3.9 mmol/L (ref 3.5–5.1)
SODIUM: 137 mmol/L (ref 135–145)

## 2014-03-13 LAB — CBC
HCT: 27.3 % — ABNORMAL LOW (ref 39.0–52.0)
Hemoglobin: 8.8 g/dL — ABNORMAL LOW (ref 13.0–17.0)
MCH: 26.9 pg (ref 26.0–34.0)
MCHC: 32.2 g/dL (ref 30.0–36.0)
MCV: 83.5 fL (ref 78.0–100.0)
PLATELETS: 542 10*3/uL — AB (ref 150–400)
RBC: 3.27 MIL/uL — ABNORMAL LOW (ref 4.22–5.81)
RDW: 15.9 % — AB (ref 11.5–15.5)
WBC: 34.3 10*3/uL — AB (ref 4.0–10.5)

## 2014-03-13 LAB — GLUCOSE, CAPILLARY
GLUCOSE-CAPILLARY: 136 mg/dL — AB (ref 70–99)
GLUCOSE-CAPILLARY: 150 mg/dL — AB (ref 70–99)
Glucose-Capillary: 133 mg/dL — ABNORMAL HIGH (ref 70–99)
Glucose-Capillary: 113 mg/dL — ABNORMAL HIGH (ref 70–99)
Glucose-Capillary: 130 mg/dL — ABNORMAL HIGH (ref 70–99)
Glucose-Capillary: 137 mg/dL — ABNORMAL HIGH (ref 70–99)

## 2014-03-13 LAB — BLOOD GAS, ARTERIAL
ACID-BASE DEFICIT: 5 mmol/L — AB (ref 0.0–2.0)
BICARBONATE: 19.5 meq/L — AB (ref 20.0–24.0)
Drawn by: 308601
FIO2: 0.9 %
O2 SAT: 93.4 %
PCO2 ART: 36.1 mmHg (ref 35.0–45.0)
PEEP/CPAP: 8 cmH2O
PRESSURE CONTROL: 12 cmH2O
Patient temperature: 37
RATE: 14 resp/min
TCO2: 18.5 mmol/L (ref 0–100)
pH, Arterial: 7.352 (ref 7.350–7.450)
pO2, Arterial: 69.9 mmHg — ABNORMAL LOW (ref 80.0–100.0)

## 2014-03-13 LAB — MAGNESIUM: MAGNESIUM: 1.7 mg/dL (ref 1.5–2.5)

## 2014-03-13 LAB — PHOSPHORUS: PHOSPHORUS: 2.9 mg/dL (ref 2.3–4.6)

## 2014-03-13 MED ORDER — METOPROLOL TARTRATE 1 MG/ML IV SOLN
5.0000 mg | Freq: Once | INTRAVENOUS | Status: AC
  Administered 2014-03-13: 5 mg via INTRAVENOUS
  Filled 2014-03-13: qty 5

## 2014-03-13 MED ORDER — VITAMINS A & D EX OINT
TOPICAL_OINTMENT | CUTANEOUS | Status: AC
  Administered 2014-03-13: 1
  Filled 2014-03-13: qty 5

## 2014-03-13 NOTE — Progress Notes (Signed)
PULMONARY / CRITICAL CARE MEDICINE   Name: [name] [name] MRN: [id number] DOB: 10/06/1945    ADMISSION DATE:  03/03/2014 CONSULTATION DATE:  03/05/14  REFERRING MD :  Alcario Drought  CHIEF COMPLAINT:  hemoptysis  INITIAL PRESENTATION: hemoptysis, LUL cavitary lesion with mediastinal invasion  STUDIES:  CXR: LUL cavitary lseion and mediastinal invasion  SIGNIFICANT EVENTS:   2/9 Bronchoscopy> mucus plugging LUL, mass left upper lobe, biopsy, brushing, washing; biopsy with high grade, poorly differentiated carcinoma 2/11 Intubated  SUBJECTIVE: Intubated 2/11, requiring high sedation and FiO2, Afib with RVR  VITAL SIGNS: Temp:  [98.6 F (37 C)-100 F (37.8 C)] 99.1 F (37.3 C) (02/14 0800) Pulse Rate:  [137-138] 137 (02/14 0400) Resp:  [14-30] 19 (02/14 1000) BP: (63-146)/(32-92) 94/71 mmHg (02/14 1000) SpO2:  [86 %-98 %] 96 % (02/14 1000) FiO2 (%):  [70 %-100 %] 90 % (02/14 0800) Weight:  [64.5 kg (142 lb 3.2 oz)] 64.5 kg (142 lb 3.2 oz) (02/14 0324)   HEMODYNAMICS: CVP:  [9 mmHg-20 mmHg] 14 mmHg   VENTILATOR SETTINGS: Vent Mode:  [-] PCV FiO2 (%):  [70 %-100 %] 90 % Set Rate:  [14 bmp] 14 bmp PEEP:  [8 cmH20] 8 cmH20 Plateau Pressure:  [18 cmH20-22 cmH20] 19 cmH20   INTAKE / OUTPUT:  Intake/Output Summary (Last 24 hours) at 03/13/14 1147 Last data filed at 03/13/14 1019  Gross per 24 hour  Intake 3272.98 ml  Output    725 ml  Net 2547.98 ml   PHYSICAL EXAMINATION: General:  Cachectic male on vent HEENT: [place]/AT, ETT in place PULM: diminished LUL, crackles noted bibasilar CV: Tachy, irreg irreg, JVD noted Ab: BS+, soft,  Ext: warm, no edema Neuro: A&Ox4, more interactive today  LABS:  CBC  Recent Labs Lab 03/10/14 1800 03/12/14 0500 03/13/14 0500  WBC 32.7* 31.6* 34.3*  HGB 8.8* 8.9* 8.8*  HCT 28.0* 27.7* 27.3*  PLT 437* 439* 542*   Coag's No results for input(s): APTT, INR in the last 168 hours. BMET  Recent Labs Lab 03/11/14 1047 03/12/14 0610  03/13/14 0500  NA 138 138 137  K 4.2 4.6 3.9  CL 106 108 109  CO2 22 21 21   BUN 19 20 24*  CREATININE 1.73* 1.86* 1.75*  GLUCOSE 167* 134* 136*   Electrolytes  Recent Labs Lab 03/11/14 1047 03/12/14 0610 03/13/14 0500  CALCIUM 7.9* 7.9* 8.0*  MG  --   --  1.7  PHOS  --   --  2.9   Sepsis Markers  Recent Labs Lab 03/11/14 1047  LATICACIDVEN 1.4   ABG  Recent Labs Lab 03/10/14 1537 03/12/14 1232 03/13/14 0358  PHART 7.238* 7.340* 7.352  PCO2ART 52.1* 39.8 36.1  PO2ART 82.4 64.3* 69.9*   Liver Enzymes No results for input(s): AST, ALT, ALKPHOS, BILITOT, ALBUMIN in the last 168 hours. Cardiac Enzymes No results for input(s): TROPONINI, PROBNP in the last 168 hours. Glucose  Recent Labs Lab 03/12/14 0327 03/12/14 0824 03/12/14 1157 03/12/14 1550 03/12/14 2026 03/13/14 0806  GLUCAP 137* 155* 131* 153* 133* 113*   Imaging  2/11 CXR > pending  ASSESSMENT / PLAN:  Respiratory Acute respiratory failure with hypoxemia > due to HCAP, COPD, mass, pulmonary edema? 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 Plan is for vent no longer than 7 days Would like to diurese but hold for now given hypotension. Will need involvement of palliative care Monday.  CV Afib with RVR Shock> presumably sedation related, possibly septic shock P:  Neosynephrine considering afib rvr CVP goal 8-12 Wean neo for MAP > 65 Re-bolus amiodarone now D/C amiodarone gtt Cardizem drip ordered Lopressor IV ordered as cardizem has been less than effective. Echo noted, normal EF. 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  Will continue vent support.  HR control as above.  Will need involvement of palliative care.  Will need further discussion with family.  The patient is critically ill with multiple organ systems failure and requires high complexity decision making for assessment and support, frequent evaluation and titration of therapies, application of advanced monitoring technologies and extensive interpretation of multiple databases.   Critical Care Time devoted to patient care services described in this note is  35  Minutes. This time reflects time of care of this signee Dr Jennet Maduro. This critical care time does not reflect procedure time, or teaching time or supervisory time of PA/NP/Med student/Med Resident etc but could involve care discussion time.  Rush Farmer, M.D. [facility] Pulmonary/Critical Care Medicine. Pager: [phone]. After hours pager: [phone].

## 2014-03-13 NOTE — Progress Notes (Signed)
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 radiation. I also think evaluation by palliative care is advisable at this point. The patient voices understanding of current disease status and treatment options and is in agreement with the current care plan.  All questions were answered. The patient knows to call the clinic with any problems, questions or concerns. We can certainly see the patient much sooner if necessary.  Thank you so much for allowing me to participate in the care of Rella Larve. I will continue to follow up the patient with you and assist  in his care.  Disclaimer: This note was dictated with voice recognition software. Similar sounding words can inadvertently be transcribed and may not be corrected upon review.   Hind Chesler K. March 13, 2014, 10:14 AM

## 2014-03-14 ENCOUNTER — Ambulatory Visit
Admit: 2014-03-14 | Discharge: 2014-03-14 | Disposition: A | Discharge status: Home / Self Care | Payer: Medicare Other | Attending: Radiation Oncology | Admitting: Radiation Oncology

## 2014-03-14 ENCOUNTER — Inpatient Hospital Stay (HOSPITAL_COMMUNITY): Payer: Medicare Other

## 2014-03-14 DIAGNOSIS — C3412 Malignant neoplasm of upper lobe, left bronchus or lung: Secondary | ICD-10-CM

## 2014-03-14 LAB — BLOOD GAS, ARTERIAL
Acid-base deficit: 5.7 mmol/L — ABNORMAL HIGH (ref 0.0–2.0)
BICARBONATE: 18.8 meq/L — AB (ref 20.0–24.0)
DRAWN BY: 308601
FIO2: 1 %
LHR: 14 {breaths}/min
O2 Saturation: 85.9 %
PCO2 ART: 34.8 mmHg — AB (ref 35.0–45.0)
PEEP: 10 cmH2O
Patient temperature: 37
Pressure control: 12 cmH2O
TCO2: 18 mmol/L (ref 0–100)
pH, Arterial: 7.351 (ref 7.350–7.450)
pO2, Arterial: 54.1 mmHg — ABNORMAL LOW (ref 80.0–100.0)

## 2014-03-14 LAB — GLUCOSE, CAPILLARY
GLUCOSE-CAPILLARY: 101 mg/dL — AB (ref 70–99)
GLUCOSE-CAPILLARY: 108 mg/dL — AB (ref 70–99)
GLUCOSE-CAPILLARY: 159 mg/dL — AB (ref 70–99)
GLUCOSE-CAPILLARY: 75 mg/dL (ref 70–99)
Glucose-Capillary: 90 mg/dL (ref 70–99)
Glucose-Capillary: 98 mg/dL (ref 70–99)
Glucose-Capillary: 89 mg/dL (ref 70–99)

## 2014-03-14 LAB — CULTURE, BLOOD (ROUTINE X 2)
Culture: NO GROWTH
Culture: NO GROWTH

## 2014-03-14 LAB — CBC
HCT: 25.3 % — ABNORMAL LOW (ref 39.0–52.0)
Hemoglobin: 8.3 g/dL — ABNORMAL LOW (ref 13.0–17.0)
MCH: 26.9 pg (ref 26.0–34.0)
MCHC: 32.8 g/dL (ref 30.0–36.0)
MCV: 82.1 fL (ref 78.0–100.0)
PLATELETS: 535 10*3/uL — AB (ref 150–400)
RBC: 3.08 MIL/uL — ABNORMAL LOW (ref 4.22–5.81)
RDW: 15.7 % — ABNORMAL HIGH (ref 11.5–15.5)
WBC: 34 10*3/uL — AB (ref 4.0–10.5)

## 2014-03-14 LAB — BASIC METABOLIC PANEL
ANION GAP: 9 (ref 5–15)
BUN: 26 mg/dL — ABNORMAL HIGH (ref 6–23)
CO2: 21 mmol/L (ref 19–32)
Calcium: 8.1 mg/dL — ABNORMAL LOW (ref 8.4–10.5)
Chloride: 104 mmol/L (ref 96–112)
Creatinine, Ser: 1.65 mg/dL — ABNORMAL HIGH (ref 0.50–1.35)
GFR calc non Af Amer: 42 mL/min — ABNORMAL LOW (ref >90)
GFR, EST AFRICAN AMERICAN: 48 mL/min — AB (ref >90)
Glucose, Bld: 100 mg/dL — ABNORMAL HIGH (ref 70–99)
POTASSIUM: 3.7 mmol/L (ref 3.5–5.1)
Sodium: 134 mmol/L — ABNORMAL LOW (ref 135–145)

## 2014-03-14 LAB — PHOSPHORUS: Phosphorus: 2.9 mg/dL (ref 2.3–4.6)

## 2014-03-14 LAB — MAGNESIUM: Magnesium: 1.8 mg/dL (ref 1.5–2.5)

## 2014-03-14 MED ORDER — VITAL AF 1.2 CAL PO LIQD
1000.0000 mL | ORAL | Status: DC
  Administered 2014-03-14: 1000 mL
  Filled 2014-03-14: qty 1000

## 2014-03-14 MED ORDER — VITAL HIGH PROTEIN PO LIQD
1000.0000 mL | ORAL | Status: DC
Start: 2014-03-14 — End: 2014-03-14
  Filled 2014-03-14: qty 1000

## 2014-03-14 MED ORDER — VITAL HIGH PROTEIN PO LIQD
1000.0000 mL | ORAL | Status: DC
  Filled 2014-03-14: qty 1000

## 2014-03-14 NOTE — Consult Note (Signed)
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 lobe/ hilar mass / consolidation process. No significant interval change. No effusion or pneumothorax evident. Slight tracheal deviation to the right as before.  IMPRESSION: Stable large left upper lobe/hilar mass and background COPD/ emphysema.  No superimposed enlarging effusion or pneumothorax following bronchoscopy.   Electronically Signed   By: Daryll Brod M.D.   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  EXAM: PORTABLE CHEST - 1 VIEW  COMPARISON:  Portable chest x-ray of March 07, 2014  FINDINGS: The right lung remains well-expanded. There is stable right apical pleural thickening and stable increased interstitial density in the lung. On the left there is increasing density in the upper 1/2 of the hemi thorax with mild displacement of the trachea toward the right. There remains a small amount of aerated right lower lung. There is partial obscuration of the hemidiaphragm. The cardiac silhouette is normal in size. The pulmonary vascularity is not engorged. The observed bony thorax is unremarkable.   IMPRESSION: 1. Increasing opacification of the right mid and upper hemi thorax with evidence of mild displacement of the trachea toward the right. This is consistent with progressive atelectasis and/or pneumonia. There is a known left upper lobe mass. 2. Stable mildly increased interstitial density in the right lung.   Electronically Signed   By: David  Martinique   On: 03/09/2014 07:41      IMPRESSION: Mr. 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 He's had previous right-sided lung cancer causing some decrease in overall lung capacity. With obstruction of the left upper lung, the patient is in respiratory failure and dependent on ventilator support. At this point, the patient is unlikely to improve and less his primary tumor and lymphadenopathy can be treated.  The patient would be a candidate for localized palliative radiotherapy to the central mediastinum to help open airways and provide some possibility to be weaned from the ventilator.  PLAN:  I would recommend that we proceed with urgent radiotherapy to the chest with palliative intent.  I spent 15 minutes minutes face to face with the patient and more than 50% of that time was spent in counseling and/or coordination of care.   ------------------------------------------------  Sheral Apley. Tammi Klippel, M.D.

## 2014-03-14 NOTE — Progress Notes (Signed)
CARE MANAGEMENT NOTE 03/14/2014  Patient:  [name] [name],[name] [name]   Account Number:  [id number]  Date Initiated:  03/27/2014  Documentation initiated by:  Dessa Phi  Subjective/Objective Assessment:   69 y/o m admitted w/Lung mass.TY:OMAY Ca.     Action/Plan:   From home.   Anticipated DC Date:  03/17/2014   Anticipated DC Plan:  HOME W HOSPICE CARE  In-house referral  Hospice / [facility]  CM consult      Choice offered to / List presented to:             Status of service:  In process, will continue to follow Medicare Important Message given?  YES (If response is "NO", the following Medicare IM given date fields will be blank) Date Medicare IM given:  02/28/2014 Medicare IM given by:  [facility] Date Additional Medicare IM given:  03/09/2014 Additional Medicare IM given by:  [facility] Arriel Victor  Discharge Disposition:    Per UR Regulation:  Reviewed for med. necessity/level of care/duration of stay  If discussed at [place] of Stay Meetings, dates discussed:    Comments:  Feb. 15 2016/Juanisha Bautch L. Rosana Hoes, RN, BSN, CCM/Case Management [place] [phone] No discharge needs present of time of review.   Feb. 12 2016/Santo Zahradnik L. Rosana Hoes, RN, BSN, CCM/Case Management [place] [phone] No discharge needs present of time of review. patient requuired intubation pm of [phone].  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  S/P bronchoscopy on 2/8, then developed HCAP vs aspiration 2/9 early AM.  Treat as HCAP, f/u bronch and cultures.  03/24/2014 Dessa Phi RN BSN NCM [phone] Bronchoscopy today.Noted may d/c today post procedure if stableNo anticipated d/c needs.

## 2014-03-14 NOTE — Progress Notes (Signed)
PULMONARY / CRITICAL CARE MEDICINE   Name: [name] [name] MRN: [id number] DOB: Dec 22, 1945    ADMISSION DATE:  03/05/2014 CONSULTATION DATE:  03/05/14  REFERRING MD :  Dr. Alcario Drought / [facility]   CHIEF COMPLAINT:  hemoptysis  INITIAL PRESENTATION: 69 y/o M with known enlarging mass in LUL (Rx for aspergillus disease) admitted to [facility] on 2/6 with hemoptysis, LUL cavitary lesion with mediastinal invasion.  STUDIES:  2/05  CXR >> LUL cavitary lseion and mediastinal invasion  SIGNIFICANT EVENTS:   2/06  Admitted with hemoptysis 2/08  PCCM consulted for evaluation 2/09  Bronchoscopy> mucus plugging LUL, mass left upper lobe, biopsy, brushing, washing; biopsy with high grade, poorly differentiated carcinoma 2/11  Intubated 2/13  High residuals, TF held   SUBJECTIVE: RN reports pt off neo this am, TF not restarted since 2/13.  Agitation reduced.  HR improved.    VITAL SIGNS: Temp:  [98.1 F (36.7 C)-100.7 F (38.2 C)] 100.7 F (38.2 C) (02/15 0800) Pulse Rate:  [83-88] 83 (02/15 0300) Resp:  [15-34] 15 (02/15 0600) BP: (76-157)/(48-103) 104/61 mmHg (02/15 0600) SpO2:  [87 %-99 %] 98 % (02/15 0600) FiO2 (%):  [90 %-100 %] 100 % (02/15 0728) Weight:  [145 lb 1 oz (65.8 kg)] 145 lb 1 oz (65.8 kg) (02/15 0400)   HEMODYNAMICS: CVP:  [9 mmHg-15 mmHg] 9 mmHg   VENTILATOR SETTINGS: Vent Mode:  [-] PCV FiO2 (%):  [90 %-100 %] 100 % Set Rate:  [14 bmp] 14 bmp PEEP:  [8 cmH20-14 cmH20] 12 cmH20 Plateau Pressure:  [16 cmH20-25 cmH20] 21 cmH20   INTAKE / OUTPUT:  Intake/Output Summary (Last 24 hours) at 03/14/14 0924 Last data filed at 03/14/14 0600  Gross per 24 hour  Intake 1794.16 ml  Output    725 ml  Net 1069.16 ml   PHYSICAL EXAMINATION: General:  Cachectic male on vent HEENT: [place]/AT, ETT in place PULM: diminished LUL, coarse on R, few faint crackles RLL CV: Tachy, irreg irreg, JVD noted Ab: BS+, soft,  Ext: warm, no edema Neuro: A&Ox4, more interactive  today  LABS:  CBC  Recent Labs Lab 03/12/14 0500 03/13/14 0500 03/14/14 0505  WBC 31.6* 34.3* 34.0*  HGB 8.9* 8.8* 8.3*  HCT 27.7* 27.3* 25.3*  PLT 439* 542* 535*   Coag's No results for input(s): APTT, INR in the last 168 hours.   BMET  Recent Labs Lab 03/12/14 0610 03/13/14 0500 03/14/14 0505  NA 138 137 134*  K 4.6 3.9 3.7  CL 108 109 104  CO2 21 21 21   BUN 20 24* 26*  CREATININE 1.86* 1.75* 1.65*  GLUCOSE 134* 136* 100*   Electrolytes  Recent Labs Lab 03/12/14 0610 03/13/14 0500 03/14/14 0505  CALCIUM 7.9* 8.0* 8.1*  MG  --  1.7 1.8  PHOS  --  2.9 2.9   Sepsis Markers  Recent Labs Lab 03/11/14 1047  LATICACIDVEN 1.4   ABG  Recent Labs Lab 03/12/14 1232 03/13/14 0358 03/14/14 0232  PHART 7.340* 7.352 7.351  PCO2ART 39.8 36.1 34.8*  PO2ART 64.3* 69.9* 54.1*   Liver Enzymes No results for input(s): AST, ALT, ALKPHOS, BILITOT, ALBUMIN in the last 168 hours.   Cardiac Enzymes No results for input(s): TROPONINI, PROBNP in the last 168 hours.   Glucose  Recent Labs Lab 03/13/14 0806 03/13/14 1228 03/13/14 1609 03/13/14 2029 03/14/14 0045 03/14/14 0415  GLUCAP 113* 150* 159* 137* 108* 90   Imaging  2/11 CXR > pending  ASSESSMENT / PLAN:  RESPIRATORY:  A: Acute respiratory failure with hypoxemia -  due to HCAP, COPD, mass, pulmonary edema? 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   P:   Heme/onc consulted > consideration for therapy as outpt if stabilizes; no role here for chemo or XRT to facilitate extubation Treat for HCAP, see ID Wean PEEP/FiO2 for saturations > 88% Consider diuresis as he hemodynamically stabilizes. PRN albuterol   CV: A: Afib with RVR - improved on Cardizem gtt (stopped 2/14) Shock - likely sedation related.  ECHO with normal  EF.   P:  Neosynephrine with AFib, wean for MAP >65 CVP Q4, goal 8-12 ICU monitoring PRN lopressor for HR > 110  RENAL: A: CKD Stage 3 - improving sr cr Hypomagnesemia  P:   Monitor BMET and UOP Replace electrolytes as needed  HEMATOLOGY:  A: Chronic anemia, not bleeding P:  Monitor CBC Heparin SQ for DVT  ID: A: Possible invasive aspergillus with possible superimposed abscess HCAP  Hx mycobacterial disease (M gordonae) P:   BCx2 2/6 >> negative BCx2 2/9 >> neg Resp 2/6 >> OPF BAL 2/8 >> OPF BAL fungal 2/8 >> yeast  Abx:  Voriconazole (home med, cont indefinitely) > Vanc 2/9 >> Zosyn 2/9 >>   GI:  A: Severe Protein Calorie Malnutrition  High Gastric Residuals P:  TF 2/15 at 10 ml/hr and monitor residual  PPI Consider reglan if continues to have increased residuals  NEURO:  A: Pain - ICU associated discomfort P:  Fentanyl gtt for pain, max to 200 mcg as has been going on neo at night with up titration, down during day without agitation  PRN Versed for sedation  Goal RASS:  -2 Seroquel 25 mg QD  ENDOCRINE:  A: Mild Hyperglycemia  P: Monitor glucose on BMP, if consistently greater than 180 add SSI   GLOBAL: Patient with grim prognosis, consult palliative care for assistance with goals of care.  In the past, the patient has been reluctant to discuss.    Noe Gens, NP-C [facility] Pgr: [phone] or [phone]  Attending Note:  I have examined patient, reviewed labs, studies and notes. I have discussed the case with B Ollis, and I agree with the data and plans as amended above. Pt is sedated and ventilated. Newly dx LUL poorly differentiated lung CA. Being treated for HCAP and for known fungal disease. Agree with palliative Care consultation to help with goals given his poor prognosis. Independent critical care time is 40 minutes.   Baltazar Apo, MD, PhD 03/14/2014, 10:47 AM  Pulmonary and Critical Care [phone] or  if no answer [phone]

## 2014-03-14 NOTE — Progress Notes (Signed)
  Radiation Oncology         [phone] ________________________________  Name: [name] [name] MRN: [id number]  Date: 03/24/2014  DOB: [birth year]  SIMULATION AND TREATMENT PLANNING NOTE    ICD-9-CM ICD-10-CM   1. Hemoptysis 786.30 R04.2 DG Chest 2 View     DG Chest 2 View  2. Verbally abusive behavior, initial encounter 995.82 T74.31XA   3. 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 Hypoxia 799.02 R09.02   5. S/P biopsy V45.89 Z98.89 DG Chest [facility] 1 View     DG Chest [place] 1 View  6. S/P bronchoscopy with biopsy V45.89 Z98.89 CANCELED: DG C-ARM BRONCHOSCOPY     CANCELED: DG C-ARM BRONCHOSCOPY  7. Respiratory distress 786.09 R06.00 DG Chest [place] 1 View     DG Chest [place] 1 View  8. Acute respiratory failure with hypoxia 518.81 J96.01 DG Chest [facility] 1 View     DG Chest [place] 1 View     DG Chest [place] 1 View     DG Chest [place] 1 View     DG Chest [place] 1 View     DG Chest [place] 1 View  9. Acute respiratory failure with hypoxemia 518.81 J96.01 Portable Chest xray     Portable Chest xray     Portable Chest xray     Portable Chest xray     DG Chest [facility] 1 View     DG Chest [place] 1 View  10. History of ETT V15.89 Z92.89 DG Chest [facility] 1 View     DG Chest [place] 1 View     DG Chest [place] 1 View     DG Chest [place] 1 View  11. Acute respiratory failure 518.81 J96.00 DG Chest [place] 1 View     DG Chest [place] 1 View    DIAGNOSIS:  69 year old gentleman with poorly differentiated carcinoma of the left upper lobe lung experiencing respiratory failure  NARRATIVE:  The patient was brought to the CT Simulation planning suite on the ventilator with the support of his ICU nursing staff and respiratory therapy .  Identity was confirmed.  All relevant records and images related to the planned course of therapy were reviewed.  The patient was unable to personally provide informed written consent for radiotherapy and this will be sought through his next of kin given the urgent nature of this  treatment. Then, the patient was set-up in a stable reproducible  supine position for radiation therapy.  CT images were obtained.  Surface markings were placed.  The CT images were loaded into the planning software.  Then the target and avoidance structures were contoured.  Treatment planning then occurred.  The radiation prescription was entered and confirmed.  Then, I designed and supervised the construction of a total of 2 medically necessary complex treatment devices consisting of multileaf collimators shaping radiation conformally around the patient's primary tumor in the left upper lung and central mediastinum while shielding the heart uninvolved lungs and spinal cord maximally..  I have requested : Isodose Plan.   PLAN:  The patient will receive 30 Gy in 10 fractions.  ________________________________  Sheral Apley Tammi Klippel, M.D.

## 2014-03-14 NOTE — Progress Notes (Addendum)
NUTRITION FOLLOW UP  Intervention:   - Vital AF 1.2 at 10 mL/hr trickle feeds per MD and monitor residuals. MD to consider Reglan if continues to have increased residuals.   - If pt tolerates TF, advancement per MD to goal rate of 65 mL/hr to provide 1872 kcal (102% of estimated energy needs), 117 g protein, and 1265 mL of H2O.  - RD will continue to monitor.   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 Past medical history of pancreatitis, HTN, peptic ulcer, alcohol abuse, GERD. Admitted to the ED 2/6 with new onset of hemoptysis.  2/9-bronchoscopy- mucus plugging LUL, mass left upper lobe, biopsy, brushing, washing; biopsy with high grade, poorly differentiated carcinoma   2/11- Intubated  - TF held 2/13 due to high residuals. Per RN, pt's stomach became distended and pt with 800 mL residuals when TF running at 30 mL/hr.  - Consult received for TF initiation and management.  - Per MD note, trickle feeds of 10 mL/hr and assess for residuals. MD to add Reglan if residuals still high.  - Pt with poor prognosis. Palliative care consulted to discuss [place].  - Labs reviewed  Patient is currently intubated on ventilator support MV: 12.3 L/min Temp (24hrs), Avg:99 F (37.2 C), Min:98.1 F (36.7 C), Max:100.7 F (38.2 C)  Propofol: none  Height: Ht Readings from Last 1 Encounters:  03/05/14 5' 6"  (1.676 m)    Weight Status:   Wt Readings from Last 1 Encounters:  03/14/14 145 lb 1 oz (65.8 kg)    Re-estimated needs:  Kcal: 1875 Protein: 100-115 g Fluid: Per MD  Skin: Intact  Diet Order: Diet NPO time  specified   Intake/Output Summary (Last 24 hours) at 03/14/14 1149 Last data filed at 03/14/14 1000  Gross per 24 hour  Intake 1764.53 ml  Output    725 ml  Net 1039.53 ml    Last BM: 2/13   Labs:   Recent Labs Lab 03/12/14 0610 03/13/14 0500 03/14/14 0505  NA 138 137 134*  K 4.6 3.9 3.7  CL 108 109 104  CO2 21 21 21   BUN 20 24* 26*  CREATININE 1.86* 1.75* 1.65*  CALCIUM 7.9* 8.0* 8.1*  MG  --  1.7 1.8  PHOS  --  2.9 2.9  GLUCOSE 134* 136* 100*    CBG (last 3)   Recent Labs  03/13/14 2029 03/14/14 0045 03/14/14 0415  GLUCAP 137* 108* 90    Scheduled Meds: . antiseptic oral rinse  7 mL Mouth Rinse QID  . chlorhexidine  15 mL Mouth Rinse BID  . feeding supplement (VITAL HIGH PROTEIN)  1,000 mL Per Tube Q24H  . heparin subcutaneous  5,000 Units Subcutaneous 3 times per day  . latanoprost  1 drop Both Eyes QHS  . pantoprazole (PROTONIX) IV  40 mg Intravenous Q12H  . piperacillin-tazobactam (ZOSYN)  IV  3.375 g Intravenous 3 times per day  . QUEtiapine  25 mg Oral Daily  . valACYclovir  1,000 mg Per Tube Daily  . vancomycin  500 mg Intravenous Q12H  . voriconazole  200 mg  Per Tube Q12H    Continuous Infusions: . sodium chloride 500 mL (03/11/14 2000)  . sodium chloride Stopped (03/12/14 1900)  . diltiazem (CARDIZEM) infusion Stopped (03/13/14 1930)  . fentaNYL infusion INTRAVENOUS 200 mcg/hr (03/14/14 1011)  . phenylephrine (NEO-SYNEPHRINE) Adult infusion Stopped (03/14/14 0800)    Laurette Schimke MS, RD, LDN

## 2014-03-14 NOTE — Progress Notes (Signed)
ANTIBIOTIC CONSULT NOTE - FOLLOW UP  Pharmacy Consult for Vancomycin, Zosyn, Antibiotic renal dose adjustment Indication: HCAP  Patient Measurements: Height: 5\' 6"  (167.6 cm) Weight: 145 lb 1 oz (65.8 kg) IBW/kg (Calculated) : 63.8  Vital Signs: Temp: 100.7 F (38.2 C) (02/15 0800) Temp Source: Oral (02/15 0800) BP: 126/73 mmHg (02/15 0900) Pulse Rate: 83 (02/15 0300) Intake/Output from previous day: 02/14 0701 - 02/15 0700 In: 2174.2 [I.V.:1694.2; NG/GT:130; IV Piggyback:350] Out: 725 [Urine:725]  Labs:  Recent Labs  03/12/14 0500 03/12/14 0610 03/13/14 0500 03/14/14 0505  WBC 31.6*  --  34.3* 34.0*  HGB 8.9*  --  8.8* 8.3*  PLT 439*  --  542* 535*  CREATININE  --  1.86* 1.75* 1.65*   Estimated Creatinine Clearance: 39.7 mL/min (by C-G formula based on Cr of 1.65).  Recent Labs  03/11/14 1047  [place] 16.8      Assessment: 25 yoM admitted 2/5 with hemoptysis and progressing LUL cavitary lesion. 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 He also recently completed a 7 day course of outpt levaquin (1/21). Concerning for either worsening invasive aspergillus vs malignancy. PCCM consulted, repeat bronchoscopy done 2/8.  Pharmacy is consulted to dose vancomycin and Zosyn on 2/9.    (PTA) 2/6 >> voriconazole >> (PTA) 2/6 >> valacyclovir >>  2/9 >> Vanc >> 2/9 >> Zosyn >>  Today, 03/14/2014:   Day # 7 Vancomycin and Zosyn  Tmax: 100.7  WBCs: remains elevated, 34  Renal: CKD III, SCr improved to 1.65, CrCl ~ 40 ml/min  Last vancomycin trough level 16.8 (03/11/14) was therapeutic.   Goal of Therapy:  Vancomycin trough level 15-20 mcg/ml Appropriate abx dosing, eradication of infection.   Plan:   Continue Zosyn 3.375g IV Q8H infused over 4hrs.   Continue Vancomycin 500mg  IV q12h.  Recheck Vanc trough as needed.  Follow up renal fxn, culture results, and clinical course.  Follow up plan for goals  of care.   Gretta Arab PharmD, BCPS Pager [phone] 03/14/2014 10:12 AM

## 2014-03-14 NOTE — Progress Notes (Signed)
Spoke with daughter [name] [name] - she is aware of cancer diagnosis and is very frustrated that the diagnosis took as long as it did.  They have been working him up for fungal disease and TB.  He was treated with fungal coverage & TB ruled out.  She reports he has had 3 biopsies in the last 8 months.  He later developed hemoptysis that led to further work up and diagnosis of cancer.  She recognizes that he has had significant decline with weight loss and decreased appetite.  Known hx of prior lung cancer.  She describes her father as very private.  [name] [name] is the primary caregiver for him, her mother and her step-father (who lives in a nursing facility).  She works 11 hour days and is available by phone at [phone] but can not always answer due to her job demands but will call back if voicemail left.  I discussed that Palliative Care will be seeing him and setting up a meeting with her.  She is open to palliative concept (pain control, dyspnea relief, respecting dignity as primary focus) but also wants to feel like she has given her father a chance for recovery.  She wants to continue current medical care for now.     [name] Gens, NP-C [facility] Pgr: [phone] or [phone]

## 2014-03-15 ENCOUNTER — Other Ambulatory Visit: Payer: Self-pay | Admitting: Radiology

## 2014-03-15 ENCOUNTER — Inpatient Hospital Stay (HOSPITAL_COMMUNITY): Payer: Medicare Other

## 2014-03-15 ENCOUNTER — Ambulatory Visit
Admit: 2014-03-15 | Discharge: 2014-03-15 | Disposition: A | Discharge status: Home / Self Care | Payer: Medicare Other | Attending: Radiation Oncology | Admitting: Radiation Oncology

## 2014-03-15 DIAGNOSIS — Z515 Encounter for palliative care: Secondary | ICD-10-CM

## 2014-03-15 DIAGNOSIS — C3412 Malignant neoplasm of upper lobe, left bronchus or lung: Secondary | ICD-10-CM

## 2014-03-15 DIAGNOSIS — J9620 Acute and chronic respiratory failure, unspecified whether with hypoxia or hypercapnia: Secondary | ICD-10-CM | POA: Insufficient documentation

## 2014-03-15 DIAGNOSIS — J9621 Acute and chronic respiratory failure with hypoxia: Secondary | ICD-10-CM

## 2014-03-15 LAB — BLOOD GAS, ARTERIAL
Acid-base deficit: 6.5 mmol/L — ABNORMAL HIGH (ref 0.0–2.0)
Bicarbonate: 19.3 mEq/L — ABNORMAL LOW (ref 20.0–24.0)
DRAWN BY: 103701
FIO2: 1 %
MECHVT: 510 mL
O2 Saturation: 90 %
PCO2 ART: 43.3 mmHg (ref 35.0–45.0)
PEEP/CPAP: 12 cmH2O
PO2 ART: 67.3 mmHg — AB (ref 80.0–100.0)
Patient temperature: 98.6
RATE: 14 resp/min
TCO2: 19 mmol/L (ref 0–100)
pH, Arterial: 7.273 — ABNORMAL LOW (ref 7.350–7.450)

## 2014-03-15 LAB — CBC
HCT: 21.5 % — ABNORMAL LOW (ref 39.0–52.0)
Hemoglobin: 7.1 g/dL — ABNORMAL LOW (ref 13.0–17.0)
MCH: 27.6 pg (ref 26.0–34.0)
MCHC: 33 g/dL (ref 30.0–36.0)
MCV: 83.7 fL (ref 78.0–100.0)
Platelets: 504 10*3/uL — ABNORMAL HIGH (ref 150–400)
RBC: 2.57 MIL/uL — ABNORMAL LOW (ref 4.22–5.81)
RDW: 15.7 % — AB (ref 11.5–15.5)
WBC: 32.1 10*3/uL — ABNORMAL HIGH (ref 4.0–10.5)

## 2014-03-15 LAB — GLUCOSE, CAPILLARY
GLUCOSE-CAPILLARY: 99 mg/dL (ref 70–99)
GLUCOSE-CAPILLARY: 112 mg/dL — AB (ref 70–99)
Glucose-Capillary: 105 mg/dL — ABNORMAL HIGH (ref 70–99)
Glucose-Capillary: 90 mg/dL (ref 70–99)
Glucose-Capillary: 126 mg/dL — ABNORMAL HIGH (ref 70–99)
Glucose-Capillary: 94 mg/dL (ref 70–99)

## 2014-03-15 LAB — BASIC METABOLIC PANEL
ANION GAP: 7 (ref 5–15)
BUN: 27 mg/dL — ABNORMAL HIGH (ref 6–23)
CO2: 21 mmol/L (ref 19–32)
Calcium: 8.1 mg/dL — ABNORMAL LOW (ref 8.4–10.5)
Chloride: 110 mmol/L (ref 96–112)
Creatinine, Ser: 1.65 mg/dL — ABNORMAL HIGH (ref 0.50–1.35)
GFR calc non Af Amer: 42 mL/min — ABNORMAL LOW (ref >90)
GFR, EST AFRICAN AMERICAN: 48 mL/min — AB (ref >90)
Glucose, Bld: 93 mg/dL (ref 70–99)
POTASSIUM: 3.5 mmol/L (ref 3.5–5.1)
Sodium: 138 mmol/L (ref 135–145)

## 2014-03-15 LAB — MAGNESIUM: MAGNESIUM: 1.9 mg/dL (ref 1.5–2.5)

## 2014-03-15 MED ORDER — POTASSIUM CHLORIDE 20 MEQ/15ML (10%) PO SOLN
20.0000 meq | Freq: Once | ORAL | Status: AC
  Administered 2014-03-15: 20 meq
  Filled 2014-03-15: qty 15

## 2014-03-15 NOTE — Progress Notes (Signed)
NUTRITION FOLLOW UP  Intervention:   - Continue Vital AF 1.2 at 10 mL/hr trickle feeds per MD and monitor residuals. MD to consider Reglan if continues to have increased residuals. TF provides 288 kcal and 18 g protein.  - If pt tolerates TF, advancement per MD to goal rate of 65 mL/hr to provide 1872 kcal (102% of estimated energy needs), 117 g protein, and 1265 mL of H2O.  - RD will continue to monitor.   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 Past medical history of pancreatitis, HTN, peptic ulcer, alcohol abuse, GERD. Admitted to the ED 2/6 with new onset of hemoptysis.  2/9-bronchoscopy- mucus plugging LUL, mass left upper lobe, biopsy, brushing, washing; biopsy with high grade, poorly differentiated carcinoma   2/11- Intubated  - TF held 2/13 due to high residuals. Per RN, pt's stomach became distended and pt with 800 mL residuals when TF running at 30 mL/hr.  - Consult received for TF initiation and management.  - Per MD note, trickle feeds of 10 mL/hr and assess for residuals. MD to add Reglan if residuals still high.  - Pt with poor prognosis. Palliative care consulted to discuss [place].    2/16: - Per RN, pt's with little residuals, but stomach still appears distended.  - Palliative care consulted for [place].  - Pt agitated during RD visit.  - Recommend continuing trickle feeds.  - Labs reviewed.   Height: Ht Readings from Last 1 Encounters:  03/05/14 5' 6"  (1.676 m)    Weight Status:   Wt Readings from Last 1 Encounters:  03/15/14 144 lb 6.4 oz (65.5 kg)    Re-estimated needs:  Kcal:  1875 Protein: 100-115 g Fluid: Per MD  Skin: Intact  Diet Order: Diet NPO time specified   Intake/Output Summary (Last 24 hours) at 03/15/14 1132 Last data filed at 03/15/14 1000  Gross per 24 hour  Intake 1730.97 ml  Output   1040 ml  Net 690.97 ml    Last BM: 2/13   Labs:   Recent Labs Lab 03/13/14 0500 03/14/14 0505 03/15/14 0511  NA 137 134* 138  K 3.9 3.7 3.5  CL 109 104 110  CO2 21 21 21   BUN 24* 26* 27*  CREATININE 1.75* 1.65* 1.65*  CALCIUM 8.0* 8.1* 8.1*  MG 1.7 1.8 1.9  PHOS 2.9 2.9  --   GLUCOSE 136* 100* 93    CBG (last 3)   Recent Labs  03/15/14 0030 03/15/14 0430 03/15/14 0739  GLUCAP 105* 99 90    Scheduled Meds: . antiseptic oral rinse  7 mL Mouth Rinse QID  . chlorhexidine  15 mL Mouth Rinse BID  . heparin subcutaneous  5,000 Units Subcutaneous 3 times per day  . latanoprost  1 drop Both Eyes QHS  . pantoprazole (PROTONIX) IV  40 mg Intravenous Q12H  . piperacillin-tazobactam (ZOSYN)  IV  3.375 g Intravenous 3 times per day  . QUEtiapine  25 mg Oral Daily  . valACYclovir  1,000 mg Per Tube Daily  . vancomycin  500 mg Intravenous Q12H  . voriconazole  200  mg Per Tube Q12H    Continuous Infusions: . sodium chloride 10 mL/hr at 03/14/14 1725  . sodium chloride 10 mL/hr at 03/14/14 1725  . diltiazem (CARDIZEM) infusion Stopped (03/13/14 1930)  . feeding supplement (VITAL AF 1.2 CAL) 1,000 mL (03/14/14 1727)  . fentaNYL infusion INTRAVENOUS 250 mcg/hr (03/15/14 1049)  . phenylephrine (NEO-SYNEPHRINE) Adult infusion 110 mcg/min (03/15/14 0700)    Laurette Schimke MS, RD, LDN

## 2014-03-15 NOTE — Progress Notes (Signed)
eLink Physician-Brief Progress Note Patient Name: [name] [name] DOB: 09-Oct-1945 MRN: [id number]   Date of Service  03/15/2014  HPI/Events of Note  Worsening hypoxemia PEEP has been increased to 14  eICU Interventions  Advised RT this isn't ARDS, would focus treatment on pulm toilette and mucus plugging, don't go up on PEEP at this time Check CXR  Bag lavage for mucus plugging     Intervention Category Major Interventions: Hypoxemia - evaluation and management  Mikayla Chiusano 03/15/2014, 1:44 AM

## 2014-03-15 NOTE — Progress Notes (Signed)
Wasted Fentanyl from bag 55 mL witnessed by Buren Kos, RN.

## 2014-03-15 NOTE — Progress Notes (Signed)
Pt transported to radiation oncology for tx. Transported on vent with 100% Fio2.

## 2014-03-15 NOTE — Progress Notes (Signed)
PULMONARY / CRITICAL CARE MEDICINE   Name: [name] [name] MRN: [id number] DOB: 12-Sep-1945    ADMISSION DATE:  03/17/2014 CONSULTATION DATE:  03/05/14  REFERRING MD :  Dr. Alcario Drought / [facility]   CHIEF COMPLAINT:  hemoptysis  INITIAL PRESENTATION: 69 y/o M with known enlarging mass in LUL (Rx for aspergillus disease) admitted to [facility] on 2/6 with hemoptysis, LUL cavitary lesion with mediastinal invasion.  STUDIES:  2/05  CXR >> LUL cavitary lseion and mediastinal invasion  SIGNIFICANT EVENTS:   2/06  Admitted with hemoptysis 2/08  PCCM consulted for evaluation 2/09  Bronchoscopy> mucus plugging LUL, mass left upper lobe, biopsy, brushing, washing; biopsy with high grade, poorly differentiated carcinoma 2/11  Intubated 2/13  High residuals, TF held 2/15  Radiation mapping completed 2/16  On neo 110 mcg, fent 228mcg, did not tolerate PEEP 10/80%     SUBJECTIVE: RN reports pt on neo at 110 mcg, fentanyl at 200 mcg, peep increased overnight to 14/100%  VITAL SIGNS: Temp:  [98.3 F (36.8 C)-100.8 F (38.2 C)] 98.4 F (36.9 C) (02/16 0800) Pulse Rate:  [94-107] 102 (02/16 0351) Resp:  [13-31] 28 (02/16 1100) BP: (65-173)/(40-71) 139/62 mmHg (02/16 1100) SpO2:  [77 %-100 %] 86 % (02/16 1100) FiO2 (%):  [80 %-100 %] 80 % (02/16 1100) Weight:  [144 lb 6.4 oz (65.5 kg)] 144 lb 6.4 oz (65.5 kg) (02/16 0351)   HEMODYNAMICS: CVP:  [6 mmHg-16 mmHg] 16 mmHg   VENTILATOR SETTINGS: Vent Mode:  [-] PCV FiO2 (%):  [80 %-100 %] 80 % Set Rate:  [14 bmp] 14 bmp PEEP:  [12 cmH20-14 cmH20] 14 cmH20 Plateau Pressure:  [22 cmH20-26 cmH20] 25 cmH20   INTAKE / OUTPUT:  Intake/Output Summary (Last 24 hours) at 03/15/14 1215 Last data filed at 03/15/14 1100  Gross per 24 hour  Intake 1700.97 ml  Output   1015 ml  Net 685.97 ml   PHYSICAL EXAMINATION: General:  Cachectic male on vent HEENT: [place]/AT, ETT in place PULM: diminished LUL, coarse on R, coarse on R CV: s1s2, irreg irreg, PAC's noted on  monitor, underlying SR, JVD noted Ab: BS+, soft,  Ext: warm, no edema Neuro: A&Ox4, more interactive today  LABS:  CBC  Recent Labs Lab 03/13/14 0500 03/14/14 0505 03/15/14 0511  WBC 34.3* 34.0* 32.1*  HGB 8.8* 8.3* 7.1*  HCT 27.3* 25.3* 21.5*  PLT 542* 535* 504*   Coag's No results for input(s): APTT, INR in the last 168 hours.   BMET  Recent Labs Lab 03/13/14 0500 03/14/14 0505 03/15/14 0511  NA 137 134* 138  K 3.9 3.7 3.5  CL 109 104 110  CO2 21 21 21   BUN 24* 26* 27*  CREATININE 1.75* 1.65* 1.65*  GLUCOSE 136* 100* 93   Electrolytes  Recent Labs Lab 03/13/14 0500 03/14/14 0505 03/15/14 0511  CALCIUM 8.0* 8.1* 8.1*  MG 1.7 1.8 1.9  PHOS 2.9 2.9  --    Sepsis Markers  Recent Labs Lab 03/11/14 1047  LATICACIDVEN 1.4   ABG  Recent Labs Lab 03/12/14 1232 03/13/14 0358 03/14/14 0232  PHART 7.340* 7.352 7.351  PCO2ART 39.8 36.1 34.8*  PO2ART 64.3* 69.9* 54.1*   Liver Enzymes No results for input(s): AST, ALT, ALKPHOS, BILITOT, ALBUMIN in the last 168 hours.   Cardiac Enzymes No results for input(s): TROPONINI, PROBNP in the last 168 hours.   Glucose  Recent Labs Lab 03/14/14 1715 03/14/14 1934 03/15/14 0030 03/15/14 0430 03/15/14 0739 03/15/14 1131  GLUCAP 75 89  105* 99 90 112*   Imaging  2/11 CXR > pending  ASSESSMENT / PLAN:  RESPIRATORY:  A: Acute respiratory failure with hypoxemia -  due to HCAP, COPD, mass, pulmonary edema. 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   P:   Heme/onc consulted >> proceeding with radiation, another treatment 2/16 Treat for HCAP, see ID Wean PEEP/FiO2 for saturations > 88%, suspect that intrathoracic pressures are impacting BP Consider diuresis as he hemodynamically stabilizes. PRN albuterol   CV: A: Afib with  RVR - improved on Cardizem gtt (stopped 2/14) Shock - likely sedation related.  ECHO with normal EF.   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  P:  Advance TF to goal rate, monitor for tolerance PPI Consider reglan if continues to have increased residuals  NEURO:  A: Pain - ICU associated discomfort P:  Fentanyl gtt for pain, max to 200 mcg  PRN Versed for sedation  Goal RASS:  -2 Seroquel 25 mg QD  ENDOCRINE:  A: Mild Hyperglycemia  P: Monitor glucose on BMP, if consistently greater than 180 add SSI   GLOBAL: Pending palliative care discussion for goals of care, poor prognosis.  Daughter Mariann Laster aware of pending consult.     Noe Gens, NP-C [facility] Pgr: [phone] or [phone]   Attending Note:  I have examined patient, reviewed labs, studies and notes. I have discussed the case with B Ollis, and I agree with the data and plans as amended above. VDRF due to LUL mass, post-obstructive process. Has been difficulty to oxygenate. BP has been more labile as PEEP has been increased. Very little air movement on L on exam. Planning for XRT 2/16. Unfortunately suspect  that he will not extubate. Will follow for improvement on XRT, discuss goals and continued ventilation over next several days.  35 minutes Independent CC time  Baltazar Apo, MD, PhD 03/15/2014, 12:48 PM [facility] [phone] or if no answer [phone]

## 2014-03-15 NOTE — Progress Notes (Signed)
[facility] Therapy Treatment Record Phone [phone]   Radiation Therapy was administered to [name] [name] on: 03/15/2014  12:59 PM and was treatment # 1 out of a planned course of 10 treatments.

## 2014-03-15 NOTE — Progress Notes (Addendum)
Called to bedside by RN twice regarding air leak around ett.  Last cxr reviewed showed ett 5.3cm above carina.  Therefore, ett advanced 2 cm per RT protocol from 24 to 26cm lip and leak stopped.  Dr Lake Bells notified and ok to recheck tube placement with morning cxr.

## 2014-03-15 NOTE — Progress Notes (Signed)
eLink Physician-Brief Progress Note Patient Name: [name] [name] DOB: [birth year] MRN: [id number]   Date of Service  03/15/2014  HPI/Events of Note  He is air trapping badly but airway and systemic pressures are acceptable  eICU Interventions  Check cxr for ET placement / accept mild acidosis      Intervention Category Major Interventions: Respiratory failure - evaluation and management  Christinia Gully 03/15/2014, 6:23 PM

## 2014-03-15 NOTE — Progress Notes (Signed)
Spoke with Dr Lake Bells in [place].  MD notified that RT was called to pt bedside for O2 sats in 70s.  Upon arrival to pt bedside, spo2 83%.  Pt already on 100% fio2, +12 peep.  Lung recruitment done x1 for 2 minutes, which pt tolerated fairly well.   O2 sats improved into 90s but HR did increase by 15-20 beats.  Pt also suctioned by RN prior to recruitment maneuver for copious amount of thick tan secretions.  Peep also increased to +14 per md rx.  O2 sats began to drop back into mid to high 80s, now 87%.  Per MD, hold off on increasing peep anymore past +14.  Per MD, continue pulmonary toilet.  MD ok with o2 sats greater than 83%.  RN aware.
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
## 2014-03-15 NOTE — Progress Notes (Signed)
RT noticed ETT out more than normal. Checked placement and ETT was 20cm at the lip. Correct placement was 24 @ lip. Pushed ETT in 4cm and notified Dr. Melvyn Novas. CXR was ordered to check tube placement. Good BBS's and good Vt return.

## 2014-03-15 NOTE — Progress Notes (Signed)
eLink Physician-Brief Progress Note Patient Name: [name] [name] DOB: [birth year] MRN: [id number]   Date of Service  03/15/2014  HPI/Events of Note  Called by RN because patient is awake, uncomfortable despite versed Fentanyl gtt lowered today due to low BP readings in previous notes Patient/situation well known to me  eICU Interventions  Would increase fentanyl gtt as our primary goal should be to keep him comfortable, don't think he will survive this     Intervention Category Intermediate Interventions: Pain - evaluation and management  Jermiah Howton 03/15/2014, 12:21 AM

## 2014-03-15 NOTE — Progress Notes (Signed)
  Radiation Oncology         [phone]) [phone] ________________________________  Name: Roc Streett MRN: [id number]  Date: 03/15/2014  DOB: [birth year]  Simulation Verification Note   Status: inpatient  NARRATIVE: The patient was brought to the treatment unit and placed in the planned treatment position. The clinical setup was verified. Then port films were obtained and uploaded to the radiation oncology medical record software.  The treatment beams were carefully compared against the planned radiation fields. The position location and shape of the radiation fields was reviewed. They targeted volume of tissue appears to be appropriately covered by the radiation beams. Organs at risk appear to be excluded as planned.  Based on my personal review, I approved the simulation verification. The patient's treatment will proceed as planned.  -----------------------------------  Blair Promise, PhD, MD

## 2014-03-16 ENCOUNTER — Ambulatory Visit
Admit: 2014-03-16 | Discharge: 2014-03-16 | Disposition: A | Discharge status: Home / Self Care | Payer: Medicare Other | Attending: Radiation Oncology | Admitting: Radiation Oncology

## 2014-03-16 ENCOUNTER — Inpatient Hospital Stay (HOSPITAL_COMMUNITY): Payer: Medicare Other

## 2014-03-16 DIAGNOSIS — J9620 Acute and chronic respiratory failure, unspecified whether with hypoxia or hypercapnia: Secondary | ICD-10-CM

## 2014-03-16 DIAGNOSIS — Z515 Encounter for palliative care: Secondary | ICD-10-CM

## 2014-03-16 DIAGNOSIS — Z7189 Other specified counseling: Secondary | ICD-10-CM

## 2014-03-16 LAB — CBC
HEMATOCRIT: 24.7 % — AB (ref 39.0–52.0)
HEMOGLOBIN: 8 g/dL — AB (ref 13.0–17.0)
MCH: 27.3 pg (ref 26.0–34.0)
MCHC: 32.4 g/dL (ref 30.0–36.0)
MCV: 84.3 fL (ref 78.0–100.0)
Platelets: 642 10*3/uL — ABNORMAL HIGH (ref 150–400)
RBC: 2.93 MIL/uL — ABNORMAL LOW (ref 4.22–5.81)
RDW: 16.2 % — ABNORMAL HIGH (ref 11.5–15.5)
WBC: 39.6 10*3/uL — AB (ref 4.0–10.5)

## 2014-03-16 LAB — MAGNESIUM: MAGNESIUM: 1.8 mg/dL (ref 1.5–2.5)

## 2014-03-16 LAB — BLOOD GAS, ARTERIAL
ACID-BASE DEFICIT: 5.5 mmol/L — AB (ref 0.0–2.0)
Bicarbonate: 20.8 mEq/L (ref 20.0–24.0)
Drawn by: 257701
FIO2: 1 %
MECHVT: 510 mL
O2 Saturation: 84.9 %
PATIENT TEMPERATURE: 98.6
PCO2 ART: 47.7 mmHg — AB (ref 35.0–45.0)
PEEP/CPAP: 14 cmH2O
PH ART: 7.264 — AB (ref 7.350–7.450)
RATE: 14 resp/min
TCO2: 20.1 mmol/L (ref 0–100)
pO2, Arterial: 57.3 mmHg — ABNORMAL LOW (ref 80.0–100.0)

## 2014-03-16 LAB — BASIC METABOLIC PANEL
ANION GAP: 4 — AB (ref 5–15)
BUN: 26 mg/dL — ABNORMAL HIGH (ref 6–23)
CALCIUM: 8.1 mg/dL — AB (ref 8.4–10.5)
CO2: 22 mmol/L (ref 19–32)
Chloride: 107 mmol/L (ref 96–112)
Creatinine, Ser: 1.64 mg/dL — ABNORMAL HIGH (ref 0.50–1.35)
GFR calc non Af Amer: 42 mL/min — ABNORMAL LOW (ref >90)
GFR, EST AFRICAN AMERICAN: 49 mL/min — AB (ref >90)
Glucose, Bld: 120 mg/dL — ABNORMAL HIGH (ref 70–99)
Potassium: 3.6 mmol/L (ref 3.5–5.1)
Sodium: 133 mmol/L — ABNORMAL LOW (ref 135–145)

## 2014-03-16 LAB — GLUCOSE, CAPILLARY
GLUCOSE-CAPILLARY: 110 mg/dL — AB (ref 70–99)
Glucose-Capillary: 99 mg/dL (ref 70–99)
Glucose-Capillary: 111 mg/dL — ABNORMAL HIGH (ref 70–99)
Glucose-Capillary: 128 mg/dL — ABNORMAL HIGH (ref 70–99)

## 2014-03-16 LAB — CORTISOL: Cortisol, Plasma: 30.4 ug/dL

## 2014-03-16 MED ORDER — METOCLOPRAMIDE HCL 5 MG/ML IJ SOLN
5.0000 mg | Freq: Three times a day (TID) | INTRAMUSCULAR | Status: AC
  Administered 2014-03-16 – 2014-03-17 (×3): 5 mg via INTRAVENOUS
  Filled 2014-03-16 (×3): qty 2

## 2014-03-16 MED ORDER — VITAL AF 1.2 CAL PO LIQD
1000.0000 mL | ORAL | Status: DC
  Administered 2014-03-16 – 2014-03-19 (×4): 1000 mL
  Filled 2014-03-16 (×4): qty 1000

## 2014-03-16 NOTE — Progress Notes (Addendum)
Called to pt bedside by RN for o2 sats in 70s.  RT instructed rn to remove pt from vent and bag until I arrived.  Pt was bagged on 15l o2 with peep valve, lavaged, then suctioned for moderate amount of thin tan/pink tinged secretions.  Pt tolerated well and o2 sats improved up to 100%.  Pt placed back on vent at previous settings.  HR 91, spo2 99%.  RT will continue to monitor pt.

## 2014-03-16 NOTE — Progress Notes (Signed)
Audible air leak noted upon arrival to room.  For third time tonight, air added to cuff which stopped leak.  MD notified.  CXR pending.  RN aware.

## 2014-03-16 NOTE — Progress Notes (Signed)
Extensive discussion with daughter Mariann Laster at bedside regarding patients current clinical status.  He has had worsening hypoxemia despite 100% FiO2 and 14 PEEP.  She understands that this is likely a terminal situation and wants to continue current active medical therapies but in the event of cardiac arrest, no CPR.  She is hopeful that the radiation therapy will make a difference in the size of the tumor and we will see clinical improvement.  A large concern for her is that she does not want to make the 'decision' to withdrawal care.  She requests that we are honest with her regarding his status - "no sugar coating" and wants to know if he is exhibiting signs of further decline.  We discussed the process of comfort measures during transition to death and she is comfortable with such measures.     Noe Gens, NP-C [facility] Pgr: [phone] or [phone]  Baltazar Apo, MD, PhD 03/17/2014, 11:17 AM [facility] [phone] or if no answer [phone]

## 2014-03-16 NOTE — Progress Notes (Signed)
NUTRITION FOLLOW UP  Intervention:   - Continue Vital AF 1.2 at 10 mL/hr and increase by 10 mL/hr every 8 hours to goal rate of 70 mL/hr.   - TF provides 2016 kcal (98% of estimated energy needs), 126 g protein, and 1362 mL of H2O.  - NP to add Reglan  - RD will continue to monitor.   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 Past medical history of pancreatitis, HTN, peptic ulcer, alcohol abuse, GERD. Admitted to the ED 2/6 with new onset of hemoptysis.  2/9-bronchoscopy- mucus plugging LUL, mass left upper lobe, biopsy, brushing, washing; biopsy with high grade, poorly differentiated carcinoma   2/11- Intubated  - TF held 2/13 due to high residuals. Per RN, pt's stomach became distended and pt with 800 mL residuals when TF running at 30 mL/hr.  - Consult received for TF initiation and management.  - Per MD note, trickle feeds of 10 mL/hr and assess for residuals. MD to add Reglan if residuals still high.  - Pt with poor prognosis. Palliative care consulted to discuss [place].    2/16: - Per RN, pt's with little residuals, but stomach still appears distended.  - Palliative care consulted for [place].  - Pt agitated during RD visit.  - Recommend continuing trickle feeds.  - Labs reviewed.   2/17: - Pt tolerating trickle feeds with no residuals per RN.  - Spoke with NP about TF advancement. Will advance TF slowly and monitor residuals. NP to add Reglan.   Patient is currently intubated on ventilator support MV: 14.6 L/min Temp (24hrs), Avg:99.3 F (37.4 C), Min:98.3 F (36.8 C), Max:101.9 F (38.8 C)  Propofol:  none  Height: Ht Readings from Last 1 Encounters:  03/15/14 _0  (1.676 m)    Weight Status:   Wt Readings from Last 1 Encounters:  03/16/14 148 lb 13 oz (67.5 kg)    Re-estimated needs:  Kcal: 2060 Protein: 105-125 g Fluid: Per MD  Skin: Intact  Diet Order: Diet NPO time specified   Intake/Output Summary (Last 24 hours) at 03/16/14 1025 Last data filed at 03/16/14 0800  Gross per 24 hour  Intake 2495.13 ml  Output   1160 ml  Net 1335.13 ml    Last BM: 2/17   Labs:   Recent Labs Lab 03/13/14 0500 03/14/14 0505 03/15/14 0511 03/16/14 0500  NA 137 134* 138 133*  K 3.9 3.7 3.5 3.6  CL 109 104 110 107  CO2 _1 BUN 24* 26* 27* 26*  CREATININE 1.75* 1.65* 1.65* 1.64*  CALCIUM 8.0* 8.1* 8.1* 8.1*  MG 1.7 1.8 1.9 1.8  PHOS 2.9 2.9  --   --   GLUCOSE 136* 100* 93 120*    CBG (last 3)   Recent Labs  03/15/14 2330 03/16/14 0347 03/16/14 0748  GLUCAP 99 111* 128*    Scheduled Meds: . antiseptic oral rinse  7 mL Mouth Rinse QID  . chlorhexidine  15 mL Mouth Rinse BID  . heparin subcutaneous  5,000 Units Subcutaneous 3 times per day  . latanoprost  1 drop  Both Eyes QHS  . metoCLOPramide (REGLAN) injection  5 mg Intravenous 3 times per day  . pantoprazole (PROTONIX) IV  40 mg Intravenous Q12H  . piperacillin-tazobactam (ZOSYN)  IV  3.375 g Intravenous 3 times per day  . QUEtiapine  25 mg Oral Daily  . valACYclovir  1,000 mg Per Tube Daily  . vancomycin  500 mg Intravenous Q12H  . voriconazole  200 mg Per Tube Q12H    Continuous Infusions: . sodium chloride 10 mL/hr at 03/14/14 1725  . sodium chloride 10 mL/hr at 03/14/14 1725  . diltiazem (CARDIZEM) infusion Stopped (03/13/14 1930)  . feeding supplement (VITAL AF 1.2 CAL) 1,000 mL (03/14/14 1727)  . fentaNYL infusion INTRAVENOUS 300 mcg/hr (03/16/14 0510)  . phenylephrine (NEO-SYNEPHRINE) Adult infusion 60 mcg/min (03/16/14 1018)    Laurette Schimke [place], [place], [place]

## 2014-03-16 NOTE — Progress Notes (Signed)
PULMONARY / CRITICAL CARE MEDICINE   Name: [name] [name] MRN: [id number] DOB: 1945/10/07    ADMISSION DATE:  03/18/2014 CONSULTATION DATE:  03/05/14  REFERRING MD :  Dr. Alcario Drought / [facility]   CHIEF COMPLAINT:  hemoptysis  INITIAL PRESENTATION: 69 y/o M with known enlarging mass in LUL (Rx for aspergillus disease) admitted to [facility] on 2/6 with hemoptysis, LUL cavitary lesion with mediastinal invasion.  STUDIES:  2/05  CXR >> LUL cavitary lseion and mediastinal invasion  SIGNIFICANT EVENTS:   2/06  Admitted with hemoptysis 2/08  PCCM consulted for evaluation 2/09  Bronchoscopy> mucus plugging LUL, mass left upper lobe, biopsy, brushing, washing; biopsy with high grade, poorly differentiated carcinoma 2/11  Intubated 2/13  High residuals, TF held 2/15  Radiation mapping completed 2/16  On neo 110 mcg, fent 241mg, did not tolerate PEEP 10/80%   2/17  Day 2 of Radiation therapy, on neo, fent, PEEP 14/100%   SUBJECTIVE: RN reports pt saturations running 85-92%, neo at 100 mcg, fent 300 mcg.  BMx1, no diarrhea.   VITAL SIGNS: Temp:  [98.3 F (36.8 C)-101.9 F (38.8 C)] 98.6 F (37 C) (02/17 0802) Pulse Rate:  [85-105] 85 (02/17 0318) Resp:  [14-30] 25 (02/17 0830) BP: (73-174)/(38-77) 112/56 mmHg (02/17 0830) SpO2:  [71 %-100 %] 86 % (02/17 0830) FiO2 (%):  [80 %-100 %] 100 % (02/17 0822) Weight:  [148 lb 13 oz (67.5 kg)] 148 lb 13 oz (67.5 kg) (02/17 0500)   HEMODYNAMICS: CVP:  [7 mmHg-17 mmHg] 8 mmHg   VENTILATOR SETTINGS: Vent Mode:  [-] PRVC FiO2 (%):  [80 %-100 %] 100 % Set Rate:  [14 bmp] 14 bmp Vt Set:  [510 mL] 510 mL PEEP:  [12 cmH20-14 cmH20] 14 cmH20 Plateau Pressure:  [20 cmH20-24 cmH20] 20 cmH20   INTAKE / OUTPUT:  Intake/Output Summary (Last 24 hours) at 03/16/14 0854 Last data filed at 03/16/14 0800  Gross per 24 hour  Intake 2657.73 ml  Output   1295 ml  Net 1362.73 ml   PHYSICAL EXAMINATION: General:  Cachectic male on vent HEENT: [place]/AT, ETT in  place PULM: diminished LUL, coarse on R CV: s1s2, irreg irreg, PAC's noted on monitor, underlying SR, JVD noted Ab: BS+, soft,  Ext: warm, no edema, RUE swelling Neuro: sedate, periods of intermittent agitation alternating with wakefulness   LABS:  CBC  Recent Labs Lab 03/14/14 0505 03/15/14 0511 03/16/14 0500  WBC 34.0* 32.1* 39.6*  HGB 8.3* 7.1* 8.0*  HCT 25.3* 21.5* 24.7*  PLT 535* 504* 642*   Coag's No results for input(s): APTT, INR in the last 168 hours.   BMET  Recent Labs Lab 03/13/14 0500 03/14/14 0505 03/15/14 0511  NA 137 134* 138  K 3.9 3.7 3.5  CL 109 104 110  CO2 21 21 21   BUN 24* 26* 27*  CREATININE 1.75* 1.65* 1.65*  GLUCOSE 136* 100* 93   Electrolytes  Recent Labs Lab 03/13/14 0500 03/14/14 0505 03/15/14 0511  CALCIUM 8.0* 8.1* 8.1*  MG 1.7 1.8 1.9  PHOS 2.9 2.9  --    Sepsis Markers  Recent Labs Lab 03/11/14 1047  LATICACIDVEN 1.4   ABG  Recent Labs Lab 03/13/14 0358 03/14/14 0232 03/15/14 1800  PHART 7.352 7.351 7.273*  PCO2ART 36.1 34.8* 43.3  PO2ART 69.9* 54.1* 67.3*   Liver Enzymes No results for input(s): AST, ALT, ALKPHOS, BILITOT, ALBUMIN in the last 168 hours.   Cardiac Enzymes No results for input(s): TROPONINI, PROBNP in the last 168  hours.   Glucose  Recent Labs Lab 03/15/14 1131 03/15/14 1549 03/15/14 1941 03/15/14 2330 03/16/14 0347 03/16/14 0748  GLUCAP 112* 126* 94 99 111* 128*   Imaging  2/17 CXR >> ETT 3.6 cm above carina, no change in L hemithorax mass, L pleural effusion  ASSESSMENT / PLAN:  RESPIRATORY:  A: Acute respiratory failure with hypoxemia - due to HCAP, COPD, LUL Mass, pulmonary edema. 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   P:   Heme/onc consulted >> proceeding with radiation, another treatment  2/16 Treat for HCAP, see ID Wean PEEP/FiO2 for saturations > 88%, suspect that intrathoracic pressures are impacting BP Consider diuresis once he hemodynamically stabilizes. PRN albuterol  Repeat ABG now  CV: A: Afib with RVR - improved on Cardizem gtt (stopped 2/14) Shock - likely sedation related.  ECHO with normal EF.  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  P:  Advance TF to goal rate, monitor for tolerance PPI Reglan x 4 doses with high residuals  NEURO:  A: Pain - ICU associated discomfort P:  Fentanyl gtt for pain, max to 300 mcg  PRN Versed for sedation  Goal RASS:  -2 Seroquel 25 mg QD  ENDOCRINE:  A: Mild Hyperglycemia - range 110 to 130 P: Monitor glucose on BMP, if consistently greater than 180 add SSI   GLOBAL: Palliative Care met with daughter Mariann Laster - continue full code / support.  Ongoing discussion with her regarding patients care.  Would recommend no CPR in the event of arrest (will discuss with family).   Noe Gens, NP-C [place] [facility] Pgr: [phone] or [phone]   Attending Note:  I have examined patient, reviewed labs, studies and notes. I have  discussed the case with B Ollis, and I agree with the data and plans as amended above. VDRF due to LUL mass with collapse. He is hypoxemic despite maximal vent support. Little change in overall status from XRT - I do not expect to see much improvement from this. The pt is tachypneic, uncomfortable. Chances for a successful extubation are very small. Suspect that we will need to move to to palliation soon. Will discuss with his daughter.  Independent critical care time is 40 minutes.   Baltazar Apo, MD, PhD 03/16/2014, 10:23 AM [place] [facility] [phone] or if no answer [phone]

## 2014-03-16 NOTE — Progress Notes (Signed)
Transported patient on LTV vent to Oncology Radiation on 100% PEEP of 14 Vt 510 rate 14.  Suction prior to transport resulted small amount of red sputum.  Patient tolerated transport well.  SpO2 during transport remained at 96% and higher.  Placed patient back on original settings on the Servo-i.  Hooked suction and subglottic suction back up, checked vent settings and alarms. Patient still has SpO2 of 96% or greater.

## 2014-03-16 NOTE — Progress Notes (Signed)
Called to pt bedside for O2 sats in 70s.  Pt removed from vent, lavaged, bagged and suctioned for moderate amount of thick tan, blood-tinged secretions.  Pt tolerated well and placed back on vent at previous settings.  HR 88, spo2 97%.

## 2014-03-17 ENCOUNTER — Encounter: Payer: Self-pay | Admitting: Radiation Oncology

## 2014-03-17 ENCOUNTER — Inpatient Hospital Stay (HOSPITAL_COMMUNITY): Payer: Medicare Other

## 2014-03-17 ENCOUNTER — Ambulatory Visit
Admit: 2014-03-17 | Discharge: 2014-03-17 | Disposition: A | Discharge status: Home / Self Care | Payer: Medicare Other | Attending: Radiation Oncology | Admitting: Radiation Oncology

## 2014-03-17 ENCOUNTER — Ambulatory Visit (HOSPITAL_COMMUNITY): Admission: RE | Admit: 2014-03-17 | Payer: Medicare Other | Source: Ambulatory Visit

## 2014-03-17 LAB — GLUCOSE, CAPILLARY
GLUCOSE-CAPILLARY: 121 mg/dL — AB (ref 70–99)
Glucose-Capillary: 133 mg/dL — ABNORMAL HIGH (ref 70–99)
Glucose-Capillary: 104 mg/dL — ABNORMAL HIGH (ref 70–99)

## 2014-03-17 LAB — BASIC METABOLIC PANEL
ANION GAP: 6 (ref 5–15)
BUN: 32 mg/dL — ABNORMAL HIGH (ref 6–23)
CALCIUM: 7.9 mg/dL — AB (ref 8.4–10.5)
CO2: 23 mmol/L (ref 19–32)
CREATININE: 1.94 mg/dL — AB (ref 0.50–1.35)
Chloride: 110 mmol/L (ref 96–112)
GFR calc Af Amer: 40 mL/min — ABNORMAL LOW (ref >90)
GFR, EST NON AFRICAN AMERICAN: 34 mL/min — AB (ref >90)
Glucose, Bld: 115 mg/dL — ABNORMAL HIGH (ref 70–99)
Potassium: 4.2 mmol/L (ref 3.5–5.1)
SODIUM: 139 mmol/L (ref 135–145)

## 2014-03-17 LAB — CBC
HCT: 22.4 % — ABNORMAL LOW (ref 39.0–52.0)
Hemoglobin: 7.1 g/dL — ABNORMAL LOW (ref 13.0–17.0)
MCH: 27.2 pg (ref 26.0–34.0)
MCHC: 31.7 g/dL (ref 30.0–36.0)
MCV: 85.8 fL (ref 78.0–100.0)
PLATELETS: 573 10*3/uL — AB (ref 150–400)
RBC: 2.61 MIL/uL — AB (ref 4.22–5.81)
RDW: 16.4 % — AB (ref 11.5–15.5)
WBC: 35.1 10*3/uL — ABNORMAL HIGH (ref 4.0–10.5)

## 2014-03-17 LAB — VANCOMYCIN, TROUGH: Vancomycin Tr: 25.6 ug/mL (ref 10.0–20.0)

## 2014-03-17 NOTE — Progress Notes (Signed)
NUTRITION FOLLOW UP  Intervention:   - Continue Vital AF 1.2 at 40 mL/hr and increase by 10 mL/hr every 8 hours to goal rate of 70 mL/hr.   - TF goal regimen provides 2016 kcal (98% of estimated energy needs), 126 g protein, and 1362 mL of H2O.  - RD will continue to monitor.   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 Past medical history of pancreatitis, HTN, peptic ulcer, alcohol abuse, GERD. Admitted to the ED 2/6 with new onset of hemoptysis.  2/9-bronchoscopy- mucus plugging LUL, mass left upper lobe, biopsy, brushing, washing; biopsy with high grade, poorly differentiated carcinoma   2/11- Intubated  - TF held 2/13 due to high residuals. Per RN, pt's stomach became distended and pt with 800 mL residuals when TF running at 30 mL/hr.  - Consult received for TF initiation and management.  - Per MD note, trickle feeds of 10 mL/hr and assess for residuals. MD to add Reglan if residuals still high.  - Pt with poor prognosis. Palliative care consulted to discuss [place].    2/16: - Per RN, pt's with little residuals, but stomach still appears distended.  - Palliative care consulted for [place].  - Pt agitated during RD visit.  - Recommend continuing trickle feeds.  - Labs reviewed.   2/17: - Pt tolerating trickle feeds with no residuals per RN.  - Spoke with NP about TF advancement. Will advance TF slowly and monitor residuals. NP to add Reglan.   2/18: - Pt tolerating TF @ 40 mL/hr with minimal residuals.  - Pt receiving Reglan.  - Recommend continuing advancement.  Patient is currently intubated on ventilator  support MV: 8.6 L/min Temp (24hrs), Avg:100.6 F (38.1 C), Min:99.6 F (37.6 C), Max:102.1 F (38.9 C)  Propofol: none  Height: Ht Readings from Last 1 Encounters:  03/15/14 5' 6"  (1.676 m)    Weight Status:   Wt Readings from Last 1 Encounters:  03/17/14 154 lb 8.7 oz (70.1 kg)    Re-estimated needs:  Kcal: 1917 Protein: 105-125 g Fluid: Per MD  Skin: Intact  Diet Order: Diet NPO time specified   Intake/Output Summary (Last 24 hours) at 03/17/14 1136 Last data filed at 03/17/14 0600  Gross per 24 hour  Intake 3134.61 ml  Output    510 ml  Net 2624.61 ml    Last BM: 2/17   Labs:   Recent Labs Lab 03/13/14 0500 03/14/14 0505 03/15/14 0511 03/16/14 0500 03/17/14 0500  NA 137 134* 138 133* 139  K 3.9 3.7 3.5 3.6 4.2  CL 109 104 110 107 110  CO2 21 21 21 22 23   BUN 24* 26* 27* 26* 32*  CREATININE 1.75* 1.65* 1.65* 1.64* 1.94*  CALCIUM 8.0* 8.1* 8.1* 8.1* 7.9*  MG 1.7 1.8 1.9 1.8  --   PHOS 2.9 2.9  --   --   --   GLUCOSE 136* 100* 93 120* 115*    CBG (last 3)   Recent Labs  03/16/14 0748 03/16/14 1558 03/17/14 0753  GLUCAP 128* 110* 133*    Scheduled Meds: . antiseptic oral  rinse  7 mL Mouth Rinse QID  . chlorhexidine  15 mL Mouth Rinse BID  . feeding supplement (VITAL AF 1.2 CAL)  1,000 mL Per Tube Q24H  . heparin subcutaneous  5,000 Units Subcutaneous 3 times per day  . latanoprost  1 drop Both Eyes QHS  . metoCLOPramide (REGLAN) injection  5 mg Intravenous 3 times per day  . pantoprazole (PROTONIX) IV  40 mg Intravenous Q12H  . piperacillin-tazobactam (ZOSYN)  IV  3.375 g Intravenous 3 times per day  . QUEtiapine  25 mg Oral Daily  . valACYclovir  1,000 mg Per Tube Daily  . vancomycin  500 mg Intravenous Q12H  . voriconazole  200 mg Per Tube Q12H    Continuous Infusions: . sodium chloride 10 mL/hr at 03/17/14 0600  . sodium chloride 10 mL/hr at 03/17/14 0600  . diltiazem (CARDIZEM) infusion Stopped (03/13/14 1930)  . fentaNYL  infusion INTRAVENOUS 350 mcg/hr (03/17/14 0651)  . phenylephrine (NEO-SYNEPHRINE) Adult infusion 80 mcg/min (03/17/14 0651)    Laurette Schimke [place], [place], [place]

## 2014-03-17 NOTE — Progress Notes (Signed)
[facility] Progress Note Patient Name: Genevieve Ritzel DOB: 1945/10/25 MRN: [id number]   Date of Service  03/17/2014  HPI/Events of Note  Mr. Schlabach has struggled mightily this evening with hypoxemia in that the nurses and RT's have had to bag ventilate him as many as 7 times in the last hour. Does not have marked secretions despite frequent suctioning.  I called his daughter Lindell Noe to explain that his hypoxemia is refractory and I am concerned that he may not make it through the night and I advised that family should come in to be with him.  I also advised that we should move towards full comfort measures.  eICU Interventions  Will change to APRV as he seems to respond to longer periods of high pressures and hopefully this will reduce the frequency of having to bag ventilate him.  Focus on comfort> escalate fentanyl as needed.     Intervention Category Major Interventions: Respiratory failure - evaluation and management;End of life / care limitation discussion  Sheri Prows 03/17/2014, 2:36 AM

## 2014-03-17 NOTE — Progress Notes (Signed)
PULMONARY / CRITICAL CARE MEDICINE   Name: [name] [name] MRN: [id number] DOB: 07-15-1945    ADMISSION DATE:  03/02/2014 CONSULTATION DATE:  03/05/14  REFERRING MD :  Dr. Alcario Drought / [facility]   CHIEF COMPLAINT:  hemoptysis  INITIAL PRESENTATION: 69 y/o M with known enlarging mass in LUL (Rx for aspergillus disease) admitted to [facility] on 2/6 with hemoptysis, LUL cavitary lesion with mediastinal invasion.  STUDIES:  2/05  CXR >> LUL cavitary lseion and mediastinal invasion  SIGNIFICANT EVENTS:   2/06  Admitted with hemoptysis 2/08  PCCM consulted for evaluation 2/09  Bronchoscopy> mucus plugging LUL, mass left upper lobe, biopsy, brushing, washing; biopsy with high grade, poorly differentiated carcinoma 2/11  Intubated 2/13  High residuals, TF held 2/15  Radiation mapping completed 2/16  On neo 110 mcg, fent 263mg, did not tolerate PEEP 10/80%   2/17  Day 2 of Radiation therapy, on neo, fent, PEEP 14/100% 2/18  Desaturations overnight, required bagging   SUBJECTIVE: RN reports pt desaturated overnight, required bagging several times.  Vent mode changed to APRV.  Daughter called in due to distress.   VITAL SIGNS: Temp:  [99.6 F (37.6 C)-100.9 F (38.3 C)] 100.9 F (38.3 C) (02/17 2345) Pulse Rate:  [84-97] 84 (02/18 0435) Resp:  [15-30] 17 (02/18 0700) BP: (81-143)/(39-72) 103/46 mmHg (02/18 0700) SpO2:  [63 %-100 %] 95 % (02/18 0700) FiO2 (%):  [100 %] 100 % (02/18 0435) Weight:  [154 lb 8.7 oz (70.1 kg)] 154 lb 8.7 oz (70.1 kg) (02/18 0500)   HEMODYNAMICS: CVP:  [9 mmHg-15 mmHg] 15 mmHg   VENTILATOR SETTINGS: Vent Mode:  [-] Bi-Vent FiO2 (%):  [100 %] 100 % Set Rate:  [14 bmp] 14 bmp Vt Set:  [510 mL] 510 mL PEEP:  [14 cmH20] 14 cmH20 Pressure Support:  [5 cmH20] 5 cmH20 Plateau Pressure:  [22 cmH20-26 cmH20] 25 cmH20   INTAKE / OUTPUT:  Intake/Output Summary (Last 24 hours) at 03/17/14 0833 Last data filed at 03/17/14 0600  Gross per 24 hour  Intake 3294.61 ml  Output     510 ml  Net 2784.61 ml   PHYSICAL EXAMINATION: General:  Cachectic male on vent HEENT: [place]/AT, ETT in place PULM: diminished LUL, coarse on R CV: s1s2, irreg irreg, PAC's noted on monitor, underlying SR, JVD noted Ab: BS+, soft,  Ext: warm, no edema, RUE swelling Neuro: sedate, periods of intermittent agitation alternating with wakefulness   LABS:  CBC  Recent Labs Lab 03/15/14 0511 03/16/14 0500 03/17/14 0500  WBC 32.1* 39.6* 35.1*  HGB 7.1* 8.0* 7.1*  HCT 21.5* 24.7* 22.4*  PLT 504* 642* 573*   Coag's No results for input(s): APTT, INR in the last 168 hours.   BMET  Recent Labs Lab 03/15/14 0511 03/16/14 0500 03/17/14 0500  NA 138 133* 139  K 3.5 3.6 4.2  CL 110 107 110  CO2 21 22 23   BUN 27* 26* 32*  CREATININE 1.65* 1.64* 1.94*  GLUCOSE 93 120* 115*   Electrolytes  Recent Labs Lab 03/13/14 0500 03/14/14 0505 03/15/14 0511 03/16/14 0500 03/17/14 0500  CALCIUM 8.0* 8.1* 8.1* 8.1* 7.9*  MG 1.7 1.8 1.9 1.8  --   PHOS 2.9 2.9  --   --   --    Sepsis Markers  Recent Labs Lab 03/11/14 1047  LATICACIDVEN 1.4   ABG  Recent Labs Lab 03/14/14 0232 03/15/14 1800 03/16/14 0920  PHART 7.351 7.273* 7.264*  PCO2ART 34.8* 43.3 47.7*  PO2ART 54.1* 67.3* 57.3*  Liver Enzymes No results for input(s): AST, ALT, ALKPHOS, BILITOT, ALBUMIN in the last 168 hours.   Cardiac Enzymes No results for input(s): TROPONINI, PROBNP in the last 168 hours.   Glucose  Recent Labs Lab 03/15/14 1941 03/15/14 2330 03/16/14 0347 03/16/14 0748 03/16/14 1558 03/17/14 0753  GLUCAP 94 99 111* 128* 110* 133*   Imaging  2/18 CXR >>  ASSESSMENT / PLAN:  RESPIRATORY:  A: Acute respiratory failure with hypoxemia - due to HCAP, COPD, LUL Mass, pulmonary edema. 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   P:   Heme/onc consulted >> proceeding with radiation, 10 treatments planned, D3/10 Treat for HCAP, see ID Wean PEEP/FiO2 for saturations > 80%, suspect that intrathoracic pressures are impacting BP Consider diuresis once he hemodynamically stabilizes. PRN albuterol  Continue APRV  CV: A: Afib with RVR - improved on Cardizem gtt (stopped 2/14) Shock - likely sedation related.  ECHO with normal EF.  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  P:  Advance TF to goal rate, monitor for tolerance PPI Reglan x 4 doses with high residuals  NEURO:  A: Pain - ICU associated discomfort P:  Fentanyl gtt for pain, max to 400 mcg  PRN Versed for sedation  Goal RASS:  -2 Seroquel 25 mg QD  ENDOCRINE:  A: Mild Hyperglycemia - range 110 to 130 P: Monitor glucose on BMP, if consistently greater than 180 add SSI   GLOBAL: Palliative Care met with daughter Mariann Laster - continue full medical and vent support, no escalation of care, DNR if arrests.    Noe Gens, NP-C [place] [facility] Pgr: [phone] or [phone]   Attending Note:  I have examined patient, reviewed labs, studies and notes. I have discussed the case  with B Ollis, and I agree with the data and plans as amended above. Unfortunate case with LUL malignancy, ARDS and VDRF. He is undergoing palliative / salvage XRT, but I remain very concerned that he cannot survive despite the aggressive care. His daughter understands that he likely will not survive but she is uncomfortable / unable to move to formal withdrawal of care. I believe she would actually be relieved if he were to die despite aggressive care because she knows he is uncomfortable. At this point we have agreed that we will not escalate his care in any way. He will continue to have XRT to see if this changes his LUL or WOB. Will continue to discuss with his family. Independent critical care time is 45 minutes.  Baltazar Apo, MD, PhD 03/17/2014, 11:19 AM [place] [facility] [phone] or if no answer [phone]

## 2014-03-17 NOTE — Progress Notes (Signed)
Spoke with Jethro Poling, RN assigned to this patient. She reports the patient is stable for now and there are plans to bring him down for treatment today at 1145 as planned. Therapist on L2 informed. Dr. Pablo Ledger informed.

## 2014-03-17 NOTE — Progress Notes (Signed)
MD notified of Patients desaturation in to the 70's despite being on 100% and peep of 10.  Per CCM MD no escalation of care at this point and no bagging.  RT to monitor and assess as needed.

## 2014-03-17 NOTE — Care Management Note (Signed)
CARE MANAGEMENT NOTE 03/17/2014  Patient:  [name] [name],[name] [name]   Account Number:  [id number]  Date Initiated:  03/08/2014  Documentation initiated by:  Dessa Phi  Subjective/Objective Assessment:   69 y/o m admitted w/Lung mass.PY:KDXI Ca.     Action/Plan:   From home.   Anticipated DC Date:  03/20/2014   Anticipated DC Plan:  [place]  In-house referral  Hospice / [place]  CM consult      Choice offered to / List presented to:             Status of service:  In process, will continue to follow Medicare Important Message given?  YES (If response is "NO", the following Medicare IM given date fields will be blank) Date Medicare IM given:  03/15/2014 Medicare IM given by:  [facility] Date Additional Medicare IM given:  03/09/2014 Additional Medicare IM given by:  [facility] Michall Noffke  Discharge Disposition:    Per UR Regulation:  Reviewed for med. necessity/level of care/duration of stay  If discussed at [place] of Stay Meetings, dates discussed:    Comments:  Feb. 18 2016/Kaiyah Eber L. Rosana Hoes, RN, BSN, CCM/Case Management [place] [phone] No discharge needs present of time of review. Palliative Care met with daughter Mariann Laster - continue full code / support, no escalation of care, DNR if arrests.   Feb. 15 2016/Aoife Bold L. Rosana Hoes, RN, BSN, CCM/Case Management [place] [phone] No discharge needs present of time of review.   Feb. 12 2016/Moksha Dorgan L. Rosana Hoes, RN, BSN, CCM/Case Management [place] [phone] No discharge needs present of time of review. patient requuired intubation pm of [id number].  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  S/P bronchoscopy on 2/8, then developed HCAP vs aspiration 2/9 early AM.  Treat as HCAP, f/u bronch and  cultures.  03/11/2014 Dessa Phi RN BSN NCM [phone] Bronchoscopy today.Noted may d/c today post procedure if stableNo anticipated d/c needs.

## 2014-03-17 NOTE — Progress Notes (Signed)
ANTIBIOTIC CONSULT NOTE - FOLLOW UP  Pharmacy Consult for Vancomycin, Zosyn, Antibiotic renal dose adjustment Indication: HCAP  Patient Measurements: Height: 5\' 6"  (167.6 cm) Weight: 154 lb 8.7 oz (70.1 kg) IBW/kg (Calculated) : 63.8  Vital Signs: Temp: 100.9 F (38.3 C) (02/17 2345) Temp Source: Oral (02/17 2345) BP: 103/46 mmHg (02/18 0700) Pulse Rate: 84 (02/18 0435) Intake/Output from previous day: 02/17 0701 - 02/18 0700 In: 3374.6 [I.V.:2051.3; NG/GT:1023.3; IV Piggyback:300] Out: 510 [Urine:510]  Labs:  Recent Labs  03/15/14 0511 03/16/14 0500 03/17/14 0500  WBC 32.1* 39.6* 35.1*  HGB 7.1* 8.0* 7.1*  PLT 504* 642* 573*  CREATININE 1.65* 1.64* 1.94*   Estimated Creatinine Clearance: 33.8 mL/min (by C-G formula based on Cr of 1.94).  Recent Labs  03/17/14 1000  VANCOTROUGH 25.6*    Assessment: 43 yoM admitted 2/5 with hemoptysis and progressing LUL cavitary lesion. 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 He also recently completed a 7 day course of outpt levaquin (1/21). Concerning for either worsening invasive aspergillus vs malignancy. PCCM consulted, repeat bronchoscopy done 2/8.  Pharmacy is consulted to dose vancomycin and Zosyn on 2/9.    (PTA) 2/6 >> voriconazole >> (PTA) 2/6 >> valacyclovir >>  2/9 >> Vanc >> 2/9 >> Zosyn >>  Today, 03/17/2014:   Day # 10 Vancomycin and Zosyn  Tmax: 100.9  WBCs: remains elevated, 35.1  Renal: CKD III, SCr increased to 1.94, CrCl ~ 34 ml/min  Last vancomycin trough level 16.8 (03/11/14) was therapeutic.  Vancomycin trough 25.6 (03/17/14) is supratherapeutic  Vancomycin dose in progress was stopped about half way through infusion.   Goal of Therapy:  Vancomycin trough level 15-20 mcg/ml Appropriate abx dosing, eradication of infection.   Plan:   Continue Zosyn 3.375g IV Q8H infused over 4hrs x10 days  Plan to complete 10 days of abx.  Discontinue  further doses after 2/18.  Discontinue Vancomycin 500mg  IV q12h.  High trough level today with partially infused dose this AM will complete planned 10 day of abx.  No further doses needed.   Gretta Arab PharmD, BCPS Pager [phone] 03/17/2014 8:08 AM

## 2014-03-17 NOTE — Progress Notes (Signed)
Patient desaturating to 73% Reviewed chart - no escalatio of care  Plan No interventions  Dr. Brand Males, M.D., [facility].C.P Pulmonary and Critical Care Medicine Staff Physician [place] [facility] Pager: [phone], If no answer or between  15:00h - 7:00h: call [phone]  03/17/2014 8:37 PM

## 2014-03-18 ENCOUNTER — Ambulatory Visit: Payer: Medicare Other

## 2014-03-18 ENCOUNTER — Ambulatory Visit
Admit: 2014-03-18 | Discharge: 2014-03-18 | Disposition: A | Discharge status: Home / Self Care | Payer: Medicare Other | Attending: Radiation Oncology | Admitting: Radiation Oncology

## 2014-03-18 ENCOUNTER — Ambulatory Visit: Payer: Medicare Other | Admitting: Radiation Oncology

## 2014-03-18 ENCOUNTER — Inpatient Hospital Stay (HOSPITAL_COMMUNITY): Payer: Medicare Other

## 2014-03-18 ENCOUNTER — Inpatient Hospital Stay: Admit: 2014-03-18 | Payer: Medicare Other | Admitting: Radiation Oncology

## 2014-03-18 LAB — GLUCOSE, CAPILLARY
GLUCOSE-CAPILLARY: 131 mg/dL — AB (ref 70–99)
GLUCOSE-CAPILLARY: 119 mg/dL — AB (ref 70–99)
Glucose-Capillary: 145 mg/dL — ABNORMAL HIGH (ref 70–99)
Glucose-Capillary: 141 mg/dL — ABNORMAL HIGH (ref 70–99)
Glucose-Capillary: 134 mg/dL — ABNORMAL HIGH (ref 70–99)

## 2014-03-18 LAB — CBC
HEMATOCRIT: 21.7 % — AB (ref 39.0–52.0)
HEMOGLOBIN: 6.9 g/dL — AB (ref 13.0–17.0)
MCH: 27.6 pg (ref 26.0–34.0)
MCHC: 31.8 g/dL (ref 30.0–36.0)
MCV: 86.8 fL (ref 78.0–100.0)
Platelets: 623 10*3/uL — ABNORMAL HIGH (ref 150–400)
RBC: 2.5 MIL/uL — ABNORMAL LOW (ref 4.22–5.81)
RDW: 16.6 % — ABNORMAL HIGH (ref 11.5–15.5)
WBC: 30.4 10*3/uL — ABNORMAL HIGH (ref 4.0–10.5)

## 2014-03-18 LAB — BASIC METABOLIC PANEL
Anion gap: 7 (ref 5–15)
BUN: 45 mg/dL — AB (ref 6–23)
CO2: 21 mmol/L (ref 19–32)
CREATININE: 2.82 mg/dL — AB (ref 0.50–1.35)
Calcium: 7.9 mg/dL — ABNORMAL LOW (ref 8.4–10.5)
Chloride: 110 mmol/L (ref 96–112)
GFR calc non Af Amer: 22 mL/min — ABNORMAL LOW (ref >90)
GFR, EST AFRICAN AMERICAN: 25 mL/min — AB (ref >90)
Glucose, Bld: 137 mg/dL — ABNORMAL HIGH (ref 70–99)
Potassium: 4.4 mmol/L (ref 3.5–5.1)
Sodium: 138 mmol/L (ref 135–145)

## 2014-03-18 MED ORDER — METOCLOPRAMIDE HCL 5 MG/ML IJ SOLN
5.0000 mg | Freq: Four times a day (QID) | INTRAMUSCULAR | Status: DC
  Administered 2014-03-18 – 2014-03-19 (×5): 5 mg via INTRAVENOUS
  Filled 2014-03-18 (×6): qty 2

## 2014-03-18 MED ORDER — ACETAMINOPHEN 160 MG/5ML PO SOLN: 650.0000 mg | ORAL | Status: DC | PRN

## 2014-03-18 MED ORDER — VITAMINS A & D EX OINT
TOPICAL_OINTMENT | CUTANEOUS | Status: AC
  Filled 2014-03-18: qty 5

## 2014-03-18 MED ORDER — VALACYCLOVIR HCL 500 MG PO TABS
500.0000 mg | ORAL_TABLET | Freq: Every day | ORAL | Status: DC
  Administered 2014-03-19: 500 mg
  Filled 2014-03-18: qty 1

## 2014-03-18 NOTE — Progress Notes (Signed)
Patient too unstable to take to radiation per Dr. Lamonte Sakai.

## 2014-03-18 NOTE — Progress Notes (Signed)
NUTRITION FOLLOW UP  Intervention:   - Pt with suspected ileus, if medically able, recommend resuming TF regimen of trickle feeds vs Vital AF 1.2 at 20 mL/hr and increase by 10 mL/hr every 8 hours to goal rate of 70 mL/hr.   - TF goal regimen provides 2016 kcal (101% of estimated energy needs), 126 g protein, and 1362 mL of H2O.  - RD will continue to monitor.   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 Past medical history of pancreatitis, HTN, peptic ulcer, alcohol abuse, GERD. Admitted to the ED 2/6 with new onset of hemoptysis.  2/9-bronchoscopy- mucus plugging LUL, mass left upper lobe, biopsy, brushing, washing; biopsy with high grade, poorly differentiated carcinoma   2/11- Intubated  - TF held 2/13 due to high residuals. Per RN, pt's stomach became distended and pt with 800 mL residuals when TF running at 30 mL/hr.  - Consult received for TF initiation and management.  - Per MD note, trickle feeds of 10 mL/hr and assess for residuals. MD to add Reglan if residuals still high.  - Pt with poor prognosis. Palliative care consulted to discuss [place].    2/16: - Per RN, pt's with little residuals, but stomach still appears distended.  - Palliative care consulted for [place].  - Pt agitated during RD visit.  - Recommend continuing trickle feeds.  - Labs reviewed.   2/17: - Pt tolerating trickle feeds with no residuals per RN.  - Spoke with NP about TF advancement. Will advance TF slowly and monitor residuals. NP to add Reglan.   2/18: - Pt tolerating TF @ 40 mL/hr with minimal residuals.  - Pt receiving Reglan.  -  Recommend continuing advancement.  2/19: - Pt too unstable for radiation therapy. - Pt not tolerating TF overnight. Abdomen is tight.  - Per MD note, suspected ileus- TF held. - RD will continue to monitor.  - Recommend resuming TF when medically able.  - Per MD note: pt does not wish to be on MV for >1 week. If no progress through the weekend, then would honor his wishes and push for palliative extubation. - Labs reviewed - RD will continue to monitor  Patient is currently intubated on ventilator support MV: 9.1 L/min Temp (24hrs), Avg:100.9 F (38.3 C), Min:98.9 F (37.2 C), Max:102.8 F (39.3 C)  Propofol: none  Height: Ht Readings from Last 1 Encounters:  03/15/14 _0  (1.676 m)    Weight Status:   Wt Readings from Last 1 Encounters:  03/18/14 151 lb 14.4 oz (68.9 kg)    Re-estimated needs:  Kcal: 1986 Protein: 105-125 g Fluid: Per MD  Skin: Intact  Diet Order: Diet NPO time specified   Intake/Output Summary (Last 24 hours) at 03/18/14 1059 Last data filed at 03/18/14 1000  Gross per 24 hour  Intake 3792.69 ml  Output   1275 ml  Net 2517.69 ml    Last BM: 2/17   Labs:   Recent Labs Lab 03/13/14 0500 03/14/14 0505 03/15/14 0511 03/16/14 0500 03/17/14 0500 03/18/14 0430  NA 137 134* 138 133* 139 138  K 3.9  3.7 3.5 3.6 4.2 4.4  CL 109 104 110 107 110 110  CO2 _0 BUN 24* 26* 27* 26* 32* 45*  CREATININE 1.75* 1.65* 1.65* 1.64* 1.94* 2.82*  CALCIUM 8.0* 8.1* 8.1* 8.1* 7.9* 7.9*  MG 1.7 1.8 1.9 1.8  --   --   PHOS 2.9 2.9  --   --   --   --   GLUCOSE 136* 100* 93 120* 115* 137*    CBG (last 3)   Recent Labs  03/17/14 1231 03/17/14 1619 03/18/14 0756  GLUCAP 121* 104* 131*    Scheduled Meds: . antiseptic oral rinse  7 mL Mouth Rinse QID  . chlorhexidine  15 mL Mouth Rinse BID  . feeding supplement (VITAL AF 1.2 CAL)  1,000 mL Per Tube Q24H  . heparin subcutaneous  5,000 Units Subcutaneous 3 times per day  .  latanoprost  1 drop Both Eyes QHS  . metoCLOPramide (REGLAN) injection  5 mg Intravenous 4 times per day  . pantoprazole (PROTONIX) IV  40 mg Intravenous Q12H  . QUEtiapine  25 mg Oral Daily  . valACYclovir  1,000 mg Per Tube Daily  . voriconazole  200 mg Per Tube Q12H    Continuous Infusions: . sodium chloride 10 mL/hr at 03/18/14 0300  . sodium chloride 10 mL/hr at 03/18/14 0300  . diltiazem (CARDIZEM) infusion Stopped (03/13/14 1930)  . fentaNYL infusion INTRAVENOUS 375 mcg/hr (03/18/14 0912)  . phenylephrine (NEO-SYNEPHRINE) Adult infusion 140 mcg/min (03/18/14 1031)    Laurette Schimke [place], [place], [place]

## 2014-03-18 NOTE — Progress Notes (Signed)
Brief Pharmacy Note:  QTc documented per cardiac monitoring strip this AM as 0.54. Patient currently on Voriconazole, Quetiapine, and Ondansetron, all of which can prolong QT interval. Per discussion with Dr. Lamonte Sakai with PCCM, d/c all three agents for now. MD to reassess in AM and make adjustments to therapy as indicated.  Lindell Spar, PharmD, BCPS Pager: [phone] 03/18/2014 1:51 PM

## 2014-03-18 NOTE — Progress Notes (Signed)
Received call from [place], RN that Dr. Lamonte Sakai rounded this morning and wishes to hold radiation treatment for today. Dr. Tammi Klippel and Thad on L2 informed of these findings.

## 2014-03-18 NOTE — Progress Notes (Signed)
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  Patient's SCr elevated today at 2.82 with CrCl ~ 23 mL/min.  Will reduce Valtrex dose to 500 mg daily with CrCl < 30 mL/min.   Lindell Spar, PharmD, BCPS Pager: [phone] 03/18/2014 11:37 AM

## 2014-03-18 NOTE — Progress Notes (Signed)
PULMONARY / CRITICAL CARE MEDICINE   Name: [name] [name] MRN: [id number] DOB: 1945-03-15    ADMISSION DATE:  03/05/2014 CONSULTATION DATE:  03/05/14  REFERRING MD :  Dr. Alcario Drought / [facility]   CHIEF COMPLAINT:  hemoptysis  INITIAL PRESENTATION: 69 y/o M with known enlarging mass in LUL (Rx for aspergillus disease) admitted to [facility] on 2/6 with hemoptysis, LUL cavitary lesion with mediastinal invasion.  STUDIES:  2/05  CXR >> LUL cavitary lseion and mediastinal invasion  SIGNIFICANT EVENTS:   2/06  Admitted with hemoptysis 2/08  PCCM consulted for evaluation 2/09  Bronchoscopy> mucus plugging LUL, mass left upper lobe, biopsy, brushing, washing; biopsy with high grade, poorly differentiated carcinoma 2/11  Intubated 2/13  High residuals, TF held 2/15  Radiation mapping completed 2/16  On neo 110 mcg, fent 253mg, did not tolerate PEEP 10/80%   2/17  Day 2 of Radiation therapy, on neo, fent, PEEP 14/100% 2/18  Desaturations overnight, required bagging   SUBJECTIVE:  More desaturations overnight 2/18-19  More ectopy High residuals, firm abdomen  VITAL SIGNS: Temp:  [98.9 F (37.2 C)-102.8 F (39.3 C)] 100.8 F (38.2 C) (02/19 0800) Pulse Rate:  [82-99] 97 (02/19 0431) Resp:  [14-35] 22 (02/19 0930) BP: (69-155)/(35-69) 98/46 mmHg (02/19 0930) SpO2:  [71 %-100 %] 99 % (02/19 0930) FiO2 (%):  [100 %] 100 % (02/19 0800) Weight:  [68.9 kg (151 lb 14.4 oz)] 68.9 kg (151 lb 14.4 oz) (02/19 0400)   HEMODYNAMICS:     VENTILATOR SETTINGS: Vent Mode:  [-] Bi-Vent FiO2 (%):  [100 %] 100 % Pressure Support:  [5 cmH20] 5 cmH20 Plateau Pressure:  [28 cmH20] 28 cmH20   INTAKE / OUTPUT:  Intake/Output Summary (Last 24 hours) at 03/18/14 0946 Last data filed at 03/18/14 0900  Gross per 24 hour  Intake 3558.87 ml  Output    395 ml  Net 3163.87 ml   PHYSICAL EXAMINATION: General:  Cachectic male on vent HEENT: [place]/AT, ETT in place PULM: diminished LUL, coarse on R CV: s1s2, irreg  irreg, PAC's noted on monitor, underlying SR, JVD noted Ab: BS+, abd more firm 2/19 Ext: warm, no edema, RUE swelling Neuro: sedate, periods of intermittent agitation alternating with wakefulness   LABS:  CBC  Recent Labs Lab 03/16/14 0500 03/17/14 0500 03/18/14 0430  WBC 39.6* 35.1* 30.4*  HGB 8.0* 7.1* 6.9*  HCT 24.7* 22.4* 21.7*  PLT 642* 573* 623*   Coag's No results for input(s): APTT, INR in the last 168 hours.   BMET  Recent Labs Lab 03/16/14 0500 03/17/14 0500 03/18/14 0430  NA 133* 139 138  K 3.6 4.2 4.4  CL 107 110 110  CO2 _0 BUN 26* 32* 45*  CREATININE 1.64* 1.94* 2.82*  GLUCOSE 120* 115* 137*   Electrolytes  Recent Labs Lab 03/13/14 0500 03/14/14 0505 03/15/14 0511 03/16/14 0500 03/17/14 0500 03/18/14 0430  CALCIUM 8.0* 8.1* 8.1* 8.1* 7.9* 7.9*  MG 1.7 1.8 1.9 1.8  --   --   PHOS 2.9 2.9  --   --   --   --    Sepsis Markers  Recent Labs Lab 03/11/14 1047  LATICACIDVEN 1.4   ABG  Recent Labs Lab 03/14/14 0232 03/15/14 1800 03/16/14 0920  PHART 7.351 7.273* 7.264*  PCO2ART 34.8* 43.3 47.7*  PO2ART 54.1* 67.3* 57.3*   Liver Enzymes No results for input(s): AST, ALT, ALKPHOS, BILITOT, ALBUMIN in the last 168 hours.   Cardiac Enzymes No results for input(s):  TROPONINI, PROBNP in the last 168 hours.   Glucose  Recent Labs Lab 03/16/14 0748 03/16/14 1558 03/17/14 0753 03/17/14 1231 03/17/14 1619 03/18/14 0756  GLUCAP 128* 110* 133* 121* 104* 131*   Imaging  2/18 CXR >>  ASSESSMENT / PLAN:  RESPIRATORY:  A: Acute respiratory failure with hypoxemia - due to HCAP, COPD, LUL Mass, pulmonary edema. Large left upper lobe mass - s/p bronchoscopy 2/8 with high grade poorly differentiated carcinoma. 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   P:   Heme/onc consulted >> proceeding with radiation, 10  treatments planned, D3/10; holding on 2/19 due to instability Treat for HCAP, see ID Wean PEEP/FiO2 for saturations > 80%, suspect that intrathoracic pressures are impacting BP; have been unable to wean  Consider diuresis once he hemodynamically stabilizes. PRN albuterol  Continue APRV for now Pt indicated at the time of intubation that he would accept MV for a week. If no progress through this weekend then would honor his wishes and push for palliative extubation.   CV: A: Afib with RVR - improved on Cardizem gtt (stopped 2/14) Shock - likely sedation related.  ECHO with normal EF.  Cortisol wnl 2/16 Ectopy with PAC's P:  Neosynephrine with AFib, wean for MAP >65 CVP Q4, goal 8-12 ICU monitoring PRN lopressor for HR > 110 NS @ KVO  RENAL: A: CKD Stage 3 - worsening sr cr  Hypomagnesemia  P:   Monitor BMET and UOP Replace electrolytes as needed NOT an HD candidate  HEMATOLOGY:  A: Chronic anemia - no acute bleeding P:  Monitor CBC Heparin SQ for DVT  ID: A: Possible invasive aspergillus with possible superimposed abscess HCAP  Hx mycobacterial disease (M gordonae) P:   BCx2 2/6 >> negative BCx2 2/9 >> neg Resp 2/6 >> OPF BAL 2/8 >> OPF BAL fungal 2/8 >> yeast  Abx:  Voriconazole (home med, cont indefinitely) >> Vanc 2/9, D10/10 on 2/18 Zosyn 2/9, D10/10 on 2/18  GI:  A: Severe Protein Calorie Malnutrition  High Gastric Residuals - noted 2/13, TF restarted 2/15 but not tolerating Suspect ileus P:  Hold TF for now KUB ordered 2/19 PPI Reglan reordered 2/19  NEURO:  A: Pain - ICU associated discomfort P:  Fentanyl gtt for pain, max to 400 mcg  PRN Versed for sedation  Goal RASS:  -2 Seroquel 25 mg QD  ENDOCRINE:  A: Mild Hyperglycemia - range 110 to 130 P: Monitor glucose on BMP, if consistently greater than 180 add SSI   GLOBAL: Palliative Care met with daughter Mariann Laster - continue full medical and vent support, no escalation of care, DNR if  arrests. Mariann Laster understands that he is not going to survive this illness but is having difficul;ty with the responsibility of representing several family voices and with the burden of withdrawal of care. I believe she would actually be relieved if he were to decline and die on his own because she realizes he is suffering. Note that the pt himself indicated at the time of intubation that he would only want this for 1 week. We will NOT escalate care in any way. Will need to broach subject of withdrawal if he doesn't decline on his own over the weekend. I do not believe he is stable for XRT today, may not be able to get any more. He is not an HD candidate.    Independent critical care time  is 35 minutes.   Baltazar Apo, MD, PhD 03/18/2014, 9:46 AM [place] [facility] [phone] or if no answer [phone]

## 2014-03-19 DIAGNOSIS — Z7189 Other specified counseling: Secondary | ICD-10-CM

## 2014-03-19 LAB — BASIC METABOLIC PANEL
ANION GAP: 7 (ref 5–15)
BUN: 51 mg/dL — AB (ref 6–23)
CO2: 21 mmol/L (ref 19–32)
Calcium: 8 mg/dL — ABNORMAL LOW (ref 8.4–10.5)
Chloride: 106 mmol/L (ref 96–112)
Creatinine, Ser: 3.38 mg/dL — ABNORMAL HIGH (ref 0.50–1.35)
GFR calc Af Amer: 20 mL/min — ABNORMAL LOW (ref >90)
GFR calc non Af Amer: 18 mL/min — ABNORMAL LOW (ref >90)
GLUCOSE: 142 mg/dL — AB (ref 70–99)
Potassium: 4.4 mmol/L (ref 3.5–5.1)
Sodium: 134 mmol/L — ABNORMAL LOW (ref 135–145)

## 2014-03-19 LAB — GLUCOSE, CAPILLARY
Glucose-Capillary: 140 mg/dL — ABNORMAL HIGH (ref 70–99)
Glucose-Capillary: 180 mg/dL — ABNORMAL HIGH (ref 70–99)
Glucose-Capillary: 182 mg/dL — ABNORMAL HIGH (ref 70–99)
Glucose-Capillary: 160 mg/dL — ABNORMAL HIGH (ref 70–99)

## 2014-03-19 LAB — CBC
HEMATOCRIT: 23.3 % — AB (ref 39.0–52.0)
HEMOGLOBIN: 7.1 g/dL — AB (ref 13.0–17.0)
MCH: 26.9 pg (ref 26.0–34.0)
MCHC: 30.5 g/dL (ref 30.0–36.0)
MCV: 88.3 fL (ref 78.0–100.0)
Platelets: 577 10*3/uL — ABNORMAL HIGH (ref 150–400)
RBC: 2.64 MIL/uL — AB (ref 4.22–5.81)
RDW: 16.8 % — AB (ref 11.5–15.5)
WBC: 29.5 10*3/uL — AB (ref 4.0–10.5)

## 2014-03-19 MED ORDER — VITAMINS A & D EX OINT
TOPICAL_OINTMENT | CUTANEOUS | Status: AC
  Filled 2014-03-19: qty 5

## 2014-03-21 ENCOUNTER — Other Ambulatory Visit: Payer: Self-pay | Admitting: Family Medicine

## 2014-03-21 ENCOUNTER — Ambulatory Visit: Payer: Medicare Other

## 2014-03-22 ENCOUNTER — Ambulatory Visit: Payer: Medicare Other

## 2014-03-23 ENCOUNTER — Ambulatory Visit: Payer: Medicare Other

## 2014-03-24 ENCOUNTER — Ambulatory Visit: Payer: Medicare Other

## 2014-03-24 ENCOUNTER — Telehealth: Payer: Self-pay | Admitting: Family Medicine

## 2014-03-24 NOTE — Telephone Encounter (Signed)
Caller name: Aris Everts Relation to pt: daughter Call back number:  [phone] Pharmacy:  Reason for call:   Patient daughter, Mariann Laster wants to know why patient was never told that he had stage IV cancer.

## 2014-03-24 NOTE — Progress Notes (Signed)
  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   Indication for treatment:  Palliation of airway obstruction         Radiation treatment dates:   03/15/2014-03/17/2014  Site/dose:   He received 9 Gy in 3 fractions of 3 Gy  Beams/energy:   Anterior and posterior 10 MV X-rays were used.  Narrative: The patient tolerated radiation treatment relatively well.   The patient was ventilator dependent upon beginning treatment. It was hoped that a course of 10 fractions may help him improve his airway to be weaned from the ventilator. Unfortunately, his status continued to decline during radiation. Radiation was discontinued after 3 fractions and the patient ultimately expired.  ________________________________  Sheral Apley. Tammi Klippel, M.D.

## 2014-03-24 NOTE — Telephone Encounter (Signed)
Spoke with the Lindell Noe and she stated the Hospital told her that her father did not know he had lung cancer. I made her aware after reviewing his chart he discussed with Dr.Alva on 02/25/14 and the patient had been scheduled with the [facility]. I advised that multiple CT scans and Cxr but the pneumonia caused it to be difficult to see the cancer, it was not until last CT that it was found, she verbalized understanding, she said she guess her father did not want them to know. She did not have any other questions or concerns at this time.     KP

## 2014-03-25 ENCOUNTER — Ambulatory Visit: Payer: Medicare Other | Admitting: Family

## 2014-03-25 ENCOUNTER — Encounter (HOSPITAL_COMMUNITY): Payer: Medicare Other

## 2014-03-25 ENCOUNTER — Ambulatory Visit: Payer: Medicare Other

## 2014-03-27 NOTE — Discharge Summary (Signed)
DISCHARGE SUMMARY    Date of admit: 03/13/2014 10:39 PM Date of discharge: Mar 29, 2014  8:20 PM Length of Stay: 14 days  PCP is Garnet Koyanagi, DO   PROBLEM LIST Principal Problem:   Lung mass Active Problems:   Primary cancer of left upper lobe of lung   CKD (chronic kidney disease) stage 3, GFR 30-59 ml/min   Aspergillus pneumonia   Hemoptysis   Malignant neoplasm of upper lobe of left lung   HCAP (healthcare-associated pneumonia)   Acute respiratory failure with hypoxia   A-fib   Atrial fibrillation, unspecified   Acute respiratory failure with hypoxemia   SVT (supraventricular tachycardia)   Acute respiratory failure, unspecified whether with hypoxia or hypercapnia   Acute on chronic respiratory failure   DNR (do not resuscitate) discussion   Palliative care encounter    SUMMARY [name] [name] was 69 y.o. patient with    has a past medical history of Peripheral vascular disease; Pancreatitis; Hypertension; Glaucoma; COPD (chronic obstructive pulmonary disease); Ulcer; H/O alcohol abuse; History of tobacco abuse; PONV (postoperative nausea and vomiting); Anemia; GERD (gastroesophageal reflux disease); Carpal tunnel syndrome, bilateral; Colon polyps (12/2011); Prostate carcinoma; Lung cancer, upper lobe (2007); Aspergillus pneumonia; and Necrotizing pneumonia.   has past surgical history that includes bilateral common iliac and external iliac angioplasty and stenting; Epigastric hernia repair (12/20/2001); Anterior cervical decomp/discectomy fusion; Chalazion excision (03/13/2011); Eye surgery (2013); Lung surgery; Ankle arthroplasty; Vascular surgery; Foot surgery; Cardiac catheterization; Femoral-popliteal Bypass Graft (Left, 09/09/2012); Endarterectomy femoral (Left, 09/09/2012); Patch angioplasty (Left, 09/09/2012); Video bronchoscopy (Bilateral, 09/16/2013); and Video bronchoscopy (Bilateral, 03/13/2014).   Admitted on 03/21/2014 with : 69 y/o M with known enlarging mass in  LUL (Rx for aspergillus disease) admitted to [facility] on 2/6 with hemoptysis, LUL cavitary lesion with mediastinal invasion.  SIGNIFICANT EVENTS:  2/08 PCCM consulted for evaluation 2/09 Bronchoscopy> mucus plugging LUL, mass left upper lobe, biopsy, brushing, washing; biopsy with high grade, poorly differentiated carcinoma 2/11 Intubated 2/13 High residuals, TF held 2/15 Radiation mapping completed 2/16 On neo 110 mcg, fent 255mcg, did not tolerate PEEP 10/80%  2/17 Day 2 of Radiation therapy, on neo, fent, PEEP 14/100% 2/18 Desaturations overnight, required bagging 03/18/14: More desaturations overnight 2/18-19 . More ectopy. High residuals, firm abdomen  03/29/2014 : 100% fio2, 10 peep, APRV , desaturated to 60s with lateral movement. Otherwise pulse ox mid 90s. On neo 152mcg. Worsening ATN; almost anuric. On fent gtt and prn versed -> RASS -3  Course of illness was one of progressive multi-organ failure and decline. At some point goals of care daughter made him DNAR. On 2014/03/29 he developed refractory circulatory shock and died  SIGNED Dr. Brand Males, M.D., [facility].C.P Pulmonary and Critical Care Medicine Staff Physician [name] [facility] Pager: [phone], If no answer or between  15:00h - 7:00h: call [phone]  03/27/2014 7:47 PM

## 2014-03-28 ENCOUNTER — Ambulatory Visit: Payer: Medicare Other

## 2014-03-28 LAB — FUNGUS CULTURE W SMEAR: FUNGAL SMEAR: NONE SEEN

## 2014-03-29 ENCOUNTER — Ambulatory Visit: Payer: Medicare Other

## 2014-03-29 NOTE — H&P (Signed)
Patient expired at 16:37. Absence of breath sounds verified by this RN and Leola Brazil, RN. Family called and at bedside ~10 minutes after death. Patient's belongings including cell phone, jacket, glasses, and clothing given to family member. MD notified.

## 2014-03-29 NOTE — Progress Notes (Signed)
BP soft with SBP in 80s and MAP in 50s. Neo maxed at 200 mcg. Dr. Chase Caller notified: will not escalate pressors. Leave neo as is.

## 2014-03-29 NOTE — Progress Notes (Signed)
Turned patient to left side - desat to low 70s. Took several minutes to recover after turning back to right side.

## 2014-03-29 NOTE — Progress Notes (Signed)
PULMONARY / CRITICAL CARE MEDICINE   Name: [name] [name] MRN: [id number] DOB: 12-Feb-1945    ADMISSION DATE:  03/03/2014 CONSULTATION DATE:  03/05/14  REFERRING MD :  Dr. Alcario Drought / [facility]   CHIEF COMPLAINT:  hemoptysis  INITIAL PRESENTATION: 69 y/o M with known enlarging mass in LUL (Rx for aspergillus disease) admitted to [facility] on 2/6 with hemoptysis, LUL cavitary lesion with mediastinal invasion.  STUDIES:  2/05  CXR >> LUL cavitary lseion and mediastinal invasion  SIGNIFICANT EVENTS:   2/06  Admitted with hemoptysis 2/08  PCCM consulted for evaluation 2/09  Bronchoscopy> mucus plugging LUL, mass left upper lobe, biopsy, brushing, washing; biopsy with high grade, poorly differentiated carcinoma 2/11  Intubated 2/13  High residuals, TF held 2/15  Radiation mapping completed 2/16  On neo 110 mcg, fent 222mg, did not tolerate PEEP 10/80%   2/17  Day 2 of Radiation therapy, on neo, fent, PEEP 14/100% 2/18  Desaturations overnight, required bagging 03/18/14: More desaturations overnight 2/18-19 . More ectopy. High residuals, firm abdomen   SUBJECTIVE/OVERNIGHT/INTERVAL HX  03/15/2014-03-04: 100% fio2, 10 peep, APRV , desaturated to 60s with lateral movement. Otherwise pulse ox mid 90s. On neo 1876m.  Worsening ATN; almost anuric. On fent gtt and prn versed -> RASS -3 VITAL SIGNS: Temp:  [97.8 F (36.6 C)-103.1 F (39.5 C)] 97.8 F (36.6 C) (02/20 0830) Pulse Rate:  [78-100] 82 (02/20 0308) Resp:  [10-29] 18 (02/20 1023) BP: (77-152)/(38-78) 84/56 mmHg (02/20 1015) SpO2:  [76 %-100 %] 83 % (02/20 1023) FiO2 (%):  [100 %] 100 % (02/20 0805)   HEMODYNAMICS:     VENTILATOR SETTINGS: Vent Mode:  [-] Bi-Vent FiO2 (%):  [100 %] 100 % Pressure Support:  [5 cmH20] 5 cmH20 Plateau Pressure:  [22 cmH20] 22 cmH20   INTAKE / OUTPUT:  Intake/Output Summary (Last 24 hours) at 0203/04/2016114 Last data filed at 02Mar 04, 2016000  Gross per 24 hour  Intake 2854.1 ml  Output    275 ml  Net 2579.1  ml   PHYSICAL EXAMINATION: General:  Cachectic male on vent HEENT: [place]/AT, ETT in place PULM: diminished LUL, coarse on R CV: s1s2, irreg irreg, PAC's noted on monitor, underlying SR, JVD noted Ab: BS+, abd more firm 2/19 Ext: warm, no edema, RUE swelling Neuro: sedate, periods of intermittent agitation alternating with wakefulness   LABS: PULMONARY  Recent Labs Lab 03/12/14 1232 03/13/14 0358 03/14/14 0232 03/15/14 1800 03/16/14 0920  PHART 7.340* 7.352 7.351 7.273* 7.264*  PCO2ART 39.8 36.1 34.8* 43.3 47.7*  PO2ART 64.3* 69.9* 54.1* 67.3* 57.3*  HCO3 20.9 19.5* 18.8* 19.3* 20.8  TCO2 18.5 18.5 18.0 19.0 20.1  O2SAT 91.1 93.4 85.9 90.0 84.9    CBC  Recent Labs Lab 03/17/14 0500 03/18/14 0430 [id number]  HGB 7.1* 6.9* 7.1*  HCT 22.4* 21.7* 23.3*  WBC 35.1* 30.4* 29.5*  PLT 573* 623* 577*    COAGULATION No results for input(s): INR in the last 168 hours.  CARDIAC  No results for input(s): TROPONINI in the last 168 hours. No results for input(s): PROBNP in the last 168 hours.   CHEMISTRY  Recent Labs Lab 03/13/14 0500 03/14/14 0505 03/15/14 0511 03/16/14 0500 03/17/14 0500 03/18/14 0430 03/18/2014/03/04353  NA 137 134* 138 133* 139 138 134*  K 3.9 3.7 3.5 3.6 4.2 4.4 4.4  CL 109 104 110 107 110 110 106  CO2 21 21 21 22 23 21 21   GLUCOSE 136* 100* 93 120* 115* 137* 142*  BUN 24* 26* 27* 26* 32* 45* 51*  CREATININE 1.75* 1.65* 1.65* 1.64* 1.94* 2.82* 3.38*  CALCIUM 8.0* 8.1* 8.1* 8.1* 7.9* 7.9* 8.0*  MG 1.7 1.8 1.9 1.8  --   --   --   PHOS 2.9 2.9  --   --   --   --   --    Estimated Creatinine Clearance: 19.4 mL/min (by C-G formula based on Cr of 3.38).   LIVER No results for input(s): AST, ALT, ALKPHOS, BILITOT, PROT, ALBUMIN, INR in the last 168 hours.   INFECTIOUS No results for input(s): LATICACIDVEN, PROCALCITON in the last 168 hours.   ENDOCRINE CBG (last 3)   Recent Labs  03/18/14 1954 03/18/14 2310 04-07-2014 0454  GLUCAP 141*  134* 140*         IMAGING x48h Dg Chest Port 1 View  03/18/2014   CLINICAL DATA:  Respiratory failure  EXAM: PORTABLE CHEST - 1 VIEW  COMPARISON:  03/17/2014  FINDINGS: Endotracheal tube tip is between the clavicular heads and carina. Right jugular central line extends into the SVC. Nasogastric tube and side port are below the diaphragm, extending off the inferior edge of the image. Complete opacification of the upper left hemithorax persists. Diffuse airspace opacities persist throughout the remainder of both lungs. Left effusion persist.  IMPRESSION: Extensive airspace opacities persist bilaterally without significant interval change.   Electronically Signed   By: Andreas Newport M.D.   On: 03/18/2014 06:54   Dg Abd Portable 1v  03/18/2014   CLINICAL DATA:  Rule out ileus.  Feeding tube backing up.  EXAM: PORTABLE ABDOMEN - 1 VIEW  COMPARISON:  10/26/2012 abdominal CT  FINDINGS: The bowel gas pattern is normal. A nasogastric tube tip overlaps the upper stomach.  Epigastric calcifications correlating with history of chronic pancreatitis and stones.  No concerning intra-abdominal mass effect.  Bilateral iliac artery stenting.  IMPRESSION: 1. Normal bowel gas pattern. 2. Nasogastric tube tip at the upper stomach.   Electronically Signed   By: Monte Fantasia M.D.   On: 03/18/2014 11:53        ASSESSMENT / PLAN:  RESPIRATORY:  A: Acute respiratory failure with hypoxemia - due to HCAP, COPD, LUL Mass, pulmonary edema. Large left upper lobe mass - s/p bronchoscopy 2/8 with high grade poorly differentiated carcinoma. 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   P:   Heme/onc consulted >> proceeding with radiation, 10 treatments planned, D3/10; holding on 2/19 due to instability Treat for HCAP, see ID Wean PEEP/FiO2 for saturations > 80%, suspect that intrathoracic pressures are  impacting BP; have been unable to wean  Consider diuresis once he hemodynamically stabilizes. PRN albuterol  Continue APRV for now Pt indicated at the time of intubation that he would accept MV for a week. If no progress through this weekend then would honor his wishes and push for palliative extubation.   CV: A: Afib with RVR - improved on Cardizem gtt (stopped 2/14) Shock - likely sedation related.  ECHO with normal EF.  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 HCAP  Hx mycobacterial disease (M gordonae) P:   BCx2 2/6 >> negative BCx2 2/9 >> neg Resp 2/6 >> OPF BAL 2/8 >> OPF BAL fungal 2/8 >> yeast  Abx:  Voriconazole (home med, cont indefinitely) >> Vanc 2/9, D10/10 on 2/18 Zosyn 2/9, D10/10 on 2/18  GI:  A: Severe Protein Calorie Malnutrition  High Gastric Residuals - noted 2/13, TF restarted 2/15 but not tolerating Suspect ileus P:  Hold TF for now KUB ordered 2/19 PPI Reglan reordered 2/19  NEURO:  A: Pain - ICU associated discomfort P:  Fentanyl gtt for pain, max to 400 mcg  PRN Versed for sedation  Goal RASS:  -2 Seroquel 25 mg QD  ENDOCRINE:  A: Mild Hyperglycemia - range 110 to 130 P: Monitor glucose on BMP, if consistently greater than 180 add SSI   GLOBAL: Palliative Care met with daughter [name] [name] - continue full medical and vent support, no escalation of care, DNR if arrests. [name] [name] understands that he is not going to survive this illness but is having difficul;ty with the responsibility of representing several family voices and  with the burden of withdrawal of care. I believe she would actually be relieved if he were to decline and die on his own because she realizes he is suffering. Note that the pt himself indicated at the time of intubation that he would only want this for 1 week. We will NOT escalate care in any way. Will need to broach subject of withdrawal if he doesn't decline on his own over the weekend. I do not believe he is stable for XRT today, may not be able to get any more. He is not an HD candidate.    Dr Chase Caller IDT meet  With daughter [name] [name], ex wife in presence of Shae RN - [name] [name] expalains being only dtr adn strong father daughte bond and her birthday coming up [birth year] she is hoping for miracle and clinging on to small facts that might show improvement. She clearly states "no veegetable" and no CPR. Continue medical care. She feels reassured that medical team is trying their best     The patient is critically ill with multiple organ systems failure and requires high complexity decision making for assessment and support, frequent evaluation and titration of therapies, application of advanced monitoring technologies and extensive interpretation of multiple databases.   Critical Care Time devoted to patient care services described in this note is  40  Minutes. This time reflects time of care of this signee Dr Brand Males. This critical care time does not reflect procedure time, or teaching time or supervisory time of PA/NP/Med student/Med Resident etc but could involve care discussion time    Dr. Brand Males, M.D., [facility].C.P Pulmonary and Critical Care Medicine Staff Physician [place] [facility] Pager: [phone], If no answer or between  15:00h - 7:00h: call [phone]  03/24/2014 11:38 AM

## 2014-03-29 NOTE — H&P (Signed)
285 mL wasted of Fentanyl (2 - 250 mL bags) 10 mcg/ml. Wasted in sink. Witnessed by Leola Brazil, RN

## 2014-03-29 NOTE — Procedures (Signed)
Extubation Procedure Note  Patient Details:   Name: [name] [name] DOB: 1945-04-11 MRN: [id number]   Airway Documentation:     Evaluation  O2 sats: N/A Complications: none Patient N/A Bilateral Breath Sounds: N/A Suctioning: Oral  Pt expired on vent and was extubated.  Martha Clan 2014-03-26, 5:19 PM

## 2014-03-29 DEATH — deceased

## 2014-03-30 ENCOUNTER — Ambulatory Visit: Payer: Medicare Other

## 2014-04-04 LAB — FUNGUS CULTURE W SMEAR
Fungal Smear: NONE SEEN
SPECIAL REQUESTS: NORMAL

## 2016-01-23 IMAGING — CR DG CHEST 1V PORT
1 series · 1 of 1 positions shown · non-contrast
Comparison: Portable chest x-ray March 07, 2014

CLINICAL DATA: Respiratory failure, shortness of breath ; history
of long and prostate malignancy and Aspergillus pneumonia.

EXAM:
PORTABLE CHEST - 1 VIEW

[AP]
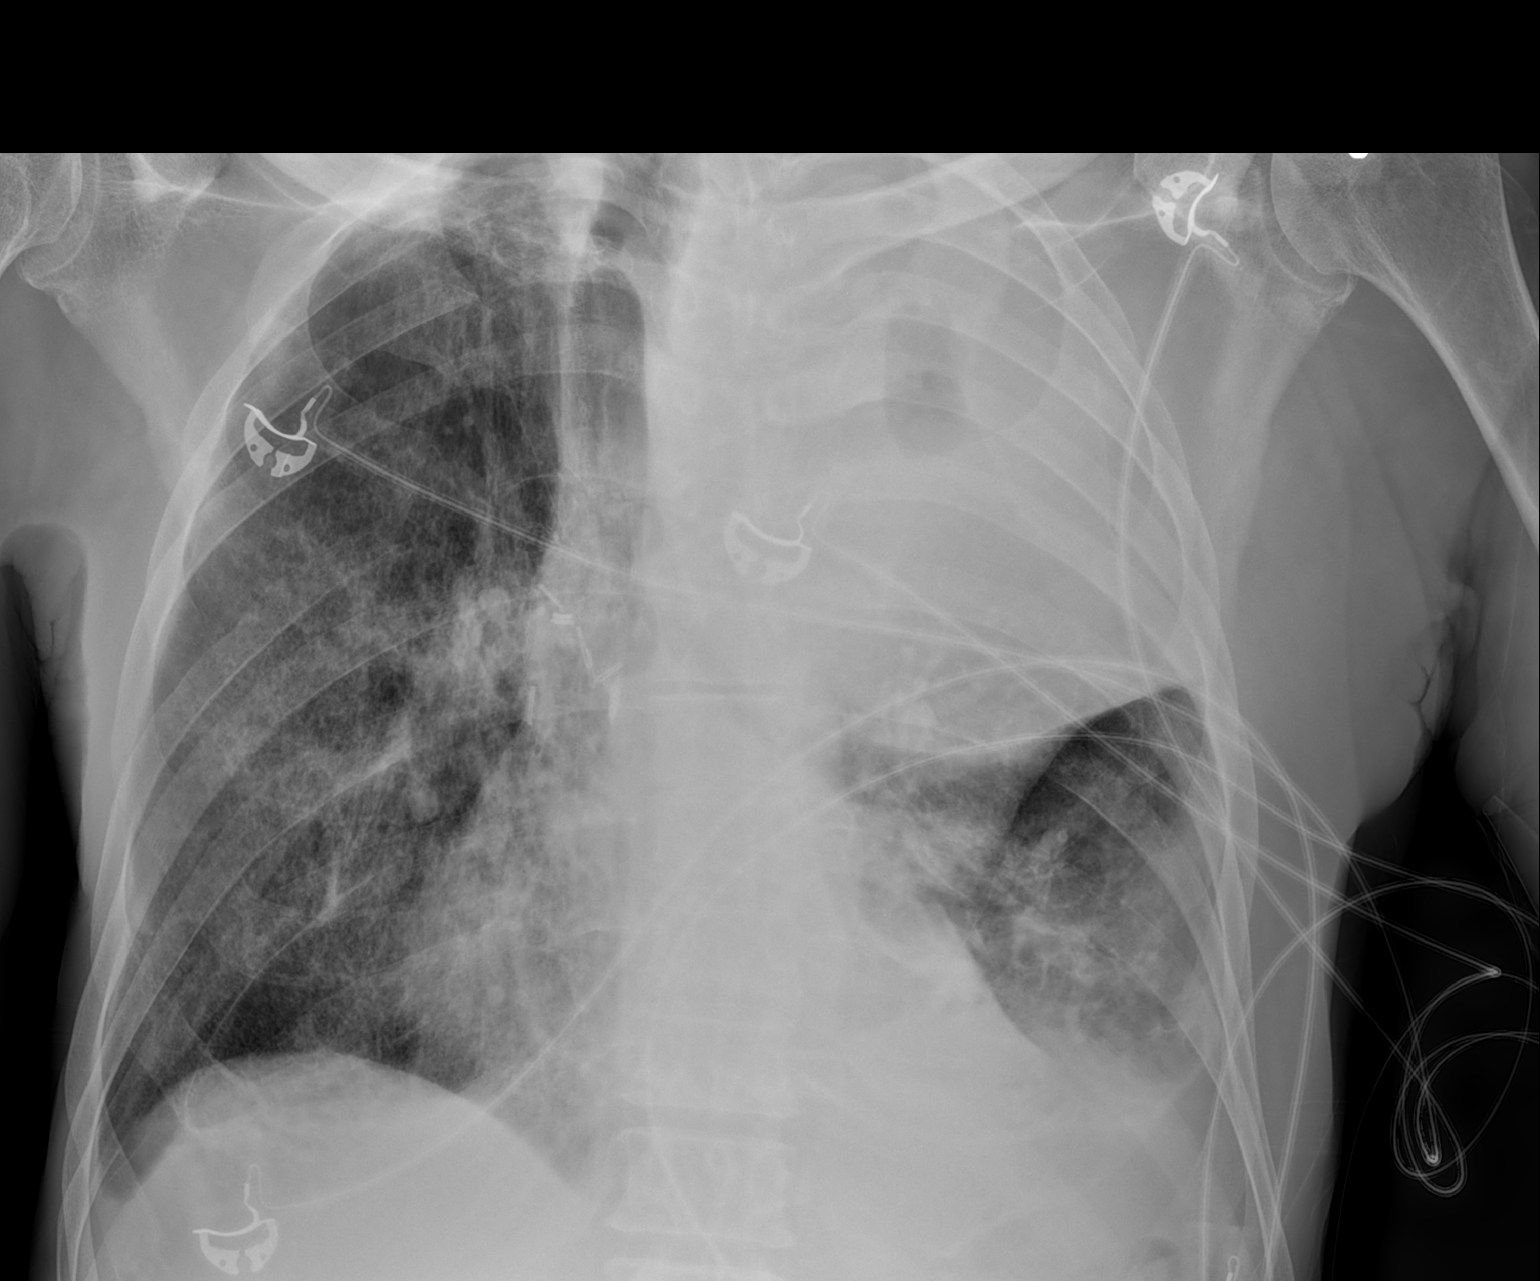

[1 of 1 positions shown; findings below may reference images not displayed]

FINDINGS: The right lung remains well-expanded. There is stable right apical
pleural thickening and stable increased interstitial density in the
lung. On the left there is increasing density in the upper [DATE] of
the hemi thorax with mild displacement of the trachea toward the
right. There remains a small amount of aerated right lower lung.
There is partial obscuration of the hemidiaphragm. The cardiac
silhouette is normal in size. The pulmonary vascularity is not
engorged. The observed bony thorax is unremarkable.
IMPRESSION: 1. Increasing opacification of the right mid and upper hemi thorax
with evidence of mild displacement of the trachea toward the right.
This is consistent with progressive atelectasis and/or pneumonia.
There is a known left upper lobe mass.
2. Stable mildly increased interstitial density in the right lung.

## 2016-01-24 IMAGING — DX DG CHEST 1V PORT
1 series · 1 of 1 positions shown · non-contrast
Comparison: Study obtained earlier in the day

CLINICAL DATA: Hypoxia with intubation

EXAM:
PORTABLE CHEST - 1 VIEW

[chest ap]
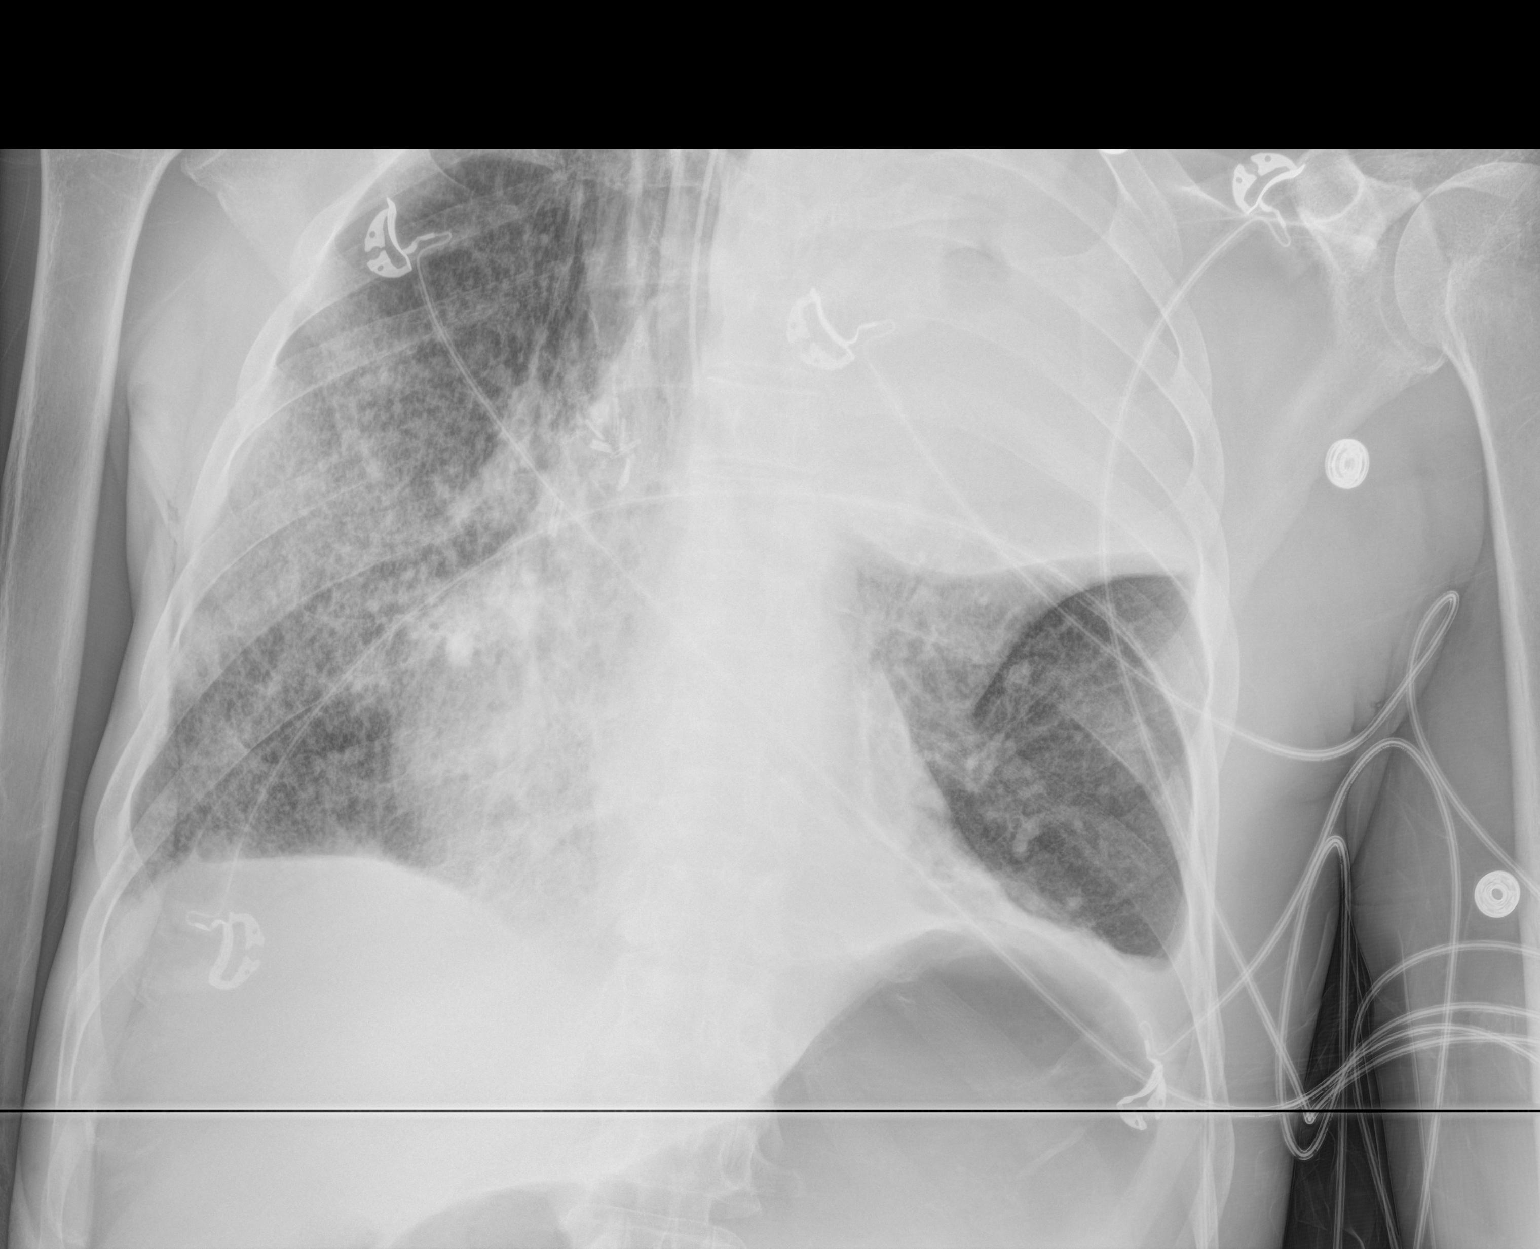

[1 of 1 positions shown; findings below may reference images not displayed]

FINDINGS: Endotracheal tube tip is 1.4 cm above the carina. No pneumothorax.
The large left upper lobe mass which contains a central area of air
remains without change. There is widespread edema, stable. No new
opacity. Heart is upper normal with grossly normal pulmonary
vascularity on the right an obscured pulmonary vascularity on the
left. There are surgical clips in the right perihilar region.
Adenopathy is better seen on recent CT than on this study. No bone
lesions. There is mild dilatation of the stomach with air.
IMPRESSION: Endotracheal tip 1.4 cm above the carina. No pneumothorax. Otherwise
no appreciable change compared to study obtained earlier in the day.

## 2016-01-25 IMAGING — DX DG CHEST 1V PORT
1 series · 1 of 1 positions shown · non-contrast
Comparison: 03/10/2014.  10/03/2013.  CT 02/25/2014.

CLINICAL DATA: Respiratory failure.

EXAM:
PORTABLE CHEST - 1 VIEW

[chest ap]
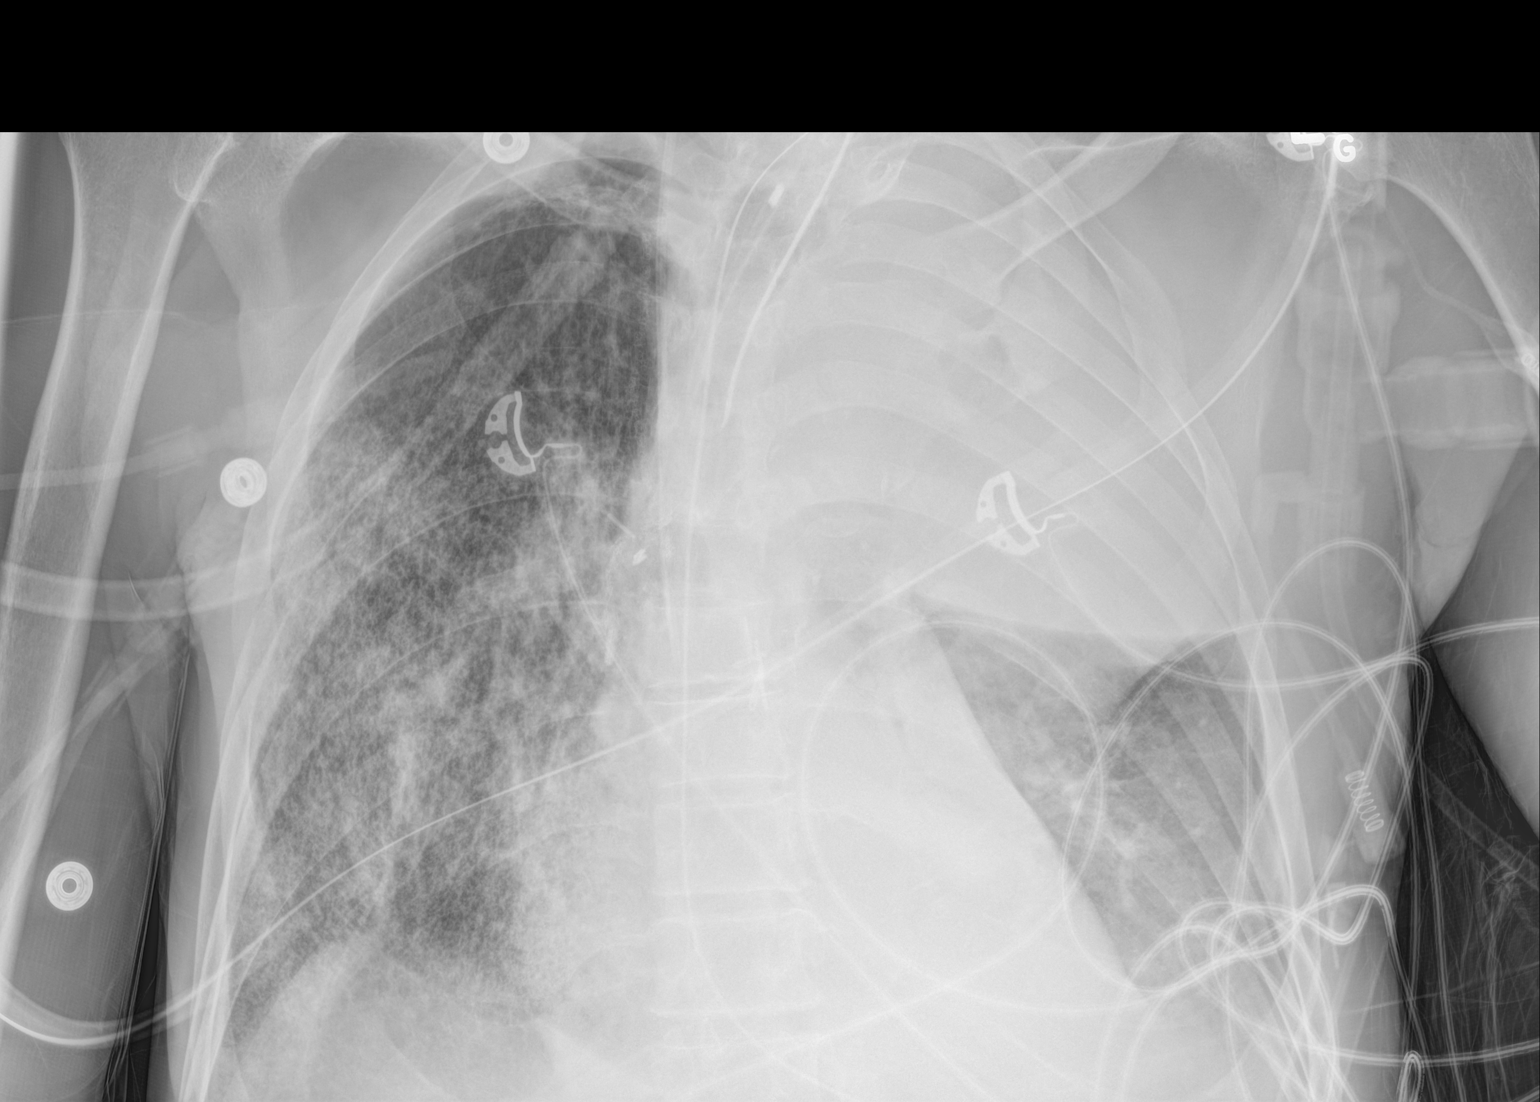

[1 of 1 positions shown; findings below may reference images not displayed]

FINDINGS: Endotracheal tube, right IJ line, and NG tube are in stable
position. Persistent cavitary mass left upper lobe. Persistent
bilateral pulmonary interstitial prominence suggesting persistent
interstitial edema or pneumonitis. Bibasilar atelectasis . Heart
size is stable. No acute bony abnormality.
IMPRESSION: 1. Large cavitary mass left upper lobe unchanged.
2. Persistent bilateral interstitial prominence consistent with
interstitial pulmonary edema or pneumonitis. No change.
3. Lines and tubes in stable position.

## 2016-01-26 IMAGING — DX DG CHEST 1V PORT
1 series · 2 of 2 positions shown · non-contrast
Comparison: 03/11/2014

CLINICAL DATA: Acute respiratory distress

EXAM:
PORTABLE CHEST - 1 VIEW

[Series 1: chest ap · 0.14mm/px · 2 of 2 slices shown]
[im 1/2]
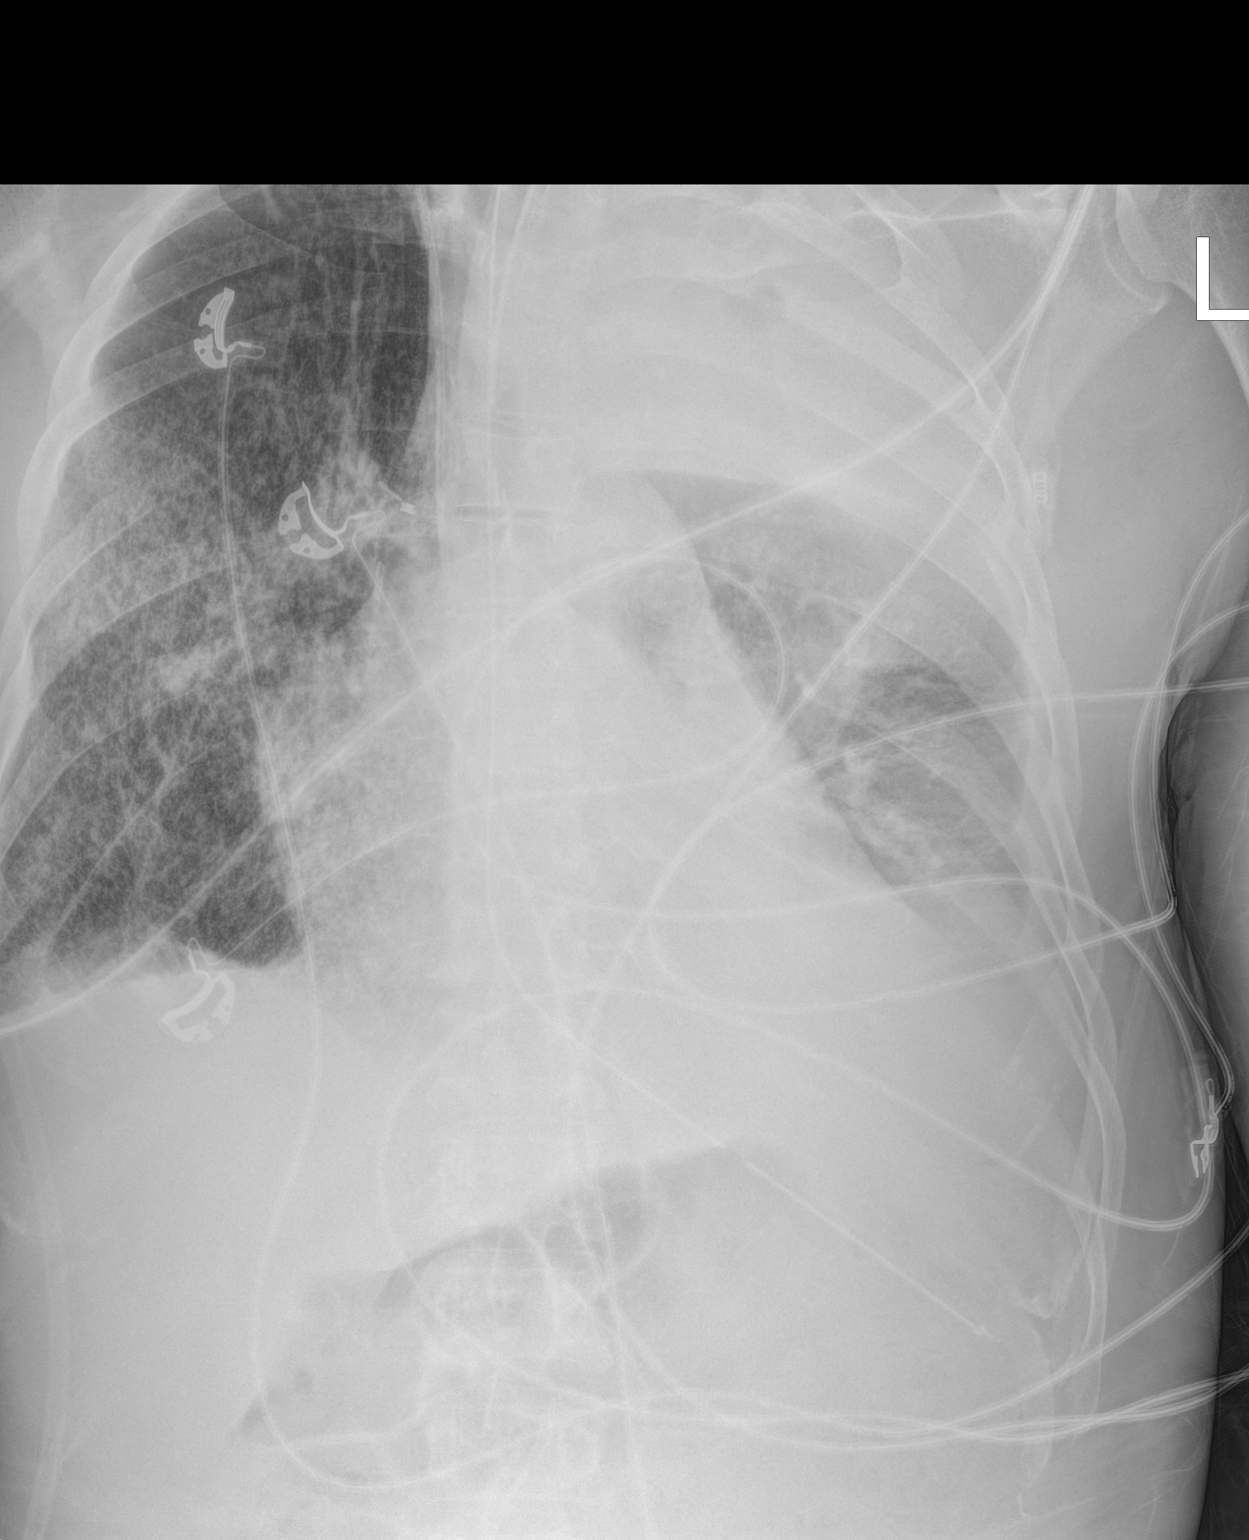
[im 2/2]
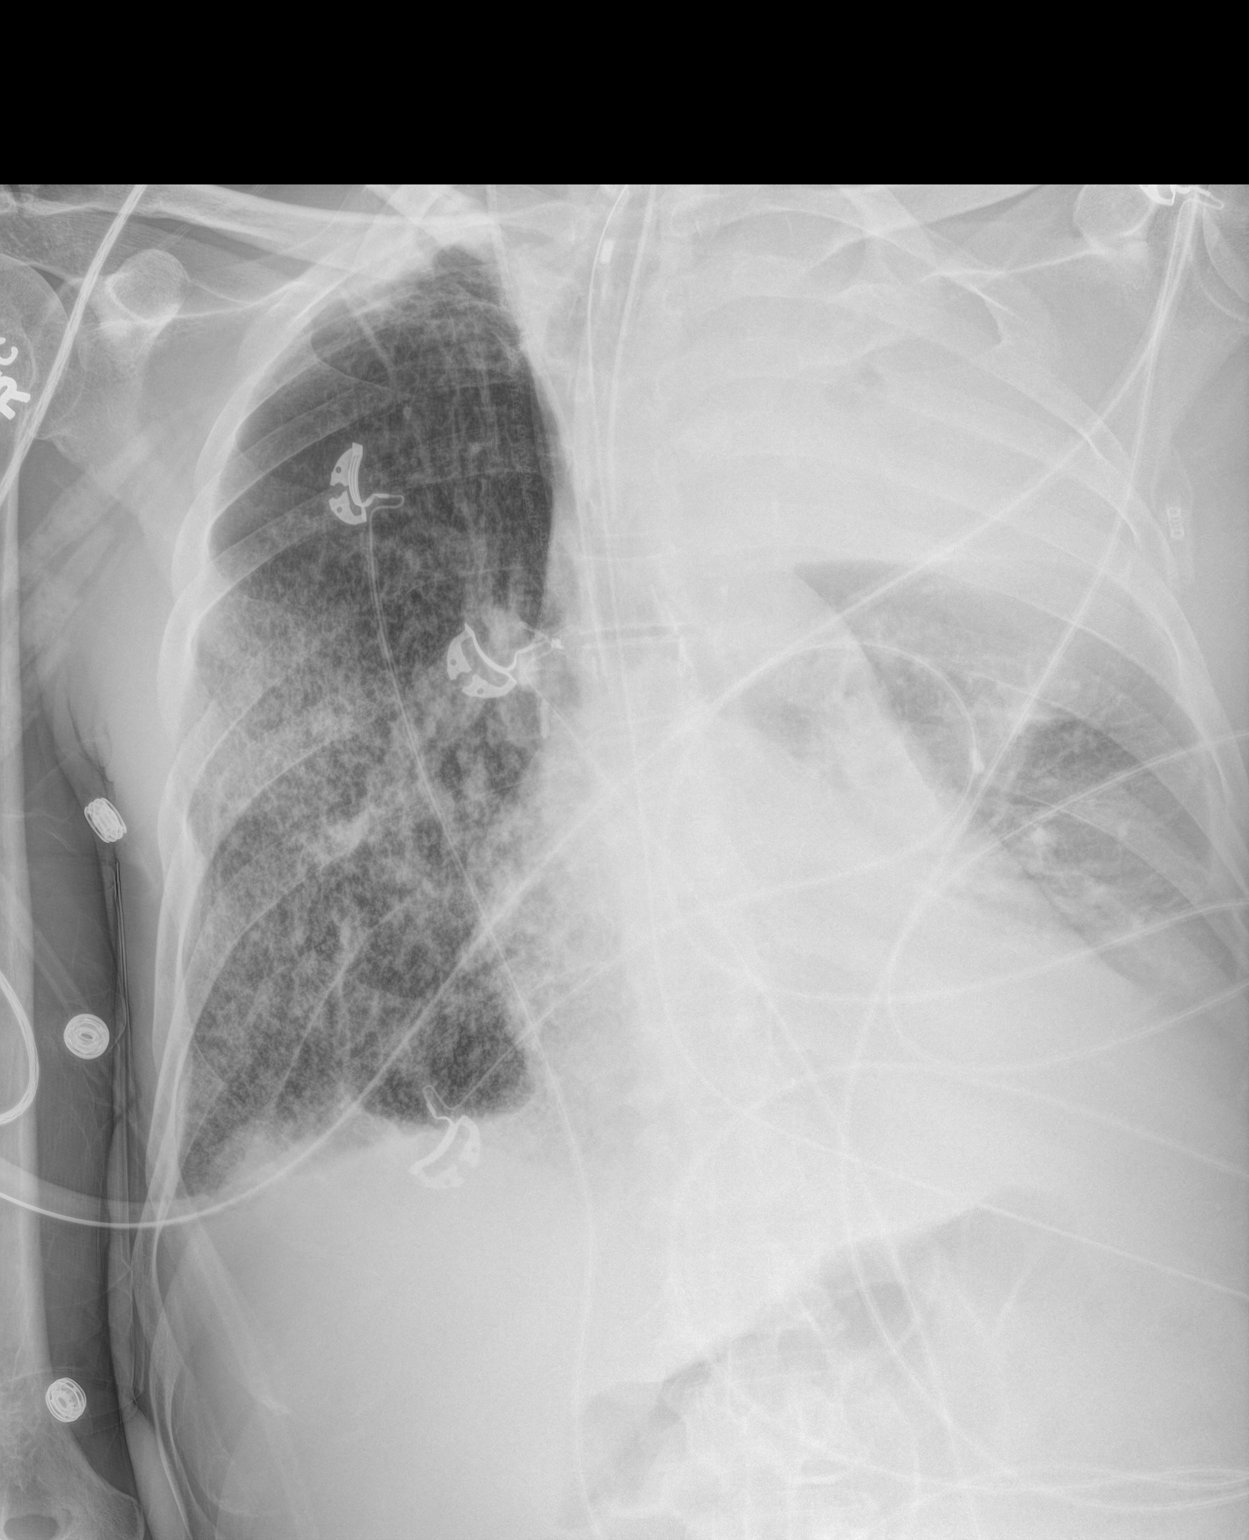

[2 of 2 positions shown; findings below may reference images not displayed]

FINDINGS: Endotracheal tube tip 3 cm above the carina. Gastric suction tube
tip in the proximal stomach. Stable right IJ catheter.

Unchanged left upper lobe cavitary mass and left pleural effusion.
Interstitial opacities asymmetric to the right mid and lower lung
are stable. No air leak.
IMPRESSION: 1. Stable positioning of tubes and central line.
2. Stable interstitial and airspace disease which could reflect
infection or edema asymmetric in the setting of emphysema.
3. Left upper lobe cavitary mass with surrounding collapse.

## 2016-01-28 IMAGING — CR DG CHEST 1V PORT
1 series · 1 of 1 positions shown · non-contrast
Comparison: 03/13/2014

CLINICAL DATA: Endotracheal tube

EXAM:
PORTABLE CHEST - 1 VIEW

[AP]
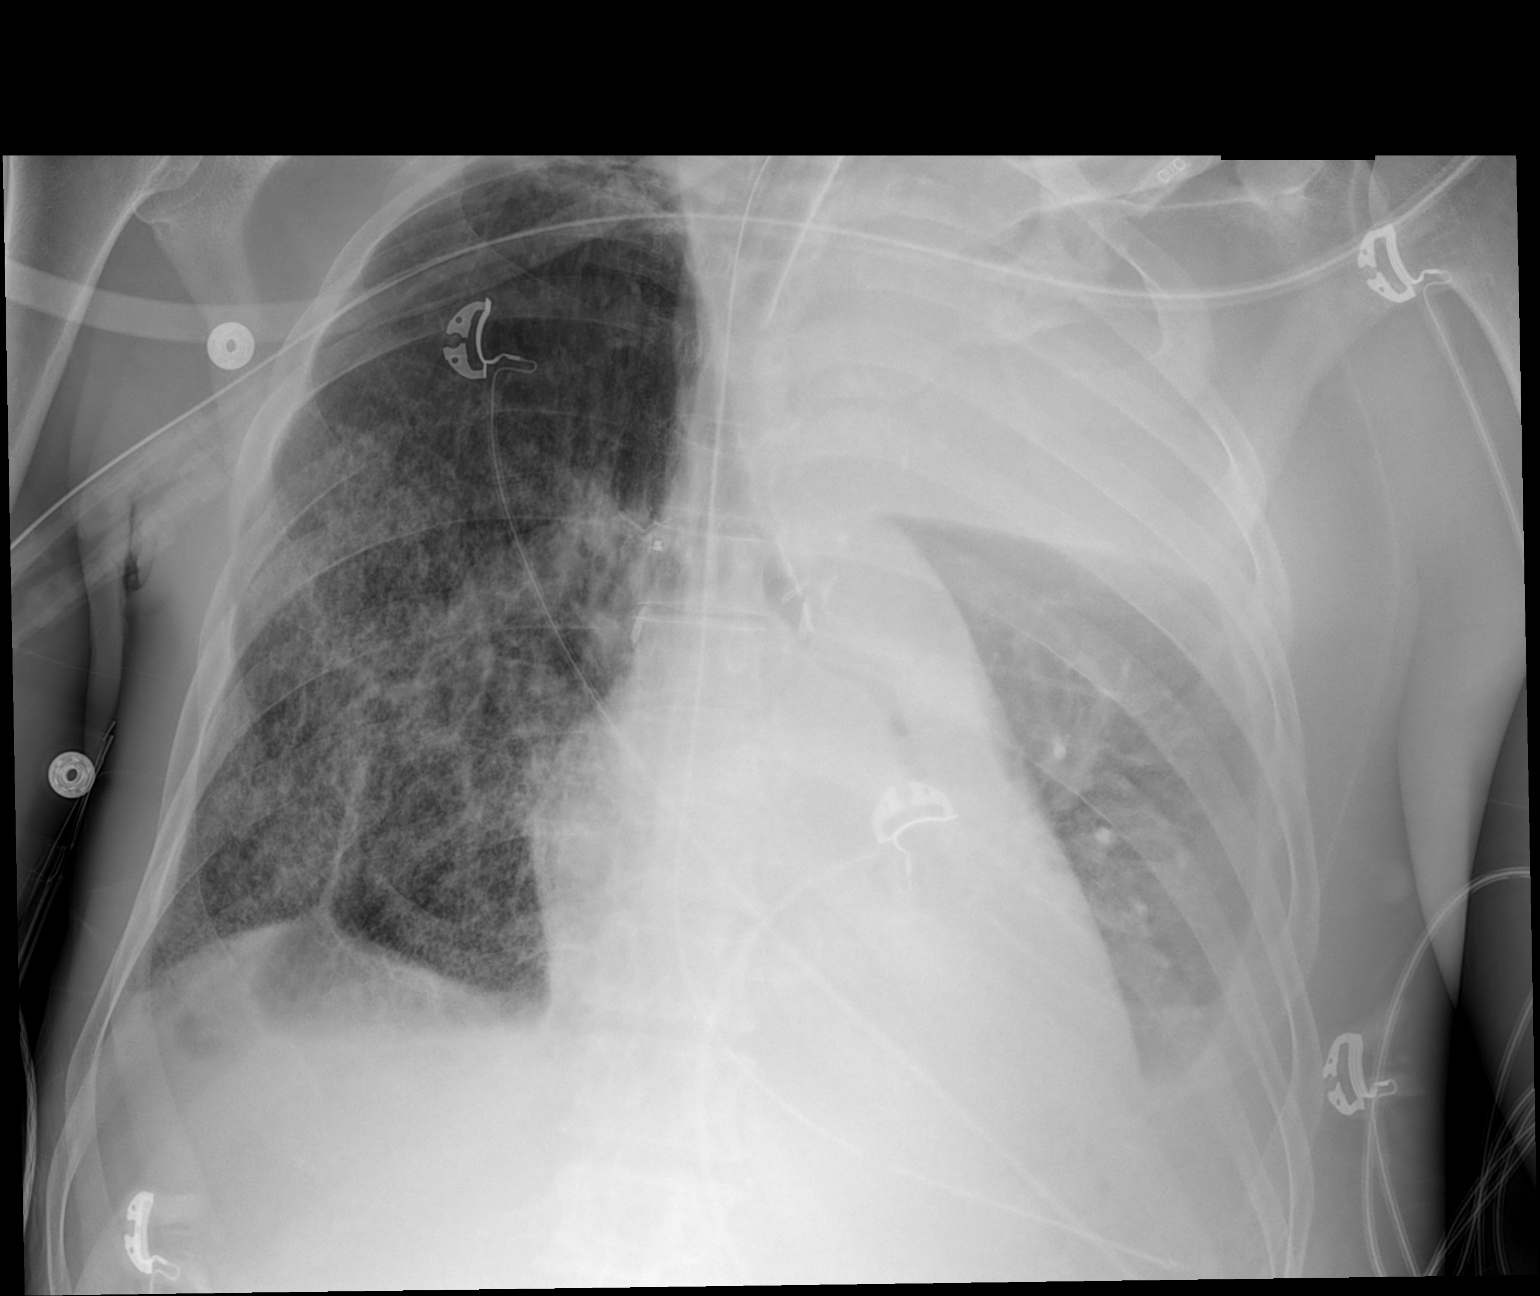

[1 of 1 positions shown; findings below may reference images not displayed]

FINDINGS: The endotracheal tube tip is midway between the clavicular heads and
the carina. The nasogastric tube extends into the stomach. There is
a right jugular central line extending into the SVC. There is
persistent left pleural fluid and persistent opacification of the
upper left hemithorax due to the large mass. Diffuse interstitial
opacities also persist bilaterally.
IMPRESSION: No significant interval change
